# Patient Record
Sex: Female | Born: 1940 | Race: White | Hispanic: No | Marital: Single | Smoking: Never smoker
Health system: Southern US, Community
[De-identification: ages and names within clinical notes are randomized; demographics above are authoritative.]

## PROBLEM LIST (undated history)

## (undated) DIAGNOSIS — I10 Essential (primary) hypertension: Secondary | ICD-10-CM

## (undated) DIAGNOSIS — Z862 Personal history of diseases of the blood and blood-forming organs and certain disorders involving the immune mechanism: Secondary | ICD-10-CM

## (undated) DIAGNOSIS — F419 Anxiety disorder, unspecified: Secondary | ICD-10-CM

## (undated) DIAGNOSIS — M1990 Unspecified osteoarthritis, unspecified site: Secondary | ICD-10-CM

## (undated) DIAGNOSIS — N751 Abscess of Bartholin's gland: Secondary | ICD-10-CM

## (undated) DIAGNOSIS — E7800 Pure hypercholesterolemia, unspecified: Secondary | ICD-10-CM

## (undated) HISTORY — PX: BACK SURGERY: SHX140

## (undated) HISTORY — DX: Abscess of Bartholin's gland: N75.1

## (undated) HISTORY — DX: Unspecified osteoarthritis, unspecified site: M19.90

## (undated) HISTORY — DX: Anxiety disorder, unspecified: F41.9

## (undated) HISTORY — PX: BREAST EXCISIONAL BIOPSY: SUR124

## (undated) HISTORY — PX: BREAST SURGERY: SHX581

## (undated) HISTORY — PX: EYE SURGERY: SHX253

## (undated) HISTORY — PX: DIAGNOSTIC LAPAROSCOPY: SUR761

## (undated) HISTORY — DX: Pure hypercholesterolemia, unspecified: E78.00

## (undated) HISTORY — PX: COLONOSCOPY: SHX174

## (undated) HISTORY — PX: BUNIONECTOMY: SHX129

## (undated) HISTORY — PX: TONSILLECTOMY AND ADENOIDECTOMY: SUR1326

## (undated) HISTORY — DX: Essential (primary) hypertension: I10

## (undated) HISTORY — PX: CHOLECYSTECTOMY: SHX55

---

## 1969-06-06 HISTORY — PX: OTHER SURGICAL HISTORY: SHX169

## 1998-12-14 ENCOUNTER — Ambulatory Visit (HOSPITAL_COMMUNITY): Admission: RE | Admit: 1998-12-14 | Discharge: 1998-12-14 | Payer: Self-pay | Admitting: Internal Medicine

## 1999-01-27 ENCOUNTER — Ambulatory Visit (HOSPITAL_COMMUNITY): Admission: RE | Admit: 1999-01-27 | Discharge: 1999-01-27 | Payer: Self-pay | Admitting: Internal Medicine

## 1999-05-21 ENCOUNTER — Ambulatory Visit (HOSPITAL_COMMUNITY): Admission: RE | Admit: 1999-05-21 | Discharge: 1999-05-21 | Payer: Self-pay | Admitting: Gastroenterology

## 1999-11-30 ENCOUNTER — Encounter: Admission: RE | Admit: 1999-11-30 | Discharge: 1999-11-30 | Payer: Self-pay | Admitting: Obstetrics and Gynecology

## 1999-11-30 ENCOUNTER — Encounter: Payer: Self-pay | Admitting: Obstetrics and Gynecology

## 2000-12-01 ENCOUNTER — Encounter: Admission: RE | Admit: 2000-12-01 | Discharge: 2000-12-01 | Payer: Self-pay | Admitting: Obstetrics and Gynecology

## 2000-12-01 ENCOUNTER — Encounter: Payer: Self-pay | Admitting: Obstetrics and Gynecology

## 2000-12-08 ENCOUNTER — Encounter: Payer: Self-pay | Admitting: Obstetrics and Gynecology

## 2000-12-08 ENCOUNTER — Encounter: Admission: RE | Admit: 2000-12-08 | Discharge: 2000-12-08 | Payer: Self-pay | Admitting: Obstetrics and Gynecology

## 2000-12-11 ENCOUNTER — Encounter: Payer: Self-pay | Admitting: Obstetrics and Gynecology

## 2000-12-11 ENCOUNTER — Encounter: Admission: RE | Admit: 2000-12-11 | Discharge: 2000-12-11 | Payer: Self-pay | Admitting: Obstetrics and Gynecology

## 2001-11-19 ENCOUNTER — Encounter: Admission: RE | Admit: 2001-11-19 | Discharge: 2001-11-19 | Payer: Self-pay | Admitting: Family Medicine

## 2001-11-19 ENCOUNTER — Encounter: Payer: Self-pay | Admitting: Family Medicine

## 2002-02-25 ENCOUNTER — Encounter: Admission: RE | Admit: 2002-02-25 | Discharge: 2002-04-02 | Payer: Self-pay | Admitting: Orthopedic Surgery

## 2002-02-27 ENCOUNTER — Encounter: Admission: RE | Admit: 2002-02-27 | Discharge: 2002-02-27 | Payer: Self-pay | Admitting: Obstetrics and Gynecology

## 2002-02-27 ENCOUNTER — Encounter: Payer: Self-pay | Admitting: Obstetrics and Gynecology

## 2003-04-15 ENCOUNTER — Encounter: Admission: RE | Admit: 2003-04-15 | Discharge: 2003-04-15 | Payer: Self-pay | Admitting: Obstetrics and Gynecology

## 2004-09-30 ENCOUNTER — Encounter: Admission: RE | Admit: 2004-09-30 | Discharge: 2004-09-30 | Payer: Self-pay | Admitting: Obstetrics and Gynecology

## 2005-06-21 ENCOUNTER — Ambulatory Visit (HOSPITAL_COMMUNITY): Admission: RE | Admit: 2005-06-21 | Discharge: 2005-06-21 | Payer: Self-pay | Admitting: Neurological Surgery

## 2005-12-02 ENCOUNTER — Encounter: Admission: RE | Admit: 2005-12-02 | Discharge: 2005-12-02 | Payer: Self-pay | Admitting: Obstetrics and Gynecology

## 2006-09-19 ENCOUNTER — Ambulatory Visit (HOSPITAL_COMMUNITY): Admission: RE | Admit: 2006-09-19 | Discharge: 2006-09-19 | Payer: Self-pay | Admitting: Gastroenterology

## 2006-10-16 ENCOUNTER — Ambulatory Visit (HOSPITAL_COMMUNITY): Admission: RE | Admit: 2006-10-16 | Discharge: 2006-10-16 | Payer: Self-pay | Admitting: Internal Medicine

## 2006-11-17 ENCOUNTER — Observation Stay (HOSPITAL_COMMUNITY): Admission: EM | Admit: 2006-11-17 | Discharge: 2006-11-18 | Payer: Self-pay | Admitting: Emergency Medicine

## 2006-12-01 ENCOUNTER — Ambulatory Visit: Payer: Self-pay | Admitting: Cardiology

## 2006-12-01 ENCOUNTER — Inpatient Hospital Stay (HOSPITAL_COMMUNITY): Admission: EM | Admit: 2006-12-01 | Discharge: 2006-12-03 | Payer: Self-pay | Admitting: Emergency Medicine

## 2006-12-01 ENCOUNTER — Encounter (INDEPENDENT_AMBULATORY_CARE_PROVIDER_SITE_OTHER): Payer: Self-pay | Admitting: Surgery

## 2007-04-25 ENCOUNTER — Ambulatory Visit (HOSPITAL_COMMUNITY): Admission: RE | Admit: 2007-04-25 | Discharge: 2007-04-25 | Payer: Self-pay | Admitting: Neurological Surgery

## 2007-06-21 ENCOUNTER — Encounter: Admission: RE | Admit: 2007-06-21 | Discharge: 2007-06-21 | Payer: Self-pay | Admitting: Obstetrics and Gynecology

## 2007-08-28 ENCOUNTER — Ambulatory Visit (HOSPITAL_COMMUNITY): Admission: RE | Admit: 2007-08-28 | Discharge: 2007-08-28 | Payer: Self-pay | Admitting: Orthopedic Surgery

## 2008-02-21 ENCOUNTER — Encounter: Admission: RE | Admit: 2008-02-21 | Discharge: 2008-04-17 | Payer: Self-pay | Admitting: Orthopedic Surgery

## 2008-02-25 ENCOUNTER — Encounter: Admission: RE | Admit: 2008-02-25 | Discharge: 2008-02-25 | Payer: Self-pay | Admitting: Neurological Surgery

## 2008-04-07 ENCOUNTER — Encounter: Admission: RE | Admit: 2008-04-07 | Discharge: 2008-04-07 | Payer: Self-pay | Admitting: Neurological Surgery

## 2008-06-06 HISTORY — PX: OTHER SURGICAL HISTORY: SHX169

## 2008-06-23 ENCOUNTER — Encounter: Admission: RE | Admit: 2008-06-23 | Discharge: 2008-06-23 | Payer: Self-pay | Admitting: Neurological Surgery

## 2008-07-18 ENCOUNTER — Ambulatory Visit (HOSPITAL_COMMUNITY): Admission: RE | Admit: 2008-07-18 | Discharge: 2008-07-18 | Payer: Self-pay | Admitting: Orthopedic Surgery

## 2008-08-28 ENCOUNTER — Encounter: Admission: RE | Admit: 2008-08-28 | Discharge: 2008-08-28 | Payer: Self-pay | Admitting: Neurological Surgery

## 2008-09-24 ENCOUNTER — Ambulatory Visit (HOSPITAL_BASED_OUTPATIENT_CLINIC_OR_DEPARTMENT_OTHER): Admission: RE | Admit: 2008-09-24 | Discharge: 2008-09-24 | Payer: Self-pay | Admitting: Orthopedic Surgery

## 2008-10-10 ENCOUNTER — Ambulatory Visit (HOSPITAL_COMMUNITY): Admission: RE | Admit: 2008-10-10 | Discharge: 2008-10-10 | Payer: Self-pay | Admitting: Neurological Surgery

## 2008-11-17 ENCOUNTER — Encounter: Admission: RE | Admit: 2008-11-17 | Discharge: 2008-11-17 | Payer: Self-pay | Admitting: Neurological Surgery

## 2008-11-18 ENCOUNTER — Encounter: Admission: RE | Admit: 2008-11-18 | Discharge: 2008-11-18 | Payer: Self-pay | Admitting: Neurological Surgery

## 2009-02-05 ENCOUNTER — Encounter: Admission: RE | Admit: 2009-02-05 | Discharge: 2009-02-05 | Payer: Self-pay | Admitting: Neurological Surgery

## 2009-05-04 ENCOUNTER — Encounter: Admission: RE | Admit: 2009-05-04 | Discharge: 2009-05-04 | Payer: Self-pay | Admitting: Neurological Surgery

## 2009-06-06 HISTORY — PX: OTHER SURGICAL HISTORY: SHX169

## 2009-06-06 HISTORY — PX: LUMBAR FUSION: SHX111

## 2009-06-30 ENCOUNTER — Encounter: Admission: RE | Admit: 2009-06-30 | Discharge: 2009-06-30 | Payer: Self-pay | Admitting: Neurological Surgery

## 2009-08-02 ENCOUNTER — Ambulatory Visit (HOSPITAL_COMMUNITY): Admission: RE | Admit: 2009-08-02 | Discharge: 2009-08-02 | Payer: Self-pay | Admitting: Neurosurgery

## 2009-08-25 ENCOUNTER — Encounter: Admission: RE | Admit: 2009-08-25 | Discharge: 2009-08-25 | Payer: Self-pay | Admitting: Neurological Surgery

## 2009-09-28 ENCOUNTER — Inpatient Hospital Stay (HOSPITAL_COMMUNITY): Admission: RE | Admit: 2009-09-28 | Discharge: 2009-10-02 | Payer: Self-pay | Admitting: Orthopedic Surgery

## 2009-11-23 ENCOUNTER — Ambulatory Visit (HOSPITAL_COMMUNITY): Admission: RE | Admit: 2009-11-23 | Discharge: 2009-11-23 | Payer: Self-pay | Admitting: Neurological Surgery

## 2010-01-07 ENCOUNTER — Encounter: Admission: RE | Admit: 2010-01-07 | Discharge: 2010-01-07 | Payer: Self-pay | Admitting: Obstetrics and Gynecology

## 2010-02-15 ENCOUNTER — Inpatient Hospital Stay (HOSPITAL_COMMUNITY): Admission: RE | Admit: 2010-02-15 | Discharge: 2010-02-23 | Payer: Self-pay | Admitting: Neurological Surgery

## 2010-06-27 ENCOUNTER — Encounter: Payer: Self-pay | Admitting: Gastroenterology

## 2010-06-27 ENCOUNTER — Encounter: Payer: Self-pay | Admitting: Internal Medicine

## 2010-08-19 LAB — BASIC METABOLIC PANEL
BUN: 18 mg/dL (ref 6–23)
BUN: 8 mg/dL (ref 6–23)
CO2: 27 mEq/L (ref 19–32)
CO2: 28 mEq/L (ref 19–32)
Calcium: 8.1 mg/dL — ABNORMAL LOW (ref 8.4–10.5)
Calcium: 9.6 mg/dL (ref 8.4–10.5)
Chloride: 103 mEq/L (ref 96–112)
Chloride: 108 mEq/L (ref 96–112)
Creatinine, Ser: 0.61 mg/dL (ref 0.4–1.2)
Creatinine, Ser: 0.69 mg/dL (ref 0.4–1.2)
GFR calc Af Amer: >60 mL/min (ref >60)
GFR calc Af Amer: >60 mL/min (ref >60)
GFR calc non Af Amer: >60 mL/min (ref >60)
GFR calc non Af Amer: >60 mL/min (ref >60)
Glucose, Bld: 119 mg/dL — ABNORMAL HIGH (ref 70–99)
Glucose, Bld: 95 mg/dL (ref 70–99)
Potassium: 3.8 mEq/L (ref 3.5–5.1)
Potassium: 3.8 mEq/L (ref 3.5–5.1)
Sodium: 137 mEq/L (ref 135–145)
Sodium: 140 mEq/L (ref 135–145)

## 2010-08-19 LAB — CBC
HCT: 25.1 % — ABNORMAL LOW (ref 36.0–46.0)
HCT: 25.2 % — ABNORMAL LOW (ref 36.0–46.0)
HCT: 27.5 % — ABNORMAL LOW (ref 36.0–46.0)
HCT: 38.6 % (ref 36.0–46.0)
Hemoglobin: 8.4 g/dL — ABNORMAL LOW (ref 12.0–15.0)
Hemoglobin: 12.7 g/dL (ref 12.0–15.0)
Hemoglobin: 8.4 g/dL — ABNORMAL LOW (ref 12.0–15.0)
Hemoglobin: 9.3 g/dL — ABNORMAL LOW (ref 12.0–15.0)
MCH: 28.9 pg (ref 26.0–34.0)
MCH: 28.5 pg (ref 26.0–34.0)
MCH: 29 pg (ref 26.0–34.0)
MCH: 29.4 pg (ref 26.0–34.0)
MCHC: 33.5 g/dL (ref 30.0–36.0)
MCHC: 32.9 g/dL (ref 30.0–36.0)
MCHC: 33.3 g/dL (ref 30.0–36.0)
MCHC: 33.8 g/dL (ref 30.0–36.0)
MCV: 86.3 fL (ref 78.0–100.0)
MCV: 85.7 fL (ref 78.0–100.0)
MCV: 86.5 fL (ref 78.0–100.0)
MCV: 88.1 fL (ref 78.0–100.0)
Platelets: 197 10*3/uL (ref 150–400)
Platelets: 153 10*3/uL (ref 150–400)
Platelets: 187 10*3/uL (ref 150–400)
Platelets: 241 10*3/uL (ref 150–400)
RBC: 2.91 MIL/uL — ABNORMAL LOW (ref 3.87–5.11)
RBC: 2.86 MIL/uL — ABNORMAL LOW (ref 3.87–5.11)
RBC: 3.21 MIL/uL — ABNORMAL LOW (ref 3.87–5.11)
RBC: 4.46 MIL/uL (ref 3.87–5.11)
RDW: 14.5 % (ref 11.5–15.5)
RDW: 14.1 % (ref 11.5–15.5)
RDW: 14.4 % (ref 11.5–15.5)
RDW: 14.7 % (ref 11.5–15.5)
WBC: 11 10*3/uL — ABNORMAL HIGH (ref 4.0–10.5)
WBC: 10.8 10*3/uL — ABNORMAL HIGH (ref 4.0–10.5)
WBC: 11 10*3/uL — ABNORMAL HIGH (ref 4.0–10.5)
WBC: 5.6 10*3/uL (ref 4.0–10.5)

## 2010-08-19 LAB — TYPE AND SCREEN
ABO/RH(D): O NEG
Antibody Screen: NEGATIVE

## 2010-08-19 LAB — ABO/RH: ABO/RH(D): O NEG

## 2010-08-19 LAB — POCT I-STAT 4, (NA,K, GLUC, HGB,HCT)
Glucose, Bld: 164 mg/dL — ABNORMAL HIGH (ref 70–99)
HCT: 28 % — ABNORMAL LOW (ref 36.0–46.0)
Hemoglobin: 9.5 g/dL — ABNORMAL LOW (ref 12.0–15.0)
Potassium: 3.2 mEq/L — ABNORMAL LOW (ref 3.5–5.1)
Sodium: 143 mEq/L (ref 135–145)

## 2010-08-19 LAB — SURGICAL PCR SCREEN
MRSA, PCR: NEGATIVE
Staphylococcus aureus: NEGATIVE

## 2010-08-24 LAB — COMPREHENSIVE METABOLIC PANEL
ALT: 14 U/L (ref 0–35)
AST: 20 U/L (ref 0–37)
Albumin: 3.6 g/dL (ref 3.5–5.2)
Alkaline Phosphatase: 101 U/L (ref 39–117)
BUN: 19 mg/dL (ref 6–23)
CO2: 29 mEq/L (ref 19–32)
Calcium: 9.2 mg/dL (ref 8.4–10.5)
Chloride: 107 mEq/L (ref 96–112)
Creatinine, Ser: 0.62 mg/dL (ref 0.4–1.2)
GFR calc Af Amer: >60 mL/min (ref >60)
GFR calc non Af Amer: >60 mL/min (ref >60)
Glucose, Bld: 130 mg/dL — ABNORMAL HIGH (ref 70–99)
Potassium: 3.9 mEq/L (ref 3.5–5.1)
Sodium: 141 mEq/L (ref 135–145)
Total Bilirubin: 0.2 mg/dL — ABNORMAL LOW (ref 0.3–1.2)
Total Protein: 7.1 g/dL (ref 6.0–8.3)

## 2010-08-24 LAB — BASIC METABOLIC PANEL
BUN: 10 mg/dL (ref 6–23)
BUN: 6 mg/dL (ref 6–23)
CO2: 25 mEq/L (ref 19–32)
CO2: 27 mEq/L (ref 19–32)
Calcium: 8.1 mg/dL — ABNORMAL LOW (ref 8.4–10.5)
Calcium: 8.3 mg/dL — ABNORMAL LOW (ref 8.4–10.5)
Chloride: 100 mEq/L (ref 96–112)
Chloride: 104 mEq/L (ref 96–112)
Creatinine, Ser: 0.58 mg/dL (ref 0.4–1.2)
Creatinine, Ser: 0.68 mg/dL (ref 0.4–1.2)
GFR calc Af Amer: >60 mL/min (ref >60)
GFR calc Af Amer: >60 mL/min (ref >60)
GFR calc non Af Amer: >60 mL/min (ref >60)
GFR calc non Af Amer: >60 mL/min (ref >60)
Glucose, Bld: 124 mg/dL — ABNORMAL HIGH (ref 70–99)
Glucose, Bld: 145 mg/dL — ABNORMAL HIGH (ref 70–99)
Potassium: 3.3 mEq/L — ABNORMAL LOW (ref 3.5–5.1)
Potassium: 3.6 mEq/L (ref 3.5–5.1)
Sodium: 134 mEq/L — ABNORMAL LOW (ref 135–145)
Sodium: 134 mEq/L — ABNORMAL LOW (ref 135–145)

## 2010-08-24 LAB — CBC
HCT: 24.7 % — ABNORMAL LOW (ref 36.0–46.0)
HCT: 26 % — ABNORMAL LOW (ref 36.0–46.0)
HCT: 27.3 % — ABNORMAL LOW (ref 36.0–46.0)
HCT: 36.8 % (ref 36.0–46.0)
Hemoglobin: 12.3 g/dL (ref 12.0–15.0)
Hemoglobin: 8.3 g/dL — ABNORMAL LOW (ref 12.0–15.0)
Hemoglobin: 8.9 g/dL — ABNORMAL LOW (ref 12.0–15.0)
Hemoglobin: 9.2 g/dL — ABNORMAL LOW (ref 12.0–15.0)
MCHC: 33.5 g/dL (ref 30.0–36.0)
MCHC: 33.6 g/dL (ref 30.0–36.0)
MCHC: 33.8 g/dL (ref 30.0–36.0)
MCHC: 34.4 g/dL (ref 30.0–36.0)
MCV: 91.3 fL (ref 78.0–100.0)
MCV: 91.5 fL (ref 78.0–100.0)
MCV: 91.7 fL (ref 78.0–100.0)
MCV: 91.9 fL (ref 78.0–100.0)
Platelets: 176 10*3/uL (ref 150–400)
Platelets: 181 10*3/uL (ref 150–400)
Platelets: 183 10*3/uL (ref 150–400)
Platelets: 249 10*3/uL (ref 150–400)
RBC: 2.69 MIL/uL — ABNORMAL LOW (ref 3.87–5.11)
RBC: 2.85 MIL/uL — ABNORMAL LOW (ref 3.87–5.11)
RBC: 2.98 MIL/uL — ABNORMAL LOW (ref 3.87–5.11)
RBC: 4.03 MIL/uL (ref 3.87–5.11)
RDW: 14.2 % (ref 11.5–15.5)
RDW: 14.3 % (ref 11.5–15.5)
RDW: 14.5 % (ref 11.5–15.5)
RDW: 14.6 % (ref 11.5–15.5)
WBC: 10.3 10*3/uL (ref 4.0–10.5)
WBC: 10.7 10*3/uL — ABNORMAL HIGH (ref 4.0–10.5)
WBC: 6.2 10*3/uL (ref 4.0–10.5)
WBC: 8.6 10*3/uL (ref 4.0–10.5)

## 2010-08-24 LAB — APTT: aPTT: 25 seconds (ref 24–37)

## 2010-08-24 LAB — URINE MICROSCOPIC-ADD ON

## 2010-08-24 LAB — TYPE AND SCREEN
ABO/RH(D): O NEG
Antibody Screen: NEGATIVE

## 2010-08-24 LAB — PROTIME-INR
INR: 1.07 (ref 0.00–1.49)
INR: 1.17 (ref 0.00–1.49)
INR: 1.4 (ref 0.00–1.49)
INR: 1.42 (ref 0.00–1.49)
INR: 1.54 — ABNORMAL HIGH (ref 0.00–1.49)
Prothrombin Time: 13.8 seconds (ref 11.6–15.2)
Prothrombin Time: 14.8 seconds (ref 11.6–15.2)
Prothrombin Time: 17 seconds — ABNORMAL HIGH (ref 11.6–15.2)
Prothrombin Time: 17.2 seconds — ABNORMAL HIGH (ref 11.6–15.2)
Prothrombin Time: 18.4 seconds — ABNORMAL HIGH (ref 11.6–15.2)

## 2010-08-24 LAB — URINALYSIS, ROUTINE W REFLEX MICROSCOPIC
Bilirubin Urine: NEGATIVE
Glucose, UA: NEGATIVE mg/dL
Leukocytes, UA: NEGATIVE
Nitrite: NEGATIVE
Protein, ur: NEGATIVE mg/dL
Specific Gravity, Urine: 1.027 (ref 1.005–1.030)
Urobilinogen, UA: 0.2 mg/dL (ref 0.0–1.0)
pH: 5 (ref 5.0–8.0)

## 2010-08-24 LAB — ABO/RH: ABO/RH(D): O NEG

## 2010-09-15 LAB — POCT I-STAT 4, (NA,K, GLUC, HGB,HCT)
Glucose, Bld: 88 mg/dL (ref 70–99)
HCT: 38 % (ref 36.0–46.0)
Hemoglobin: 12.9 g/dL (ref 12.0–15.0)
Potassium: 3.7 mEq/L (ref 3.5–5.1)
Sodium: 141 mEq/L (ref 135–145)

## 2010-10-19 NOTE — H&P (Signed)
[name], [place] NO.:  [id number]   MEDICAL RECORD NO.:  [id number]          PATIENT TYPE:  EMS   LOCATION:  MAJO                         FACILITY:  [facility]   PHYSICIAN:  Lyn Records, M.D.   DATE OF BIRTH:  January 28, 1941   DATE OF ADMISSION:  11/30/2006  DATE OF DISCHARGE:                              HISTORY & PHYSICAL   ADMITTING CARDIOLOGIST:  Francisca December, M.D.   The patient is full code.  The patient total time approximately 48  minutes.   PRIMARY CARE PHYSICIAN:  Geoffry Paradise, M.D.   OB/GYN:  Rica Koyanagi, C.N.M.   The patient was a good historian.   CHIEF COMPLAINT:  Dizziness.   HISTORY OF PRESENT ILLNESS:  The patient is a 70 year old female with a  history of presyncopal events for many years and with recent symptoms  including approximately in April of 2008 apparent presyncopal syncopal  episode after a bowel movement.  She felt flushed and sweaty and her  blood pressure was 92/40 when she checked.  The patient had another  episode approximately around June 13 to November 18, 2006, which required  admission.  At that time she had a bowel movement and urination which  caused nausea, flushing, sweatiness, and presyncopal episode.  The  patient apparent fell and hit her left eye on a drawer at that time.  She then had another episode shortly thereafter after eating a banana.  During her admission, she had a CT of the head which was negative,  telemetry was negative, and EKG was unremarkable.  She had a two-  dimensional echocardiogram showing ejection fraction of 70% with normal  LV thickness.  She had no valvular abnormalities.  There was mild  thickening of the aortic valve.  The cardiac markers were unremarkable.  The plan was to discontinue the hydrochlorothiazide.  She was set up for  an ambulatory monitor which she still has.  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 feeling significant numbness in her right and left arm, the  left arm initially.  She apparently has a history of left arm numbness  with her episodes with the right arm numbness she notes was new which  concerned her significantly.  She also began feeling very dizzy and  lightheaded.  Her blood pressure she noted was 180/120.  Her symptoms  lasted up until the time of the interview here in the emergency room  which was approximately 11:30 p.m. on the night of admission.  The  patient also notes lower abdominal pain over the last several weeks  which has been worsening.  It is colicky bilateral lower quadrant,  intermittent, and worse with eating.  She has not been eating much at  all on detailed questioning, eating less than a good full meal a day,  and has been drinking about one 8-ounce glass total of liquid a day. The  patient notes that when she eats, she has significant pain which may be  the reason why  she is not eating.  She has had persistent waves of  severe nausea over the last week, severely so and she had emesis a week  ago.  No fevers, no dysuria, no cough.  She denies any chest pain.  She  denies any lower extremity edema, paroxysmal nocturnal dyspnea, or  orthopnea.  The patient in the emergency room was given IV bolus of  fluids.  She already had about 500 mL in.  She was also given Zofran,  potassium chloride at 10 mEq IV, and potassium chloride tablets 40.  She  was also given IV Dilaudid 2 mg IV.   PAST MEDICAL HISTORY:  History of hypertension, history of presyncopal  symptoms.  She was diagnosed with vasovagal syncope, presyncope on the  last hospitalization mentioned earlier part of June.  She has a history  of a breast biopsy that was negative.  She is status post tonsillectomy,  adenectomy, and the patient also has known gallstones.  She has had a  cystoplasty.   MEDICATIONS:  1. Enalapril 20 mg by mouth daily.  2. Hydrochlorothiazide 25 mg by mouth  daily as she did not discontinue      the drug.  3. Mobic 7.5 mg daily.  4. Protonix 40 mg p.o. b.i.d. over the last week.  She has just      recently started Protonix.   SOCIAL HISTORY:  The patient is not married and has no children.  She  does not smoke, drink, and she denies illicit drug use.  She is an LPN  here.   FAMILY HISTORY:  Positive for a mother with dementia, history of  strokes, rheumatoid arthritis, hypertension, and spinal stenosis.   REVIEW OF SYSTEMS:  10-point review of systems is negative unless stated  above.   PHYSICAL EXAMINATION:  VITAL SIGNS:  Temperature 97.6, pulse initially  105 and decreased to 76, respiratory rate 14-20, blood pressure 154/84,  saturations 99% on room air.  GENERAL:  She is a well-developed female lying in bed in no acute  distress.  HEENT:  Pupils equal, round, and reactive to light.  Extraocular  movements are intact.  Her oropharynx is significantly dry with no  posterior oropharyngeal lesions.  NECK:  Supple with no lymphadenopathy or thyromegaly.  No carotid  bruits.  No jugular venous distention.  She has a hematoma around her  eye on the left.  LUNGS:  Clear to auscultation bilaterally.  HEART:  Regular rate and rhythm with no murmurs, rubs, or gallops.  Normal S1 and S2.  No S3 or S4 is noted.  No heaves.  ABDOMEN:  She has no abdominal bruits.  The abdomen is soft, but there  is significant tenderness bilateral lower quadrants and also right upper  quadrant with a positive Murphy's sign.  EXTREMITIES:  She has no cyanosis, clubbing, or edema.  NEUROLOGY:  She is alert and oriented x4.  Cranial nerves II-XII grossly  intact.  Strength and sensation grossly intact.  SKIN:  No rashes or lesions.  MUSCULOSKELETAL:  No joint deformities or effusions.   Chest x-ray was negative.  There is no acute cardiopulmonary disease.  EKG shows normal sinus rhythm at 79 rate with normal axis, normal  intervals, with PR interval 142, QRS  92, and QT corrected at 401.  Nonspecific ST-T changes with no significant change from EKG November 16, 2006.   LABORATORY DATA:  White count 6.2, hemoglobin 11.1, hematocrit 32.8,  platelets 259, MCV 86.6.  Sodium 143, potassium  2.8, chloride 109,  bicarb 24, BUN 10, creatinine 0.69, glucose 94.  AST 27, ALT 23, lipase  18, total protein 6.7, albumin 3.6.  Total bilirubin 0.8, alkaline  phosphatase 72, calcium 9.4.  Urinalysis is unremarkable except for 15  ketones, leukocyte esterase small, hemoglobin trace, white blood cells 0-  2, RBC's 0-2, pH 7, bilirubin negative, urobilinogen negative.   ASSESSMENT:  This is a patient with a history of possible presyncope  syncopal vasovagal syndrome that is now apparently worsened by  hypovolemia.  The hypovolemia is probably from anorexia and anorexia is  probably from gallstones or possible cholelithiasis.  The patient has  had hypokalemia that is profound.  She also has ketones.  She  unfortunately is still on hydrochlorothiazide.  For hypovolemia we will  check orthostatics now, hold her hydrochlorothiazide, and will  aggressively hydrate her.  We will also address her possible gallbladder  disease.   For her gallbladder disease, we will get a surgical consult considering  whether or not she may need the surgery sooner rather than later along  with continued IV fluids and have her NPO for now.  We will give her  antiemetics and analgesics as needed.  For her abnormal urinalysis, we  will get a urine culture.  For her hypokalemia, we will replete.  We  will give her an additional 40 mEq tablets now.  For her hypertension,  we will continue Vasotec and control her pain to see if this improves  her blood pressure and hydrochlorothiazide will be held.  DVT  prophylaxis with enoxaparin,  GI prophylaxis with IV Protonix 40 mg IV  b.i.d.  Cardiovascular, we will place her on telemetry and check cardiac  markers x3.      Darryl D. Prime, MD    Electronically Signed     ______________________________  Lyn Records, M.D.    DDP/MEDQ  D:  12/01/2006  T:  12/01/2006  Job:  [id number]

## 2010-10-19 NOTE — Discharge Summary (Signed)
[name], [name]                ACCOUNT NO.:  [id number]   MEDICAL RECORD NO.:  [id number]          PATIENT TYPE:  OBV   LOCATION:  6526                         FACILITY:  [facility]   PHYSICIAN:  Gwen Pounds, MD       DATE OF BIRTH:  1940/10/31   DATE OF ADMISSION:  11/17/2006  DATE OF DISCHARGE:  11/18/2006                               DISCHARGE SUMMARY   PRIMARY CARE PHYSICIAN:  Geoffry Paradise, M.D.   DISCHARGE DIAGNOSES:  1. Syncope, presumed vasovagal.  2. Recurrent presyncopal events.  3. Known gallstones.  4. Hypertension.   DISCHARGE MEDICATIONS:  1. Prempro 0.45/1.5 daily.  2. Vasotek 20 mg daily.  3. Aspirin 81 q. A.m.  4. Tylenol as needed.  5. Mobic 15 mg as directed.  6. She is to HOLD her hydrochlorothiazide.   PROCEDURE:  1. Cranial CT, revealing no acute intracranial abnormality, left      periorbital swelling, and fluid in her right division of the      sphenoid sinus presumed to be blood and no fractures were noted.  2. Telemetry monitoring without any underlying arrhythmias.  3. EKG showed normal sinus rhythm.  4. Blood working ruling out myocardial infarction and normal blood      work.  5. A 2-D echocardiogram unremarkable.  6. Orthostatic blood pressures were negative.   HISTORY OF PRESENT ILLNESS:  This is a 70 year old female, otherwise  healthy, who is on Vasotec and hydrochlorothiazide for hypertension, who  has had some presyncopal events over the last couple of years. These  have been relatively unremarkable. About 3 months ago while at  [place], after using the facility, she got flushed, sweaty, and light  headed. She sat down and rested and was attended to by the nurses over  at [place] and her blood pressure at the time was 92/40. She drank  water, rested and got better. Everything was fine until this week on  Tuesday p.m., when she went to the bathroom and she got a sickish  feeling, pre-syncopal, flushed, sweaty, and felt like she  had to get up  and go lie down on her bed. On the way, she fell, hit her face and eye  on the floor and near the edge of a drawer and bruised up her knee as  well. She got a black eye. About 3 to 4 minutes later, she got better.  On November 17, 2006, while at work at [employer], she again got  similar feelings with a wave of nausea, light headedness, weakness. She  rushed down to the cafeteria and got a banana and felt that she needed  to lie down. She noticed that she had some increased jitteriness. 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 In the emergency department, IV fluids were give. Laboratory  data was checked. Cranial CT was checked. Everything was unremarkable.  They called me for inpatient admission. Her exam was unremarkable. Her  labs are unremarkable. Her  cranial CT is reported above.   HOSPITAL COURSE:  [name] was admitted on November 17, 2006 with pre-  syncope and syncope without obvious cause. There were no neurological  findings and no cardiac findings. It was felt that we should admit her  for 23 hour observation to telemetry to rule out arrhythmia, repeat a 2-  D echocardiogram, hold her hydrochlorothiazide, rule out myocardial  infarction, and have cardiology see her in the morning and decide on  what the next step would be. The following morning a 2-D echocardiogram  was done. Cardiology came by, Dr. Amil Amen and his impression was syncope  and pre-syncope with a prodrome and the associated symptoms suggest that  this is a vasovagal reaction. He is worried as I am that there might be  some hypotension, especially during these events. Therefore, his  hydrochlorothiazide would be discontinued. She does have known  cholelithiasis and this is currently being worked up and she is going to  be seeing Dr. Gerrit Friends as an outpatient in July. She is  encouraged to  continue to go to that appointment. Dr. Amil Amen recommendation was that  the 2-D echocardiogram preliminary report was normal. Myocardial  infarction was ruled out. He is going to officially view the  echocardiogram himself and has given the pateint instructions to go to  the office on November 20, 2006 for 24 hour ambulatory monitor. The patient  is to call to arrange that. He also made an addendum saying that since  she has had intense vagal prodrome x2 and has known gallstones, a  reassessment of her gallbladder may be appropriate to determine if there  is any silent spilling passage. This will be evaluated as an outpatient  and we will decide as to whether we should do this or not. On November 18, 2006, I saw the patient and again, she ruled out for myocardial  infarction. She is not orthostatic. She did not have any arrhythmias on  telemetry. Her labs are fine. Her 2-D echocardiogram was negative. This  is presumed vasovagal syncope. We are going to hold hydrochlorothiazide  and we will transition her to an outpatient. I had initially thought  about doing a carotid Doppler and ultrasound but at this point in time,  she dose not have any underlying bruits and no neurological complaints  and nothing on CT to suggest that this was a stroke. Therefore, I think  we can just follow this and maybe do this as an outpatient if felt  necessary.      Gwen Pounds, MD  Electronically Signed     JMR/MEDQ  D:  11/18/2006  T:  11/18/2006  Job:  [id number]   cc:   Geoffry Paradise, M.D.  Francisca December, M.D.  Lyn Records, M.D.  Velora Heckler, MD

## 2010-10-19 NOTE — H&P (Signed)
NAMEMARQUISA, [name] NO.:  [id number]   MEDICAL RECORD NO.:  [id number]          PATIENT TYPE:  OBV   LOCATION:  6526                         FACILITY:  [facility]   PHYSICIAN:  Gwen Pounds, MD       DATE OF BIRTH:  1940-11-13   DATE OF ADMISSION:  11/17/2006  DATE OF DISCHARGE:                              HISTORY & PHYSICAL   PRIMARY CARE PHYSICIAN:  Dr. Eber Hong   CARDIOLOGIST:  Lyn Records, M.D.   OB/GYN:  Rica Koyanagi, C.N.M.   CHIEF COMPLAINT:  Syncope, presyncope.   HISTORY OF PRESENT ILLNESS:  This 70 year old female who takes  hydrochlorothiazide for hypertension, she has had some presyncopal  events over the past several years.  About 3 months ago, while having a  bowel movements, she got flushed, sweaty, and lightheaded.  She lied  down, rested because this was happening in a nursing facility where she  was visiting her mom.  A nurse checked her blood pressure and it was  92/40.  She drank water, rested, and got better.  Everything has been  fine up until this week.  On Tuesday at 8 p.m. she went to the bathroom  and had a bowel movements and urinated and she got sickish,  presyncopal, flushed, sweaty.  She got up and on the way to lie down  fell and hit the floor and bumped her left eye and face on a drawer and  got a black eye.  Three to four minutes later she kind of got better,  came to, and rested until she felt she was able to get up and then used  ice.  She has been better until today around lunch time while at work,  she had a wave of nausea and felt she needed to go to [employer]  where she got a banana.  She got lightheaded, weak, nauseated, and had  to lie down.  She also got some jitteriness.  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  In the ED, IV fluids were given, all labs, EKG, and cranial  CTs were checked and were fine.  I was called for patient  admission.   PAST MEDICAL HISTORY:  1. Hypertension.  2. History of normal 2D echocardiogram 5 or 6 years ago.  3. Breast biopsy was negative.   PAST SURGICAL HISTORY:  1. Tonsil and adenoidectomy.  2. Cystoplasty.  3. Known gallstones.   MEDICATIONS:  1. Vasotec.  2. Hydrochlorothiazide.  3. Prempro.   ALLERGIES:  NO KNOWN DRUG ALLERGIES.   SOCIAL HISTORY:  She is a neuro LPN in the operating room.  She does not  smoke, she does not drink.  She is not married, no children.   FAMILY HISTORY:  Mother has dementia, CVA, rheumatoid arthritis,  hypertension, and spinal stenosis.   REVIEW OF SYSTEMS:  She denies any chest pain, no shortness of breath,  or any underlying events prior to any of these syncopal events.  She was  nauseated with  the last syncopal event, but without vomiting.  She has  been having presyncope and syncope as described in the HPI.  The only  pain that she has is pain above her left eye.  There is no melena, no  palpitations.  No other GI symptoms.  No other urinary symptoms.  She  did have a colonoscopy, which was fine.  No recent dehydration and no  known seizures.  No weakness and there is no neurologic complaints.   PHYSICAL EXAMINATION:  VITAL SIGNS:  Blood pressure 126/80 up to 156/90.  Heart rate 78 to 98.  Saturating 100% on room air, respiratory rate is  16, temperature 98.1, and blood sugar 95.  While lying her blood  pressure was 146/76 with a heart rate of 78.  While sitting 156/90 with  heart rate of 84.  While standing up 138/89 with a heart rate of 98.  GENERAL:  She is alert and oriented x3.  Her face has a left eye which  is black and blue, ecchymotic with swelling.  HEENT:  Extraocular movements are intact.  PERRL.  Sclerae not affected.  Oropharynx is moist.  NECK:  No JVD.  No bruits.  CARDIAC:  Regular without murmurs.  PULMONARY:  Clear to auscultation bilaterally.  ABDOMEN:  Soft.  EXTREMITIES:  Right knee is bruised.   NEUROLOGIC:  Intact.   LABORATORY DATA:  Sodium 137, potassium 3.5, chloride 104, bicarb 29,  BUN 11, creatinine 0.7, chloride 102.  White count 5.1, hemoglobin 12.5,  platelet count 298.   EKG shows normal sinus rhythm.  A cranial CT shows no acute intracranial  abnormality, left periorbital swelling, dependent fluid is the right  division of the sphenoid sinus.  No fractures were noted.   ASSESSMENT:  This is a healthy lady on 2 medications for hypertension  being admitted with presyncope today and syncope on Tuesday nights that  lead to a large left black eye.  There is no obvious cause at this  current time.  She is neurologically intact and without any cardiac  findings.   PLAN:  1. We will admit for 23 hour observation.  2. We will keep her on telemetry to rule out underlying arrhythmia.  3. More repeated CT echocardiograms as figured out to make sure      nothing significant going on heart wise or valvular issues.  4. Cardiology is aware of the patient and we will make sure an      echocardiogram gets done tomorrow and read.  5. We will discontinue hydrochlorothiazide at this current time.  She      does not have any electrolyte abnormalities, her potassium is 3.5,      but we will follow it carefully.  6. We will check CK and troponin I to rule out myocardial infarction.  7. We will followup with labs in the morning.  8. Hold immunologic  workup unless cardiology workup negative, this      will include an MRI, MRA.  At this time, it would probably be safe      and appropriate for just one carotid Doppler.  9. We will recheck orthostatic's in the morning and followup      accordingly.      Gwen Pounds, MD  Electronically Signed     JMR/MEDQ  D:  11/17/2006  T:  11/18/2006  Job:  [id number]   cc:   Sherrie Sport, M.D.  Rica Koyanagi, C.N.M.

## 2010-10-19 NOTE — Op Note (Signed)
[name], [name]                ACCOUNT NO.:  [id number]   MEDICAL RECORD NO.:  [id number]          PATIENT TYPE:  AMB   LOCATION:  NESC                         [facility]ACILITY[facility]   PHYSICIAN:  Ollen Gross, M.D.    DATE OF BIRTH:  August 11, 1940   DATE OF PROCEDURE:  09/24/2008  DATE OF DISCHARGE:                               OPERATIVE REPORT   PREOPERATIVE DIAGNOSES:  1. Left knee medial meniscal tear.  2. Right knee arthritis.   POSTOPERATIVE DIAGNOSES:  1. Left knee medial meniscal tear plus lateral meniscal tear plus      chondral defect.  2. Right knee osteoarthritis.   PROCEDURES:  1. Left knee arthroscopy with medial and lateral meniscal debridement      and chondroplasty.  2. Cortisone injection right knee joint.   SURGEON:  F. Aluisio, M.D.   ASSISTANT:  None.   ANESTHESIA:  General.   BLOOD LOSS:  Minimal.   DRAINS:  None.   COMPLICATIONS:  None.   CONDITION:  Stable to recovery.   BRIEF CLINICAL NOTE:  [name] is a 70 year old female who sustained an  injury to her left knee a couple months ago.  She had significant pain  and mechanical symptoms.  exam and history were suggestive of medial  meniscal tear confirmed by MRI.  She presents now for arthroscopy and  debridement.   PROCEDURE IN DETAIL:  After successful administration of general  anesthetic, a tourniquet is placed high on the left thigh and left lower  extremity prepped and draped in the usual sterile fashion.  Standard  superomedial and inferolateral incisions made, inflow cannula passed  superomedial and camera passed inferolateral.  Arthroscopic  visualization proceeds.  The undersurface of the patella and trochlea  show some mild grade II changes but no full-thickness cartilage defects  and no unstable cartilage.  Mediolateral gutters were visualized and  there are no loose bodies.  Flexion and valgus force is applied to the  knee and the medial compartment is entered.  There is some  high-grade  chondromalacia on the medial femoral condyle.  There is also evidence of  a tear in the posterior horn of the medial meniscus.  A spinal needle is  used to localize the inferomedial portal, small incision made, dilator  placed and then the meniscus debrided back to a stable base with baskets  and a 4.2 mm shaver and sealed off with the ArthroCare device.  The  shaver is utilized to debride the unstable cartilage undersurface of the  medial femoral condyle down to a stable cartilaginous base.  This  cartilage is very thin and has an area of about 1 x 2 cm where the bone  is barely covered by cartilage.  The rest of the medial femoral condyle  looks fine.  The medial tibia plateau looked fine.  The intercondylar  notch is visualized and ACL is a little attenuated, but otherwise  normal.  The lateral compartment is entered and there is a bad tear in  the body and posterior horn of the lateral meniscus.  This is debrided  back to  a stable base with baskets and a 4.2 mm shaver and sealed off  with the ArthroCare device.  The joint again is inspected.  There are no  other tears or loose bodies noted.  The arthroscopic equipment is then  removed from the inferior portals which are closed with interrupted 4-0  nylon; 20 mL of 4% Marcaine with epi is injected through the inflow  cannula and that is removed in that portal and closed with nylon.  A  bulky sterile dressing is applied and then I thoroughly prepped the  right knee and injected with lidocaine and Depo-Medrol with no problems.  I used 6 mL of 1% lidocaine without epi and then 1 mL of Depo-Medrol.  She tolerated the procedures well and then was awakened and transported  to recovery in stable condition.      Ollen Gross, M.D.  Electronically Signed     FA/MEDQ  D:  09/24/2008  T:  09/24/2008  Job:  [id number]

## 2010-10-19 NOTE — H&P (Signed)
[name], BERDAN                ACCOUNT NO.:  [id number]   MEDICAL RECORD NO.:  [id number]          PATIENT TYPE:  OBV   LOCATION:  6526                         FACILITY:  [facility]   PHYSICIAN:  Gwen Pounds, MD       DATE OF BIRTH:  Feb 13, 1941   DATE OF ADMISSION:  11/17/2006  DATE OF DISCHARGE:  11/18/2006                              HISTORY & PHYSICAL   No dictation for this job.      Gwen Pounds, MD     JMR/MEDQ  D:  11/18/2006  T:  11/18/2006  Job:  [id number]

## 2010-10-19 NOTE — Consult Note (Signed)
[name], [name]                ACCOUNT NO.:  [id number]   MEDICAL RECORD NO.:  [id number]          PATIENT TYPE:  OBV   LOCATION:  6526                         FACILITY:  [facility]   PHYSICIAN:  Francisca December, M.D.  DATE OF BIRTH:  1941/05/28   DATE OF CONSULTATION:  DATE OF DISCHARGE:  11/18/2006                                 CONSULTATION   REASON FOR CONSULTATION:  Syncope.   IMPRESSION:  1. Syncope and pre-syncope with prodrome.  2. Associated symptoms suggest vasovagal  reaction.  3. Lightheadedness yesterday with disequilibrium, not associated with      cardiovascular findings.  4. Cholelithiasis.   RECOMMENDATIONS:  1. A 2-D echocardiogram, preliminary report normal.  2. Myocardial infarction ruled out.  3. Recommend 24-hour ambulatory monitor.  The patient to call office      November 20, 2006 and arrange.  4. Will follow up in office after 2 to 3 weeks of monitoring.   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  She also felt unstable and had to reach  out and hold the wall, in order to ambulate.  Findings in emergency room  were unremarkable with EKG, showing sinus rhythm and non-specific T-wave  abnormality.  Blood pressures ranged from 156/90 to 126/80.  Heart rates  range from 78 to 98 beats per minute.  Point of care enzymes and  subsequent CK-MB, troponins have been negative.  The symptoms resolved,  by the time she left the emergency room for the ward.  This entire  episode was brief preceded by an episode of nausea.   She has a history of recent syncope with minor facial trauma.  This  occurred 4 days ago and is associated with a bowel movement.  She had  lightheadedness and nausea, some diaphoresis, then felt herself onto the  floor.  Never completely passed out.  Did occur the left orbital trauma.  She finally got up, put ice on  the bruising and laid down.  The symptoms  resolved within 5-10 minutes.  However, about 3 months ago, again  associated with a bowel movement, she had nausea, vomiting, flushing and  diaphoresis.  She was very lightheaded.  She did not pass out.  She laid  down on the couch and had someone take her blood pressure.  It was  92/40.  She does not recall what her pulse was.  She rested, drank water  and felt better after about 30 minutes.   PAST MEDICAL HISTORY:  As above.  She has also had breast biopsy, remote  T&A, septoplasty.   CURRENT MEDICATIONS:  Vasotec, HCTZ and Prempro.   DRUG ALLERGIES:  NONE KNOWN.   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  No alcohol or tobacco use.  Not married, has  no children.   FAMILY HISTORY:  Father with dementia, CVA, spinal stenosis, rheumatoid  arthritis, mother with dementia.   REVIEW OF  SYSTEMS:  Negative except as mentioned above.   PHYSICAL EXAMINATION:  VITAL SIGNS:  Blood pressure is 126/80.  Pulse is  78 and regular, respiratory rate 16, temperature 98.1, negative for  orthostasis in the emergency room.  GENERAL:  This is a well-appearing 70 year old woman, no distress.  HEENT:  Shows left orbital ecchymosis, minimal swelling.  Extraocular  movements are intact.  Oral mucosa is pink and moist.  Pupils equal,  round, reactive to light.  Sclerae are anicteric.  NECK:  Supple without thyromegaly or masses.  The carotid upstrokes are  normal.  There is no bruit.  There is no JVD.  CHEST:  Clear with good excursion.  Normal vesicular breath sounds are  heard throughout.  The precordium is quiet, normal S1 and S2 is heard.  No S3-S4, murmur, click or rub noted.  ABDOMEN:  Soft, nontender.  No midline pulsatile mass.  LOWER EXTREMITIES:  No edema, intact distal pulses, full range of  motion.  NEUROLOGICAL:  Cranial nerves II-XII are intact.  Motor and sensory  grossly intact.  Gait  not tested.  SKIN:  Warm, dry and clear.   Electrocardiogram shows a sinus rhythm and T-wave flattening in aVL.  CK-  MB and troponin negative x2, as mentioned.  CT of the head:  No acute  intracranial disorder.  Serum electrolytes, BUN, creatinine, glucose and  admission hemogram all within normal limits.   COMMENTS:  Yesterday's episode is hard to explain and sounds more  neurologic than cardiovascular, given her normal BP and heart rate in  the emergency room.  The episode of nausea may be related to her  gallstones.   In fact, the very vagal-sounding symptoms associated with these  episodes, especially the one on Tuesday, suggest that the cholelithiasis  may be becoming more active and is an etiology.  This is the only  explanation I can think of for this intense vagal prodrome.   However, a primary arrhythmia still needs to be ruled out, and I agree  with your plans for carotid Doppler.  Perhaps a reassessment of the  gallbladder would be appropriate, as well.   Thank you very much for allowing me to assist in the care of [name].  It has been a pleasure to do so.      Francisca December, M.D.  Electronically Signed     JHE/MEDQ  D:  11/18/2006  T:  11/18/2006  Job:  [id number]   cc:   Geoffry Paradise, M.D.  Gwen Pounds, MD

## 2010-10-19 NOTE — Consult Note (Signed)
[name], [name]                ACCOUNT NO.:  [id number]   MEDICAL RECORD NO.:  [id number]          PATIENT TYPE:  INP   LOCATION:  2014                         FACILITY:  [facility]   PHYSICIAN:  Kristen Park. Daphine Deutscher, MD  DATE OF BIRTH:  [birth year]   DATE OF CONSULTATION:  12/01/2006  DATE OF DISCHARGE:                                 CONSULTATION   CONSULTING SURGEON:  [name], M.D.   PRIMARY CARE PHYSICIAN:  [name], M.D.   CARDIOLOGIST:  [name], M.D.   GASTROENTEROLOGIST:  [name], M.D.   PRIMARY GASTROENTEROLOGIST OUTPATIENT:  [name], M.D.   REASON FOR CONSULTATION:  Cholelithiasis, cholecystitis.   HISTORY OF PRESENT ILLNESS:  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  She had an episode on November 17, 2006, fell and sustained a bruise to her  eye.  She presented to the ER and was admitted.  Later in the day after  eating she had similar symptoms of nausea and diaphoresis.  During that  admission in mid June, the patient had a complete cardiac evaluation  with no significant etiology for her syncope and was set up for an  outpatient Holter monitor.  The patient was readmitted on November 30, 2006,  with possible cardiac symptoms of ischemia and uncontrolled  hypertension.  In addition, in talking with the patient it was  discovered she had been having problems with intermittent nausea at  times extremely profound, usually postprandial etiology.  This has been  going on for but 1 month.  She has also had about 1 week's worth of  severe right upper quadrant abdominal pain that waxed and waned, but  never completely resolved.  Again, this was exacerbated by food.  Her  workup while hospitalized this time included an ultrasound of the  abdomen and pelvis that was difficult to complete due to the patient's  significant amount of right upper quadrant pain with exam.  She was  found to have cholelithiasis but the rest of exam was very limited.  Common bile duct was not well visualized and there was a possible  hepatic duct stone noted.  CT of the abdomen and pelvis has been ordered  by gastroenterology and has not yet been completed.  A surgical  evaluation has been requested.   REVIEW OF SYSTEMS:  As above, no fever or chills.  She states she did  feel hot.  She has had presyncope and syncope symptoms as above and  again has had intermittent nausea postprandial for about 1 month and 1  week's worth of abdominal pain.   PAST MEDICAL HISTORY:  1. Hypertension.  2. Vasovagal presyncope.  3. Known gallstones, discovered recently and the patient was actually      set up to Dr. Gerrit Blake in July for evaluation for cholecystectomy      electively.   PAST SURGICAL HISTORY:  1. Breast biopsy.  2. Tonsils and adenoids resected as  a child.  3. Cystoplasty.   SOCIAL HISTORY:  No alcohol.  No tobacco.   ALLERGIES:  NKDA.   CURRENT MEDICATIONS:  Mobic, Lovenox, Protonix, Vasotec, Zofran, and  Dilaudid for pain.   PHYSICAL EXAMINATION:  GENERAL:  Pleasant.  Patient complaining of  significant right upper quadrant pain, has asked that I not press on her  abdomen during the exam.  VITAL SIGNS:  Temperature 97.5, BP 125/74, pulse 78 and regular,  respirations 18.  NEURO:  The patient is alert and oriented x3.  Moving all extremities  x4.  No focal deficits.  HEENT:  Head normocephalic.  Sclerae not injected.  NECK:  Supple without appreciable adenopathy.  CHEST:  Bilateral lung sounds clear to auscultation on room air.  Respiratory effort is nonlabored.  CARDIAC:  S1-S2 without rubs, murmurs, thrills, or gallops.  Pulses  regular.  ABDOMEN:  Soft.  Bowel sounds present.  She is exquisitely tender in the  right upper quadrant.  This pain radiates through the right lateral  abdomen.  No obvious  hepatosplenomegaly, masses, bruits, or hernias.  EXTREMITIES:  Symmetrical in appearance without edema, cyanosis,  clubbing.   LABORATORY:  Sodium 142, potassium 4, CO2 26, BUN 6, creatinine 0.73,  glucose 93.  White count 5,000, hemoglobin 10.2, platelet count 228,000,  neutrophils 72%.  PTT 28, PT 14.3, INR 1.1.  Lipase 18.  LFTs are  normal, and have been normal since admission as well as white count has  been normal since admission.   DIAGNOSTIC STUDIES:  Ultrasound of the abdomen is noted.  CT is pending.  Chest x-ray shows no acute process.   IMPRESSION:  1. Cholelithiasis and cholecystitis.  2. Vasovagal syncope, probably exacerbated by biliary colic symptoms.   PLAN:  1. Will proceed with laparoscopic cholecystectomy with intraoperative      cholangiogram today.  No need to repeat CT at this point, so this      has been cancelled.  Gastroenterology is already involved with the      patient.  If      possibility of stone is noted on IOC, the patient may need ERCP      given the fact that she may have a hepatic duct stone.  This may be      managed in the intraoperative setting.  This remains to be seen at      this point.  2. We will give Ancef on call, start IV fluids, and continue n.p.o.      status.      Kristen L. Gwyneth Sprout Daphine Deutscher, MD  Electronically Signed    ALE/MEDQ  D:  12/01/2006  T:  12/01/2006  Job:  [id number]   cc:   [name], M.D.  [name], M.D.  [name], M.D.

## 2010-10-19 NOTE — Consult Note (Signed)
NAMEJADELIN, [name]                ACCOUNT NO.:  [id number]   MEDICAL RECORD NO.:  [id number]          PATIENT TYPE:  INP   LOCATION:  2014                         FACILITY:  [facility]   PHYSICIAN:  Shirley Friar, MDDATE OF BIRTH:  Jan 23, 1941   DATE OF CONSULTATION:  12/01/2006  DATE OF DISCHARGE:                                 CONSULTATION   We were asked to see [name] today in consult for right flank and  right upper quadrant pain.   HISTORY OF PRESENT ILLNESS:  This is a 70 year old female with a recent  history of syncope who was admitted and has been complaining of right  upper quadrant pain and right flank pain for several months.  The pain  usually happens after eating.  She notes increased abdominal distension,  constipation and straining.  She has syncopal episodes associated with  her bowel movements.  They appear to be vasovagal in nature.  She  reports decreased appetite in an effort to avoid pain.   PAST MEDICAL HISTORY:  Significant for:  1. Mild hypertension.  2. Breast biopsy x2.  3. Syncope.  4. Tonsillectomy.  5. Adenoidectomy.  6. Cystoplasty.  7. She has known gallstones and a planned appointment with Dr. Gerrit Friends      of [facility] for possible cholecystectomy.   PRIMARY CARE PHYSICIAN:  Dr. Geoffry Paradise.   GASTROENTEROLOGIST:  Dr. Sherin Quarry.  She had a colonoscopy in 2008 which  she reports was normal.   CURRENT MEDICATIONS IN THE HOSPITAL:  Include Mobic, Vasotec, Lovenox  and Protonix.   ALLERGIES:  She has no known drug allergies.   REVIEW OF SYSTEMS:  Significant for no recent illness, simply a  decreased appetite due to the right upper quadrant pain.   SOCIAL HISTORY:  She is not married.  She has never had any children.  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   FAMILY HISTORY:  Significant for her mother and father who both had  gallbladder disease and cholecystectomy.   PHYSICAL EXAMINATION:   GENERAL:  She is alert and oriented in no  apparent distress.  She is pleasant to speak with.  She shows no  jaundice or icterus.  HEART:  Has a regular rate and rhythm.  LUNGS:  Clear to auscultation with no wheezes or crackles appreciated.  ABDOMEN:  Soft, nontender on the left side, nondistended with good bowel  sounds.  The patient blocks me from palpating the right side of her  abdomen, but when I do so very lightly she does not seem to be in  tremendous pain.   CURRENT LABORATORIES:  Show a hemoglobin of 10.2, hematocrit 30.1, white  count 5.0, platelets are 288.  Chem-7 is within normal limits.  Her  coags are normal.  Lipase is normal.  LFTs are normal.  Serum albumin  3.6, calcium is 9.1.  Her temperature is 98.0, pulse 78, respirations  are 18, blood pressure is 125/74.  The patient had an abdominal  ultrasound done yesterday which she describes as a difficult exam.  She  felt  that she was continually using her hand to block the ultrasound  technician from putting pressure on her right.  The results of the  ultrasound showed positive gallstones; indeed, some were large in size.  They questioned a common bile duct stone as the common bile duct could  not be visualized and there was some acoustic shadowing.   Dr. Doy Mince has seen and examined the patient.  His assessment is  that this is cholelithiasis with symptomatic gallstones, doubt common  bile duct stone at this point (no fever, no white count, normal LFTs).  As well, the patient is anemic, having syncopal episodes, experiencing  constipation, may have mild hypercalcemia.   PLAN:  Will notify Dr. Sherin Quarry of the patient's hospitalization.  Probable cholecystectomy with intraoperative cholangiogram.  In  preparation for that will order a HIDA scan to check not just ejection  fraction but any obstruction in the cystic duct as well.  Will add  MiraLax to the patient's medication regimen to help with constipation.    Thanks very much for this consultation.      Stephani Police, [place]      Shirley Friar, MD  Electronically Signed    MLY/MEDQ  D:  12/01/2006  T:  12/01/2006  Job:  [id number]   cc:   Geoffry Paradise, M.D.

## 2010-10-19 NOTE — H&P (Signed)
NAMESHANELLE, CLONTZ NO.:  [id number]   MEDICAL RECORD NO.:  [id number]          PATIENT TYPE:  INP   LOCATION:  1824                         FACILITY:  [facility]   PHYSICIAN:  Lyn Records, M.D.   DATE OF BIRTH:  12-Jun-1940   DATE OF ADMISSION:  11/30/2006  DATE OF DISCHARGE:                              HISTORY & PHYSICAL   ADMITTING CARDIOLOGIST:  Francisca December, M.D., [facility].   Patient is a full code.   PRIMARY CARE PHYSICIAN:  Geoffry Paradise, M.D.   OB/GYN:  Rica Koyanagi, C.N.M.   CARDIOLOGIST:  Lyn Records, M.D.   Patient is the historian.  She is a good historian.   VISIT TIME:  Approximately 48 minutes.   CHIEF COMPLAINT:  Dizziness.   HISTORY OF PRESENT ILLNESS:  Ms. Judy is a 70 year old female with a  history of presyncopal events for many years.  Her most recent symptoms  included an episode in April where she, after having had a bowel  movement, then felt flush and sweaty.  Her blood pressure was apparently  92/40 when she checked.  She is an LPN here.  The patient apparently in  June 13th of this month, had an episode after having a bowel movement  and urination, of severe nausea, flushing, sweaty, and presyncopal  symptoms  and possible syncope, prompting her admission to the hospital  here.  She bumped her eye on the left and has had a black eye in that  area.   The patient, apparently after that episode, ate and had recurrent  symptoms that day.  The workup on admission included a CT of the head,  which was negative.  Telemetry, which was negative.  EKG which showed  normal sinus rhythm.  She had a 2D echocardiogram, which showed an  ejection fraction of 70% with normal LV wall thickness.  No valvular  abnormalities except for mild thickening of the aortic valve.  Her  cardiac markers were unremarkable, and the plan was to discontinue  hydrochlorothiazide.  The patient was set up for an ambulatory monitor  of  the heart, which she still has.   The patient worked on the day of admission, November 30, 2006, and notes  after work, lying down on the couch and having significant left heart  numbness, which she had in the past, and right arm numbness which was  new to her and associated dizziness.  She checked her blood pressure.  It was 180/96.  The patient apparently had these symptoms up until the  time of the interview in the emergency room, which total time being 6:30  p.m. to 11:30 p.m.  The patient notes also significant lower abdominal  pain which is colicky in nature and worse with eating, particularly  after eating fatty meals.  She has had significantly decreased p.o.  intake over the last week and a .  She is only drinking during this time  about  one 8 ounce glass or less a day.  The patient notes significant  nausea, profound waves of nausea over the last  week or two, associated  with eating, and also unassociated with eating.  She had an emesis  episode a week ago.  She denies any fevers, dysuria, cough, or chest  pain.  In the emergency room, she was given an IV bolus of fluids,  Zofran, potassium chloride IV 10 mEq and 40 mEq total by tablets.  She  was also given Dilaudid 2 mg IV.   PAST MEDICAL HISTORY:  1. Hypertension.  2. History of breast biopsy that was negative.  3. History of vasovagal presyncope, as this was felt to be the      diagnosis on November 18, 2006.  4. She is status post tonsillectomy and adenoidectomy.  5. She is status post cystoplasty.  6. She has known gallstones and has an appointment with a surgeon for      possible cholecystectomy soon.   MEDICATIONS:  1. Enalopril 20 mg p.o. daily.  2. Hydrochlorothiazide 25 mg by mouth daily.  3. She did not discontinue her hydrochlorothiazide, unfortunately.      She is on Mobic 7.5 mg daily and Protonix 40 mg p.o. b.i.d.   SOCIAL HISTORY:  She lives alone.  She has no children.  She is not  married.  She does not  smoke or drink alcohol.  No illicit drug use.  She is an LPN here.   FAMILY HISTORY:  Positive for dementia and stroke in the mother.  The  mother also had rheumatoid arthritis, hypertension, and spinal stenosis.   REVIEW OF SYSTEMS:  A 10-point review of systems is negative unless  stated above.  She has not had any diarrhea.   PHYSICAL EXAMINATION:  VITAL SIGNS:  Temperature 97.6 with a pulse  initially of 105, which decreased to 76.  Respiratory rate of 20.  Blood  pressure 154/84.  Her sats are 99% on room air.  GENERAL:  She is a female who looks her stated age in mild distress.  HEENT:  Normocephalic and atraumatic.  Pupils are equal, round and  reactive to light.  Extraocular movements are intact.  Sclerae are  clear.  Nares are without any discharge.  She has very dry mucous  membranes.  There is no posterior pharyngeal lesion.  NECK:  Supple with no lymphadenopathy or thyromegaly.  No carotid  bruits.  No jugular venous distention.  CARDIOVASCULAR:  Regular rate and rhythm with normal S1 and S2.  No S3  or S4.  No murmurs, rubs or gallops.  Pulses are 2+ bilaterally,  dorsalis pedis and radial.  LUNGS:  Clear to auscultation bilaterally.  SKIN:  No rashes, lesions, or ulcers.  ABDOMEN:  Soft with no rebound.  She has significant tenderness in the  bilateral lower quadrants and right upper quadrant.  She has positive  Murphy's sign.  EXTREMITIES:  No clubbing, cyanosis or edema.  There are no rashes or  petechiae.  MUSCULOSKELETAL:  No joint deformities or effusions.  NEUROLOGIC:  She is alert and oriented x4 with cranial nerves II-XII  grossly intact.  Strength and sensation grossly intact.   Chest x-ray showed no acute cardiopulmonary disease.   EKG showed normal sinus rhythm with an event rate of 79 with normal axis  and normal intervals with nonspecific ST-T wave changes.  Her P-R  interval was 142 with a QRS of 92.  Q-T corrected at 401.  No major  change from EKG  dated November 18, 2006.   Laboratory data shows a white count of 6.2 with a normal  differential.  Hemoglobin of 11.1, hematocrit 32.8.  Platelets 259.  Sodium 143,  potassium 2.8, chloride 109, bicarb 24, BUN 10, creatinine 0.69, glucose  94.  AST was 27, ALT 23, total bilirubin was normal.  Her lipase was 18.  Calcium 9.4.  Total protein 6.7.  Albumin 3.6.  Urinalysis showed a pH  of 7 and a specific gravity of 1.005.  Ketones were 15.  She has small  leukocyte esterase with negative nitrites.  White blood cells 0-2, RBCs  0-2, glucose negative.  Hemoglobin trace.   [place] prelim  of the abdomen ber er md shows gallstones and possible stone  in the common bile duct.   ASSESSMENT/PLAN:  This is a patient with possible presyncopal/syncopal  vasovagal syndrome, which has now been possibly exacerbated due to  hypovolemia.  The hypovolemia is likely from anorexia, and the anorexia  is likely from gallstones and possible cholelithiasis.  Unfortunately,  she is also on hydrochlorothiazide.  Her hypokalemia may be due to  persistent nausea and a history of vomiting and may be causing her  numbness and tingling.  1. For her hypovolemia, we will give her IV fluids and discontinue      hydrochlorothiazide.  Will check orthostatics.  2. For her gallbladder disease, we will get a surgical consult to      decide whether or not she needs surgery sooner or later. GI      medicine also consulted for possible endoscopic intervention. Will      continue IV fluids and hold her n.p.o. for now.  We will give her      antiemetics and analgesics.  If she has an abnormal urinalysis, we      will get a urine culture.  3. For her hypokalemia, will give her 40 mEq of potassium tablets now      again.  4. For her deep venous thrombosis prophylaxis, we will give her      enoxaparin, GI prophylaxis with Protonix 40 mg IV and b.i.d.  5. For her hypertension, we will continue on Vasotec for now and try      to relieve  her pain to see if this decreases her blood pressure.      Darryl D. Prime, MD   Electronically Signed     ______________________________  Lyn Records, M.D.    DDP/MEDQ  D:  12/01/2006  T:  12/01/2006  Job:  [id number]

## 2010-10-19 NOTE — Op Note (Signed)
NAMESHAYLEY, [name]                ACCOUNT NO.:  [id number]   MEDICAL RECORD NO.:  [id number]          PATIENT TYPE:  INP   LOCATION:  2014                         FACILITY:  [facility]   PHYSICIAN:  Thornton Park. Daphine Deutscher, MD  DATE OF BIRTH:  03-11-1941   DATE OF PROCEDURE:  12/01/2006  DATE OF DISCHARGE:                               OPERATIVE REPORT   PREOPERATIVE DIAGNOSIS:  Cholecystitis.   POSTOPERATIVE DIAGNOSIS:  Cholecystitis.   PROCEDURE:  Laparoscopic cholecystectomy with intraoperative  cholangiogram.   SURGEON:  Thornton Park. Daphine Deutscher, M.D.   ANESTHESIA:  General endotracheal.   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  The abdomen was prepped with TechniCare and  draped sterilely.  A longitudinal incision was made down to the  umbilicus through which we inserted the Hassan cannula and insufflated  the abdomen.  Initial survey of the abdomen failed to reveal any other  pathology, but the gallbladder was found to be very long and redundant,  flopping over the liver.  The gallbladder was grasped, elevated, and I  dissected free Calot's triangle.  I clipped the artery, clipped the duct  proximally and incised it and did a dynamic cholangiogram.  This showed  normal filling of the intrahepatic ducts and free flow in the duodenum.  The cystic duct was then triple clipped and divided.  The cystic artery  was clipped and divided.  The gallbladder was removed from the  gallbladder bed without entering it.  At the end, I noticed that there  was a very redundant along fundus which was flipping over the  gallbladder, possibly contributing to her pain.  It was removed through  the umbilicus without difficulty.  I went back with the [facility] and  inspected the gallbladder bed.  No bleeding or bile leaks were noted.  I  then repaired this with a 0 Vicryl and injected all  port sites with  Marcaine.  The wounds were closed with 4-0 Vicryl subcutaneously and  with Dermabond.  The patient tolerated the procedure well and was taken  to the recovery room in satisfactory addition.      Thornton Park Daphine Deutscher, MD  Electronically Signed     MBM/MEDQ  D:  12/01/2006  T:  12/02/2006  Job:  [id number]

## 2010-10-22 NOTE — Discharge Summary (Signed)
NAMESHARMANE, DAME                ACCOUNT NO.:  [id number]   MEDICAL RECORD NO.:  [id number]          PATIENT TYPE:  INP   LOCATION:  2014                         FACILITY:  [facility]   PHYSICIAN:  Thornton Park. 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 Amil Amen with dizziness and possible arrhythmia, but in fact was  having right flank and right upper quadrant abdominal pain for several  months.  She was found to have gallstones.  I was consulted and she was  seen by the [facility] service and underwent a laparoscopic cholecystectomy by  me on December 01, 2006. Laparoscopic cholecystectomy with intraoperative  cholangiogram which was normal.  She did well  and was discharged by my  partner Dr. Ezzard Standing on December 03, 2006 which was postop day #2.  The  pathology report showed focal adenomatous change, chronic cholecystitis  with cholelithiasis.  No high-grade dysplasia or evidence of malignancy  was seen.   FINAL DIAGNOSIS:  As above on pathology.   CONDITION:  Good.   Return follow-up in 2-3 weeks.      Thornton Park Daphine Deutscher, MD  Electronically Signed     MBM/MEDQ  D:  01/23/2007  T:  01/24/2007  Job:  [id number]   cc:   Francisca December, M.D.  Geoffry Paradise, M.D.

## 2010-12-14 ENCOUNTER — Other Ambulatory Visit: Payer: Self-pay | Admitting: Internal Medicine

## 2010-12-14 DIAGNOSIS — Z1231 Encounter for screening mammogram for malignant neoplasm of breast: Secondary | ICD-10-CM

## 2011-01-11 ENCOUNTER — Ambulatory Visit
Admission: RE | Admit: 2011-01-11 | Discharge: 2011-01-11 | Disposition: A | Discharge status: Home / Self Care | Payer: Medicare Other | Source: Ambulatory Visit | Attending: Internal Medicine | Admitting: Internal Medicine

## 2011-01-11 DIAGNOSIS — Z1231 Encounter for screening mammogram for malignant neoplasm of breast: Secondary | ICD-10-CM

## 2011-03-23 LAB — CBC
HCT: 29.8 — ABNORMAL LOW
HCT: 30.1 — ABNORMAL LOW
HCT: 32.8 — ABNORMAL LOW
Hemoglobin: 10.2 — ABNORMAL LOW
Hemoglobin: 10.2 — ABNORMAL LOW
Hemoglobin: 11.1 — ABNORMAL LOW
MCHC: 33.8
MCHC: 33.8
MCHC: 34.2
MCV: 86.6
MCV: 88.4
MCV: 88.7
Platelets: 228
Platelets: 237
Platelets: 259
RBC: 3.37 — ABNORMAL LOW
RBC: 3.4 — ABNORMAL LOW
RBC: 3.79 — ABNORMAL LOW
RDW: 14
RDW: 14.4 — ABNORMAL HIGH
RDW: 14.5 — ABNORMAL HIGH
WBC: 5
WBC: 6.2
WBC: 8.5

## 2011-03-23 LAB — DIFFERENTIAL
Basophils Absolute: 0
Basophils Absolute: 0
Basophils Relative: 0
Basophils Relative: 0
Eosinophils Absolute: 0
Eosinophils Absolute: 0
Eosinophils Relative: 0
Eosinophils Relative: 0
Lymphocytes Relative: 15
Lymphocytes Relative: 17
Lymphs Abs: 0.7
Lymphs Abs: 1
Monocytes Absolute: 0.6
Monocytes Absolute: 0.9 — ABNORMAL HIGH
Monocytes Relative: 12 — ABNORMAL HIGH
Monocytes Relative: 14 — ABNORMAL HIGH
Neutro Abs: 3.6
Neutro Abs: 4.2
Neutrophils Relative %: 69
Neutrophils Relative %: 72

## 2011-03-23 LAB — I-STAT 8, (EC8 V) (CONVERTED LAB)
Acid-Base Excess: 2
BUN: 9
Bicarbonate: 24.1 — ABNORMAL HIGH
Chloride: 109
Glucose, Bld: 90
HCT: 34 — ABNORMAL LOW
Hemoglobin: 11.6 — ABNORMAL LOW
Operator id: 272551
Potassium: 2.8 — ABNORMAL LOW
Sodium: 142
TCO2: 25
pCO2, Ven: 29 — ABNORMAL LOW
pH, Ven: 7.528 — ABNORMAL HIGH

## 2011-03-23 LAB — PROTIME-INR
INR: 1.1
INR: 1.1
Prothrombin Time: 14
Prothrombin Time: 14.3

## 2011-03-23 LAB — URINALYSIS, ROUTINE W REFLEX MICROSCOPIC
Bilirubin Urine: NEGATIVE
Glucose, UA: NEGATIVE
Ketones, ur: 15 — AB
Nitrite: NEGATIVE
Protein, ur: NEGATIVE
Specific Gravity, Urine: 1.005
Urobilinogen, UA: 0.2
pH: 7

## 2011-03-23 LAB — BASIC METABOLIC PANEL
BUN: 6
CO2: 26
Calcium: 8.2 — ABNORMAL LOW
Chloride: 111
Creatinine, Ser: 0.73
GFR calc Af Amer: >60
GFR calc non Af Amer: >60
Glucose, Bld: 93
Potassium: 4
Sodium: 142

## 2011-03-23 LAB — COMPREHENSIVE METABOLIC PANEL
ALT: 22
ALT: 23
AST: 27
AST: 28
Albumin: 2.9 — ABNORMAL LOW
Albumin: 3.6
Alkaline Phosphatase: 65
Alkaline Phosphatase: 72
BUN: 10
BUN: 4 — ABNORMAL LOW
CO2: 24
CO2: 24
Calcium: 8.5
Calcium: 9.4
Chloride: 103
Chloride: 109
Creatinine, Ser: 0.69
Creatinine, Ser: 0.73
GFR calc Af Amer: >60
GFR calc Af Amer: >60
GFR calc non Af Amer: >60
GFR calc non Af Amer: >60
Glucose, Bld: 126 — ABNORMAL HIGH
Glucose, Bld: 94
Potassium: 2.8 — ABNORMAL LOW
Potassium: 3.8
Sodium: 138
Sodium: 143
Total Bilirubin: 0.6
Total Bilirubin: 0.8
Total Protein: 5.6 — ABNORMAL LOW
Total Protein: 6.7

## 2011-03-23 LAB — CK TOTAL AND CKMB (NOT AT ARMC)
CK, MB: 1.9
CK, MB: 2.1
Relative Index: INVALID
Relative Index: INVALID
Total CK: 68
Total CK: 76

## 2011-03-23 LAB — TROPONIN I
Troponin I: 0.02
Troponin I: 0.03

## 2011-03-23 LAB — APTT
aPTT: 27
aPTT: 28

## 2011-03-23 LAB — URINE MICROSCOPIC-ADD ON

## 2011-03-23 LAB — URINE CULTURE: Colony Count: 75000

## 2011-03-23 LAB — LIPASE, BLOOD: Lipase: 18

## 2011-03-24 LAB — URINE MICROSCOPIC-ADD ON

## 2011-03-24 LAB — I-STAT 8, (EC8 V) (CONVERTED LAB)
Acid-Base Excess: 4 — ABNORMAL HIGH
BUN: 11
Bicarbonate: 29.7 — ABNORMAL HIGH
Chloride: 104
Glucose, Bld: 102 — ABNORMAL HIGH
HCT: 40
Hemoglobin: 13.6
Operator id: 161631
Potassium: 3.5
Sodium: 137
TCO2: 31
pCO2, Ven: 45.6
pH, Ven: 7.423 — ABNORMAL HIGH

## 2011-03-24 LAB — CK TOTAL AND CKMB (NOT AT ARMC)
CK, MB: 2.9
CK, MB: 3.3
CK, MB: 3.5
Relative Index: 2.9 — ABNORMAL HIGH
Relative Index: 3.1 — ABNORMAL HIGH
Relative Index: INVALID
Total CK: 101
Total CK: 107
Total CK: 99

## 2011-03-24 LAB — CORTISOL: Cortisol, Plasma: 22.4

## 2011-03-24 LAB — CBC
HCT: 33.5 — ABNORMAL LOW
HCT: 37.2
Hemoglobin: 11.4 — ABNORMAL LOW
Hemoglobin: 12.5
MCHC: 33.5
MCHC: 34.1
MCV: 87.3
MCV: 87.7
Platelets: 259
Platelets: 298
RBC: 3.84 — ABNORMAL LOW
RBC: 4.24
RDW: 14.7 — ABNORMAL HIGH
RDW: 14.8 — ABNORMAL HIGH
WBC: 4.6
WBC: 5.1

## 2011-03-24 LAB — URINALYSIS, ROUTINE W REFLEX MICROSCOPIC
Bilirubin Urine: NEGATIVE
Glucose, UA: NEGATIVE
Ketones, ur: NEGATIVE
Leukocytes, UA: NEGATIVE
Nitrite: NEGATIVE
Protein, ur: NEGATIVE
Specific Gravity, Urine: 1.014
Urobilinogen, UA: 0.2
pH: 6

## 2011-03-24 LAB — COMPREHENSIVE METABOLIC PANEL
ALT: 10
AST: 19
Albumin: 3.3 — ABNORMAL LOW
Alkaline Phosphatase: 63
BUN: 9
CO2: 29
Calcium: 9.2
Chloride: 104
Creatinine, Ser: 0.69
GFR calc Af Amer: >60
GFR calc non Af Amer: >60
Glucose, Bld: 94
Potassium: 3.8
Sodium: 139
Total Bilirubin: 0.7
Total Protein: 6.4

## 2011-03-24 LAB — DIFFERENTIAL
Basophils Absolute: 0
Basophils Relative: 0
Eosinophils Absolute: 0
Eosinophils Relative: 1
Lymphocytes Relative: 19
Lymphs Abs: 1
Monocytes Absolute: 0.6
Monocytes Relative: 12 — ABNORMAL HIGH
Neutro Abs: 3.4
Neutrophils Relative %: 68

## 2011-03-24 LAB — TSH: TSH: 1.901

## 2011-03-24 LAB — TROPONIN I
Troponin I: 0.01
Troponin I: 0.01
Troponin I: 0.02

## 2011-03-24 LAB — POCT I-STAT CREATININE
Creatinine, Ser: 0.7
Operator id: 161631

## 2011-05-11 ENCOUNTER — Encounter: Payer: Self-pay | Admitting: *Deleted

## 2011-05-11 ENCOUNTER — Ambulatory Visit (INDEPENDENT_AMBULATORY_CARE_PROVIDER_SITE_OTHER): Payer: Medicare Other | Admitting: Gynecology

## 2011-05-11 VITALS — BP 138/72 | Ht 66.25 in | Wt 179.0 lb

## 2011-05-11 DIAGNOSIS — L293 Anogenital pruritus, unspecified: Secondary | ICD-10-CM

## 2011-05-11 DIAGNOSIS — N951 Menopausal and female climacteric states: Secondary | ICD-10-CM

## 2011-05-11 DIAGNOSIS — R823 Hemoglobinuria: Secondary | ICD-10-CM

## 2011-05-11 DIAGNOSIS — L292 Pruritus vulvae: Secondary | ICD-10-CM

## 2011-05-11 DIAGNOSIS — E7800 Pure hypercholesterolemia, unspecified: Secondary | ICD-10-CM | POA: Insufficient documentation

## 2011-05-11 DIAGNOSIS — Z79890 Hormone replacement therapy (postmenopausal): Secondary | ICD-10-CM

## 2011-05-11 DIAGNOSIS — M1990 Unspecified osteoarthritis, unspecified site: Secondary | ICD-10-CM | POA: Insufficient documentation

## 2011-05-11 DIAGNOSIS — N952 Postmenopausal atrophic vaginitis: Secondary | ICD-10-CM

## 2011-05-11 MED ORDER — CLOBETASOL PROPIONATE 0.05 % EX OINT: TOPICAL_OINTMENT | Freq: Two times a day (BID) | CUTANEOUS | Status: DC

## 2011-05-11 MED ORDER — CONJ ESTROG-MEDROXYPROGEST ACE 0.3-1.5 MG PO TABS: 1.0000 | ORAL_TABLET | Freq: Every day | ORAL | Status: DC

## 2011-05-11 NOTE — Patient Instructions (Signed)
Follow up annually.  Report any vaginal bleeding.

## 2011-05-11 NOTE — Progress Notes (Signed)
[name] 29-Nov-1940 [id number]        70 y.o.  for follow up, former patient of Dr. August Saucer [facility]. She is on HRT of Prempro 0.3 mg. She tried weaning earlier this year for surgery and had significant hot flashes and started it again. She is also on Temovate cream for intermittent vulvar pruritus.  Past medical history,surgical history, medications, allergies, family history and social history were all reviewed and documented in the EPIC chart. ROS:  Was performed and pertinent positives and negatives are included in the history.  Exam: chaperone present Filed Vitals:   05/11/11 0956  BP: 138/72   General appearance  Normal Skin grossly normal Head/Neck normal with no cervical or supraclavicular adenopathy thyroid normal Lungs  clear Cardiac RR, without RMG Abdominal  soft, nontender, without masses, organomegaly or hernia Breasts  examined lying and sitting without masses, retractions, discharge or axillary adenopathy.  Right breast with a well-healed periareolar scar Pelvic  Ext/BUS/vagina  Atrophic changes with symmetrical clitoral hood and upper labial whiteish changes consistent with lichen sclerosus.   Vagina admits one finger for bimanual  Cervix  normal  atrophic  Uterus  axial, normal size, shape and contour, midline and mobile nontender   Adnexa  Without masses or tenderness    Anus and perineum  normal   Rectovaginal  normal sphincter tone without palpated masses or tenderness.    Assessment/Plan:  70 y.o. female for annual exam.    1. HRT. I discussed HRT with patient to include the WHI study increased risk of stroke, heart attack, DVT as well as increased risk of breast cancer and the endometrial cancer issue. I reviewed ACOG and NAMS statements for lowest dose, shortest period of time. She did try weaning earlier this year but had significant hot flashes and she restarted. Patient will continue HRT at her choice and she accepts the risks.   I refilled her times a year.   She knows to report any bleeding. 2. Lichen sclerosus. 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 She is using Temovate cream intermittently. I did discuss the risks of overuse and atrophy of the skin. I refilled her Temovate times a year to continue using as needed. 3. Pap smear. I did not do a Pap smear today. She has no history of abnormal Pap smears historically and had a Pap smear last year. I discussed current guidelines.  She is over 9 with a negative history and I suggest we stop doing them and she agrees with this. 4. Breast health. SBE monthly reviewed. She had her mammogram in 2012, will continue with annual mammography. 5. Bone health. She gets bone densities at Dr. Daine Gravel office and will follow up with him in reference to this. 6. Colonoscopy patient is due for colonoscopy next year she'll follow up for this. 7. Health maintenance no blood work was done today this is all done through Dr. Daine Gravel office. Patient will follow up with me in a year, sooner if she has any issues.    [name] MD, 11:07 AM 05/11/2011

## 2011-08-08 ENCOUNTER — Other Ambulatory Visit: Payer: Self-pay | Admitting: Gastroenterology

## 2012-04-02 ENCOUNTER — Other Ambulatory Visit: Payer: Self-pay | Admitting: Internal Medicine

## 2012-04-02 DIAGNOSIS — Z1231 Encounter for screening mammogram for malignant neoplasm of breast: Secondary | ICD-10-CM

## 2012-05-05 ENCOUNTER — Other Ambulatory Visit: Payer: Self-pay | Admitting: Gynecology

## 2012-05-07 NOTE — Telephone Encounter (Signed)
rx called to pharmacy 

## 2012-05-14 ENCOUNTER — Ambulatory Visit: Payer: Medicare Other

## 2012-05-15 ENCOUNTER — Encounter: Payer: Medicare Other | Admitting: Gynecology

## 2012-05-22 ENCOUNTER — Encounter: Payer: Medicare Other | Admitting: Gynecology

## 2012-06-13 ENCOUNTER — Other Ambulatory Visit: Payer: Self-pay | Admitting: Gynecology

## 2012-06-19 ENCOUNTER — Encounter: Payer: Self-pay | Admitting: Gynecology

## 2012-06-19 ENCOUNTER — Ambulatory Visit (INDEPENDENT_AMBULATORY_CARE_PROVIDER_SITE_OTHER): Payer: Medicare Other | Admitting: Gynecology

## 2012-06-19 VITALS — BP 124/78 | Ht 66.0 in | Wt 173.0 lb

## 2012-06-19 DIAGNOSIS — N393 Stress incontinence (female) (male): Secondary | ICD-10-CM

## 2012-06-19 DIAGNOSIS — Z79890 Hormone replacement therapy (postmenopausal): Secondary | ICD-10-CM

## 2012-06-19 DIAGNOSIS — N3941 Urge incontinence: Secondary | ICD-10-CM

## 2012-06-19 DIAGNOSIS — L900 Lichen sclerosus et atrophicus: Secondary | ICD-10-CM

## 2012-06-19 DIAGNOSIS — L940 Localized scleroderma [morphea]: Secondary | ICD-10-CM

## 2012-06-19 MED ORDER — CLOBETASOL PROPIONATE 0.05 % EX OINT: TOPICAL_OINTMENT | Freq: Two times a day (BID) | CUTANEOUS | Status: DC

## 2012-06-19 NOTE — Progress Notes (Signed)
[name] 14-Sep-1940 [id number]        72 y.o.  G0P0 for follow up exam.    Past medical history,surgical history, medications, allergies, family history and social history were all reviewed and documented in the EPIC chart. ROS:  Was performed and pertinent positives and negatives are included in the history.  Exam: Kim assistant Filed Vitals:   06/19/12 1404  BP: 124/78  Height: 5\' 6"  (1.676 m)  Weight: 173 lb (78.472 kg)   General appearance  Normal Skin grossly normal Head/Neck normal with no cervical or supraclavicular adenopathy thyroid normal Lungs  clear Cardiac RR, without RMG Abdominal  soft, nontender, without masses, organomegaly or hernia Breasts  examined lying and sitting without masses, retractions, discharge or axillary adenopathy. Pelvic  Ext/BUS/vagina  Atrophic changes with symmetrical white skin changes. Clitorally to upper labia bilaterally consistent with lichen sclerosus  Cervix  normal atrophic  Uterus  axial, normal size, shape and contour, midline and mobile nontender   Adnexa  Without masses or tenderness    Anus and perineum  normal   Rectovaginal  normal sphincter tone without palpated masses or tenderness.    Assessment/Plan:  72 y.o. G0P0 female for follow up exam.   1. HRT. Patient is on Prempro .3/1.5. She tried weaning and had significant hot flashes and reinitiated. She asked about something cheaper and was told about 3 alternatives for her insurance plan but did not bring the options with her. I again reviewed the risks of HRT to include stroke heart attack DVT and breast cancer. Transdermal versus oral issues also reviewed. Patient's going to get me the list of options to review and then suggest a choice.  She is not interested in stopping at this point from a quality of life standpoint and understands and accepts the risks. She knows to report any vaginal bleeding. 2. Lichen sclerosus. Patient uses Temovate cream intermittently and does well  with this and I refilled her x1 year. 3. Stress urinary incontinence symptoms. Patient has some loss of urine with coughing sneezing laughing. Exam is normal without evidence of cystocele or prolapse. Discussed Kegel exercises up to and including surgery such as swelling. Patient plans observation at this time. Check urinalysis. 4. Urgency type symptoms. Patient does have some urgency type symptoms along with her stress urinary incontinence. We'll check urinalysis. Does drink a lot coffee and decrease caffeine and carbonated beverage issues discussed.  OAB medication availability reviewed pros and cons/risks benefits discussed. Patient's uninterested at this point but will call if she wants to pursue a trial of OAB medication. OTC oxybutynin option also discussed. 5. Mammography overdo area patient knows to schedule. SBE monthly reviewed. 6. DEXA 2013. Results unavailable. She is to follow up with Dr. Jacky Kindle in reference to this who is following her. 7. Colonoscopy 2013. Repeated there recommended interval. 8. Pap smear 2012. No Pap smear done today.  No history of abnormal Pap smears.  Options to stop screening altogether she is over the age of 83 versus less frequent screening reviewed. We'll readdress on an annual basis. 9. Health maintenance. No blood work done today as it is all done through Dr. Lanell Matar office who she sees on a regular basis. Follow up one year.    [name] MD, 2:42 PM 06/19/2012

## 2012-06-19 NOTE — Patient Instructions (Signed)
Call me with hormone replacement options.  Call if you ever have any bleeding. Call if you want to try and overactive bladder medication. Follow up one year for annual exam.

## 2012-07-13 ENCOUNTER — Telehealth: Payer: Self-pay | Admitting: *Deleted

## 2012-07-13 NOTE — Telephone Encounter (Signed)
Pt is currently taking Prempro .3/1.5 pt said that her insurance is no longer paying for this, she would have to pay the full price at $ 90 per month. Pt asked if you know of any other medications that she can take? Please advise

## 2012-07-13 NOTE — Telephone Encounter (Signed)
We talked about this at her office visit. She was to get [place] the options recommended by her insurance company as alternatives. If she cannot get these I can suggest alternatives but they may be as expensive as the Prempro depending on what her insurance covers.

## 2012-07-16 NOTE — Telephone Encounter (Signed)
Pt said the letter said alternative was fosamax and Effexor neither medication is what patient will need to help with her issue. Pt will contact insurance company and let them know the two drugs the told her will not help with her problems. She will call back.

## 2012-07-17 MED ORDER — ESTRADIOL 0.5 MG PO TABS: 0.5000 mg | ORAL_TABLET | Freq: Every day | ORAL | Status: DC

## 2012-07-17 MED ORDER — PROGESTERONE MICRONIZED 100 MG PO CAPS: ORAL_CAPSULE | ORAL | Status: DC

## 2012-07-17 NOTE — Telephone Encounter (Signed)
Pt spoke with her insurance company and they gave 2 names of medication estradiol and progesterone that is on her insurance plan to be prescribed. Please advise

## 2012-07-17 NOTE — Telephone Encounter (Signed)
Pt informed with the below note, rx sent. 

## 2012-07-17 NOTE — Addendum Note (Signed)
Addended by: Aura Camps on: 07/17/2012 03:58 PM   Modules accepted: Orders

## 2012-07-17 NOTE — Telephone Encounter (Signed)
Recommend estradiol 0.5 mg #30 one by mouth daily refill x6 and Prometrium 100 mg #30 one by mouth each bedtime refill x6

## 2012-07-27 ENCOUNTER — Ambulatory Visit
Admission: RE | Admit: 2012-07-27 | Discharge: 2012-07-27 | Disposition: A | Discharge status: Home / Self Care | Payer: Medicare Other | Source: Ambulatory Visit | Attending: Internal Medicine | Admitting: Internal Medicine

## 2012-07-27 DIAGNOSIS — Z1231 Encounter for screening mammogram for malignant neoplasm of breast: Secondary | ICD-10-CM

## 2012-09-28 ENCOUNTER — Other Ambulatory Visit: Payer: Self-pay | Admitting: Neurological Surgery

## 2012-09-28 DIAGNOSIS — M47816 Spondylosis without myelopathy or radiculopathy, lumbar region: Secondary | ICD-10-CM

## 2012-10-02 ENCOUNTER — Ambulatory Visit
Admission: RE | Admit: 2012-10-02 | Discharge: 2012-10-02 | Disposition: A | Discharge status: Home / Self Care | Payer: Medicare Other | Source: Ambulatory Visit | Attending: Neurological Surgery | Admitting: Neurological Surgery

## 2012-10-02 DIAGNOSIS — M47816 Spondylosis without myelopathy or radiculopathy, lumbar region: Secondary | ICD-10-CM

## 2012-10-02 MED ORDER — GADOBENATE DIMEGLUMINE 529 MG/ML IV SOLN
15.0000 mL | Freq: Once | INTRAVENOUS | Status: AC | PRN
  Administered 2012-10-02: 15 mL via INTRAVENOUS

## 2012-10-04 ENCOUNTER — Other Ambulatory Visit: Payer: Medicare Other

## 2013-01-10 ENCOUNTER — Other Ambulatory Visit: Payer: Self-pay

## 2013-01-10 MED ORDER — PROGESTERONE MICRONIZED 100 MG PO CAPS: ORAL_CAPSULE | ORAL | Status: DC

## 2013-02-13 ENCOUNTER — Telehealth: Payer: Self-pay | Admitting: *Deleted

## 2013-02-13 ENCOUNTER — Other Ambulatory Visit: Payer: Self-pay | Admitting: Gynecology

## 2013-02-13 NOTE — Telephone Encounter (Signed)
Pharmacy faxed prior authorization request for estradiol 0.5 mg tablet 1 po daily. Sent to [facility] to be approved.

## 2013-02-14 NOTE — Telephone Encounter (Signed)
Estradiol 0.05 mg tab approved through 02/13/2014 per [facility] . Pharmacy informed as well.

## 2013-07-04 DIAGNOSIS — M545 Low back pain, unspecified: Secondary | ICD-10-CM | POA: Insufficient documentation

## 2013-07-16 ENCOUNTER — Other Ambulatory Visit: Payer: Self-pay

## 2013-07-16 DIAGNOSIS — Z1231 Encounter for screening mammogram for malignant neoplasm of breast: Secondary | ICD-10-CM

## 2013-07-27 ENCOUNTER — Other Ambulatory Visit: Payer: Self-pay | Admitting: Gynecology

## 2013-07-31 ENCOUNTER — Ambulatory Visit: Payer: Medicare Other

## 2013-08-12 ENCOUNTER — Ambulatory Visit
Admission: RE | Admit: 2013-08-12 | Discharge: 2013-08-12 | Disposition: A | Discharge status: Home / Self Care | Payer: Medicare Other | Source: Ambulatory Visit

## 2013-08-12 DIAGNOSIS — Z1231 Encounter for screening mammogram for malignant neoplasm of breast: Secondary | ICD-10-CM

## 2013-08-15 ENCOUNTER — Ambulatory Visit (INDEPENDENT_AMBULATORY_CARE_PROVIDER_SITE_OTHER): Payer: Medicare Other | Admitting: Gynecology

## 2013-08-15 ENCOUNTER — Encounter: Payer: Self-pay | Admitting: Gynecology

## 2013-08-15 VITALS — BP 124/76 | Ht 66.0 in | Wt 180.0 lb

## 2013-08-15 DIAGNOSIS — R32 Unspecified urinary incontinence: Secondary | ICD-10-CM

## 2013-08-15 DIAGNOSIS — Z79890 Hormone replacement therapy (postmenopausal): Secondary | ICD-10-CM

## 2013-08-15 DIAGNOSIS — L900 Lichen sclerosus et atrophicus: Secondary | ICD-10-CM

## 2013-08-15 DIAGNOSIS — N952 Postmenopausal atrophic vaginitis: Secondary | ICD-10-CM

## 2013-08-15 DIAGNOSIS — L940 Localized scleroderma [morphea]: Secondary | ICD-10-CM

## 2013-08-15 MED ORDER — ESTRADIOL 0.5 MG PO TABS: ORAL_TABLET | ORAL | Status: DC

## 2013-08-15 MED ORDER — PROGESTERONE MICRONIZED 100 MG PO CAPS: ORAL_CAPSULE | ORAL | Status: DC

## 2013-08-15 NOTE — Progress Notes (Signed)
Kristen MontgomeryMartha E Blake 08/16/1940 [id number]        73 y.o.  G0P0 for followup exam. Several issues noted below.    Past medical history,surgical history, problem list, medications, allergies, family history and social history were all reviewed and documented in the EPIC chart.  ROS:  Performed and pertinent positives and negatives are included in the history, assessment and plan .  Exam: Kim assistant Filed Vitals:   08/15/13 1033  BP: 124/76  Height: 5\' 6"  (1.676 m)  Weight: 180 lb (81.647 kg)   General appearance  Normal Skin grossly normal Head/Neck normal with no cervical or supraclavicular adenopathy thyroid normal Lungs  clear Cardiac RR, without RMG Abdominal  soft, nontender, without masses, organomegaly or hernia Breasts  examined lying and sitting without masses, retractions, discharge or axillary adenopathy. Pelvic  Ext/BUS/vagina generalized atrophic changes. Pearly white. The upper labia and perineal body changes consistent with lichen sclerosus. Mild skin cracking perineal body. Vaginal width compromised.  Cervix atrophic  Uterus anteverted, normal size, shape and contour, midline and mobile nontender   Adnexa  Without masses or tenderness    Anus and perineum  Normal   Rectovaginal  Normal sphincter tone without palpated masses or tenderness.    Assessment/Plan:  73 y.o. G0P0 female for followup exam.   1. Postmenopausal/atrophic genital changes/HRT. Patient continues on estradiol 0.5 mg and Prometrium 100 mg nightly. Has tried weaning with unacceptable symptoms of hot flushes sweats and not feeling well.  I again reviewed the whole issue of HRT with her to include the WHI study with increased risk of stroke, heart attack, DVT and breast cancer. The ACOG and NAMS statements for lowest dose for the shortest period of time reviewed. Transdermal versus oral first-pass effect benefit discussed. Use of HRT into the 70s also discussed. Ultimately the patient wants to continue  clearly understands the risks and I refilled her x1 year. Patient does note one episode of spotting 3-4 months ago that she felt was due to her lichen sclerosus cracking externally as she grabbed it with a tissue and that's where the spotting was. She does have some cracking along the perineal body. I reviewed the issue of endometrial stimulation with estrogen possible hyperplasia or early carcinoma. Options for evaluation to include ultrasound possible biopsy discussed.  Patient declines further evaluation at this point but will report if she has any recurrent spotting. 2. Lichen sclerosus. Using Temovate cream intermittently as needed for symptoms. Has supply at home but will call when she needs refill. 3. Urinary incontinence. Does continue to have some issues with urgency and stress symptoms. We have discussed this previously as far as options to include OAB medication and surgery. Patient is using behavior modification and does not want intervention at this point. Check urinalysis 4. Pap smear 2012. No Pap smear done today. No history of significant abnormal Pap smears. Reviewed current screening guidelines. If she is over the age of 72 we are both comfortable stop screening. 5. Mammography 08/2013. Continue with annual mammography. SBE monthly reviewed. 6. DEXA coming due this year. This is done through Dr. Lanell MatarAronson's office. I have no copies of these results and she'll continue to followup with him in reference to bone health. 7. Colonoscopy 2 years ago with recommended repeat interval 10 years. 8. Health maintenance. No routine lab work done as it is all done through her primary physician's office. Followup 1 year sooner if any bleeding.   Note: This document was prepared with digital dictation and possible smart  Lobbyist. Any transcriptional errors that result from this process are unintentional.   [name] MD, 10:59 AM 08/15/2013

## 2013-08-15 NOTE — Patient Instructions (Signed)
Report any further vaginal bleeding. Call if any issues. Followup in 1 year for reexamination.  Health Maintenance, Female A healthy lifestyle and preventative care can promote health and wellness.  Maintain regular health, dental, and eye exams.  Eat a healthy diet. Foods like vegetables, fruits, whole grains, low-fat dairy products, and lean protein foods contain the nutrients you need without too many calories. Decrease your intake of foods high in solid fats, added sugars, and salt. Get information about a proper diet from your caregiver, if necessary.  Regular physical exercise is one of the most important things you can do for your health. Most adults should get at least 150 minutes of moderate-intensity exercise (any activity that increases your heart rate and causes you to sweat) each week. In addition, most adults need muscle-strengthening exercises on 2 or more days a week.   Maintain a healthy weight. The body mass index (BMI) is a screening tool to identify possible weight problems. It provides an estimate of body fat based on height and weight. Your caregiver can help determine your BMI, and can help you achieve or maintain a healthy weight. For adults 20 years and older:  A BMI below 18.5 is considered underweight.  A BMI of 18.5 to 24.9 is normal.  A BMI of 25 to 29.9 is considered overweight.  A BMI of 30 and above is considered obese.  Maintain normal blood lipids and cholesterol by exercising and minimizing your intake of saturated fat. Eat a balanced diet with plenty of fruits and vegetables. Blood tests for lipids and cholesterol should begin at age 32 and be repeated every 5 years. If your lipid or cholesterol levels are high, you are over 50, or you are a high risk for heart disease, you may need your cholesterol levels checked more frequently.Ongoing high lipid and cholesterol levels should be treated with medicines if diet and exercise are not effective.  If you  smoke, find out from your caregiver how to quit. If you do not use tobacco, do not start.  Lung cancer screening is recommended for adults aged 67 80 years who are at high risk for developing lung cancer because of a history of smoking. Yearly low-dose computed tomography (CT) is recommended for people who have at least a 30-pack-year history of smoking and are a current smoker or have quit within the past 15 years. A pack year of smoking is smoking an average of 1 pack of cigarettes a day for 1 year (for example: 1 pack a day for 30 years or 2 packs a day for 15 years). Yearly screening should continue until the smoker has stopped smoking for at least 15 years. Yearly screening should also be stopped for people who develop a health problem that would prevent them from having lung cancer treatment.  If you are pregnant, do not drink alcohol. If you are breastfeeding, be very cautious about drinking alcohol. If you are not pregnant and choose to drink alcohol, do not exceed 1 drink per day. One drink is considered to be 12 ounces (355 mL) of beer, 5 ounces (148 mL) of wine, or 1.5 ounces (44 mL) of liquor.  Avoid use of street drugs. Do not share needles with anyone. Ask for help if you need support or instructions about stopping the use of drugs.  High blood pressure causes heart disease and increases the risk of stroke. Blood pressure should be checked at least every 1 to 2 years. Ongoing high blood pressure should be treated  with medicines, if weight loss and exercise are not effective.  If you are 59 to 72 years old, ask your caregiver if you should take aspirin to prevent strokes.  Diabetes screening involves taking a blood sample to check your fasting blood sugar level. This should be done once every 3 years, after age 15, if you are within normal weight and without risk factors for diabetes. Testing should be considered at a younger age or be carried out more frequently if you are overweight and  have at least 1 risk factor for diabetes.  Breast cancer screening is essential preventative care for women. You should practice "breast self-awareness." This means understanding the normal appearance and feel of your breasts and may include breast self-examination. Any changes detected, no matter how small, should be reported to a caregiver. Women in their 41s and 30s should have a clinical breast exam (CBE) by a caregiver as part of a regular health exam every 1 to 3 years. After age 44, women should have a CBE every year. Starting at age 59, women should consider having a mammogram (breast X-ray) every year. Women who have a family history of breast cancer should talk to their caregiver about genetic screening. Women at a high risk of breast cancer should talk to their caregiver about having an MRI and a mammogram every year.  Breast cancer gene (BRCA)-related cancer risk assessment is recommended for women who have family members with BRCA-related cancers. BRCA-related cancers include breast, ovarian, tubal, and peritoneal cancers. Having family members with these cancers may be associated with an increased risk for harmful changes (mutations) in the breast cancer genes BRCA1 and BRCA2. Results of the assessment will determine the need for genetic counseling and BRCA1 and BRCA2 testing.  The Pap test is a screening test for cervical cancer. Women should have a Pap test starting at age 30. Between ages 95 and 10, Pap tests should be repeated every 2 years. Beginning at age 50, you should have a Pap test every 3 years as long as the past 3 Pap tests have been normal. If you had a hysterectomy for a problem that was not cancer or a condition that could lead to cancer, then you no longer need Pap tests. If you are between ages 53 and 2, and you have had normal Pap tests going back 10 years, you no longer need Pap tests. If you have had past treatment for cervical cancer or a condition that could lead to  cancer, you need Pap tests and screening for cancer for at least 20 years after your treatment. If Pap tests have been discontinued, risk factors (such as a new sexual partner) need to be reassessed to determine if screening should be resumed. Some women have medical problems that increase the chance of getting cervical cancer. In these cases, your caregiver may recommend more frequent screening and Pap tests.  The human papillomavirus (HPV) test is an additional test that may be used for cervical cancer screening. The HPV test looks for the virus that can cause the cell changes on the cervix. The cells collected during the Pap test can be tested for HPV. The HPV test could be used to screen women aged 32 years and older, and should be used in women of any age who have unclear Pap test results. After the age of 52, women should have HPV testing at the same frequency as a Pap test.  Colorectal cancer can be detected and often prevented. Most routine colorectal  cancer screening begins at the age of 15 and continues through age 41. However, your caregiver may recommend screening at an earlier age if you have risk factors for colon cancer. On a yearly basis, your caregiver may provide home test kits to check for hidden blood in the stool. Use of a small camera at the end of a tube, to directly examine the colon (sigmoidoscopy or colonoscopy), can detect the earliest forms of colorectal cancer. Talk to your caregiver about this at age 59, when routine screening begins. Direct examination of the colon should be repeated every 5 to 10 years through age 74, unless early forms of pre-cancerous polyps or small growths are found.  Hepatitis C blood testing is recommended for all people born from [birth year] through 1964 and any individual with known risks for hepatitis C.  Practice safe sex. Use condoms and avoid high-risk sexual practices to reduce the spread of sexually transmitted infections (STIs). Sexually active women  aged 77 and younger should be checked for Chlamydia, which is a common sexually transmitted infection. Older women with new or multiple partners should also be tested for Chlamydia. Testing for other STIs is recommended if you are sexually active and at increased risk.  Osteoporosis is a disease in which the bones lose minerals and strength with aging. This can result in serious bone fractures. The risk of osteoporosis can be identified using a bone density scan. Women ages 30 and over and women at risk for fractures or osteoporosis should discuss screening with their caregivers. Ask your caregiver whether you should be taking a calcium supplement or vitamin D to reduce the rate of osteoporosis.  Menopause can be associated with physical symptoms and risks. Hormone replacement therapy is available to decrease symptoms and risks. You should talk to your caregiver about whether hormone replacement therapy is right for you.  Use sunscreen. Apply sunscreen liberally and repeatedly throughout the day. You should seek shade when your shadow is shorter than you. Protect yourself by wearing long sleeves, pants, a wide-brimmed hat, and sunglasses year round, whenever you are outdoors.  Notify your caregiver of new moles or changes in moles, especially if there is a change in shape or color. Also notify your caregiver if a mole is larger than the size of a pencil eraser.  Stay current with your immunizations. Document Released: 12/06/2010 Document Revised: 09/17/2012 Document Reviewed: 12/06/2010 [facility] Patient Information 2014 [place].

## 2013-08-16 LAB — URINALYSIS W MICROSCOPIC + REFLEX CULTURE
Casts: NONE SEEN
Crystals: NONE SEEN
Glucose, UA: NEGATIVE mg/dL
Hgb urine dipstick: NEGATIVE
Nitrite: NEGATIVE
Protein, ur: NEGATIVE mg/dL
Specific Gravity, Urine: 1.026 (ref 1.005–1.030)
Urobilinogen, UA: 0.2 mg/dL (ref 0.0–1.0)
pH: 5 (ref 5.0–8.0)

## 2013-08-17 LAB — URINE CULTURE: Colony Count: 15000

## 2013-08-29 ENCOUNTER — Other Ambulatory Visit: Payer: Self-pay | Admitting: Gynecology

## 2013-10-09 ENCOUNTER — Other Ambulatory Visit: Payer: Self-pay

## 2013-10-09 MED ORDER — PROGESTERONE MICRONIZED 100 MG PO CAPS: ORAL_CAPSULE | ORAL | Status: DC

## 2014-04-18 ENCOUNTER — Telehealth: Payer: Self-pay | Admitting: *Deleted

## 2014-04-18 NOTE — Telephone Encounter (Signed)
[facility] Will wait for response.

## 2014-04-21 NOTE — Telephone Encounter (Signed)
Rx for estradiol approved through 04/19/2015

## 2014-07-10 ENCOUNTER — Other Ambulatory Visit: Payer: Self-pay

## 2014-07-10 DIAGNOSIS — Z1231 Encounter for screening mammogram for malignant neoplasm of breast: Secondary | ICD-10-CM

## 2014-08-15 ENCOUNTER — Ambulatory Visit
Admission: RE | Admit: 2014-08-15 | Discharge: 2014-08-15 | Disposition: A | Discharge status: Home / Self Care | Payer: Medicare Other | Source: Ambulatory Visit

## 2014-08-15 ENCOUNTER — Encounter (INDEPENDENT_AMBULATORY_CARE_PROVIDER_SITE_OTHER): Payer: Self-pay

## 2014-08-15 DIAGNOSIS — Z1231 Encounter for screening mammogram for malignant neoplasm of breast: Secondary | ICD-10-CM

## 2014-08-22 ENCOUNTER — Encounter: Payer: Self-pay | Admitting: Gynecology

## 2014-08-22 ENCOUNTER — Ambulatory Visit (INDEPENDENT_AMBULATORY_CARE_PROVIDER_SITE_OTHER): Payer: Medicare Other | Admitting: Gynecology

## 2014-08-22 VITALS — BP 124/80 | Ht 66.0 in | Wt 177.0 lb

## 2014-08-22 DIAGNOSIS — L900 Lichen sclerosus et atrophicus: Secondary | ICD-10-CM | POA: Diagnosis not present

## 2014-08-22 DIAGNOSIS — Z79890 Hormone replacement therapy (postmenopausal): Secondary | ICD-10-CM

## 2014-08-22 DIAGNOSIS — Z01419 Encounter for gynecological examination (general) (routine) without abnormal findings: Secondary | ICD-10-CM

## 2014-08-22 DIAGNOSIS — N952 Postmenopausal atrophic vaginitis: Secondary | ICD-10-CM | POA: Diagnosis not present

## 2014-08-22 NOTE — Patient Instructions (Signed)
Continue to wean off of your hormone replacement. Call me if you have any issues with this. Call me if you have any bleeding.  You may obtain a copy of any labs that were done today by logging onto MyChart as outlined in the instructions provided with your AVS (after visit summary). The office will not call with normal lab results but certainly if there are any significant abnormalities then we will contact you.   Health Maintenance, Female A healthy lifestyle and preventative care can promote health and wellness.  Maintain regular health, dental, and eye exams.  Eat a healthy diet. Foods like vegetables, fruits, whole grains, low-fat dairy products, and lean protein foods contain the nutrients you need without too many calories. Decrease your intake of foods high in solid fats, added sugars, and salt. Get information about a proper diet from your caregiver, if necessary.  Regular physical exercise is one of the most important things you can do for your health. Most adults should get at least 150 minutes of moderate-intensity exercise (any activity that increases your heart rate and causes you to sweat) each week. In addition, most adults need muscle-strengthening exercises on 2 or more days a week.   Maintain a healthy weight. The body mass index (BMI) is a screening tool to identify possible weight problems. It provides an estimate of body fat based on height and weight. Your caregiver can help determine your BMI, and can help you achieve or maintain a healthy weight. For adults 20 years and older:  A BMI below 18.5 is considered underweight.  A BMI of 18.5 to 24.9 is normal.  A BMI of 25 to 29.9 is considered overweight.  A BMI of 30 and above is considered obese.  Maintain normal blood lipids and cholesterol by exercising and minimizing your intake of saturated fat. Eat a balanced diet with plenty of fruits and vegetables. Blood tests for lipids and cholesterol should begin at age 65 and  be repeated every 5 years. If your lipid or cholesterol levels are high, you are over 50, or you are a high risk for heart disease, you may need your cholesterol levels checked more frequently.Ongoing high lipid and cholesterol levels should be treated with medicines if diet and exercise are not effective.  If you smoke, find out from your caregiver how to quit. If you do not use tobacco, do not start.  Lung cancer screening is recommended for adults aged 19 80 years who are at high risk for developing lung cancer because of a history of smoking. Yearly low-dose computed tomography (CT) is recommended for people who have at least a 30-pack-year history of smoking and are a current smoker or have quit within the past 15 years. A pack year of smoking is smoking an average of 1 pack of cigarettes a day for 1 year (for example: 1 pack a day for 30 years or 2 packs a day for 15 years). Yearly screening should continue until the smoker has stopped smoking for at least 15 years. Yearly screening should also be stopped for people who develop a health problem that would prevent them from having lung cancer treatment.  If you are pregnant, do not drink alcohol. If you are breastfeeding, be very cautious about drinking alcohol. If you are not pregnant and choose to drink alcohol, do not exceed 1 drink per day. One drink is considered to be 12 ounces (355 mL) of beer, 5 ounces (148 mL) of wine, or 1.5 ounces (44 mL)  of liquor.  Avoid use of street drugs. Do not share needles with anyone. Ask for help if you need support or instructions about stopping the use of drugs.  High blood pressure causes heart disease and increases the risk of stroke. Blood pressure should be checked at least every 1 to 2 years. Ongoing high blood pressure should be treated with medicines, if weight loss and exercise are not effective.  If you are 69 to 73 years old, ask your caregiver if you should take aspirin to prevent  strokes.  Diabetes screening involves taking a blood sample to check your fasting blood sugar level. This should be done once every 3 years, after age 51, if you are within normal weight and without risk factors for diabetes. Testing should be considered at a younger age or be carried out more frequently if you are overweight and have at least 1 risk factor for diabetes.  Breast cancer screening is essential preventative care for women. You should practice "breast self-awareness." This means understanding the normal appearance and feel of your breasts and may include breast self-examination. Any changes detected, no matter how small, should be reported to a caregiver. Women in their 33s and 30s should have a clinical breast exam (CBE) by a caregiver as part of a regular health exam every 1 to 3 years. After age 48, women should have a CBE every year. Starting at age 77, women should consider having a mammogram (breast X-ray) every year. Women who have a family history of breast cancer should talk to their caregiver about genetic screening. Women at a high risk of breast cancer should talk to their caregiver about having an MRI and a mammogram every year.  Breast cancer gene (BRCA)-related cancer risk assessment is recommended for women who have family members with BRCA-related cancers. BRCA-related cancers include breast, ovarian, tubal, and peritoneal cancers. Having family members with these cancers may be associated with an increased risk for harmful changes (mutations) in the breast cancer genes BRCA1 and BRCA2. Results of the assessment will determine the need for genetic counseling and BRCA1 and BRCA2 testing.  The Pap test is a screening test for cervical cancer. Women should have a Pap test starting at age 96. Between ages 29 and 61, Pap tests should be repeated every 2 years. Beginning at age 28, you should have a Pap test every 3 years as long as the past 3 Pap tests have been normal. If you had a  hysterectomy for a problem that was not cancer or a condition that could lead to cancer, then you no longer need Pap tests. If you are between ages 35 and 54, and you have had normal Pap tests going back 10 years, you no longer need Pap tests. If you have had past treatment for cervical cancer or a condition that could lead to cancer, you need Pap tests and screening for cancer for at least 20 years after your treatment. If Pap tests have been discontinued, risk factors (such as a new sexual partner) need to be reassessed to determine if screening should be resumed. Some women have medical problems that increase the chance of getting cervical cancer. In these cases, your caregiver may recommend more frequent screening and Pap tests.  The human papillomavirus (HPV) test is an additional test that may be used for cervical cancer screening. The HPV test looks for the virus that can cause the cell changes on the cervix. The cells collected during the Pap test can be tested  for HPV. The HPV test could be used to screen women aged 32 years and older, and should be used in women of any age who have unclear Pap test results. After the age of 35, women should have HPV testing at the same frequency as a Pap test.  Colorectal cancer can be detected and often prevented. Most routine colorectal cancer screening begins at the age of 28 and continues through age 74. However, your caregiver may recommend screening at an earlier age if you have risk factors for colon cancer. On a yearly basis, your caregiver may provide home test kits to check for hidden blood in the stool. Use of a small camera at the end of a tube, to directly examine the colon (sigmoidoscopy or colonoscopy), can detect the earliest forms of colorectal cancer. Talk to your caregiver about this at age 62, when routine screening begins. Direct examination of the colon should be repeated every 5 to 10 years through age 51, unless early forms of pre-cancerous  polyps or small growths are found.  Hepatitis C blood testing is recommended for all people born from [birth year] through 1964 and any individual with known risks for hepatitis C.  Practice safe sex. Use condoms and avoid high-risk sexual practices to reduce the spread of sexually transmitted infections (STIs). Sexually active women aged 59 and younger should be checked for Chlamydia, which is a common sexually transmitted infection. Older women with new or multiple partners should also be tested for Chlamydia. Testing for other STIs is recommended if you are sexually active and at increased risk.  Osteoporosis is a disease in which the bones lose minerals and strength with aging. This can result in serious bone fractures. The risk of osteoporosis can be identified using a bone density scan. Women ages 43 and over and women at risk for fractures or osteoporosis should discuss screening with their caregivers. Ask your caregiver whether you should be taking a calcium supplement or vitamin D to reduce the rate of osteoporosis.  Menopause can be associated with physical symptoms and risks. Hormone replacement therapy is available to decrease symptoms and risks. You should talk to your caregiver about whether hormone replacement therapy is right for you.  Use sunscreen. Apply sunscreen liberally and repeatedly throughout the day. You should seek shade when your shadow is shorter than you. Protect yourself by wearing long sleeves, pants, a wide-brimmed hat, and sunglasses year round, whenever you are outdoors.  Notify your caregiver of new moles or changes in moles, especially if there is a change in shape or color. Also notify your caregiver if a mole is larger than the size of a pencil eraser.  Stay current with your immunizations. Document Released: 12/06/2010 Document Revised: 09/17/2012 Document Reviewed: 12/06/2010 [facility] Patient Information 2014 [place].

## 2014-08-22 NOTE — Progress Notes (Signed)
Kristen MontgomeryMartha E Blake 07/25/1940 [id number]        74 y.o.  G0P0 for breast and pelvic exam  Past medical history,surgical history, problem list, medications, allergies, family history and social history were all reviewed and documented as reviewed in the EPIC chart.  ROS:  Performed with pertinent positives and negatives included in the history, assessment and plan.   Additional significant findings :  none   Exam: [name] personassistant Filed Vitals:   08/22/14 0911  BP: 124/80  Height: 5\' 6"  (1.676 m)  Weight: 177 lb (80.287 kg)   General appearance:  Normal affect, orientation and appearance. Skin: Grossly normal HEENT: Without gross lesions.  No cervical or supraclavicular adenopathy. Thyroid normal.  Lungs:  Clear without wheezing, rales or rhonchi Cardiac: RR, without RMG Abdominal:  Soft, nontender, without masses, guarding, rebound, organomegaly or hernia Breasts:  Examined lying and sitting without masses, retractions, discharge or axillary adenopathy. Pelvic:  Ext/BUS/vagina with atrophic changes. Pearly white skin changes from upper labia to perineal body consistent with lichen sclerosus. Vaginal opening compromise consistent with her virginal/atrophic changes.  Cervix atrophic  Uterus small difficult to palpate. No gross masses or tenderness.  Adnexa  Without gross masses or tenderness    Anus and perineum  Normal   Rectovaginal  Normal sphincter tone without palpated masses or tenderness.    Assessment/Plan:  74 y.o. G0P0 female for breast and pelvic exam.   1. Postmenopausal/atrophic genital changes/HRT. Patient weaning from her HRT. Takes estradiol 0.5 mg and Prometrium 100 mg 3 times weekly. We'll continue to wean until she is off of this. I again reviewed the whole issue of HRT, advancing age, ACOG and NAMS statements for lowest dose for shortest period of time. Risks of stroke heart attack DVT and breast cancer reviewed. No vaginal bleeding. Patient knows to report any vaginal  bleeding or difficulty weaning. Has supply at home will call if she needs more. 2. Lichen sclerosus. Uses Temovate 0.05% cream intermittently. Does not use it very often. Has supply at home and will call when needs. 3. Mammography 08/2014. Continue with annual mammography. SBE monthly reviewed. 4. Pap smear 2012. No Pap smear done today. No history of abnormal Pap smears previously. We both agree per current screening guidelines to stop screening as she is over the age of 73. 5. Colonoscopy 2013. Repeat at their recommended interval. 6. DEXA reported 2013. Done through Dr. Lanell MatarAronson's office. I do not have copies of these results. She'll continue to follow up with him for bone health monitoring and treatment as needed.  Increased calcium vitamin D reviewed. 7. Health maintenance. No routine blood work done as she reports is done at her primary physician's office. Follow up in one year, sooner as needed.     Dara LordsFONTAINE,Ciera Beckum P MD, 9:53 AM 08/22/2014

## 2014-08-23 LAB — URINALYSIS W MICROSCOPIC + REFLEX CULTURE
Bilirubin Urine: NEGATIVE
Casts: NONE SEEN
Crystals: NONE SEEN
Glucose, UA: NEGATIVE mg/dL
Hgb urine dipstick: NEGATIVE
Ketones, ur: NEGATIVE mg/dL
Nitrite: NEGATIVE
Protein, ur: NEGATIVE mg/dL
Specific Gravity, Urine: 1.02 (ref 1.005–1.030)
Urobilinogen, UA: 0.2 mg/dL (ref 0.0–1.0)
pH: 5.5 (ref 5.0–8.0)

## 2014-08-24 ENCOUNTER — Other Ambulatory Visit: Payer: Self-pay | Admitting: Gynecology

## 2014-08-24 LAB — URINE CULTURE: Colony Count: 6000

## 2014-10-17 ENCOUNTER — Other Ambulatory Visit: Payer: Self-pay | Admitting: Gynecology

## 2014-10-17 NOTE — Telephone Encounter (Signed)
08-22-2014  Assessment/Plan: 74 y.o. G0P0 female for breast and pelvic exam.   1. Postmenopausal/atrophic genital changes/HRT. Patient weaning from her HRT. Takes estradiol 0.5 mg and Prometrium 100 mg 3 times weekly. We'll continue to wean until she is off of this. I again reviewed the whole issue of HRT, advancing age, ACOG and NAMS statements for lowest dose for shortest period of time. Risks of stroke heart attack DVT and breast cancer reviewed. No vaginal bleeding. Patient knows to report any vaginal bleeding or difficulty weaning. Has supply at home will call if she needs more.

## 2015-02-13 ENCOUNTER — Other Ambulatory Visit: Payer: Self-pay | Admitting: Gynecology

## 2015-07-20 ENCOUNTER — Other Ambulatory Visit: Payer: Self-pay | Admitting: Gynecology

## 2015-07-20 NOTE — Telephone Encounter (Signed)
Per note in march 2016   1. Postmenopausal/atrophic genital changes/HRT. Patient weaning from her HRT. Takes estradiol 0.5 mg and Prometrium 100 mg 3 times weekly. We'll continue to wean until she is off of this. I again reviewed the whole issue of HRT, advancing age, ACOG and NAMS statements for lowest dose for shortest period of time. Risks of stroke heart attack DVT and breast cancer reviewed. No vaginal bleeding. Patient knows to report any vaginal bleeding or difficulty weaning. Has supply at home will call if she needs more

## 2015-09-08 ENCOUNTER — Other Ambulatory Visit: Payer: Self-pay

## 2015-09-08 DIAGNOSIS — Z1231 Encounter for screening mammogram for malignant neoplasm of breast: Secondary | ICD-10-CM

## 2015-09-18 ENCOUNTER — Encounter: Payer: Medicare Other | Admitting: Cardiology

## 2015-09-29 ENCOUNTER — Ambulatory Visit: Payer: Medicare Other

## 2015-10-18 ENCOUNTER — Other Ambulatory Visit: Payer: Self-pay | Admitting: Gynecology

## 2015-11-01 ENCOUNTER — Other Ambulatory Visit: Payer: Self-pay | Admitting: Gynecology

## 2015-11-03 ENCOUNTER — Other Ambulatory Visit: Payer: Self-pay

## 2015-11-03 MED ORDER — ESTRADIOL 0.5 MG PO TABS: 0.5000 mg | ORAL_TABLET | Freq: Every day | ORAL | Status: DC

## 2015-11-12 ENCOUNTER — Ambulatory Visit
Admission: RE | Admit: 2015-11-12 | Discharge: 2015-11-12 | Disposition: A | Discharge status: Home / Self Care | Payer: Medicare Other | Source: Ambulatory Visit

## 2015-11-12 DIAGNOSIS — Z1231 Encounter for screening mammogram for malignant neoplasm of breast: Secondary | ICD-10-CM

## 2015-11-26 ENCOUNTER — Encounter: Payer: Self-pay | Admitting: Interventional Cardiology

## 2015-12-08 DIAGNOSIS — R55 Syncope and collapse: Secondary | ICD-10-CM | POA: Insufficient documentation

## 2015-12-08 DIAGNOSIS — I10 Essential (primary) hypertension: Secondary | ICD-10-CM | POA: Insufficient documentation

## 2015-12-08 DIAGNOSIS — I471 Supraventricular tachycardia: Secondary | ICD-10-CM | POA: Insufficient documentation

## 2015-12-08 DIAGNOSIS — M5136 Other intervertebral disc degeneration, lumbar region: Secondary | ICD-10-CM | POA: Insufficient documentation

## 2015-12-08 NOTE — Progress Notes (Signed)
Cardiology Office Note    Date:  12/09/2015   ID:  Kristen MontgomeryMartha E Blake, DOB 10/13/1940, MRN [id number]  PCP:  Minda MeoARONSON,RICHARD A, MD  Cardiologist: Lesleigh NoeHenry W Meegan Shanafelt III, MD   Chief Complaint  Patient presents with  . Palpitations  . Follow-up    carotid bulge    History of Present Illness:  [name] is a 75 y.o. female for evaluation of palpitations. She has a prior history of syncope. No previous documentation of cardiovascular disease.  The patient documents a history of awareness of irregular heartbeat/palpitations approximately one month ago. This occurred after some heavy physical activity. It is not recurred since that time. She denies episodes of syncope. There was no chest discomfort, dyspnea, orthopnea, or any other associated acute event. She has not had orthopnea. She has left lower extremity greater than right lower extremity edema from time to time. This is been chronic. She is not diabetic and has never smoked cigarettes.  There is a history of hypertension and hyperlipidemia both of which are being treated with medication by Dr. Jacky KindleAronson.    Past Medical History  Diagnosis Date  . High cholesterol   . Hypertension   . Arthritis   . Anxiety     Past Surgical History  Procedure Laterality Date  . Right breast biopsy  72    two biopsies several years apart  . Gallbladder removed  2010  . Right knee replacement   2011  . Lumbar fusion  09/2010  . Breast surgery      Biopsy  . Tonsillectomy and adenoidectomy    . Bunionectomy      Current Medications: Outpatient Prescriptions Prior to Visit  Medication Sig Dispense Refill  . clobetasol ointment (TEMOVATE) 0.05 % Apply topically 2 (two) times daily. As needed for itching 30 g 1  . enalapril (VASOTEC) 20 MG tablet Take 20 mg by mouth 2 (two) times daily.     Marland Kitchen. estradiol (ESTRACE) 0.5 MG tablet Take 1 tablet (0.5 mg total) by mouth daily. 30 tablet 1  . meloxicam (MOBIC) 15 MG tablet Take 15 mg by mouth daily.       . pravastatin (PRAVACHOL) 20 MG tablet Take 20 mg by mouth daily.      . progesterone (PROMETRIUM) 100 MG capsule TAKE ONE CAPSULE BY MOUTH EVERY DAY AT BEDTIME 90 capsule 0  . sertraline (ZOLOFT) 50 MG tablet Take 50 mg by mouth daily.       No facility-administered medications prior to visit.     Allergies:   Review of patient's allergies indicates no known allergies.   Social History   Social History  . Marital Status: Single    Spouse Name: N/A  . Number of Children: N/A  . Years of Education: N/A   Social History Main Topics  . Smoking status: Never Smoker   . Smokeless tobacco: Never Used  . Alcohol Use: No  . Drug Use: No  . Sexual Activity: No     Comment: Virgin   Other Topics Concern  . None   Social History Narrative     Family History:  The patient's Father had hypertension family history includes Cancer in her father; Diabetes in her father; Stroke in her mother.   ROS:   Please see the history of present illness.    She has chronic lumbar disc disease and back pain. Easy bruising and cough occurs. She occasionally awakens at night feeling somewhat short of breath. other systems reviewed and  are negative.   PHYSICAL EXAM:   VS:  BP 138/88 mmHg  Pulse 73  Ht  (1.676 m)  Wt 185 lb (83.915 kg)  BMI 29.87 kg/m2  LMP 06/06/1996   GEN: Well nourished, well developed, in no acute distress HEENT: normal Neck: no JVD or masses. There is pulsation in the base of the right neck. This is not arterial pulsation and overlying auscultation demonstrates a bruit. Cardiac: RRR; no murmurs, rubs, or gallops. There is trace right and slightly greater than left lower extremity edema. Respiratory:  clear to auscultation bilaterally, normal work of breathing GI: soft, nontender, nondistended, + BS MS: no deformity or atrophy Skin: warm and dry, no rash Neuro:  Alert and Oriented x 3, Strength and sensation are intact Psych: euthymic mood, full affect  Wt Readings  from Last 3 Encounters:  12/09/15 185 lb (83.915 kg)  08/22/14 177 lb (80.287 kg)  08/15/13 180 lb (81.647 kg)      Studies/Labs Reviewed:   EKG:  EKG  Normal sinus rhythm with left axis deviation and incomplete right bundle branch block. Generalized poor all wave progression is noted.  Recent Labs: No results found for requested labs within last 365 days.   Lipid Panel No results found for: CHOL, TRIG, HDL, CHOLHDL, VLDL, LDLCALC, LDLDIRECT  Additional studies/ records that were reviewed today include:  No prior cardiac data workup has been done. Lipids are suboptimally managed with the most recent LDL of 1:30 on low-dose pravastatin. Total cholesterol is 200. Kidney function is normal with a creatinine of 0.7. This data is based upon labs performed on 11/26/15.   ASSESSMENT:    1. Right carotid bruit   2. Palpitations   3. Essential hypertension   4. High cholesterol   5. Paroxysmal supraventricular tachycardia (HCC)   6. Bruit      PLAN:  In order of problems listed above:  1. Bilateral carotid Doppler with right carotid duplex to exclude atherosclerosis and right carotid aneurysm 2. 48-hour Holter monitor would be performed to rule out occult paroxysmal atrial fibrillation. 3. Low salt diet and weight control or recommended 4. If significant atherosclerosis is noted in the carotids, LDL control should be tightened to evaluate less than 100 rather than the current value of near 130.    Medication Adjustments/Labs and Tests Ordered: Current medicines are reviewed at length with the patient today.  Concerns regarding medicines are outlined above.  Medication changes, Labs and Tests ordered today are listed in the Patient Instructions below. Patient Instructions  Medication Instructions:  Your physician recommends that you continue on your current medications as directed. Please refer to the Current Medication list given to you today.   Labwork: None  ordered  Testing/Procedures: Your physician has requested that you have a carotid duplex. This test is an ultrasound of the carotid arteries in your neck. It looks at blood flow through these arteries that supply the brain with blood. Allow one hour for this exam. There are no restrictions or special instructions.  Your physician has recommended that you wear a holter monitor. Holter monitors are medical devices that record the heart's electrical activity. Doctors most often use these monitors to diagnose arrhythmias. Arrhythmias are problems with the speed or rhythm of the heartbeat. The monitor is a small, portable device. You can wear one while you do your normal daily activities. This is usually used to diagnose what is causing palpitations/syncope (passing out).   Follow-Up: Your physician recommends that you schedule a  follow-up appointment pending test results  Any Other Special Instructions Will Be Listed Below (If Applicable).     If you need a refill on your cardiac medications before your next appointment, please call your pharmacy.      Signed, Lesleigh NoeHenry W Avanelle Pixley III, MD  12/09/2015 8:36 AM    [facility] [address], [place], [place]  [address] Phone: [phone]; Fax: [phone]

## 2015-12-09 ENCOUNTER — Encounter: Payer: Self-pay | Admitting: Interventional Cardiology

## 2015-12-09 ENCOUNTER — Ambulatory Visit (INDEPENDENT_AMBULATORY_CARE_PROVIDER_SITE_OTHER): Payer: Medicare Other | Admitting: Interventional Cardiology

## 2015-12-09 ENCOUNTER — Ambulatory Visit (INDEPENDENT_AMBULATORY_CARE_PROVIDER_SITE_OTHER): Payer: Medicare Other

## 2015-12-09 VITALS — BP 138/88 | HR 73 | Ht 66.0 in | Wt 185.0 lb

## 2015-12-09 DIAGNOSIS — I10 Essential (primary) hypertension: Secondary | ICD-10-CM

## 2015-12-09 DIAGNOSIS — R0989 Other specified symptoms and signs involving the circulatory and respiratory systems: Secondary | ICD-10-CM

## 2015-12-09 DIAGNOSIS — I471 Supraventricular tachycardia: Secondary | ICD-10-CM | POA: Diagnosis not present

## 2015-12-09 DIAGNOSIS — E7800 Pure hypercholesterolemia, unspecified: Secondary | ICD-10-CM

## 2015-12-09 DIAGNOSIS — R002 Palpitations: Secondary | ICD-10-CM

## 2015-12-09 NOTE — Patient Instructions (Signed)
Medication Instructions:  Your physician recommends that you continue on your current medications as directed. Please refer to the Current Medication list given to you today.   Labwork: None ordered  Testing/Procedures: Your physician has requested that you have a carotid duplex. This test is an ultrasound of the carotid arteries in your neck. It looks at blood flow through these arteries that supply the brain with blood. Allow one hour for this exam. There are no restrictions or special instructions.  Your physician has recommended that you wear a holter monitor. Holter monitors are medical devices that record the heart's electrical activity. Doctors most often use these monitors to diagnose arrhythmias. Arrhythmias are problems with the speed or rhythm of the heartbeat. The monitor is a small, portable device. You can wear one while you do your normal daily activities. This is usually used to diagnose what is causing palpitations/syncope (passing out).   Follow-Up: Your physician recommends that you schedule a follow-up appointment pending test results  Any Other Special Instructions Will Be Listed Below (If Applicable).     If you need a refill on your cardiac medications before your next appointment, please call your pharmacy.

## 2015-12-11 ENCOUNTER — Ambulatory Visit (HOSPITAL_COMMUNITY)
Admission: RE | Admit: 2015-12-11 | Discharge: 2015-12-11 | Disposition: A | Discharge status: Home / Self Care | Payer: Medicare Other | Source: Ambulatory Visit | Attending: Cardiology | Admitting: Cardiology

## 2015-12-11 DIAGNOSIS — I6523 Occlusion and stenosis of bilateral carotid arteries: Secondary | ICD-10-CM | POA: Diagnosis not present

## 2015-12-11 DIAGNOSIS — E7800 Pure hypercholesterolemia, unspecified: Secondary | ICD-10-CM | POA: Diagnosis not present

## 2015-12-11 DIAGNOSIS — F419 Anxiety disorder, unspecified: Secondary | ICD-10-CM | POA: Insufficient documentation

## 2015-12-11 DIAGNOSIS — R0989 Other specified symptoms and signs involving the circulatory and respiratory systems: Secondary | ICD-10-CM | POA: Diagnosis not present

## 2015-12-11 DIAGNOSIS — I10 Essential (primary) hypertension: Secondary | ICD-10-CM | POA: Insufficient documentation

## 2015-12-17 ENCOUNTER — Other Ambulatory Visit: Payer: Self-pay | Admitting: Neurological Surgery

## 2015-12-17 DIAGNOSIS — M5417 Radiculopathy, lumbosacral region: Secondary | ICD-10-CM

## 2015-12-21 ENCOUNTER — Other Ambulatory Visit: Payer: Self-pay | Admitting: Neurological Surgery

## 2015-12-21 ENCOUNTER — Ambulatory Visit
Admission: RE | Admit: 2015-12-21 | Discharge: 2015-12-21 | Disposition: A | Discharge status: Home / Self Care | Payer: Medicare Other | Source: Ambulatory Visit | Attending: Neurological Surgery | Admitting: Neurological Surgery

## 2015-12-21 DIAGNOSIS — M5417 Radiculopathy, lumbosacral region: Secondary | ICD-10-CM

## 2015-12-21 MED ORDER — METHYLPREDNISOLONE ACETATE 40 MG/ML INJ SUSP (RADIOLOG
120.0000 mg | Freq: Once | INTRAMUSCULAR | Status: AC
  Administered 2015-12-21: 120 mg via EPIDURAL

## 2015-12-21 MED ORDER — IOPAMIDOL (ISOVUE-M 200) INJECTION 41%
1.0000 mL | Freq: Once | INTRAMUSCULAR | Status: AC
  Administered 2015-12-21: 1 mL via EPIDURAL

## 2015-12-21 NOTE — Discharge Instructions (Signed)

## 2015-12-28 ENCOUNTER — Encounter: Payer: Medicare Other | Admitting: Gynecology

## 2016-01-01 ENCOUNTER — Other Ambulatory Visit: Payer: Self-pay | Admitting: Gynecology

## 2016-01-14 ENCOUNTER — Ambulatory Visit (INDEPENDENT_AMBULATORY_CARE_PROVIDER_SITE_OTHER): Payer: Medicare Other | Admitting: Gynecology

## 2016-01-14 ENCOUNTER — Encounter: Payer: Self-pay | Admitting: Gynecology

## 2016-01-14 VITALS — BP 124/78 | Ht 66.0 in | Wt 180.0 lb

## 2016-01-14 DIAGNOSIS — N952 Postmenopausal atrophic vaginitis: Secondary | ICD-10-CM | POA: Diagnosis not present

## 2016-01-14 DIAGNOSIS — N750 Cyst of Bartholin's gland: Secondary | ICD-10-CM

## 2016-01-14 DIAGNOSIS — Z01419 Encounter for gynecological examination (general) (routine) without abnormal findings: Secondary | ICD-10-CM | POA: Diagnosis not present

## 2016-01-14 DIAGNOSIS — Z9229 Personal history of other drug therapy: Secondary | ICD-10-CM

## 2016-01-14 MED ORDER — ESTRADIOL 0.5 MG PO TABS: 0.5000 mg | ORAL_TABLET | Freq: Every day | ORAL | 12 refills | Status: DC

## 2016-01-14 MED ORDER — PROGESTERONE MICRONIZED 100 MG PO CAPS: 100.0000 mg | ORAL_CAPSULE | Freq: Every day | ORAL | 3 refills | Status: DC

## 2016-01-14 NOTE — Progress Notes (Signed)
Kristen MontgomeryMartha E Blake 01/20/1941 [id number]        75 y.o.  G0P0  for breast and pelvic exam.  Several issues noted below  Past medical history,surgical history, problem list, medications, allergies, family history and social history were all reviewed and documented as reviewed in the EPIC chart.  ROS:  Performed with pertinent positives and negatives included in the history, assessment and plan.   Additional significant findings :  Left labial area noted on orthopedic MRI   Exam: Kennon PortelaKim Gardner assistant Vitals:   01/14/16 0928  BP: 124/78  Weight: 180 lb (81.6 kg)  Height: 5\' 6"  (1.676 m)   Body mass index is 29.05 kg/m.  General appearance:  Normal affect, orientation and appearance. Skin: Grossly normal HEENT: Without gross lesions.  No cervical or supraclavicular adenopathy. Thyroid normal.  Lungs:  Clear without wheezing, rales or rhonchi Cardiac: RR, without RMG Abdominal:  Soft, nontender, without masses, guarding, rebound, organomegaly or hernia Breasts:  Examined lying and sitting without masses, retractions, discharge or axillary adenopathy. Pelvic:  Ext/BUS/Vagina With atrophic changes.  White skin changes due to lichen sclerosus previously noted less evident today.  2 cm left Bartholin's cyst noted, nontender  Cervix atrophic  Uterus difficult to palpate but no gross masses or tenderness  Adnexa without gross masses or tenderness    Anus and perineum normal   Rectovaginal normal sphincter tone without palpated masses or tenderness.    Assessment/Plan:  75 y.o. G0P0 female for annual exam.   1. Postmenopausal/HRT. Patient had tried weaning from her HRT but had unacceptable hot flushes and sweats. She now is taking her estradiol 0.5 mg 3 days weekly along with her Prometrium 100 mg nightly. I reviewed the most current 2017 [facility] HRT guidelines. Benefits of symptom relief, possible bone health and cardiovascular support when started early versus risks to include thrombosis  such as stroke heart attack DVT which may increase with age as well as breast cancer risks reviewed. At this point patient feels is a quality-of-life issue and prefers to continue. She clearly understands the issues and risks. Refill 1 year provided. Patient has done no bleeding and she knows to report any vaginal bleeding. 2. Lichen sclerosus. Less evident on exam today. Uses Temovate 0.05% cream intermittently as needed. Has supply at home but will call when needs refill. 3.  Left Bartholin's cyst noted on MRI. Patient does have a history of what sounds to be Bartholin's abscess that had to be lanced years ago. Cyst was not evident on prior exams. Classic in appearance. I reviewed the anatomy with her. I did recommend follow up exam in 2-3 months just to document stability. She'll follow up sooner if it enlarges at all. 4. Mammography 11/2015. Continue with annual mammography when due. SBE monthly reviewed. 5. Colonoscopy 2013. Repeat at their recommended interval. 6. DEXA 2013. Follow up with her primary physician who follows her for her bone health. 7. Pap smear 2012. No Pap smear done today. No history of abnormal Pap smears previously. Per current screening guidelines we both agree to stop screening based on age. 8. Health maintenance. No routine lab work done as patient has this done elsewhere. Follow up 2-3 months for reexamination of Bartholin's cyst. Otherwise annual exam in one year   15 minutes of my time in excess of her breast and pelvic exam was spent in direct face to face counseling and coordination of care in regards to her problems of new [facility] 2017 HRT guidelines and her  Bartholin's cyst.    [name] MD, 9:50 AM 01/14/2016

## 2016-01-14 NOTE — Patient Instructions (Signed)
Follow up in several months for reexamination of the Bartholin's cyst.

## 2016-03-14 ENCOUNTER — Other Ambulatory Visit: Payer: Self-pay | Admitting: Neurological Surgery

## 2016-03-14 DIAGNOSIS — M5417 Radiculopathy, lumbosacral region: Secondary | ICD-10-CM

## 2016-03-15 ENCOUNTER — Ambulatory Visit
Admission: RE | Admit: 2016-03-15 | Discharge: 2016-03-15 | Disposition: A | Discharge status: Home / Self Care | Payer: Medicare Other | Source: Ambulatory Visit | Attending: Neurological Surgery | Admitting: Neurological Surgery

## 2016-03-15 ENCOUNTER — Other Ambulatory Visit: Payer: Self-pay | Admitting: Neurological Surgery

## 2016-03-15 DIAGNOSIS — M5417 Radiculopathy, lumbosacral region: Secondary | ICD-10-CM

## 2016-03-15 MED ORDER — IOPAMIDOL (ISOVUE-M 200) INJECTION 41%
1.0000 mL | Freq: Once | INTRAMUSCULAR | Status: AC
  Administered 2016-03-15: 1 mL via EPIDURAL

## 2016-03-15 MED ORDER — METHYLPREDNISOLONE ACETATE 40 MG/ML INJ SUSP (RADIOLOG
120.0000 mg | Freq: Once | INTRAMUSCULAR | Status: AC
  Administered 2016-03-15: 120 mg via EPIDURAL

## 2016-03-15 NOTE — Discharge Instructions (Signed)

## 2016-04-15 ENCOUNTER — Other Ambulatory Visit: Payer: Self-pay

## 2016-04-15 ENCOUNTER — Ambulatory Visit (INDEPENDENT_AMBULATORY_CARE_PROVIDER_SITE_OTHER): Payer: Medicare Other | Admitting: Gynecology

## 2016-04-15 ENCOUNTER — Encounter: Payer: Self-pay | Admitting: Gynecology

## 2016-04-15 VITALS — BP 124/76

## 2016-04-15 DIAGNOSIS — N750 Cyst of Bartholin's gland: Secondary | ICD-10-CM | POA: Diagnosis not present

## 2016-04-15 MED ORDER — ESTRADIOL 0.5 MG PO TABS: 0.5000 mg | ORAL_TABLET | Freq: Every day | ORAL | 2 refills | Status: DC

## 2016-04-15 NOTE — Patient Instructions (Signed)
Follow up if the Bartholin's cyst causes you any discomfort or enlarges. Otherwise follow up August 2015 for annual exam.

## 2016-04-15 NOTE — Progress Notes (Signed)
    Kristen MontgomeryMartha E Blake 06/04/1941 [id number]        75 y.o.  G0P0 presents for follow up of her left Bartholin's cyst that was discovered at her annual exam in August. It causes her no discomfort and has not changed to her self-exam.  Past medical history,surgical history, problem list, medications, allergies, family history and social history were all reviewed and documented in the EPIC chart.  Directed ROS with pertinent positives and negatives documented in the history of present illness/assessment and plan.  Exam: Kennon PortelaKim Gardner assistant Vitals:   04/15/16 0923  BP: 124/76   General appearance:  Normal External BUS vagina with atrophic changes and blanching consistent with her history of lichen sclerosis. 2 cm soft nontender left classic Bartholin cyst noted  Assessment/Plan:  75 y.o. G0P0 stable left Bartholin's cyst not bothersome to the patient. Patient will follow expectantly and if it enlarges or causes discomfort she'll follow up our reevaluation. If it remains same our results and she'll follow up in August 2019 when due for annual exam.    Dara LordsFONTAINE,Brycen Bean P MD, 9:45 AM 04/15/2016

## 2016-06-06 DIAGNOSIS — Z8619 Personal history of other infectious and parasitic diseases: Secondary | ICD-10-CM

## 2016-06-06 HISTORY — DX: Personal history of other infectious and parasitic diseases: Z86.19

## 2016-06-10 ENCOUNTER — Other Ambulatory Visit: Payer: Self-pay | Admitting: Neurological Surgery

## 2016-06-10 DIAGNOSIS — M5417 Radiculopathy, lumbosacral region: Secondary | ICD-10-CM

## 2016-06-16 ENCOUNTER — Other Ambulatory Visit: Payer: Self-pay | Admitting: Neurological Surgery

## 2016-06-16 ENCOUNTER — Ambulatory Visit
Admission: RE | Admit: 2016-06-16 | Discharge: 2016-06-16 | Disposition: A | Discharge status: Home / Self Care | Payer: Medicare Other | Source: Ambulatory Visit | Attending: Neurological Surgery | Admitting: Neurological Surgery

## 2016-06-16 DIAGNOSIS — M5417 Radiculopathy, lumbosacral region: Secondary | ICD-10-CM

## 2016-06-16 MED ORDER — IOPAMIDOL (ISOVUE-M 200) INJECTION 41%
1.0000 mL | Freq: Once | INTRAMUSCULAR | Status: AC
  Administered 2016-06-16: 1 mL via EPIDURAL

## 2016-06-16 MED ORDER — METHYLPREDNISOLONE ACETATE 40 MG/ML INJ SUSP (RADIOLOG
120.0000 mg | Freq: Once | INTRAMUSCULAR | Status: AC
  Administered 2016-06-16: 120 mg via EPIDURAL

## 2016-06-16 NOTE — Discharge Instructions (Signed)

## 2016-08-31 ENCOUNTER — Other Ambulatory Visit: Payer: Self-pay | Admitting: Orthopedic Surgery

## 2016-09-01 ENCOUNTER — Ambulatory Visit: Payer: Self-pay | Admitting: Orthopedic Surgery

## 2016-09-08 ENCOUNTER — Other Ambulatory Visit (HOSPITAL_COMMUNITY): Payer: Self-pay | Admitting: Emergency Medicine

## 2016-09-08 NOTE — Patient Instructions (Addendum)
[name]  09/08/2016   Your procedure is scheduled on: 09-19-16  Report to [facility]  Entrance take [facility]  elevators to 3rd floor to  Short Stay Center at 830AM.  Call this number if you have problems the morning of surgery [phone]   Remember: ONLY 1 PERSON MAY GO WITH YOU TO SHORT STAY TO GET  READY MORNING OF YOUR SURGERY.  Do not eat food or drink liquids :After Midnight.     Take these medicines the morning of surgery with A SIP OF WATER: aricept,  sertraline(zoloft)               You may not have any metal on your body including hair pins and              piercings  Do not wear jewelry, make-up, lotions, powders or perfumes, deodorant             Do not wear nail polish.  Do not shave  48 hours prior to surgery.          Do not bring valuables to the hospital. [facility] IS NOT             RESPONSIBLE   FOR VALUABLES.  Contacts, dentures or bridgework may not be worn into surgery.  Leave suitcase in the car. After surgery it may be brought to your room.               Please read over the following fact sheets you were given: _____________________________________________________________________             [facility] - Preparing for Surgery Before surgery, you can play an important role.  Because skin is not sterile, your skin needs to be as free of germs as possible.  You can reduce the number of germs on your skin by washing with CHG (chlorahexidine gluconate) soap before surgery.  CHG is an antiseptic cleaner which kills germs and bonds with the skin to continue killing germs even after washing. Please DO NOT use if you have an allergy to CHG or antibacterial soaps.  If your skin becomes reddened/irritated stop using the CHG and inform your nurse when you arrive at Short Stay. Do not shave (including legs and underarms) for at least 48 hours prior to the first CHG shower.  You may shave your face/neck. Please follow these  instructions carefully:  1.  Shower with CHG Soap the night before surgery and the  morning of Surgery.  2.  If you choose to wash your hair, wash your hair first as usual with your  normal  shampoo.  3.  After you shampoo, rinse your hair and body thoroughly to remove the  shampoo.                           4.  Use CHG as you would any other liquid soap.  You can apply chg directly  to the skin and wash                       Gently with a scrungie or clean washcloth.  5.  Apply the CHG Soap to your body ONLY FROM THE NECK DOWN.   Do not use on face/ open  Wound or open sores. Avoid contact with eyes, ears mouth and genitals (private parts).                       Wash face,  Genitals (private parts) with your normal soap.             6.  Wash thoroughly, paying special attention to the area where your surgery  will be performed.  7.  Thoroughly rinse your body with warm water from the neck down.  8.  DO NOT shower/wash with your normal soap after using and rinsing off  the CHG Soap.                9.  Pat yourself dry with a clean towel.            10.  Wear clean pajamas.            11.  Place clean sheets on your bed the night of your first shower and do not  sleep with pets. Day of Surgery : Do not apply any lotions/deodorants the morning of surgery.  Please wear clean clothes to the hospital/surgery center.  FAILURE TO FOLLOW THESE INSTRUCTIONS MAY RESULT IN THE CANCELLATION OF YOUR SURGERY PATIENT SIGNATURE_________________________________  NURSE SIGNATURE__________________________________  ________________________________________________________________________   [name]  An incentive spirometer is a tool that can help keep your lungs clear and active. This tool measures how well you are filling your lungs with each breath. Taking long deep breaths may help reverse or decrease the chance of developing breathing (pulmonary) problems (especially  infection) following:  A long period of time when you are unable to move or be active. BEFORE THE PROCEDURE   If the spirometer includes an indicator to show your best effort, your nurse or respiratory therapist will set it to a desired goal.  If possible, sit up straight or lean slightly forward. Try not to slouch.  Hold the incentive spirometer in an upright position. INSTRUCTIONS FOR USE  1. Sit on the edge of your bed if possible, or sit up as far as you can in bed or on a chair. 2. Hold the incentive spirometer in an upright position. 3. Breathe out normally. 4. Place the mouthpiece in your mouth and seal your lips tightly around it. 5. Breathe in slowly and as deeply as possible, raising the piston or the ball toward the top of the column. 6. Hold your breath for 3-5 seconds or for as long as possible. Allow the piston or ball to fall to the bottom of the column. 7. Remove the mouthpiece from your mouth and breathe out normally. 8. Rest for a few seconds and repeat Steps 1 through 7 at least 10 times every 1-2 hours when you are awake. Take your time and take a few normal breaths between deep breaths. 9. The spirometer may include an indicator to show your best effort. Use the indicator as a goal to work toward during each repetition. 10. After each set of 10 deep breaths, practice coughing to be sure your lungs are clear. If you have an incision (the cut made at the time of surgery), support your incision when coughing by placing a pillow or rolled up towels firmly against it. Once you are able to get out of bed, walk around indoors and cough well. You may stop using the incentive spirometer when instructed by your caregiver.  RISKS AND COMPLICATIONS  Take your time so you do not get  dizzy or light-headed.  If you are in pain, you may need to take or ask for pain medication before doing incentive spirometry. It is harder to take a deep breath if you are having pain. AFTER  USE  Rest and breathe slowly and easily.  It can be helpful to keep track of a log of your progress. Your caregiver can provide you with a simple table to help with this. If you are using the spirometer at home, follow these instructions: [place] IF:   You are having difficultly using the spirometer.  You have trouble using the spirometer as often as instructed.  Your pain medication is not giving enough relief while using the spirometer.  You develop fever of 100.5 F (38.1 C) or higher. SEEK IMMEDIATE MEDICAL CARE IF:   You cough up bloody sputum that had not been present before.  You develop fever of 102 F (38.9 C) or greater.  You develop worsening pain at or near the incision site. MAKE SURE YOU:   Understand these instructions.  Will watch your condition.  Will get help right away if you are not doing well or get worse. Document Released: 10/03/2006 Document Revised: 08/15/2011 Document Reviewed: 12/04/2006 ExitCare Patient Information 2014 ExitCare, [place].   ________________________________________________________________________  WHAT IS A BLOOD TRANSFUSION? Blood Transfusion Information  A transfusion is the replacement of blood or some of its parts. Blood is made up of multiple cells which provide different functions.  Red blood cells carry oxygen and are used for blood loss replacement.  White blood cells fight against infection.  Platelets control bleeding.  Plasma helps clot blood.  Other blood products are available for specialized needs, such as hemophilia or other clotting disorders. BEFORE THE TRANSFUSION  Who gives blood for transfusions?   Healthy volunteers who are fully evaluated to make sure their blood is safe. This is blood bank blood. Transfusion therapy is the safest it has ever been in the practice of medicine. Before blood is taken from a donor, a complete history is taken to make sure that person has no history of diseases  nor engages in risky social behavior (examples are intravenous drug use or sexual activity with multiple partners). The donor's travel history is screened to minimize risk of transmitting infections, such as malaria. The donated blood is tested for signs of infectious diseases, such as HIV and hepatitis. The blood is then tested to be sure it is compatible with you in order to minimize the chance of a transfusion reaction. If you or a relative donates blood, this is often done in anticipation of surgery and is not appropriate for emergency situations. It takes many days to process the donated blood. RISKS AND COMPLICATIONS Although transfusion therapy is very safe and saves many lives, the main dangers of transfusion include:   Getting an infectious disease.  Developing a transfusion reaction. This is an allergic reaction to something in the blood you were given. Every precaution is taken to prevent this. The decision to have a blood transfusion has been considered carefully by your caregiver before blood is given. Blood is not given unless the benefits outweigh the risks. AFTER THE TRANSFUSION  Right after receiving a blood transfusion, you will usually feel much better and more energetic. This is especially true if your red blood cells have gotten low (anemic). The transfusion raises the level of the red blood cells which carry oxygen, and this usually causes an energy increase.  The nurse administering the transfusion will  monitor you carefully for complications. HOME CARE INSTRUCTIONS  No special instructions are needed after a transfusion. You may find your energy is better. Speak with your caregiver about any limitations on activity for underlying diseases you may have. SEEK MEDICAL CARE IF:   Your condition is not improving after your transfusion.  You develop redness or irritation at the intravenous (IV) site. SEEK IMMEDIATE MEDICAL CARE IF:  Any of the following symptoms occur over the  next 12 hours:  Shaking chills.  You have a temperature by mouth above 102 F (38.9 C), not controlled by medicine.  Chest, back, or muscle pain.  People around you feel you are not acting correctly or are confused.  Shortness of breath or difficulty breathing.  Dizziness and fainting.  You get a rash or develop hives.  You have a decrease in urine output.  Your urine turns a dark color or changes to pink, red, or brown. Any of the following symptoms occur over the next 10 days:  You have a temperature by mouth above 102 F (38.9 C), not controlled by medicine.  Shortness of breath.  Weakness after normal activity.  The white part of the eye turns yellow (jaundice).  You have a decrease in the amount of urine or are urinating less often.  Your urine turns a dark color or changes to pink, red, or brown. Document Released: 05/20/2000 Document Revised: 08/15/2011 Document Reviewed: 01/07/2008 [facility] Patient Information 2014 [place], [place].  _______________________________________________________________________

## 2016-09-08 NOTE — Progress Notes (Signed)
Surgical clearance Dr Jacky Kindle 07-12-16 on chart EKG 12-09-15 epic

## 2016-09-12 ENCOUNTER — Encounter (HOSPITAL_COMMUNITY)
Admission: RE | Admit: 2016-09-12 | Discharge: 2016-09-12 | Disposition: A | Discharge status: Home / Self Care | Payer: Medicare Other | Source: Ambulatory Visit | Attending: Orthopedic Surgery | Admitting: Orthopedic Surgery

## 2016-09-12 ENCOUNTER — Encounter (HOSPITAL_COMMUNITY): Payer: Self-pay

## 2016-09-12 DIAGNOSIS — Z01818 Encounter for other preprocedural examination: Secondary | ICD-10-CM | POA: Insufficient documentation

## 2016-09-12 DIAGNOSIS — M1712 Unilateral primary osteoarthritis, left knee: Secondary | ICD-10-CM | POA: Insufficient documentation

## 2016-09-12 LAB — COMPREHENSIVE METABOLIC PANEL
ALT: 12 U/L — ABNORMAL LOW (ref 14–54)
AST: 19 U/L (ref 15–41)
Albumin: 3.8 g/dL (ref 3.5–5.0)
Alkaline Phosphatase: 91 U/L (ref 38–126)
Anion gap: 7 (ref 5–15)
BUN: 20 mg/dL (ref 6–20)
CO2: 25 mmol/L (ref 22–32)
Calcium: 9 mg/dL (ref 8.9–10.3)
Chloride: 109 mmol/L (ref 101–111)
Creatinine, Ser: 0.65 mg/dL (ref 0.44–1.00)
GFR calc Af Amer: >60 mL/min (ref >60)
GFR calc non Af Amer: >60 mL/min (ref >60)
Glucose, Bld: 96 mg/dL (ref 65–99)
Potassium: 3.9 mmol/L (ref 3.5–5.1)
Sodium: 141 mmol/L (ref 135–145)
Total Bilirubin: 0.5 mg/dL (ref 0.3–1.2)
Total Protein: 6.9 g/dL (ref 6.5–8.1)

## 2016-09-12 LAB — CBC
HCT: 37.9 % (ref 36.0–46.0)
Hemoglobin: 12.3 g/dL (ref 12.0–15.0)
MCH: 29.6 pg (ref 26.0–34.0)
MCHC: 32.5 g/dL (ref 30.0–36.0)
MCV: 91.3 fL (ref 78.0–100.0)
Platelets: 247 10*3/uL (ref 150–400)
RBC: 4.15 MIL/uL (ref 3.87–5.11)
RDW: 13.9 % (ref 11.5–15.5)
WBC: 6 10*3/uL (ref 4.0–10.5)

## 2016-09-12 LAB — APTT: aPTT: 28 seconds (ref 24–36)

## 2016-09-12 LAB — PROTIME-INR
INR: 1
Prothrombin Time: 13.2 seconds (ref 11.4–15.2)

## 2016-09-12 LAB — SURGICAL PCR SCREEN
MRSA, PCR: NEGATIVE
Staphylococcus aureus: NEGATIVE

## 2016-09-15 ENCOUNTER — Ambulatory Visit: Payer: Self-pay | Admitting: Orthopedic Surgery

## 2016-09-15 NOTE — H&P (Signed)
[name] DOB: 02-16-1941 Single / Language: [place] / Race: White Female Date of Admission:  09/19/2016 CC: Left knee pain History of Present Illness The patient is a 76 year old female who comes in  for a preoperative History and Physical. The patient is scheduled for a left total knee arthroplasty to be performed by Dr. Gus Rankin. Aluisio, MD at [facility] on 09-19-2016. The patient is being followed for their left osteoarthritis. They are now month(s) out from [place] injection . Symptoms reported include: pain, swelling and giving way. The patient feels that they are doing poorly and report their pain level to be mild to moderate. Current treatment includes: NSAIDs. The following medication has been used for pain control: antiinflammatory medication (Mobic). The patient has reported improvement of their symptoms with: viscosupplementation. A big problem she is having now is that left knee. Unfortunately Gel-Syn injections did not provide any long-term benefit. She is getting progressively worsening problems with the knee. It is limiting what she can and cannot do. Occasionally it wants to give out on her. She starting to progress into more valgus also. She is ready to get the knee fixed at this time. They have been treated conservatively in the past for the above stated problem and despite conservative measures, they continue to have progressive pain and severe functional limitations and dysfunction. They have failed non-operative management including home exercise, medications, and injections. It is felt that they would benefit from undergoing total joint replacement. Risks and benefits of the procedure have been discussed with the patient and they elect to proceed with surgery. There are no active contraindications to surgery such as ongoing infection or rapidly progressive neurological disease.  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)  Trigger middle finger of right hand (M65.331)  Pain of left hip joint (M25.552)  Primary osteoarthritis of left knee (M17.12)  Primary osteoarthritis of left hip (M16.12)  Hand pain, right (M79.641)  High blood pressure  Irritable bowel syndrome  Osteoarthritis   Allergies  No Known Drug Allergies  Family History  Diabetes Mellitus  father Cancer  father Cerebrovascular Accident  mother and grandmother mothers side Hypertension  mother, father, brother, grandmother mothers side and grandfather mothers side Rheumatoid Arthritis  mother, grandmother mothers side and grandfather mothers side  Social History  Tobacco use  Never smoker. never smoker Alcohol use  never consumed alcohol Pain Contract  no Children  0 Current work status  retired Public house manager, [employer] Drug/Alcohol Rehab (Currently)  no Drug/Alcohol Rehab (Previously)  no Exercise  Exercises weekly; does other Illicit drug use  no Living situation  live alone Marital status  single Tobacco / smoke exposure  no Advance Directives  Living Will, Healthcare POA  Medication History Pravastatin Sodium (Oral) Specific strength unknown - Active. Donepezil HCl (  Tablet, Oral) Active. Progesterone (  Capsule, Oral) Active. Estradiol (0.5MG  Tablet, Oral) Active. Enalapril Maleate (Oral) Specific strength unknown - Active. Zoloft (Oral) Specific strength unknown - Active. Meloxicam (Oral) Specific strength unknown - Active. Vitamin D3 (2000UNIT Tablet, Oral) Active. Hydrocodone-Acetaminophen (Oral as needed) Specific strength unknown - Active. Aspirin (  Tablet Chewable, Oral) Active.  Past Surgical History Arthroscopy of Knee  left Breast Biopsy  right, multiple times Spinal Fusion  lower back Spinal Decompression  lower back Total Knee Replacement  right Spinal Surgery  Tonsillectomy   Review  of Systems General Not Present- Chills, Fatigue, Fever, Memory  Loss, Night Sweats, Weight Gain and Weight Loss. Skin Not Present- Eczema, Hives, Itching, Lesions and Rash. HEENT Not Present- Dentures, Double Vision, Headache, Hearing Loss, Tinnitus and Visual Loss. Respiratory Not Present- Allergies, Chronic Cough, Coughing up blood, Shortness of breath at rest and Shortness of breath with exertion. Cardiovascular Not Present- Chest Pain, Difficulty Breathing Lying Down, Murmur, Palpitations, Racing/skipping heartbeats and Swelling. Gastrointestinal Not Present- Abdominal Pain, Bloody Stool, Constipation, Diarrhea, Difficulty Swallowing, Heartburn, Jaundice, Loss of appetitie, Nausea and Vomiting. Female Genitourinary Not Present- Blood in Urine, Discharge, Flank Pain, Incontinence, Painful Urination, Urgency, Urinary frequency, Urinary Retention, Urinating at Night and Weak urinary stream. Musculoskeletal Present- Back Pain and Joint Pain. Not Present- Joint Swelling, Morning Stiffness, Muscle Pain, Muscle Weakness and Spasms. Neurological Not Present- Blackout spells, Difficulty with balance, Dizziness, Paralysis, Tremor and Weakness. Psychiatric Not Present- Insomnia.  Vitals  Weight: 175 lb Height: 67in Weight was reported by patient. Height was reported by patient. Body Surface Area: 1.91 m Body Mass Index: 27.41 kg/m  Pulse: 68 (Regular)  BP: 152/86 (Sitting, Right Arm, Standard)  Physical Exam  General Mental Status -Alert, cooperative and good historian. General Appearance-pleasant, Not in acute distress. Orientation-Oriented X3. Build & Nutrition-Well nourished and Well developed.  Head and Neck Head-normocephalic, atraumatic . Neck Global Assessment - supple, no bruit auscultated on the right, no bruit auscultated on the left.  Eye Pupil - Bilateral-Regular and Round. Motion - Bilateral-EOMI.  Chest and Lung Exam Auscultation Breath sounds -  clear at anterior chest wall and clear at posterior chest wall. Adventitious sounds - No Adventitious sounds.  Cardiovascular Auscultation Rhythm - Regular rate and rhythm. Heart Sounds - S1 WNL and S2 WNL. Murmurs & Other Heart Sounds - Auscultation of the heart reveals - No Murmurs.  Abdomen Palpation/Percussion Tenderness - Abdomen is non-tender to palpation. Rigidity (guarding) - Abdomen is soft. Auscultation Auscultation of the abdomen reveals - Bowel sounds normal.  Female Genitourinary Note: Not done, not pertinent to present illness   Musculoskeletal Note: On exam, she is in no distress. Her left knee shows no effusion. There was valgus deformity present. Her range of motion is about 0 to 125. She is tender lateral greater than medial. There is no instability noted. Pulse, sensation and motor intact. Left hip flexion about 100, to about 20 internal rotation, 30 external rotation, 30 abduction.  She had x-rays today. AP pelvis, lateral of the left hip showing that there is no progression in the degenerative changes of the hip. She still has some joint space left. AP both knees and lateral of the left show that she has bone-on-bone arthritis in the lateral and patellofemoral compartments of the left knee with valgus deformity.   Assessment & Plan  Primary osteoarthritis of left knee (M17.12)  Note:Surgical Plans: Left Total Knee Replacement  Disposition: Home  PCP: Dr. Aronson - Patient has been seen preoperatively and felt to be stable for surgery.  IV TXA  Anesthesia Issues: None  Patient was instructed on what medications to stop prior to surgery.  Signed electronically by Quinta Eimer L Ceana Fiala, III PA-C   

## 2016-09-19 ENCOUNTER — Encounter (HOSPITAL_COMMUNITY): Payer: Self-pay | Admitting: *Deleted

## 2016-09-19 ENCOUNTER — Inpatient Hospital Stay (HOSPITAL_COMMUNITY): Payer: Medicare Other | Admitting: Certified Registered"

## 2016-09-19 ENCOUNTER — Encounter (HOSPITAL_COMMUNITY)
Admission: RE | Disposition: A | Discharge status: Home Health | Payer: Self-pay | Source: Ambulatory Visit | Attending: Orthopedic Surgery

## 2016-09-19 ENCOUNTER — Inpatient Hospital Stay (HOSPITAL_COMMUNITY)
Admission: RE | Admit: 2016-09-19 | Discharge: 2016-09-22 | DRG: 470 | Disposition: A | Discharge status: Home Health | Payer: Medicare Other | Source: Ambulatory Visit | Attending: Orthopedic Surgery | Admitting: Orthopedic Surgery

## 2016-09-19 DIAGNOSIS — I10 Essential (primary) hypertension: Secondary | ICD-10-CM | POA: Diagnosis present

## 2016-09-19 DIAGNOSIS — M1712 Unilateral primary osteoarthritis, left knee: Secondary | ICD-10-CM | POA: Diagnosis present

## 2016-09-19 DIAGNOSIS — K589 Irritable bowel syndrome without diarrhea: Secondary | ICD-10-CM | POA: Diagnosis present

## 2016-09-19 DIAGNOSIS — Z7982 Long term (current) use of aspirin: Secondary | ICD-10-CM

## 2016-09-19 DIAGNOSIS — Z8249 Family history of ischemic heart disease and other diseases of the circulatory system: Secondary | ICD-10-CM | POA: Diagnosis not present

## 2016-09-19 DIAGNOSIS — Z79899 Other long term (current) drug therapy: Secondary | ICD-10-CM

## 2016-09-19 DIAGNOSIS — Z96651 Presence of right artificial knee joint: Secondary | ICD-10-CM | POA: Diagnosis present

## 2016-09-19 DIAGNOSIS — M1612 Unilateral primary osteoarthritis, left hip: Secondary | ICD-10-CM | POA: Diagnosis present

## 2016-09-19 DIAGNOSIS — E7800 Pure hypercholesterolemia, unspecified: Secondary | ICD-10-CM | POA: Diagnosis present

## 2016-09-19 DIAGNOSIS — Z981 Arthrodesis status: Secondary | ICD-10-CM | POA: Diagnosis not present

## 2016-09-19 DIAGNOSIS — M1710 Unilateral primary osteoarthritis, unspecified knee: Secondary | ICD-10-CM | POA: Diagnosis present

## 2016-09-19 DIAGNOSIS — M21062 Valgus deformity, not elsewhere classified, left knee: Secondary | ICD-10-CM | POA: Diagnosis present

## 2016-09-19 DIAGNOSIS — M25762 Osteophyte, left knee: Secondary | ICD-10-CM | POA: Diagnosis present

## 2016-09-19 DIAGNOSIS — M25562 Pain in left knee: Secondary | ICD-10-CM | POA: Diagnosis present

## 2016-09-19 DIAGNOSIS — F419 Anxiety disorder, unspecified: Secondary | ICD-10-CM | POA: Diagnosis present

## 2016-09-19 DIAGNOSIS — M179 Osteoarthritis of knee, unspecified: Secondary | ICD-10-CM

## 2016-09-19 HISTORY — PX: TOTAL KNEE ARTHROPLASTY: SHX125

## 2016-09-19 LAB — TYPE AND SCREEN
ABO/RH(D): O NEG
Antibody Screen: NEGATIVE

## 2016-09-19 SURGERY — ARTHROPLASTY, KNEE, TOTAL
Anesthesia: General | Site: Knee | Laterality: Left

## 2016-09-19 MED ORDER — GABAPENTIN 300 MG PO CAPS
300.0000 mg | ORAL_CAPSULE | Freq: Once | ORAL | Status: AC
  Administered 2016-09-19: 300 mg via ORAL

## 2016-09-19 MED ORDER — GABAPENTIN 300 MG PO CAPS
ORAL_CAPSULE | ORAL | Status: AC
  Administered 2016-09-19: 300 mg via ORAL
  Filled 2016-09-19: qty 1

## 2016-09-19 MED ORDER — FENTANYL CITRATE (PF) 100 MCG/2ML IJ SOLN
INTRAMUSCULAR | Status: AC
  Administered 2016-09-19: 50 ug via INTRAVENOUS
  Filled 2016-09-19: qty 2

## 2016-09-19 MED ORDER — STERILE WATER FOR IRRIGATION IR SOLN
Status: DC | PRN
  Administered 2016-09-19: 2000 mL

## 2016-09-19 MED ORDER — ACETAMINOPHEN 500 MG PO TABS
1000.0000 mg | ORAL_TABLET | Freq: Four times a day (QID) | ORAL | Status: AC
  Administered 2016-09-19 – 2016-09-20 (×4): 1000 mg via ORAL
  Filled 2016-09-19 (×4): qty 2

## 2016-09-19 MED ORDER — SODIUM CHLORIDE 0.9 % IJ SOLN
INTRAMUSCULAR | Status: AC
  Filled 2016-09-19: qty 50

## 2016-09-19 MED ORDER — LACTATED RINGERS IV SOLN
INTRAVENOUS | Status: DC
  Administered 2016-09-19: 13:00:00 via INTRAVENOUS

## 2016-09-19 MED ORDER — HYDROMORPHONE HCL 2 MG PO TABS
2.0000 mg | ORAL_TABLET | ORAL | Status: DC | PRN
  Administered 2016-09-19 – 2016-09-20 (×4): 2 mg via ORAL
  Administered 2016-09-20 – 2016-09-22 (×6): 4 mg via ORAL
  Administered 2016-09-22: 2 mg via ORAL
  Filled 2016-09-19 (×2): qty 2
  Filled 2016-09-19 (×3): qty 1
  Filled 2016-09-19 (×4): qty 2
  Filled 2016-09-19 (×2): qty 1

## 2016-09-19 MED ORDER — ONDANSETRON HCL 4 MG/2ML IJ SOLN
4.0000 mg | Freq: Four times a day (QID) | INTRAMUSCULAR | Status: DC | PRN
  Administered 2016-09-19: 4 mg via INTRAVENOUS
  Filled 2016-09-19: qty 2

## 2016-09-19 MED ORDER — BUPIVACAINE LIPOSOME 1.3 % IJ SUSP
INTRAMUSCULAR | Status: DC | PRN
  Administered 2016-09-19: 20 mL

## 2016-09-19 MED ORDER — ONDANSETRON HCL 4 MG/2ML IJ SOLN
INTRAMUSCULAR | Status: AC
  Filled 2016-09-19: qty 2

## 2016-09-19 MED ORDER — MENTHOL 3 MG MT LOZG: 1.0000 | LOZENGE | OROMUCOSAL | Status: DC | PRN

## 2016-09-19 MED ORDER — TRANEXAMIC ACID 1000 MG/10ML IV SOLN
1000.0000 mg | INTRAVENOUS | Status: AC
  Administered 2016-09-19: 1000 mg via INTRAVENOUS
  Filled 2016-09-19: qty 1100

## 2016-09-19 MED ORDER — LIP MEDEX EX OINT
TOPICAL_OINTMENT | CUTANEOUS | Status: AC
  Filled 2016-09-19: qty 7

## 2016-09-19 MED ORDER — CEFAZOLIN SODIUM-DEXTROSE 2-4 GM/100ML-% IV SOLN
2.0000 g | INTRAVENOUS | Status: AC
  Administered 2016-09-19: 2 g via INTRAVENOUS

## 2016-09-19 MED ORDER — HYDROMORPHONE HCL 2 MG/ML IJ SOLN
INTRAMUSCULAR | Status: AC
  Filled 2016-09-19: qty 1

## 2016-09-19 MED ORDER — POLYETHYLENE GLYCOL 3350 17 G PO PACK: 17.0000 g | PACK | Freq: Every day | ORAL | Status: DC | PRN

## 2016-09-19 MED ORDER — PROMETHAZINE HCL 25 MG/ML IJ SOLN: 6.2500 mg | INTRAMUSCULAR | Status: DC | PRN

## 2016-09-19 MED ORDER — SODIUM CHLORIDE 0.9 % IJ SOLN
INTRAMUSCULAR | Status: DC | PRN
  Administered 2016-09-19: 30 mL

## 2016-09-19 MED ORDER — METHOCARBAMOL 500 MG PO TABS
500.0000 mg | ORAL_TABLET | Freq: Four times a day (QID) | ORAL | Status: DC | PRN
  Administered 2016-09-19 – 2016-09-22 (×5): 500 mg via ORAL
  Filled 2016-09-19 (×5): qty 1

## 2016-09-19 MED ORDER — FLEET ENEMA 7-19 GM/118ML RE ENEM: 1.0000 | ENEMA | Freq: Once | RECTAL | Status: DC | PRN

## 2016-09-19 MED ORDER — FENTANYL CITRATE (PF) 100 MCG/2ML IJ SOLN
INTRAMUSCULAR | Status: AC
  Filled 2016-09-19: qty 2

## 2016-09-19 MED ORDER — DEXAMETHASONE SODIUM PHOSPHATE 10 MG/ML IJ SOLN
10.0000 mg | Freq: Once | INTRAMUSCULAR | Status: AC
  Administered 2016-09-20: 10 mg via INTRAVENOUS
  Filled 2016-09-19: qty 1

## 2016-09-19 MED ORDER — ONDANSETRON HCL 4 MG/2ML IJ SOLN
INTRAMUSCULAR | Status: DC | PRN
  Administered 2016-09-19: 4 mg via INTRAVENOUS

## 2016-09-19 MED ORDER — METOCLOPRAMIDE HCL 5 MG PO TABS: 5.0000 mg | ORAL_TABLET | Freq: Three times a day (TID) | ORAL | Status: DC | PRN

## 2016-09-19 MED ORDER — ACETAMINOPHEN 650 MG RE SUPP: 650.0000 mg | Freq: Four times a day (QID) | RECTAL | Status: DC | PRN

## 2016-09-19 MED ORDER — FENTANYL CITRATE (PF) 100 MCG/2ML IJ SOLN
INTRAMUSCULAR | Status: DC | PRN
  Administered 2016-09-19 (×4): 50 ug via INTRAVENOUS

## 2016-09-19 MED ORDER — MIDAZOLAM HCL 2 MG/2ML IJ SOLN
INTRAMUSCULAR | Status: DC | PRN
  Administered 2016-09-19 (×2): 1 mg via INTRAVENOUS

## 2016-09-19 MED ORDER — SUCCINYLCHOLINE CHLORIDE 20 MG/ML IJ SOLN
INTRAMUSCULAR | Status: DC | PRN
  Administered 2016-09-19: 60 mg via INTRAVENOUS

## 2016-09-19 MED ORDER — CHLORHEXIDINE GLUCONATE 4 % EX LIQD: 60.0000 mL | Freq: Once | CUTANEOUS | Status: DC

## 2016-09-19 MED ORDER — ACETAMINOPHEN 10 MG/ML IV SOLN
INTRAVENOUS | Status: AC
  Filled 2016-09-19: qty 100

## 2016-09-19 MED ORDER — MIDAZOLAM HCL 2 MG/2ML IJ SOLN
INTRAMUSCULAR | Status: AC
  Filled 2016-09-19: qty 2

## 2016-09-19 MED ORDER — 0.9 % SODIUM CHLORIDE (POUR BTL) OPTIME
TOPICAL | Status: DC | PRN
  Administered 2016-09-19: 1000 mL

## 2016-09-19 MED ORDER — LABETALOL HCL 5 MG/ML IV SOLN
INTRAVENOUS | Status: DC | PRN
  Administered 2016-09-19 (×4): 5 mg via INTRAVENOUS

## 2016-09-19 MED ORDER — ACETAMINOPHEN 10 MG/ML IV SOLN
1000.0000 mg | Freq: Once | INTRAVENOUS | Status: AC
  Administered 2016-09-19: 1000 mg via INTRAVENOUS

## 2016-09-19 MED ORDER — DIPHENHYDRAMINE HCL 12.5 MG/5ML PO ELIX: 12.5000 mg | ORAL_SOLUTION | ORAL | Status: DC | PRN

## 2016-09-19 MED ORDER — DEXAMETHASONE SODIUM PHOSPHATE 10 MG/ML IJ SOLN
10.0000 mg | Freq: Once | INTRAMUSCULAR | Status: AC
  Administered 2016-09-19: 10 mg via INTRAVENOUS

## 2016-09-19 MED ORDER — LACTATED RINGERS IV SOLN
INTRAVENOUS | Status: DC
  Administered 2016-09-19 (×2): via INTRAVENOUS

## 2016-09-19 MED ORDER — MEPERIDINE HCL 50 MG/ML IJ SOLN: 6.2500 mg | INTRAMUSCULAR | Status: DC | PRN

## 2016-09-19 MED ORDER — PRAVASTATIN SODIUM 20 MG PO TABS
20.0000 mg | ORAL_TABLET | Freq: Every day | ORAL | Status: DC
  Administered 2016-09-20 – 2016-09-22 (×3): 20 mg via ORAL
  Filled 2016-09-19 (×3): qty 1

## 2016-09-19 MED ORDER — DEXTROSE 5 % IV SOLN
500.0000 mg | Freq: Four times a day (QID) | INTRAVENOUS | Status: DC | PRN
  Administered 2016-09-19: 500 mg via INTRAVENOUS
  Filled 2016-09-19: qty 5
  Filled 2016-09-19: qty 550

## 2016-09-19 MED ORDER — SODIUM CHLORIDE 0.9 % IV SOLN
INTRAVENOUS | Status: DC
  Administered 2016-09-20: 03:00:00 via INTRAVENOUS

## 2016-09-19 MED ORDER — MORPHINE SULFATE (PF) 2 MG/ML IV SOLN
1.0000 mg | INTRAVENOUS | Status: DC | PRN
  Administered 2016-09-19 (×3): 1 mg via INTRAVENOUS
  Filled 2016-09-19 (×3): qty 1

## 2016-09-19 MED ORDER — BUPIVACAINE LIPOSOME 1.3 % IJ SUSP
20.0000 mL | Freq: Once | INTRAMUSCULAR | Status: DC
  Filled 2016-09-19: qty 20

## 2016-09-19 MED ORDER — METOCLOPRAMIDE HCL 5 MG/ML IJ SOLN: 5.0000 mg | Freq: Three times a day (TID) | INTRAMUSCULAR | Status: DC | PRN

## 2016-09-19 MED ORDER — MIDAZOLAM HCL 2 MG/2ML IJ SOLN
1.0000 mg | INTRAMUSCULAR | Status: DC | PRN
  Administered 2016-09-19: 1 mg via INTRAVENOUS

## 2016-09-19 MED ORDER — PHENYLEPHRINE 40 MCG/ML (10ML) SYRINGE FOR IV PUSH (FOR BLOOD PRESSURE SUPPORT)
PREFILLED_SYRINGE | INTRAVENOUS | Status: AC
  Filled 2016-09-19: qty 10

## 2016-09-19 MED ORDER — FENTANYL CITRATE (PF) 100 MCG/2ML IJ SOLN
50.0000 ug | INTRAMUSCULAR | Status: DC | PRN
  Administered 2016-09-19: 50 ug via INTRAVENOUS

## 2016-09-19 MED ORDER — PROPOFOL 10 MG/ML IV BOLUS
INTRAVENOUS | Status: DC | PRN
  Administered 2016-09-19: 150 mg via INTRAVENOUS
  Administered 2016-09-19: 50 mg via INTRAVENOUS

## 2016-09-19 MED ORDER — HYDROMORPHONE HCL 2 MG/ML IJ SOLN
0.3000 mg | INTRAMUSCULAR | Status: DC | PRN
  Administered 2016-09-19 (×4): 0.5 mg via INTRAVENOUS

## 2016-09-19 MED ORDER — DOCUSATE SODIUM 100 MG PO CAPS
100.0000 mg | ORAL_CAPSULE | Freq: Two times a day (BID) | ORAL | Status: DC
  Administered 2016-09-19 – 2016-09-22 (×5): 100 mg via ORAL
  Filled 2016-09-19 (×6): qty 1

## 2016-09-19 MED ORDER — CEFAZOLIN SODIUM-DEXTROSE 2-4 GM/100ML-% IV SOLN
2.0000 g | Freq: Four times a day (QID) | INTRAVENOUS | Status: AC
  Administered 2016-09-19 (×2): 2 g via INTRAVENOUS
  Filled 2016-09-19 (×2): qty 100

## 2016-09-19 MED ORDER — SERTRALINE HCL 50 MG PO TABS
50.0000 mg | ORAL_TABLET | Freq: Every day | ORAL | Status: DC
  Administered 2016-09-20 – 2016-09-22 (×3): 50 mg via ORAL
  Filled 2016-09-19 (×3): qty 1

## 2016-09-19 MED ORDER — MIDAZOLAM HCL 2 MG/2ML IJ SOLN
INTRAMUSCULAR | Status: AC
  Administered 2016-09-19: 1 mg via INTRAVENOUS
  Filled 2016-09-19: qty 2

## 2016-09-19 MED ORDER — PROPOFOL 10 MG/ML IV BOLUS
INTRAVENOUS | Status: AC
  Filled 2016-09-19: qty 80

## 2016-09-19 MED ORDER — RIVAROXABAN 10 MG PO TABS
10.0000 mg | ORAL_TABLET | Freq: Every day | ORAL | Status: DC
  Administered 2016-09-20 – 2016-09-22 (×3): 10 mg via ORAL
  Filled 2016-09-19 (×3): qty 1

## 2016-09-19 MED ORDER — PHENOL 1.4 % MT LIQD
1.0000 | OROMUCOSAL | Status: DC | PRN
  Filled 2016-09-19: qty 177

## 2016-09-19 MED ORDER — ACETAMINOPHEN 325 MG PO TABS
650.0000 mg | ORAL_TABLET | Freq: Four times a day (QID) | ORAL | Status: DC | PRN
  Filled 2016-09-19: qty 2

## 2016-09-19 MED ORDER — DEXAMETHASONE SODIUM PHOSPHATE 10 MG/ML IJ SOLN
INTRAMUSCULAR | Status: AC
  Filled 2016-09-19: qty 1

## 2016-09-19 MED ORDER — BISACODYL 10 MG RE SUPP: 10.0000 mg | Freq: Every day | RECTAL | Status: DC | PRN

## 2016-09-19 MED ORDER — ROPIVACAINE HCL 5 MG/ML IJ SOLN
INTRAMUSCULAR | Status: DC | PRN
  Administered 2016-09-19: 25 mL via PERINEURAL

## 2016-09-19 MED ORDER — CEFAZOLIN SODIUM-DEXTROSE 2-4 GM/100ML-% IV SOLN
INTRAVENOUS | Status: AC
  Filled 2016-09-19: qty 100

## 2016-09-19 MED ORDER — DONEPEZIL HCL 10 MG PO TABS
5.0000 mg | ORAL_TABLET | Freq: Every day | ORAL | Status: DC
  Administered 2016-09-20 – 2016-09-22 (×3): 5 mg via ORAL
  Filled 2016-09-19 (×3): qty 1

## 2016-09-19 MED ORDER — ONDANSETRON HCL 4 MG PO TABS
4.0000 mg | ORAL_TABLET | Freq: Four times a day (QID) | ORAL | Status: DC | PRN
  Administered 2016-09-20: 4 mg via ORAL
  Filled 2016-09-19: qty 1

## 2016-09-19 SURGICAL SUPPLY — 50 items
BAG DECANTER FOR FLEXI CONT (MISCELLANEOUS) IMPLANT
BAG ZIPLOCK 12X15 (MISCELLANEOUS) ×2 IMPLANT
BANDAGE ACE 6X5 VEL STRL LF (GAUZE/BANDAGES/DRESSINGS) ×2 IMPLANT
BLADE SAG 18X100X1.27 (BLADE) ×2 IMPLANT
BLADE SAW SGTL 11.0X1.19X90.0M (BLADE) ×2 IMPLANT
BOWL SMART MIX CTS (DISPOSABLE) ×2 IMPLANT
CAP KNEE TOTAL 3 SIGMA ×2 IMPLANT
CEMENT HV SMART SET (Cement) ×4 IMPLANT
COVER SURGICAL LIGHT HANDLE (MISCELLANEOUS) ×2 IMPLANT
CUFF TOURN SGL QUICK 34 (TOURNIQUET CUFF) ×1
CUFF TRNQT CYL 34X4X40X1 (TOURNIQUET CUFF) ×1 IMPLANT
DECANTER SPIKE VIAL GLASS SM (MISCELLANEOUS) ×2 IMPLANT
DRAPE U-SHAPE 47X51 STRL (DRAPES) ×2 IMPLANT
DRSG ADAPTIC 3X8 NADH LF (GAUZE/BANDAGES/DRESSINGS) ×2 IMPLANT
DRSG PAD ABDOMINAL 8X10 ST (GAUZE/BANDAGES/DRESSINGS) ×2 IMPLANT
DURAPREP 26ML APPLICATOR (WOUND CARE) ×2 IMPLANT
ELECT REM PT RETURN 15FT ADLT (MISCELLANEOUS) ×2 IMPLANT
EVACUATOR 1/8 PVC DRAIN (DRAIN) ×2 IMPLANT
GAUZE SPONGE 4X4 12PLY STRL (GAUZE/BANDAGES/DRESSINGS) ×2 IMPLANT
GLOVE BIO SURGEON STRL SZ7.5 (GLOVE) ×2 IMPLANT
GLOVE BIO SURGEON STRL SZ8 (GLOVE) ×2 IMPLANT
GLOVE BIOGEL PI IND STRL 6.5 (GLOVE) IMPLANT
GLOVE BIOGEL PI IND STRL 8 (GLOVE) ×2 IMPLANT
GLOVE BIOGEL PI INDICATOR 6.5 (GLOVE)
GLOVE BIOGEL PI INDICATOR 8 (GLOVE) ×2
GLOVE SURG SS PI 6.5 STRL IVOR (GLOVE) IMPLANT
GOWN STRL REUS W/TWL LRG LVL3 (GOWN DISPOSABLE) ×2 IMPLANT
GOWN STRL REUS W/TWL XL LVL3 (GOWN DISPOSABLE) ×2 IMPLANT
HANDPIECE INTERPULSE COAX TIP (DISPOSABLE) ×1
IMMOBILIZER KNEE 20 (SOFTGOODS) ×2
IMMOBILIZER KNEE 20 THIGH 36 (SOFTGOODS) ×1 IMPLANT
MANIFOLD NEPTUNE II (INSTRUMENTS) ×2 IMPLANT
NS IRRIG 1000ML POUR BTL (IV SOLUTION) ×2 IMPLANT
PACK TOTAL KNEE CUSTOM (KITS) ×2 IMPLANT
PADDING CAST COTTON 6X4 STRL (CAST SUPPLIES) ×4 IMPLANT
POSITIONER SURGICAL ARM (MISCELLANEOUS) ×2 IMPLANT
SET HNDPC FAN SPRY TIP SCT (DISPOSABLE) ×1 IMPLANT
STRIP CLOSURE SKIN 1/2X4 (GAUZE/BANDAGES/DRESSINGS) ×4 IMPLANT
SUT MNCRL AB 4-0 PS2 18 (SUTURE) ×2 IMPLANT
SUT STRATAFIX 0 PDS 27 VIOLET (SUTURE) ×2
SUT VIC AB 2-0 CT1 27 (SUTURE) ×3
SUT VIC AB 2-0 CT1 TAPERPNT 27 (SUTURE) ×3 IMPLANT
SUTURE STRATFX 0 PDS 27 VIOLET (SUTURE) ×1 IMPLANT
SYR 50ML LL SCALE MARK (SYRINGE) IMPLANT
TRAY FOLEY BAG SILVER LF 16FR (CATHETERS) IMPLANT
TRAY FOLEY CATH 14FRSI W/METER (CATHETERS) ×2 IMPLANT
TRAY FOLEY W/METER SILVER 16FR (SET/KITS/TRAYS/PACK) IMPLANT
WATER STERILE IRR 1000ML POUR (IV SOLUTION) ×4 IMPLANT
WRAP KNEE MAXI GEL POST OP (GAUZE/BANDAGES/DRESSINGS) ×2 IMPLANT
YANKAUER SUCT BULB TIP 10FT TU (MISCELLANEOUS) ×2 IMPLANT

## 2016-09-19 NOTE — Anesthesia Procedure Notes (Signed)
Procedure Name: Intubation Date/Time: 09/19/2016 10:50 AM Performed by: Pilar Grammes Pre-anesthesia Checklist: Patient identified, Emergency Drugs available, Suction available, Patient being monitored and Timeout performed Patient Re-evaluated:Patient Re-evaluated prior to inductionOxygen Delivery Method: Circle system utilized Preoxygenation: Pre-oxygenation with 100% oxygen Intubation Type: IV induction Ventilation: Mask ventilation without difficulty Laryngoscope Size: 3 and Mac Grade View: Grade I Tube type: Oral Tube size: 7.0 mm Number of attempts: 1 Airway Equipment and Method: Stylet Placement Confirmation: positive ETCO2,  ETT inserted through vocal cords under direct vision,  CO2 detector and breath sounds checked- equal and bilateral Secured at: 21 cm Tube secured with: Tape Dental Injury: Teeth and Oropharynx as per pre-operative assessment

## 2016-09-19 NOTE — Addendum Note (Signed)
Addendum  created 09/19/16 1404 by Donna Bernard, CRNA   Anesthesia Event edited, Anesthesia Intra Flowsheets edited

## 2016-09-19 NOTE — H&P (View-Only) (Signed)
[name] DOB: 02-16-1941 Single / Language: [place] / Race: White Female Date of Admission:  09/19/2016 CC: Left knee pain History of Present Illness The patient is a 76 year old female who comes in  for a preoperative History and Physical. The patient is scheduled for a left total knee arthroplasty to be performed by Dr. Gus Rankin. Aluisio, MD at [facility] on 09-19-2016. The patient is being followed for their left osteoarthritis. They are now month(s) out from [place] injection . Symptoms reported include: pain, swelling and giving way. The patient feels that they are doing poorly and report their pain level to be mild to moderate. Current treatment includes: NSAIDs. The following medication has been used for pain control: antiinflammatory medication (Mobic). The patient has reported improvement of their symptoms with: viscosupplementation. A big problem she is having now is that left knee. Unfortunately Gel-Syn injections did not provide any long-term benefit. She is getting progressively worsening problems with the knee. It is limiting what she can and cannot do. Occasionally it wants to give out on her. She starting to progress into more valgus also. She is ready to get the knee fixed at this time. They have been treated conservatively in the past for the above stated problem and despite conservative measures, they continue to have progressive pain and severe functional limitations and dysfunction. They have failed non-operative management including home exercise, medications, and injections. It is felt that they would benefit from undergoing total joint replacement. Risks and benefits of the procedure have been discussed with the patient and they elect to proceed with surgery. There are no active contraindications to surgery such as ongoing infection or rapidly progressive neurological disease.  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)  Trigger middle finger of right hand (M65.331)  Pain of left hip joint (M25.552)  Primary osteoarthritis of left knee (M17.12)  Primary osteoarthritis of left hip (M16.12)  Hand pain, right (M79.641)  High blood pressure  Irritable bowel syndrome  Osteoarthritis   Allergies  No Known Drug Allergies  Family History  Diabetes Mellitus  father Cancer  father Cerebrovascular Accident  mother and grandmother mothers side Hypertension  mother, father, brother, grandmother mothers side and grandfather mothers side Rheumatoid Arthritis  mother, grandmother mothers side and grandfather mothers side  Social History  Tobacco use  Never smoker. never smoker Alcohol use  never consumed alcohol Pain Contract  no Children  0 Current work status  retired Public house manager, [employer] Drug/Alcohol Rehab (Currently)  no Drug/Alcohol Rehab (Previously)  no Exercise  Exercises weekly; does other Illicit drug use  no Living situation  live alone Marital status  single Tobacco / smoke exposure  no Advance Directives  Living Will, Healthcare POA  Medication History Pravastatin Sodium (Oral) Specific strength unknown - Active. Donepezil HCl (  Tablet, Oral) Active. Progesterone (  Capsule, Oral) Active. Estradiol (0.5MG  Tablet, Oral) Active. Enalapril Maleate (Oral) Specific strength unknown - Active. Zoloft (Oral) Specific strength unknown - Active. Meloxicam (Oral) Specific strength unknown - Active. Vitamin D3 (2000UNIT Tablet, Oral) Active. Hydrocodone-Acetaminophen (Oral as needed) Specific strength unknown - Active. Aspirin (  Tablet Chewable, Oral) Active.  Past Surgical History Arthroscopy of Knee  left Breast Biopsy  right, multiple times Spinal Fusion  lower back Spinal Decompression  lower back Total Knee Replacement  right Spinal Surgery  Tonsillectomy   Review  of Systems General Not Present- Chills, Fatigue, Fever, Memory  Loss, Night Sweats, Weight Gain and Weight Loss. Skin Not Present- Eczema, Hives, Itching, Lesions and Rash. HEENT Not Present- Dentures, Double Vision, Headache, Hearing Loss, Tinnitus and Visual Loss. Respiratory Not Present- Allergies, Chronic Cough, Coughing up blood, Shortness of breath at rest and Shortness of breath with exertion. Cardiovascular Not Present- Chest Pain, Difficulty Breathing Lying Down, Murmur, Palpitations, Racing/skipping heartbeats and Swelling. Gastrointestinal Not Present- Abdominal Pain, Bloody Stool, Constipation, Diarrhea, Difficulty Swallowing, Heartburn, Jaundice, Loss of appetitie, Nausea and Vomiting. Female Genitourinary Not Present- Blood in Urine, Discharge, Flank Pain, Incontinence, Painful Urination, Urgency, Urinary frequency, Urinary Retention, Urinating at Night and Weak urinary stream. Musculoskeletal Present- Back Pain and Joint Pain. Not Present- Joint Swelling, Morning Stiffness, Muscle Pain, Muscle Weakness and Spasms. Neurological Not Present- Blackout spells, Difficulty with balance, Dizziness, Paralysis, Tremor and Weakness. Psychiatric Not Present- Insomnia.  Vitals  Weight: 175 lb Height: 67in Weight was reported by patient. Height was reported by patient. Body Surface Area: 1.91 m Body Mass Index: 27.41 kg/m  Pulse: 68 (Regular)  BP: 152/86 (Sitting, Right Arm, Standard)  Physical Exam  General Mental Status -Alert, cooperative and good historian. General Appearance-pleasant, Not in acute distress. Orientation-Oriented X3. Build & Nutrition-Well nourished and Well developed.  Head and Neck Head-normocephalic, atraumatic . Neck Global Assessment - supple, no bruit auscultated on the right, no bruit auscultated on the left.  Eye Pupil - Bilateral-Regular and Round. Motion - Bilateral-EOMI.  Chest and Lung Exam Auscultation Breath sounds -  clear at anterior chest wall and clear at posterior chest wall. Adventitious sounds - No Adventitious sounds.  Cardiovascular Auscultation Rhythm - Regular rate and rhythm. Heart Sounds - S1 WNL and S2 WNL. Murmurs & Other Heart Sounds - Auscultation of the heart reveals - No Murmurs.  Abdomen Palpation/Percussion Tenderness - Abdomen is non-tender to palpation. Rigidity (guarding) - Abdomen is soft. Auscultation Auscultation of the abdomen reveals - Bowel sounds normal.  Female Genitourinary Note: Not done, not pertinent to present illness   Musculoskeletal Note: On exam, she is in no distress. Her left knee shows no effusion. There was valgus deformity present. Her range of motion is about 0 to 125. She is tender lateral greater than medial. There is no instability noted. Pulse, sensation and motor intact. Left hip flexion about 100, to about 20 internal rotation, 30 external rotation, 30 abduction.  She had x-rays today. AP pelvis, lateral of the left hip showing that there is no progression in the degenerative changes of the hip. She still has some joint space left. AP both knees and lateral of the left show that she has bone-on-bone arthritis in the lateral and patellofemoral compartments of the left knee with valgus deformity.   Assessment & Plan  Primary osteoarthritis of left knee (M17.12)  Note:Surgical Plans: Left Total Knee Replacement  Disposition: Home  PCP: Dr. Jacky Kindle - Patient has been seen preoperatively and felt to be stable for surgery.  IV TXA  Anesthesia Issues: None  Patient was instructed on what medications to stop prior to surgery.  Signed electronically by Lauraine Rinne, III PA-C

## 2016-09-19 NOTE — Anesthesia Postprocedure Evaluation (Addendum)
Anesthesia Post Note  Patient: [name]  Procedure(s) Performed: Procedure(s) (LRB): LEFT TOTAL KNEE ARTHROPLASTY (Left)  Patient location during evaluation: PACU Anesthesia Type: General Level of consciousness: awake and alert Pain management: pain level controlled Vital Signs Assessment: post-procedure vital signs reviewed and stable Respiratory status: spontaneous breathing, nonlabored ventilation, respiratory function stable and patient connected to nasal cannula oxygen Cardiovascular status: blood pressure returned to baseline and stable Postop Assessment: no signs of nausea or vomiting Anesthetic complications: no       Last Vitals:  Vitals:   09/19/16 1300 09/19/16 1330  BP: (!) 161/75 (!) 170/72  Pulse: 68 68  Resp: (!) 21 20  Temp: 36.7 C 36.4 C    Last Pain:  Vitals:   09/19/16 1300  TempSrc:   PainSc: 8                  Shelton Silvas

## 2016-09-19 NOTE — Transfer of Care (Signed)
Immediate Anesthesia Transfer of Care Note  Patient: [name]  Procedure(s) Performed: Procedure(s) with comments: LEFT TOTAL KNEE ARTHROPLASTY (Left) - requests with abductor block  Patient Location: PACU  Anesthesia Type:General  Level of Consciousness: awake, alert , oriented and patient cooperative  Airway & Oxygen Therapy: Patient Spontanous Breathing and Patient connected to face mask oxygen  Post-op Assessment: Report given to RN and Post -op Vital signs reviewed and stable  Post vital signs: Reviewed and stable  Last Vitals:  Vitals:   09/19/16 1012 09/19/16 1014  BP: (!) 161/71   Pulse: 69 69  Resp: 11 10  Temp:      Last Pain:  Vitals:   09/19/16 0829  TempSrc: Oral      Patients Stated Pain Goal: 4 (09/19/16 0901)  Complications: No apparent anesthesia complications

## 2016-09-19 NOTE — Progress Notes (Signed)
PT Cancellation Note  Patient Details Name: [name] MRN: [id number] DOB: December 04, 1940   Cancelled Treatment:    Reason Eval/Treat Not Completed: Other (comment) (declines starting PT today)   Armanii Urbanik,KATHrine E 09/19/2016, 2:42 PM Zenovia Jarred, PT, DPT 09/19/2016 Pager: [phone]

## 2016-09-19 NOTE — Anesthesia Procedure Notes (Signed)
Anesthesia Regional Block: Adductor canal block   Pre-Anesthetic Checklist: ,, timeout performed, Correct Patient, Correct Site, Correct Laterality, Correct Procedure, Correct Position, site marked, Risks and benefits discussed,  Surgical consent,  Pre-op evaluation,  At surgeon's request and post-op pain management  Laterality: Left  Prep: chloraprep       Needles:  Injection technique: Single-shot  Needle Type: Echogenic Needle     Needle Length: 9cm  Needle Gauge: 21     Additional Needles:   Procedures: ultrasound guided,,,,,,,,  Narrative:  Start time: 09/19/2016 9:10 AM End time: 09/19/2016 9:15 AM Injection made incrementally with aspirations every 5 mL.  Performed by: Personally  Anesthesiologist: Estalee Mccandlish D  Additional Notes: Tolerated well.         

## 2016-09-19 NOTE — Progress Notes (Signed)
Assisted Dr. Hollis with left, ultrasound guided, adductor canal block. Side rails up, monitors on throughout procedure. See vital signs in flow sheet. Tolerated Procedure well.  

## 2016-09-19 NOTE — Anesthesia Preprocedure Evaluation (Addendum)
Anesthesia Evaluation  Patient identified by MRN, date of birth, ID band Patient awake    Reviewed: Allergy & Precautions, NPO status , Patient's Chart, lab work & pertinent test results  Airway Mallampati: II       Dental  (+) Teeth Intact, Dental Advisory Given   Pulmonary neg pulmonary ROS,    breath sounds clear to auscultation       Cardiovascular hypertension, Pt. on medications  Rhythm:Regular Rate:Normal     Neuro/Psych PSYCHIATRIC DISORDERS Anxiety negative neurological ROS     GI/Hepatic negative GI ROS, Neg liver ROS,   Endo/Other  negative endocrine ROS  Renal/GU negative Renal ROS  negative genitourinary   Musculoskeletal  (+) Arthritis , Osteoarthritis,    Abdominal   Peds negative pediatric ROS (+)  Hematology negative hematology ROS (+)   Anesthesia Other Findings   Reproductive/Obstetrics negative OB ROS                            Lab Results  Component Value Date   WBC 6.0 09/12/2016   HGB 12.3 09/12/2016   HCT 37.9 09/12/2016   MCV 91.3 09/12/2016   PLT 247 09/12/2016   Lab Results  Component Value Date   CREATININE 0.65 09/12/2016   BUN 20 09/12/2016   NA 141 09/12/2016   K 3.9 09/12/2016   CL 109 09/12/2016   CO2 25 09/12/2016   Lab Results  Component Value Date   INR 1.00 09/12/2016   INR 1.54 (H) 10/02/2009   INR 1.42 10/01/2009   EKG: normal sinus rhythm.   Anesthesia Physical Anesthesia Plan  ASA: II  Anesthesia Plan: General   Post-op Pain Management: GA combined w/ Regional for post-op pain   Induction: Intravenous  Airway Management Planned: LMA  Additional Equipment:   Intra-op Plan:   Post-operative Plan: Extubation in OR  Informed Consent: I have reviewed the patients History and Physical, chart, labs and discussed the procedure including the risks, benefits and alternatives for the proposed anesthesia with the patient or  authorized representative who has indicated his/her understanding and acceptance.   Dental advisory given  Plan Discussed with: CRNA  Anesthesia Plan Comments: (Pt declined spinal due to spine surgery in the past. )      Anesthesia Quick Evaluation

## 2016-09-19 NOTE — Interval H&P Note (Signed)
History and Physical Interval Note:  09/19/2016 9:19 AM  [name]  has presented today for surgery, with the diagnosis of Left knee osteoarthritis   The various methods of treatment have been discussed with the patient and family. After consideration of risks, benefits and other options for treatment, the patient has consented to  Procedure(s) with comments: LEFT TOTAL KNEE ARTHROPLASTY (Left) - requests as a surgical intervention .  The patient's history has been reviewed, patient examined, no change in status, stable for surgery.  I have reviewed the patient's chart and labs.  Questions were answered to the patient's satisfaction.     Loanne Drilling

## 2016-09-19 NOTE — Op Note (Signed)
OPERATIVE REPORT-TOTAL KNEE ARTHROPLASTY   Pre-operative diagnosis- Osteoarthritis  Left knee(s)  Post-operative diagnosis- Osteoarthritis Left knee(s)  Procedure-  Left  Total Knee Arthroplasty  Surgeon- Gus Rankin. Bastian Andreoli, MD  Assistant- Avel Peace, PA-C   Anesthesia-  GA combined with regional for post-op pain  EBL-* No blood loss amount entered *   Drains Hemovac  Tourniquet time-  Total Tourniquet Time Documented: Thigh (Left) - 36 minutes Total: Thigh (Left) - 36 minutes     Complications- None  Condition-PACU - hemodynamically stable.   Brief Clinical Note  [name] is a 76 y.o. year old female with end stage OA of her left knee with progressively worsening pain and dysfunction. She has constant pain, with activity and at rest and significant functional deficits with difficulties even with ADLs. She has had extensive non-op management including analgesics, injections of cortisone and viscosupplements, and home exercise program, but remains in significant pain with significant dysfunction. Radiographs show bone on bone arthritis lateral and patellofemoral with valgus deformity. She presents now for left Total Knee Arthroplasty.    Procedure in detail---   The patient is brought into the operating room and positioned supine on the operating table. After successful administration of  GA combined with regional for post-op pain,   a tourniquet is placed high on the  Left thigh(s) and the lower extremity is prepped and draped in the usual sterile fashion. Time out is performed by the operating team and then the  Left lower extremity is wrapped in Esmarch, knee flexed and the tourniquet inflated to 300 mmHg.       A midline incision is made with a ten blade through the subcutaneous tissue to the level of the extensor mechanism. A fresh blade is used to make a medial parapatellar arthrotomy. Soft tissue over the proximal medial tibia is subperiosteally elevated to the joint  line with a knife and into the semimembranosus bursa with a Cobb elevator. Soft tissue over the proximal lateral tibia is elevated with attention being paid to avoiding the patellar tendon on the tibial tubercle. The patella is everted, knee flexed 90 degrees and the ACL and PCL are removed. Findings are bone on bone lateral and patellofemoral with large global osteophytes.        The drill is used to create a starting hole in the distal femur and the canal is thoroughly irrigated with sterile saline to remove the fatty contents. The 5 degree Left  valgus alignment guide is placed into the femoral canal and the distal femoral cutting block is pinned to remove 10 mm off the distal femur. Resection is made with an oscillating saw.      The tibia is subluxed forward and the menisci are removed. The extramedullary alignment guide is placed referencing proximally at the medial aspect of the tibial tubercle and distally along the second metatarsal axis and tibial crest. The block is pinned to remove 2mm off the more deficient lateral  side. Resection is made with an oscillating saw. Size 2.5is the most appropriate size for the tibia and the proximal tibia is prepared with the modular drill and keel punch for that size.      The femoral sizing guide is placed and size 2.5 is most appropriate. Rotation is marked off the epicondylar axis and confirmed by creating a rectangular flexion gap at 90 degrees. The size 2.5 cutting block is pinned in this rotation and the anterior, posterior and chamfer cuts are made with the oscillating saw.  The intercondylar block is then placed and that cut is made.      Trial size 2.5 tibial component, trial size 2.5 posterior stabilized femur and a 12.5  mm posterior stabilized rotating platform insert trial is placed. Full extension is achieved with excellent varus/valgus and anterior/posterior balance throughout full range of motion. The patella is everted and thickness measured to be 22   mm. Free hand resection is taken to 12 mm, a 35 template is placed, lug holes are drilled, trial patella is placed, and it tracks normally. Osteophytes are removed off the posterior femur with the trial in place. All trials are removed and the cut bone surfaces prepared with pulsatile lavage. Cement is mixed and once ready for implantation, the size 2.5 tibial implant, size  2.5 posterior stabilized femoral component, and the size 35 patella are cemented in place and the patella is held with the clamp. The trial insert is placed and the knee held in full extension. The Exparel (20 ml mixed with 30 ml saline) is injected into the extensor mechanism, posterior capsule, medial and lateral gutters and subcutaneous tissues.  All extruded cement is removed and once the cement is hard the permanent 12.5 mm posterior stabilized rotating platform insert is placed into the tibial tray.      The wound is copiously irrigated with saline solution and the extensor mechanism closed over a hemovac drain with #1 V-loc suture. The tourniquet is released for a total tourniquet time of 36  minutes. Flexion against gravity is 140 degrees and the patella tracks normally. Subcutaneous tissue is closed with 2.0 vicryl and subcuticular with running 4.0 Monocryl. The incision is cleaned and dried and steri-strips and a bulky sterile dressing are applied. The limb is placed into a knee immobilizer and the patient is awakened and transported to recovery in stable condition.      Please note that a surgical assistant was a medical necessity for this procedure in order to perform it in a safe and expeditious manner. Surgical assistant was necessary to retract the ligaments and vital neurovascular structures to prevent injury to them and also necessary for proper positioning of the limb to allow for anatomic placement of the prosthesis.   Gus Rankin Shiraz Bastyr, MD    09/19/2016, 11:24 AM

## 2016-09-20 ENCOUNTER — Encounter (HOSPITAL_COMMUNITY): Payer: Self-pay | Admitting: Orthopedic Surgery

## 2016-09-20 LAB — BASIC METABOLIC PANEL
Anion gap: 7 (ref 5–15)
BUN: 11 mg/dL (ref 6–20)
CO2: 26 mmol/L (ref 22–32)
Calcium: 8.2 mg/dL — ABNORMAL LOW (ref 8.9–10.3)
Chloride: 102 mmol/L (ref 101–111)
Creatinine, Ser: 0.68 mg/dL (ref 0.44–1.00)
GFR calc Af Amer: >60 mL/min (ref >60)
GFR calc non Af Amer: >60 mL/min (ref >60)
Glucose, Bld: 126 mg/dL — ABNORMAL HIGH (ref 65–99)
Potassium: 3.5 mmol/L (ref 3.5–5.1)
Sodium: 135 mmol/L (ref 135–145)

## 2016-09-20 LAB — CBC
HCT: 31.8 % — ABNORMAL LOW (ref 36.0–46.0)
Hemoglobin: 10.6 g/dL — ABNORMAL LOW (ref 12.0–15.0)
MCH: 30 pg (ref 26.0–34.0)
MCHC: 33.3 g/dL (ref 30.0–36.0)
MCV: 90.1 fL (ref 78.0–100.0)
Platelets: 220 10*3/uL (ref 150–400)
RBC: 3.53 MIL/uL — ABNORMAL LOW (ref 3.87–5.11)
RDW: 13.8 % (ref 11.5–15.5)
WBC: 13 10*3/uL — ABNORMAL HIGH (ref 4.0–10.5)

## 2016-09-20 MED ORDER — HYDROMORPHONE HCL 2 MG/ML IJ SOLN: 0.5000 mg | INTRAMUSCULAR | Status: DC | PRN

## 2016-09-20 MED ORDER — RIVAROXABAN 10 MG PO TABS: 10.0000 mg | ORAL_TABLET | Freq: Every day | ORAL | 0 refills | Status: DC

## 2016-09-20 MED ORDER — GABAPENTIN 300 MG PO CAPS
300.0000 mg | ORAL_CAPSULE | Freq: Three times a day (TID) | ORAL | Status: DC | PRN
  Administered 2016-09-20 – 2016-09-21 (×2): 600 mg via ORAL
  Filled 2016-09-20 (×2): qty 2
  Filled 2016-09-20: qty 1

## 2016-09-20 MED ORDER — HYDROMORPHONE HCL 2 MG PO TABS: 2.0000 mg | ORAL_TABLET | ORAL | 0 refills | Status: DC | PRN

## 2016-09-20 MED ORDER — GABAPENTIN 300 MG PO CAPS: 300.0000 mg | ORAL_CAPSULE | Freq: Three times a day (TID) | ORAL | 0 refills | Status: DC

## 2016-09-20 MED ORDER — METHOCARBAMOL 500 MG PO TABS: 500.0000 mg | ORAL_TABLET | Freq: Four times a day (QID) | ORAL | 0 refills | Status: DC | PRN

## 2016-09-20 NOTE — Discharge Summary (Signed)
Physician Discharge Summary   Patient ID: [name] MRN: [id number] DOB/AGE: 1940-11-12 76 y.o.  Admit date: 09/19/2016 Discharge date: 09-22-2016  Primary Diagnosis:  Osteoarthritis  Left knee(s)  Admission Diagnoses:  Past Medical History:  Diagnosis Date  . Anxiety   . Arthritis   . High cholesterol   . Hypertension    Discharge Diagnoses:   Principal Problem:   OA (osteoarthritis) of knee  Estimated body mass index is 28.82 kg/m as calculated from the following:   Height as of this encounter: '5\' 7"'$  (1.702 m).   Weight as of this encounter: 83.5 kg (184 lb).  Procedure:  Procedure(s) (LRB): LEFT TOTAL KNEE ARTHROPLASTY (Left)   Consults: None  HPI: [name] is a 76 y.o. year old female with end stage OA of her left knee with progressively worsening pain and dysfunction. She has constant pain, with activity and at rest and significant functional deficits with difficulties even with ADLs. She has had extensive non-op management including analgesics, injections of cortisone and viscosupplements, and home exercise program, but remains in significant pain with significant dysfunction. Radiographs show bone on bone arthritis lateral and patellofemoral with valgus deformity. She presents now for left Total Knee Arthroplasty.    Laboratory Data: Admission on 09/19/2016  Component Date Value Ref Range Status  . WBC 09/20/2016 13.0* 4.0 - 10.5 K/uL Final  . RBC 09/20/2016 3.53* 3.87 - 5.11 MIL/uL Final  . Hemoglobin 09/20/2016 10.6* 12.0 - 15.0 g/dL Final  . HCT 09/20/2016 31.8* 36.0 - 46.0 % Final  . MCV 09/20/2016 90.1  78.0 - 100.0 fL Final  . MCH 09/20/2016 30.0  26.0 - 34.0 pg Final  . MCHC 09/20/2016 33.3  30.0 - 36.0 g/dL Final  . RDW 09/20/2016 13.8  11.5 - 15.5 % Final  . Platelets 09/20/2016 220  150 - 400 K/uL Final  . Sodium 09/20/2016 135  135 - 145 mmol/L Final  . Potassium 09/20/2016 3.5  3.5 - 5.1 mmol/L Final  . Chloride 09/20/2016 102  101 - 111  mmol/L Final  . CO2 09/20/2016 26  22 - 32 mmol/L Final  . Glucose, Bld 09/20/2016 126* 65 - 99 mg/dL Final  . BUN 09/20/2016 11  6 - 20 mg/dL Final  . Creatinine, Ser 09/20/2016 0.68  0.44 - 1.00 mg/dL Final  . Calcium 09/20/2016 8.2* 8.9 - 10.3 mg/dL Final  . GFR calc non Af Amer 09/20/2016 >60  >60 mL/min Final  . GFR calc Af Amer 09/20/2016 >60  >60 mL/min Final   Comment: (NOTE) The eGFR has been calculated using the CKD EPI equation. This calculation has not been validated in all clinical situations. eGFR's persistently <60 mL/min signify possible Chronic Kidney Disease.   [name] gap 09/20/2016 7  5 - 15 Final  Hospital Outpatient Visit on 09/12/2016  Component Date Value Ref Range Status  . aPTT 09/12/2016 28  24 - 36 seconds Final  . WBC 09/12/2016 6.0  4.0 - 10.5 K/uL Final  . RBC 09/12/2016 4.15  3.87 - 5.11 MIL/uL Final  . Hemoglobin 09/12/2016 12.3  12.0 - 15.0 g/dL Final  . HCT 09/12/2016 37.9  36.0 - 46.0 % Final  . MCV 09/12/2016 91.3  78.0 - 100.0 fL Final  . MCH 09/12/2016 29.6  26.0 - 34.0 pg Final  . MCHC 09/12/2016 32.5  30.0 - 36.0 g/dL Final  . RDW 09/12/2016 13.9  11.5 - 15.5 % Final  . Platelets 09/12/2016 247  150 - 400  K/uL Final  . Sodium 09/12/2016 141  135 - 145 mmol/L Final  . Potassium 09/12/2016 3.9  3.5 - 5.1 mmol/L Final  . Chloride 09/12/2016 109  101 - 111 mmol/L Final  . CO2 09/12/2016 25  22 - 32 mmol/L Final  . Glucose, Bld 09/12/2016 96  65 - 99 mg/dL Final  . BUN 09/12/2016 20  6 - 20 mg/dL Final  . Creatinine, Ser 09/12/2016 0.65  0.44 - 1.00 mg/dL Final  . Calcium 09/12/2016 9.0  8.9 - 10.3 mg/dL Final  . Total Protein 09/12/2016 6.9  6.5 - 8.1 g/dL Final  . Albumin 09/12/2016 3.8  3.5 - 5.0 g/dL Final  . AST 09/12/2016 19  15 - 41 U/L Final  . ALT 09/12/2016 12* 14 - 54 U/L Final  . Alkaline Phosphatase 09/12/2016 91  38 - 126 U/L Final  . Total Bilirubin 09/12/2016 0.5  0.3 - 1.2 mg/dL Final  . GFR calc non Af Amer 09/12/2016 >60   >60 mL/min Final  . GFR calc Af Amer 09/12/2016 >60  >60 mL/min Final   Comment: (NOTE) The eGFR has been calculated using the CKD EPI equation. This calculation has not been validated in all clinical situations. eGFR's persistently <60 mL/min signify possible Chronic Kidney Disease.   . Anion gap 09/12/2016 7  5 - 15 Final  . Prothrombin Time 09/12/2016 13.2  11.4 - 15.2 seconds Final  . INR 09/12/2016 1.00   Final  . ABO/RH(D) 09/12/2016 O NEG   Final  . Antibody Screen 09/12/2016 NEG   Final  . Sample Expiration 09/12/2016 09/22/2016   Final  . Extend sample reason 09/12/2016 NO TRANSFUSIONS OR PREGNANCY IN THE PAST 3 MONTHS   Final  . MRSA, PCR 09/12/2016 NEGATIVE  NEGATIVE Final  . Staphylococcus aureus 09/12/2016 NEGATIVE  NEGATIVE Final   Comment:        The Xpert SA Assay (FDA approved for NASAL specimens in patients over 19 years of age), is one component of a comprehensive surveillance program.  Test performance has been validated by [facility] for patients greater than or equal to 44 year old. It is not intended to diagnose infection nor to guide or monitor treatment.      X-Rays:No results found.  EKG: Orders placed or performed in visit on 12/09/15  . EKG 12-Lead     Hospital Course: ERIAN LARIVIERE is a 76 y.o. who was admitted to [facility]. They were brought to the operating room on 09/19/2016 and underwent Procedure(s): LEFT TOTAL KNEE ARTHROPLASTY.  Patient tolerated the procedure well and was later transferred to the recovery room and then to the orthopaedic floor for postoperative care.  They were given PO and IV analgesics for pain control following their surgery.  They were given 24 hours of postoperative antibiotics of  Anti-infectives    Start     Dose/Rate Route Frequency Ordered Stop   09/19/16 1700  ceFAZolin (ANCEF) IVPB 2g/100 mL premix     2 g 200 mL/hr over 30 Minutes Intravenous Every 6 hours 09/19/16 1339 09/19/16 2331   09/19/16  0835  ceFAZolin (ANCEF) 2-4 GM/100ML-% IVPB    Comments:  Harvell, Gwendolyn  : cabinet override      09/19/16 0835 09/19/16 1039   09/19/16 0827  ceFAZolin (ANCEF) IVPB 2g/100 mL premix     2 g 200 mL/hr over 30 Minutes Intravenous On call to O.R. 09/19/16 0827 09/19/16 1039     and started on DVT  prophylaxis in the form of Xarelto.   PT and OT were ordered for total joint protocol.  Discharge planning consulted to help with postop disposition and equipment needs.  Patient had a tough night on the evening of surgery.  They started to get up OOB with therapy on day one. Hemovac drain was pulled without difficulty.  Continued to work with therapy into day two.  Dressing was changed on day two and the incision was healing well.  By day three, the patient had progressed with therapy and meeting their goals.  Incision was healing well.  Patient was seen in rounds and was ready to go home.  Discharge home with home health Diet - Cardiac diet Follow up - in 2 weeks Activity - WBAT Disposition - Home Condition Upon Discharge - Good D/C Meds - See DC Summary DVT Prophylaxis - Xarelto  Discharge Instructions    Call MD / Call 911    Complete by:  As directed    If you experience chest pain or shortness of breath, CALL 911 and be transported to the hospital emergency room.  If you develope a fever above 101 F, pus (white drainage) or increased drainage or redness at the wound, or calf pain, call your surgeon's office.   Change dressing    Complete by:  As directed    Change dressing daily with sterile 4 x 4 inch gauze dressing and apply TED hose. Do not submerge the incision under water.   Constipation Prevention    Complete by:  As directed    Drink plenty of fluids.  Prune juice may be helpful.  You may use a stool softener, such as Colace (over the counter) 100 mg twice a day.  Use MiraLax (over the counter) for constipation as needed.   Diet - low sodium heart healthy    Complete by:  As  directed    Discharge instructions    Complete by:  As directed    Pick up stool softner and laxative for home use following surgery while on pain medications. Do not submerge incision under water. Please use good hand washing techniques while changing dressing each day. May shower starting three days after surgery. Please use a clean towel to pat the incision dry following showers. Continue to use ice for pain and swelling after surgery. Do not use any lotions or creams on the incision until instructed by your surgeon.  Wear both TED hose on both legs during the day every day for three weeks, but may have off at night at home.  Postoperative Constipation Protocol  Constipation - defined medically as fewer than three stools per week and severe constipation as less than one stool per week.  One of the most common issues patients have following surgery is constipation.  Even if you have a regular bowel pattern at home, your normal regimen is likely to be disrupted due to multiple reasons following surgery.  Combination of anesthesia, postoperative narcotics, change in appetite and fluid intake all can affect your bowels.  In order to avoid complications following surgery, here are some recommendations in order to help you during your recovery period.  Colace (docusate) - Pick up an over-the-counter form of Colace or another stool softener and take twice a day as long as you are requiring postoperative pain medications.  Take with a full glass of water daily.  If you experience loose stools or diarrhea, hold the colace until you stool forms back up.  If your symptoms  do not get better within 1 week or if they get worse, check with your doctor.  Dulcolax (bisacodyl) - Pick up over-the-counter and take as directed by the product packaging as needed to assist with the movement of your bowels.  Take with a full glass of water.  Use this product as needed if not relieved by Colace only.   MiraLax  (polyethylene glycol) - Pick up over-the-counter to have on hand.  MiraLax is a solution that will increase the amount of water in your bowels to assist with bowel movements.  Take as directed and can mix with a glass of water, juice, soda, coffee, or tea.  Take if you go more than two days without a movement. Do not use MiraLax more than once per day. Call your doctor if you are still constipated or irregular after using this medication for 7 days in a row.  If you continue to have problems with postoperative constipation, please contact the office for further assistance and recommendations.  If you experience "the worst abdominal pain ever" or develop nausea or vomiting, please contact the office immediatly for further recommendations for treatment.   Take Xarelto for two and a half more weeks, then discontinue Xarelto. Once the patient has completed the blood thinner regimen, then take a Baby 81 mg Aspirin daily for three more weeks.   Do not put a pillow under the knee. Place it under the heel.    Complete by:  As directed    Do not sit on low chairs, stoools or toilet seats, as it may be difficult to get up from low surfaces    Complete by:  As directed    Driving restrictions    Complete by:  As directed    No driving until released by the physician.   Increase activity slowly as tolerated    Complete by:  As directed    Lifting restrictions    Complete by:  As directed    No lifting until released by the physician.   Patient may shower    Complete by:  As directed    You may shower without a dressing once there is no drainage.  Do not wash over the wound.  If drainage remains, do not shower until drainage stops.   TED hose    Complete by:  As directed    Use stockings (TED hose) for 3 weeks on both leg(s).  You may remove them at night for sleeping.   Weight bearing as tolerated    Complete by:  As directed    Laterality:  left   Extremity:  Lower     Allergies as of 09/20/2016    No Known Allergies     Medication List    STOP taking these medications   cholecalciferol 1000 units tablet Commonly known as:  VITAMIN D   estradiol 0.5 MG tablet Commonly known as:  ESTRACE   meloxicam 15 MG tablet Commonly known as:  MOBIC   progesterone 100 MG capsule Commonly known as:  PROMETRIUM     TAKE these medications   clobetasol ointment 0.05 % Commonly known as:  TEMOVATE Apply topically 2 (two) times daily. As needed for itching   donepezil 5 MG tablet Commonly known as:  ARICEPT Take 5 mg by mouth daily.   enalapril 20 MG tablet Commonly known as:  VASOTEC Take 20 mg by mouth 2 (two) times daily.   gabapentin 300 MG capsule Commonly known as:  NEURONTIN Take 1  capsule (300 mg total) by mouth 3 (three) times daily. Gabapentin 300 mg Protocol Take a 300 mg capsule three times a day for one week, Then a 300 mg capsule twice a day for one week, Then a 300 mg capsule once a day for one week, then discontinue the Gabapentin.   HYDROmorphone 2 MG tablet Commonly known as:  DILAUDID Take 1-2 tablets (2-4 mg total) by mouth every 4 (four) hours as needed for moderate pain or severe pain.   methocarbamol 500 MG tablet Commonly known as:  ROBAXIN Take 1 tablet (500 mg total) by mouth every 6 (six) hours as needed for muscle spasms.   pravastatin 20 MG tablet Commonly known as:  PRAVACHOL Take 20 mg by mouth daily.   rivaroxaban 10 MG Tabs tablet Commonly known as:  XARELTO Take 1 tablet (10 mg total) by mouth daily with breakfast. Take Xarelto for two and a half more weeks following discharge from the hospital, then discontinue Xarelto. Once the patient has completed the blood thinner regimen, then take a Baby 81 mg Aspirin daily for three more weeks. Start taking on:  09/21/2016   sertraline 50 MG tablet Commonly known as:  ZOLOFT Take 50 mg by mouth daily.      Follow-up Information    [facility] Follow up.   Specialty:  [facility] Contact information: [address] [place] [address] [phone]        Gearlean Alf, MD. Schedule an appointment as soon as possible for a visit on 10/04/2016.   Specialty:  Orthopedic Surgery Contact information: [address] [place] [address] [phone]           Signed: Arlee Muslim, PA-C Orthopaedic Surgery 09/20/2016, 9:09 PM

## 2016-09-20 NOTE — Addendum Note (Signed)
Addendum  created 09/20/16 1007 by Elyn Peers, CRNA   Charge Capture section accepted

## 2016-09-20 NOTE — Evaluation (Signed)
Physical Therapy Evaluation Patient Details Name: [name] MRN: [id number] DOB: 10-Apr-1941 Today's Date: 09/20/2016   History of Present Illness  LTKA  Clinical Impression  The patient reuires assist for  Mobility.  Plans DC home with HHPT. Pt admitted with above diagnosis. Pt currently with functional limitations due to the deficits listed below (see PT Problem List). Pt will benefit from skilled PT to increase their independence and safety with mobility to allow discharge to the venue listed below.       Follow Up Recommendations Home health PT;Supervision/Assistance - 24 hour    Equipment Recommendations  None recommended by PT    Recommendations for Other Services       Precautions / Restrictions Precautions Precautions: Fall;Knee Required Braces or Orthoses: Knee Immobilizer - Left Knee Immobilizer - Left: Discontinue once straight leg raise with < 10 degree lag Restrictions Weight Bearing Restrictions: No      Mobility  Bed Mobility Overal bed mobility: Needs Assistance Bed Mobility: Supine to Sit     Supine to sit: Mod assist     General bed mobility comments: assist with trunk and left leg  Transfers Overall transfer level: Needs assistance Equipment used: Rolling walker (2 wheeled) Transfers: Sit to/from [employer] Sit to Stand: Mod assist;+2 physical assistance Stand pivot transfers: Mod assist;+2 physical assistance       General transfer comment: assist to stand at [facility].  became incontinent, BSC brought up and pivoted. The  assisted to stand and ambualtetd x 6 ' to recliner, cues for paoture  and sequence  Ambulation/Gait Ambulation/Gait assistance: Mod assist;+2 safety/equipment Ambulation Distance (Feet): 6 Feet Assistive device: Rolling walker (2 wheeled) Gait Pattern/deviations: Step-to pattern;Antalgic     General Gait Details: cues for sequence and posture  Stairs            Wheelchair Mobility    Modified  Rankin (Stroke Patients Only)       Balance                                             Pertinent Vitals/Pain Pain Assessment: 0-10 Pain Score: 9  Pain Location: Left knee  Pain Descriptors / Indicators: Aching;Sore Pain Intervention(s): Limited activity within patient's tolerance;Premedicated before session;Patient requesting pain meds-RN notified    Home Living Family/patient expects to be discharged to:: Private residence Living Arrangements: Alone Available Help at Discharge: Friend(s);Available PRN/intermittently Type of Home: House Home Access: Stairs to enter   Entrance Stairs-Number of Steps: 3 Home Layout: One level Home Equipment: Grab bars - toilet;Shower seat;Walker - 2 wheels;Walker - 4 wheels Additional Comments: multiple family to assist    Prior Function Level of Independence: Independent               Hand Dominance        Extremity/Trunk Assessment   Upper Extremity Assessment Upper Extremity Assessment: Defer to OT evaluation    Lower Extremity Assessment Lower Extremity Assessment: LLE deficits/detail LLE Deficits / Details: unable to perform SLR       Communication   Communication: No difficulties  Cognition Arousal/Alertness: Awake/alert Behavior During Therapy: WFL for tasks assessed/performed Overall Cognitive Status: Within Functional Limits for tasks assessed  General Comments      Exercises     Assessment/Plan    PT Assessment Patient needs continued PT services  PT Problem List Decreased strength;Decreased range of motion;Decreased activity tolerance;Decreased mobility;Decreased knowledge of precautions;Decreased safety awareness;Decreased knowledge of use of DME;Pain       PT Treatment Interventions DME instruction;Gait training;Functional mobility training;Stair training;Therapeutic activities;Patient/family education    PT Goals (Current  goals can be found in the Care Plan section)  Acute Rehab PT Goals Patient Stated Goal: to go home PT Goal Formulation: With patient Time For Goal Achievement: 09/24/16 Potential to Achieve Goals: Good    Frequency 7X/week   Barriers to discharge        Co-evaluation               End of Session Equipment Utilized During Treatment: Left knee immobilizer Activity Tolerance: Patient tolerated treatment well Patient left: in chair;with call bell/phone within reach;with chair alarm set Nurse Communication: Mobility status PT Visit Diagnosis: Difficulty in walking, not elsewhere classified (R26.2);Pain Pain - Right/Left: Left Pain - part of body: Knee    Time: 7829-5621 PT Time Calculation (min) (ACUTE ONLY): 24 min   Charges:   PT Evaluation $PT Eval Low Complexity: 1 Procedure PT Treatments $Therapeutic Activity: 8-22 mins   PT G CodesBlanchard Kelch PT [phone]   Rada Hay 09/20/2016, 1:28 PM

## 2016-09-20 NOTE — Care Management Note (Signed)
Case Management Note  Patient Details  Name: [name] MRN: [id number] Date of Birth: 11-18-1940  Subjective/Objective:     76 yo admitted for Left  Total Knee Arthroplasty               Action/Plan: Pt from home alone. HHPT ordered and when offered choice pt chooses [facility]. [facility] aware of referral. Pt states she has a RW,3in1,wheelchair and shower chair at home already. No other CM needs communicated.  Expected Discharge Date:  09/21/16               Expected Discharge Plan:  Home w Home Health Services  In-House Referral:     Discharge planning Services  CM Consult  Post Acute Care Choice:    Choice offered to:  Patient  DME Arranged:    DME Agency:     HH Arranged:  PT HH Agency:  [facility] (now [facility])  Status of Service:  In process, will continue to follow  If discussed at Long Length of Stay Meetings, dates discussed:    Additional CommentsBartholome Bill, RN 09/20/2016, 10:45 AM  [phone]

## 2016-09-20 NOTE — Progress Notes (Signed)
   Subjective: 1 Day Post-Op Procedure(s) (LRB): LEFT TOTAL KNEE ARTHROPLASTY (Left) Patient reports pain as mild and moderate.   Patient seen in rounds for Dr. Lequita Halt.  Tough night with pain and not much sleep. Patient is well, but has had some minor complaints of pain in the knee, requiring pain medications Plan is to go Home after hospital stay.  Objective: Vital signs in last 24 hours: Temp:  [98 F (36.7 C)-99.1 F (37.3 C)] 98.6 F (37 C) (04/17 1410) Pulse Rate:  [67-74] 73 (04/17 1410) Resp:  [16-18] 16 (04/17 1410) BP: (122-161)/(63-69) 161/63 (04/17 1410) SpO2:  [96 %-99 %] 96 % (04/17 1410)  Intake/Output from previous day:  Intake/Output Summary (Last 24 hours) at 09/20/16 2103 Last data filed at 09/20/16 1640  Gross per 24 hour  Intake          1666.16 ml  Output             2110 ml  Net          -443.84 ml    Intake/Output this shift: No intake/output data recorded.  Labs:  Recent Labs  09/20/16 0511  HGB 10.6*    Recent Labs  09/20/16 0511  WBC 13.0*  RBC 3.53*  HCT 31.8*  PLT 220    Recent Labs  09/20/16 0511  NA 135  K 3.5  CL 102  CO2 26  BUN 11  CREATININE 0.68  GLUCOSE 126*  CALCIUM 8.2*   No results for input(s): LABPT, INR in the last 72 hours.  EXAM General - Patient is Alert, Appropriate and Oriented Extremity - Neurovascular intact Sensation intact distally Intact pulses distally Dorsiflexion/Plantar flexion intact Dressing - dressing C/D/I Motor Function - intact, moving foot and toes well on exam.  Hemovac pulled without difficulty.  Past Medical History:  Diagnosis Date  . Anxiety   . Arthritis   . High cholesterol   . Hypertension     Assessment/Plan: 1 Day Post-Op Procedure(s) (LRB): LEFT TOTAL KNEE ARTHROPLASTY (Left) Principal Problem:   OA (osteoarthritis) of knee  Estimated body mass index is 28.82 kg/m as calculated from the following:   Height as of this encounter:  (1.702 m).   Weight  as of this encounter: 83.5 kg (184 lb). Advance diet Up with therapy Discharge home with home health when ready  DVT Prophylaxis - Xarelto Weight-Bearing as tolerated to left leg D/C O2 and Pulse OX and try on Room Air Changed the IV pain medication HGB 10.6  Avel Peace, PA-C Orthopaedic Surgery 09/20/2016, 9:03 PM

## 2016-09-20 NOTE — Evaluation (Signed)
Occupational Therapy Evaluation Patient Details Name: [name] MRN: [id number] DOB: February 26, 1941 Today's Date: 09/20/2016    History of Present Illness s/p L TKA   Clinical Impression   Pt is a 76 y/o F who presents with the above. Pt lives alone and reports she will have intermittent assistance upon return home (reports she could initially have assistance 24 hr assistance if needed). Pt will benefit from continued acute OT services to increase safety and independence with ADLs and functional mobility. Goals have been set for [facility] - [facility].     Follow Up Recommendations  Supervision/Assistance - 24 hour (initially)    Equipment Recommendations  None recommended by OT           Precautions / Restrictions Precautions Precautions: Fall;Knee Required Braces or Orthoses: Knee Immobilizer - Left Knee Immobilizer - Left: Discontinue once straight leg raise with < 10 degree lag Restrictions Weight Bearing Restrictions: No Other Position/Activity Restrictions: WBAT LLE       Mobility Bed Mobility Overal bed mobility: Needs Assistance Bed Mobility: Supine to Sit     Supine to sit: Min assist     General bed mobility comments: for LLE; HOB elevated   Transfers Overall transfer level: Needs assistance Equipment used: Rolling walker (2 wheeled) Transfers: Sit to/from Stand Sit to Stand: Min assist        General transfer comment: Pt completed side steps along bedside, verbal cues for hand/LE placement    Balance Overall balance assessment: Needs assistance Sitting-balance support: Bilateral upper extremity supported Sitting balance-Leahy Scale: Good                                     ADL either performed or assessed with clinical judgement   ADL Overall ADL's : Needs assistance/impaired Eating/Feeding: Sitting;Independent   Grooming: Wash/dry hands;Sitting;Min guard   Upper Body Bathing: Min guard;Sitting   Lower Body Bathing: Minimal  assistance;Sit to/from stand   Upper Body Dressing : Min guard;Sitting   Lower Body Dressing: Moderate assistance;Sit to/from stand   Toilet Transfer: Stand-pivot;BSC;RW;Minimal assistance   Toileting- Architect and Hygiene: Minimal assistance;Sit to/from stand       Functional mobility during ADLs: Min guard;Rolling walker (side steps along bedside) General ADL Comments: Pt just returned to bed from using [facility] and working with PT; completed EOB ADLs and functional mobility side stepping along bedside to reposition for bed mobility; Pt education provided on knee precautions                          Pertinent Vitals/Pain Pain Assessment: Faces  Faces Pain Scale: Hurts a little bit Pain Location: Left knee  Pain Descriptors / Indicators: Sore Pain Intervention(s): Limited activity within patient's tolerance;Monitored during session;Ice applied     Hand Dominance     Extremity/Trunk Assessment Upper Extremity Assessment Upper Extremity Assessment: Overall WFL for tasks assessed          Communication Communication Communication: No difficulties   Cognition Arousal/Alertness: Awake/alert Behavior During Therapy: WFL for tasks assessed/performed Overall Cognitive Status: Within Functional Limits for tasks assessed  Home Living Family/patient expects to be discharged to:: Private residence Living Arrangements: Alone Available Help at Discharge: Friend(s);Available PRN/intermittently Type of Home: House Home Access: Stairs to enter [employer] of Steps: 3   Home Layout: One level     Bathroom Shower/Tub: Producer, television/film/video: Handicapped height     Home Equipment: Grab bars - toilet;Shower seat;Walker - 2 wheels;Walker - 4 wheels   Additional Comments: multiple family to assist      Prior Functioning/Environment Level of Independence:  Independent                 OT Problem List: Decreased strength;Decreased activity tolerance      OT Treatment/Interventions: Self-care/ADL training;Therapeutic exercise;Therapeutic activities;DME and/or AE instruction;Patient/family education    OT Goals(Current goals can be found in the care plan section) Acute Rehab OT Goals Patient Stated Goal: to be independent with ADLs OT Goal Formulation: With patient Time For Goal Achievement: 09/27/16 Potential to Achieve Goals: Good ADL Goals Pt Will Perform Grooming: with min guard assist;standing Pt Will Perform Lower Body Dressing: with min assist;with adaptive equipment;sit to/from stand ((with AE PRN)) Pt Will Transfer to Toilet: with min guard assist;ambulating;regular height toilet;grab bars Pt Will Perform Tub/Shower Transfer: with min assist;ambulating;shower seat;rolling walker  OT Frequency: Min 2X/week                             End of Session Equipment Utilized During Treatment: Gait belt;Rolling walker;Left knee immobilizer  Activity Tolerance: Patient tolerated treatment well Patient left: in bed;with call bell/phone within reach  OT Visit Diagnosis: Muscle weakness (generalized) (M62.81);Unsteadiness on feet (R26.81)                Time: 1610-9604 OT Time Calculation (min): 19 min Charges:  OT General Charges $OT Visit: 1 Procedure OT Evaluation $OT Eval Low Complexity: 1 Procedure G-Codes:     Marcy Siren, OT Pager [phone] 09/20/2016   [name] 09/20/2016, 3:41 PM

## 2016-09-20 NOTE — Progress Notes (Signed)
Physical Therapy Treatment Patient Details Name: [name] MRN: [id number] DOB: April 03, 1941 Today's Date: 09/20/2016    History of Present Illness s/p L TKA    PT Comments    POD # 1 pm session Pt just got back to bed from bathroom so performed all supine TKR TE's followed by ICE.   Follow Up Recommendations  Home health PT;Supervision/Assistance - 24 hour     Equipment Recommendations  None recommended by PT    Recommendations for Other Services       Precautions / Restrictions Precautions Precautions: Fall;Knee Required Braces or Orthoses: Knee Immobilizer - Left Knee Immobilizer - Left: Discontinue once straight leg raise with < 10 degree lag Restrictions Weight Bearing Restrictions: No Other Position/Activity Restrictions: WBAT LLE              Exercises Total Knee Replacement TE's 10 reps B LE ankle pumps 10 reps towel squeezes 10 reps knee presses 10 reps heel slides  10 reps SAQ's 10 reps SLR's 10 reps ABD Followed by ICE   General Comments        Pertinent Vitals/Pain Pain Assessment: 0-10 Pain Score: 4  Pain Location: Left knee  Pain Descriptors / Indicators: Aching;Grimacing;Operative site guarding Pain Intervention(s): Monitored during session;Ice applied    Home Living Family/patient expects to be discharged to:: Private residence Living Arrangements: Alone Available Help at Discharge: Friend(s);Available PRN/intermittently Type of Home: House Home Access: Stairs to enter   Home Layout: One level Home Equipment: Grab bars - toilet;Shower seat;Walker - 2 wheels;Walker - 4 wheels Additional Comments: multiple family to assist    Prior Function Level of Independence: Independent          PT Goals (current goals can now be found in the care plan section) Acute Rehab PT Goals Patient Stated Goal: to go home PT Goal Formulation: With patient Time For Goal Achievement: 09/24/16 Potential to Achieve Goals: Good Progress towards PT  goals: Progressing toward goals    Frequency    7X/week      PT Plan Current plan remains appropriate    Co-evaluation             End of Session Equipment Utilized During Treatment: Left knee immobilizer Activity Tolerance: Patient tolerated treatment well Patient left: in bed;with call bell/phone within reach Nurse Communication: Mobility status PT Visit Diagnosis: Difficulty in walking, not elsewhere classified (R26.2);Pain Pain - Right/Left: Left Pain - part of body: Knee     Time: 1400-1416 PT Time Calculation (min) (ACUTE ONLY): 16 min  Charges:  $Therapeutic Exercise: 8-22 mins                    G Codes:       {Tiffanie Blassingame  PTA [facility]  Acute  Rehab Pager      [phone]

## 2016-09-20 NOTE — Discharge Instructions (Addendum)
° °Dr. Frank Aluisio °Total Joint Specialist °[facility] °[address] °[place], [place] [address] °[phone] ° °TOTAL KNEE REPLACEMENT POSTOPERATIVE DIRECTIONS ° °Knee Rehabilitation, Guidelines Following Surgery  °Results after knee surgery are often greatly improved when you follow the exercise, range of motion and muscle strengthening exercises prescribed by your doctor. Safety measures are also important to protect the knee from further injury. Any time any of these exercises cause you to have increased pain or swelling in your knee joint, decrease the amount until you are comfortable again and slowly increase them. If you have problems or questions, call your caregiver or physical therapist for advice.  ° °HOME CARE INSTRUCTIONS  °Remove items at home which could result in a fall. This includes throw rugs or furniture in walking pathways.  °· ICE to the affected knee every three hours for 30 minutes at a time and then as needed for pain and swelling.  Continue to use ice on the knee for pain and swelling from surgery. You may notice swelling that will progress down to the foot and ankle.  This is normal after surgery.  Elevate the leg when you are not up walking on it.   °· Continue to use the breathing machine which will help keep your temperature down.  It is common for your temperature to cycle up and down following surgery, especially at night when you are not up moving around and exerting yourself.  The breathing machine keeps your lungs expanded and your temperature down. °· Do not place pillow under knee, focus on keeping the knee straight while resting ° °DIET °You may resume your previous home diet once your are discharged from the hospital. ° °DRESSING / WOUND CARE / SHOWERING °You may shower 3 days after surgery, but keep the wounds dry during showering.  You may use an occlusive plastic wrap (Press'n Seal for example), NO SOAKING/SUBMERGING IN THE BATHTUB.  If the  bandage gets wet, change with a clean dry gauze.  If the incision gets wet, pat the wound dry with a clean towel. °You may start showering once you are discharged home but do not submerge the incision under water. Just pat the incision dry and apply a dry gauze dressing on daily. °Change the surgical dressing daily and reapply a dry dressing each time. ° °ACTIVITY °Walk with your walker as instructed. °Use walker as long as suggested by your caregivers. °Avoid periods of inactivity such as sitting longer than an hour when not asleep. This helps prevent blood clots.  °You may resume a sexual relationship in one month or when given the OK by your doctor.  °You may return to work once you are cleared by your doctor.  °Do not drive a car for 6 weeks or until released by you surgeon.  °Do not drive while taking narcotics. ° °WEIGHT BEARING °Weight bearing as tolerated with assist device (walker, cane, etc) as directed, use it as long as suggested by your surgeon or therapist, typically at least 4-6 weeks. ° °POSTOPERATIVE CONSTIPATION PROTOCOL °Constipation - defined medically as fewer than three stools per week and severe constipation as less than one stool per week. ° °One of the most common issues patients have following surgery is constipation.  Even if you have a regular bowel pattern at home, your normal regimen is likely to be disrupted due to multiple reasons following surgery.  Combination of anesthesia, postoperative narcotics, change in appetite and fluid intake all can affect your bowels.    In order to avoid complications following surgery, here are some recommendations in order to help you during your recovery period. ° °Colace (docusate) - Pick up an over-the-counter form of Colace or another stool softener and take twice a day as long as you are requiring postoperative pain medications.  Take with a full glass of water daily.  If you experience loose stools or diarrhea, hold the colace until you stool forms  back up.  If your symptoms do not get better within 1 week or if they get worse, check with your doctor. ° °Dulcolax (bisacodyl) - Pick up over-the-counter and take as directed by the product packaging as needed to assist with the movement of your bowels.  Take with a full glass of water.  Use this product as needed if not relieved by Colace only.  ° °MiraLax (polyethylene glycol) - Pick up over-the-counter to have on hand.  MiraLax is a solution that will increase the amount of water in your bowels to assist with bowel movements.  Take as directed and can mix with a glass of water, juice, soda, coffee, or tea.  Take if you go more than two days without a movement. °Do not use MiraLax more than once per day. Call your doctor if you are still constipated or irregular after using this medication for 7 days in a row. ° °If you continue to have problems with postoperative constipation, please contact the office for further assistance and recommendations.  If you experience "the worst abdominal pain ever" or develop nausea or vomiting, please contact the office immediatly for further recommendations for treatment. ° °ITCHING ° If you experience itching with your medications, try taking only a single pain pill, or even half a pain pill at a time.  You can also use Benadryl over the counter for itching or also to help with sleep.  ° °TED HOSE STOCKINGS °Wear the elastic stockings on both legs for three weeks following surgery during the day but you may remove then at night for sleeping. ° °MEDICATIONS °See your medication summary on the “After Visit Summary” that the nursing staff will review with you prior to discharge.  You may have some home medications which will be placed on hold until you complete the course of blood thinner medication.  It is important for you to complete the blood thinner medication as prescribed by your surgeon.  Continue your approved medications as instructed at time of  discharge. ° °PRECAUTIONS °If you experience chest pain or shortness of breath - call 911 immediately for transfer to the hospital emergency department.  °If you develop a fever greater that 101 F, purulent drainage from wound, increased redness or drainage from wound, foul odor from the wound/dressing, or calf pain - CONTACT YOUR SURGEON.   °                                                °FOLLOW-UP APPOINTMENTS °Make sure you keep all of your appointments after your operation with your surgeon and caregivers. You should call the office at the above phone number and make an appointment for approximately two weeks after the date of your surgery or on the date instructed by your surgeon outlined in the "After Visit Summary". ° ° °RANGE OF MOTION AND STRENGTHENING EXERCISES  °Rehabilitation of the knee is important following a knee injury or   an operation. After just a few days of immobilization, the muscles of the thigh which control the knee become weakened and shrink (atrophy). Knee exercises are designed to build up the tone and strength of the thigh muscles and to improve knee motion. Often times heat used for twenty to thirty minutes before working out will loosen up your tissues and help with improving the range of motion but do not use heat for the first two weeks following surgery. These exercises can be done on a training (exercise) mat, on the floor, on a table or on a bed. Use what ever works the best and is most comfortable for you Knee exercises include:  °Leg Lifts - While your knee is still immobilized in a splint or cast, you can do straight leg raises. Lift the leg to 60 degrees, hold for 3 sec, and slowly lower the leg. Repeat 10-20 times 2-3 times daily. Perform this exercise against resistance later as your knee gets better.  °Quad and Hamstring Sets - Tighten up the muscle on the front of the thigh (Quad) and hold for 5-10 sec. Repeat this 10-20 times hourly. Hamstring sets are done by pushing the  foot backward against an object and holding for 5-10 sec. Repeat as with quad sets.  °· Leg Slides: Lying on your back, slowly slide your foot toward your buttocks, bending your knee up off the floor (only go as far as is comfortable). Then slowly slide your foot back down until your leg is flat on the floor again. °· Angel Wings: Lying on your back spread your legs to the side as far apart as you can without causing discomfort.  °A rehabilitation program following serious knee injuries can speed recovery and prevent re-injury in the future due to weakened muscles. Contact your doctor or a physical therapist for more information on knee rehabilitation.  ° °IF YOU ARE TRANSFERRED TO A SKILLED REHAB FACILITY °If the patient is transferred to a skilled rehab facility following release from the hospital, a list of the current medications will be sent to the facility for the patient to continue.  When discharged from the skilled rehab facility, please have the facility set up the patient's Home Health Physical Therapy prior to being released. Also, the skilled facility will be responsible for providing the patient with their medications at time of release from the facility to include their pain medication, the muscle relaxants, and their blood thinner medication. If the patient is still at the rehab facility at time of the two week follow up appointment, the skilled rehab facility will also need to assist the patient in arranging follow up appointment in our office and any transportation needs. ° °MAKE SURE YOU:  °Understand these instructions.  °Get help right away if you are not doing well or get worse.  ° ° °Pick up stool softner and laxative for home use following surgery while on pain medications. °Do not submerge incision under water. °Please use good hand washing techniques while changing dressing each day. °May shower starting three days after surgery. °Please use a clean towel to pat the incision dry following  showers. °Continue to use ice for pain and swelling after surgery. °Do not use any lotions or creams on the incision until instructed by your surgeon. ° °Take Xarelto for two and a half more weeks following discharge from the hospital, then discontinue Xarelto. °Once the patient has completed the blood thinner regimen, then take a Baby 81 mg Aspirin daily for three   more weeks.    Information on my medicine - XARELTO (Rivaroxaban)  This medication education was reviewed with me or my healthcare representative as part of my discharge preparation.  The pharmacist that spoke with me during my hospital stay was:  Otho Bellows, [facility]  Why was Xarelto prescribed for you? Xarelto was prescribed for you to reduce the risk of blood clots forming after orthopedic surgery. The medical term for these abnormal blood clots is venous thromboembolism (VTE).  What do you need to know about xarelto ? Take your Xarelto ONCE DAILY at the same time every day. You may take it either with or without food.  If you have difficulty swallowing the tablet whole, you may crush it and mix in applesauce just prior to taking your dose.  Take Xarelto exactly as prescribed by your doctor and DO NOT stop taking Xarelto without talking to the doctor who prescribed the medication.  Stopping without other VTE prevention medication to take the place of Xarelto may increase your risk of developing a clot.  After discharge, you should have regular check-up appointments with your healthcare provider that is prescribing your Xarelto.    What do you do if you miss a dose? If you miss a dose, take it as soon as you remember on the same day then continue your regularly scheduled once daily regimen the next day. Do not take two doses of Xarelto on the same day.   Important Safety Information A possible side effect of Xarelto is bleeding. You should call your healthcare provider right away if you experience any of the  following: ? Bleeding from an injury or your nose that does not stop. ? Unusual colored urine (red or dark brown) or unusual colored stools (red or black). ? Unusual bruising for unknown reasons. ? A serious fall or if you hit your head (even if there is no bleeding).  Some medicines may interact with Xarelto and might increase your risk of bleeding while on Xarelto. To help avoid this, consult your healthcare provider or pharmacist prior to using any new prescription or non-prescription medications, including herbals, vitamins, non-steroidal anti-inflammatory drugs (NSAIDs) and supplements.  Discussed holding Mobic & Aspirin while on Xarelto  This website has more information on Xarelto: [email or web address].com.br.

## 2016-09-21 LAB — BASIC METABOLIC PANEL
Anion gap: 9 (ref 5–15)
BUN: 12 mg/dL (ref 6–20)
CO2: 26 mmol/L (ref 22–32)
Calcium: 8.6 mg/dL — ABNORMAL LOW (ref 8.9–10.3)
Chloride: 102 mmol/L (ref 101–111)
Creatinine, Ser: 0.67 mg/dL (ref 0.44–1.00)
GFR calc Af Amer: >60 mL/min (ref >60)
GFR calc non Af Amer: >60 mL/min (ref >60)
Glucose, Bld: 119 mg/dL — ABNORMAL HIGH (ref 65–99)
Potassium: 3.1 mmol/L — ABNORMAL LOW (ref 3.5–5.1)
Sodium: 137 mmol/L (ref 135–145)

## 2016-09-21 LAB — CBC
HCT: 32.9 % — ABNORMAL LOW (ref 36.0–46.0)
Hemoglobin: 10.8 g/dL — ABNORMAL LOW (ref 12.0–15.0)
MCH: 29.9 pg (ref 26.0–34.0)
MCHC: 32.8 g/dL (ref 30.0–36.0)
MCV: 91.1 fL (ref 78.0–100.0)
Platelets: 252 10*3/uL (ref 150–400)
RBC: 3.61 MIL/uL — ABNORMAL LOW (ref 3.87–5.11)
RDW: 14 % (ref 11.5–15.5)
WBC: 15.9 10*3/uL — ABNORMAL HIGH (ref 4.0–10.5)

## 2016-09-21 MED ORDER — ENALAPRIL MALEATE 20 MG PO TABS
20.0000 mg | ORAL_TABLET | Freq: Two times a day (BID) | ORAL | Status: DC
  Administered 2016-09-21 – 2016-09-22 (×2): 20 mg via ORAL
  Filled 2016-09-21 (×2): qty 1

## 2016-09-21 NOTE — Progress Notes (Addendum)
Occupational Therapy Treatment Patient Details Name: [name] MRN: [id number] DOB: December 22, 1940 Today's Date: 09/21/2016    History of present illness s/p L TKA   OT comments  Pt progressing towards goals with increased functional mobility this session (requires increased time to complete). Education provided on AE and safety during ADL task completion; discussed with Pt having 24 assistance initially upon return home to increase safety during ADLs and functional mobility with Pt agreeable. Questions answered and education provided. Continue per POC.    Follow Up Recommendations  Supervision/Assistance - 24 hour    Equipment Recommendations  None recommended by OT          Precautions / Restrictions Precautions Precautions: Fall;Knee Required Braces or Orthoses: Knee Immobilizer - Left Restrictions Other Position/Activity Restrictions: WBAT LLE        Mobility Bed Mobility Overal bed mobility: Needs Assistance Bed Mobility: Supine to Sit     Supine to sit: Min assist     General bed mobility comments: for LLE; HOB elevated   Transfers Overall transfer level: Needs assistance Equipment used: Rolling walker (2 wheeled) Transfers: Sit to/from Stand Sit to Stand: Min assist         General transfer comment: verbal cues for hand/LE placement    Balance Overall balance assessment: Needs assistance Sitting-balance support: Bilateral upper extremity supported Sitting balance-Leahy Scale: Good     Standing balance support: Bilateral upper extremity supported;During functional activity   Standing balance comment: requires support to maintain balance                           ADL either performed or assessed with clinical judgement   ADL Overall ADL's : Needs assistance/impaired Eating/Feeding: Sitting;Independent   Grooming: Oral care;Wash/dry face;Min guard;Standing               Lower Body Dressing: Moderate assistance;Sit to/from  stand Lower Body Dressing Details (indicate cue type and reason): Reviewed use of AE and techniques for completing LB dressing.  Toilet Transfer: Minimal assistance;Ambulation;Comfort height toilet;Grab bars;RW           Functional mobility during ADLs: Min guard;Rolling walker General ADL Comments: Pt completed bed mobility, room level functional mobility with increased time, standing grooming ADLs. Educated Pt on techniques and AE for LB dressing and bathing, educated on safety during bathing (Pt plans to wash up at sink initially)                       Cognition Arousal/Alertness: Awake/alert Behavior During Therapy: WFL for tasks assessed/performed Overall Cognitive Status: Within Functional Limits for tasks assessed                                                      General Comments      Pertinent Vitals/ Pain       Pain Assessment: Faces Faces Pain Scale: Hurts little more Pain Location: Left knee  Pain Descriptors / Indicators: Sore Pain Intervention(s): Limited activity within patient's tolerance;Monitored during session;Ice applied  Home Living  Frequency  Min 2X/week        Progress Toward Goals  OT Goals(current goals can now be found in the care plan section)  Progress towards OT goals: Progressing toward goals  Acute Rehab OT Goals Patient Stated Goal: to be independent with ADLs OT Goal Formulation: With patient Time For Goal Achievement: 09/27/16 Potential to Achieve Goals: Good  Plan Discharge plan remains appropriate    End of Session Equipment Utilized During Treatment: Gait belt;Rolling walker;Left knee immobilizer  OT Visit Diagnosis: Muscle weakness (generalized) (M62.81);Unsteadiness on feet (R26.81)   Activity Tolerance Patient tolerated treatment well   Patient Left in chair;with call bell/phone within reach;with chair alarm  set             Time: [phone] OT Time Calculation (min): 33 min  Charges: OT General Charges $OT Visit: 1 Procedure OT Treatments $Self Care/Home Management : 23-37 mins  Marcy Siren, OT Pager [phone] 09/21/2016    [name] 09/21/2016, 9:51 AM

## 2016-09-21 NOTE — Progress Notes (Deleted)
09/21/16 2240 Nursing   Aundria Rud PA called regarding patients high blood pressure tonight. Order to renew vasotec medicine tonight

## 2016-09-21 NOTE — Progress Notes (Signed)
Physical Therapy Treatment Patient Details Name: [name] MRN: [id number] DOB: 1941/01/18 Today's Date: 09/21/2016    History of Present Illness s/p L TKA    PT Comments    POD # 2 Pt progressing slowly and still demonstrates an unsteady/shaky gait.  Assisted with amb in hallway then back to bed.  Performed some TKR TE's but could not tolerate all due to pain.  Pt nervous/anxious about how she is going to function.  Pt lives home alone and plans to have a friend help but that friend works and won't be there all the time.  Pt would benefit from an extra day.   Follow Up Recommendations  Home health PT;Supervision/Assistance - 24 hour     Equipment Recommendations  None recommended by PT    Recommendations for Other Services       Precautions / Restrictions Precautions Precautions: Fall;Knee Precaution Comments: instructed on KI use Required Braces or Orthoses: Knee Immobilizer - Left Knee Immobilizer - Left: Discontinue once straight leg raise with < 10 degree lag Restrictions Weight Bearing Restrictions: No Other Position/Activity Restrictions: WBAT LLE     Mobility  Bed Mobility Overal bed mobility: Needs Assistance Bed Mobility: Sit to Supine       Sit to supine: Min assist   General bed mobility comments: assist L LE up onto bed  Transfers Overall transfer level: Needs assistance Equipment used: Rolling walker (2 wheeled) Transfers: Sit to/from Stand Sit to Stand: Min assist         General transfer comment: 50% verbal cues for hand/LE placement and safety with turns  Ambulation/Gait Ambulation/Gait assistance: Mod assist;Min assist Ambulation Distance (Feet): 18 Feet Assistive device: Rolling walker (2 wheeled) Gait Pattern/deviations: Step-to pattern;Antalgic Gait velocity: decreased   General Gait Details: tolerated an increased distance but still very unsteady requiring Min Assist esp with turns.  Pt lives home alone and plans to have "a  friend" help her but this friend works and won't be there all the time.  Pt is still a HIGH FALL RISK.     Stairs            Wheelchair Mobility    Modified Rankin (Stroke Patients Only)       Balance                                            Cognition Arousal/Alertness: Awake/alert Behavior During Therapy: WFL for tasks assessed/performed Overall Cognitive Status: Within Functional Limits for tasks assessed                                 General Comments: mild anxiety/worry      Exercises      General Comments        Pertinent Vitals/Pain Pain Assessment: 0-10 Pain Score: 8 Pain Descriptors / Indicators: Operative site guarding;Sore Pain Intervention(s): Monitored during session;Repositioned;Ice applied    Home Living                      Prior Function            PT Goals (current goals can now be found in the care plan section) Progress towards PT goals: Progressing toward goals    Frequency    7X/week      PT Plan Current plan remains  appropriate    Co-evaluation             End of Session Equipment Utilized During Treatment: Left knee immobilizer Activity Tolerance: Patient limited by fatigue Patient left: in bed;with bed alarm set;with call bell/phone within reach Nurse Communication: Mobility status (pt not moving as well as expected) PT Visit Diagnosis: Difficulty in walking, not elsewhere classified (R26.2);Pain     Time: 6568-1275 PT Time Calculation (min) (ACUTE ONLY): 23 min  Charges:  $Gait Training: 8-22 mins $Therapeutic Activity: 8-22 mins                    G Codes:       {Ronak Duquette  PTA [facility]  Acute  Rehab Pager      [phone]

## 2016-09-21 NOTE — Progress Notes (Signed)
Physical Therapy Treatment Patient Details Name: [name] MRN: [id number] DOB: 05/16/1941 Today's Date: 09/21/2016    History of Present Illness s/p L TKA    PT Comments    POD # 2 am session Applied KI and instructed on use.  Assisted with amb to bathroom.  Assisted in bathroom as pt had X 1 LOB with turns.  Assisted with amb in hallway a limited distance due to fatigue and unsteady gait.  Assisted back to bed per pt request to rest.  Applied CPM 0 - 45 degrees and applied ICE to knee. Will return this afternoon for a second session.  Pt not meeting goals to D/C to home today.  Follow Up Recommendations  Home health PT;Supervision/Assistance - 24 hour     Equipment Recommendations  None recommended by PT    Recommendations for Other Services       Precautions / Restrictions Precautions Precautions: Fall;Knee Precaution Comments: instructed on KI use Required Braces or Orthoses: Knee Immobilizer - Left Knee Immobilizer - Left: Discontinue once straight leg raise with < 10 degree lag Restrictions Weight Bearing Restrictions: No Other Position/Activity Restrictions: WBAT LLE     Mobility  Bed Mobility Overal bed mobility: Needs Assistance Bed Mobility: Sit to Supine       Sit to supine: Min assist   General bed mobility comments: assist L LE up onto bed  Transfers Overall transfer level: Needs assistance Equipment used: Rolling walker (2 wheeled) Transfers: Sit to/from Stand Sit to Stand: Min assist         General transfer comment: 50% verbal cues for hand/LE placement and safety with turns  Ambulation/Gait Ambulation/Gait assistance: Mod assist;Min assist Ambulation Distance (Feet): 8 Feet Assistive device: Rolling walker (2 wheeled) Gait Pattern/deviations: Step-to pattern;Antalgic Gait velocity: decreased   General Gait Details: very unsteady shaky gait.  When O asked why(pain/fatigue) pt stated she was nervous.  Amb a limited distance after amb  to bathroom.    Stairs            Wheelchair Mobility    Modified Rankin (Stroke Patients Only)       Balance                                            Cognition Arousal/Alertness: Awake/alert Behavior During Therapy: WFL for tasks assessed/performed Overall Cognitive Status: Within Functional Limits for tasks assessed                                 General Comments: mild anxiety/worry      Exercises      General Comments        Pertinent Vitals/Pain Pain Assessment: 0-10 Pain Score: 4  Pain Descriptors / Indicators: Operative site guarding;Sore Pain Intervention(s): Monitored during session;Repositioned;Ice applied    Home Living                      Prior Function            PT Goals (current goals can now be found in the care plan section) Progress towards PT goals: Progressing toward goals    Frequency    7X/week      PT Plan Current plan remains appropriate    Co-evaluation  End of Session Equipment Utilized During Treatment: Left knee immobilizer Activity Tolerance: Patient limited by fatigue Patient left: in bed;with bed alarm set;with call bell/phone within reach Nurse Communication: Mobility status (pt not moving as well as expected) PT Visit Diagnosis: Difficulty in walking, not elsewhere classified (R26.2);Pain     Time: 1610-9604 PT Time Calculation (min) (ACUTE ONLY): 25 min  Charges:  $Gait Training: 8-22 mins $Therapeutic Activity: 8-22 mins                    G Codes:       Felecia Shelling  PTA [facility]  Acute  Rehab Pager      [phone]

## 2016-09-22 LAB — CBC
HCT: 29.8 % — ABNORMAL LOW (ref 36.0–46.0)
Hemoglobin: 9.8 g/dL — ABNORMAL LOW (ref 12.0–15.0)
MCH: 30.2 pg (ref 26.0–34.0)
MCHC: 32.9 g/dL (ref 30.0–36.0)
MCV: 92 fL (ref 78.0–100.0)
Platelets: 220 10*3/uL (ref 150–400)
RBC: 3.24 MIL/uL — ABNORMAL LOW (ref 3.87–5.11)
RDW: 14.1 % (ref 11.5–15.5)
WBC: 10.5 10*3/uL (ref 4.0–10.5)

## 2016-09-22 NOTE — Progress Notes (Signed)
   Subjective: 3 Days Post-Op Procedure(s) (LRB): LEFT TOTAL KNEE ARTHROPLASTY (Left) Patient reports pain as mild.   Patient seen in rounds by Dr. Lequita Halt. Patient is well, but has had some minor complaints of pain in the knee, requiring pain medications Patient is ready to go home today following therapy  Objective: Vital signs in last 24 hours: Temp:  [97.7 F (36.5 C)-98.9 F (37.2 C)] 98.5 F (36.9 C) (04/19 0508) Pulse Rate:  [79-84] 83 (04/19 0508) Resp:  [15-16] 16 (04/19 0508) BP: (133-169)/(59-65) 133/59 (04/19 0508) SpO2:  [95 %-96 %] 96 % (04/19 0508)  Intake/Output from previous day:  Intake/Output Summary (Last 24 hours) at 09/22/16 0735 Last data filed at 09/22/16 0508  Gross per 24 hour  Intake              780 ml  Output              250 ml  Net              530 ml    Intake/Output this shift: No intake/output data recorded.  Labs:  Recent Labs  09/20/16 0511 09/21/16 0439 09/22/16 0445  HGB 10.6* 10.8* 9.8*    Recent Labs  09/21/16 0439 09/22/16 0445  WBC 15.9* 10.5  RBC 3.61* 3.24*  HCT 32.9* 29.8*  PLT 252 220    Recent Labs  09/20/16 0511 09/21/16 0439  NA 135 137  K 3.5 3.1*  CL 102 102  CO2 26 26  BUN 11 12  CREATININE 0.68 0.67  GLUCOSE 126* 119*  CALCIUM 8.2* 8.6*   No results for input(s): LABPT, INR in the last 72 hours.  EXAM: General - Patient is Alert, Appropriate and Oriented Extremity - Neurovascular intact Sensation intact distally Intact pulses distally Dorsiflexion/Plantar flexion intact Incision - clean, dry, no drainage Motor Function - intact, moving foot and toes well on exam.   Assessment/Plan: 3 Days Post-Op Procedure(s) (LRB): LEFT TOTAL KNEE ARTHROPLASTY (Left) Procedure(s) (LRB): LEFT TOTAL KNEE ARTHROPLASTY (Left) Past Medical History:  Diagnosis Date  . Anxiety   . Arthritis   . High cholesterol   . Hypertension    Principal Problem:   OA (osteoarthritis) of knee  Estimated body  mass index is 28.82 kg/m as calculated from the following:   Height as of this encounter:  (1.702 m).   Weight as of this encounter: 83.5 kg (184 lb). Up with therapy Discharge home with home health Diet - Cardiac diet Follow up - in 2 weeks Activity - WBAT Disposition - Home Condition Upon Discharge - Good D/C Meds - See DC Summary DVT Prophylaxis - Xarelto  Avel Peace, PA-C Orthopaedic Surgery 09/22/2016, 7:35 AM

## 2016-09-22 NOTE — Progress Notes (Signed)
Physical Therapy Treatment Patient Details Name: [name] MRN: [id number] DOB: 1940/06/20 Today's Date: 09/22/2016    History of Present Illness s/p L TKA    PT Comments    POD # 3 am session Assisted OOB to amb in hallway.  C/O 8/10 knee pain.  Will need to allow pt to rest and ICE knee then return to perform/educate on TKR TE's.   Follow Up Recommendations  Home health PT;Supervision/Assistance - 24 hour     Equipment Recommendations  None recommended by PT    Recommendations for Other Services       Precautions / Restrictions Precautions Precautions: Fall;Knee Precaution Comments: D/C KI Restrictions Weight Bearing Restrictions: No Other Position/Activity Restrictions: WBAT LLE     Mobility  Bed Mobility Overal bed mobility: Needs Assistance Bed Mobility: Supine to Sit     Supine to sit: Supervision;Min guard     General bed mobility comments: increased time and assist with L LE  Transfers Overall transfer level: Needs assistance Equipment used: Rolling walker (2 wheeled) Transfers: Sit to/from Stand Sit to Stand: Min guard;Supervision         General transfer comment: 25% VC's on proper hand placement and to extend L LE prior to sit/stand  Ambulation/Gait Ambulation/Gait assistance: Min guard;Min assist Ambulation Distance (Feet): 22 Feet Assistive device: Rolling walker (2 wheeled) Gait Pattern/deviations: Step-to pattern;Antalgic Gait velocity: decreased   General Gait Details: assisted with amb an increased distance.  Slow but steady gait.     Stairs Stairs:  (no stairs to enter)          Engineer, drilling Rankin (Stroke Patients Only)       Balance                                            Cognition Arousal/Alertness: Awake/alert Behavior During Therapy: WFL for tasks assessed/performed Overall Cognitive Status: Within Functional Limits for tasks assessed                                         Exercises      General Comments        Pertinent Vitals/Pain Pain Assessment: 0-10 Pain Score: 4  Pain Location: Left knee  Pain Descriptors / Indicators: Sore;Operative site guarding;Grimacing Pain Intervention(s): Repositioned;Monitored during session;Ice applied    Home Living                      Prior Function            PT Goals (current goals can now be found in the care plan section) Progress towards PT goals: Progressing toward goals    Frequency    7X/week      PT Plan Current plan remains appropriate    Co-evaluation             End of Session Equipment Utilized During Treatment: Gait belt Activity Tolerance: Patient tolerated treatment well Patient left: in chair;with call bell/phone within reach Nurse Communication: Mobility status PT Visit Diagnosis: Difficulty in walking, not elsewhere classified (R26.2);Pain     Time: 0920-0940 PT Time Calculation (min) (ACUTE ONLY): 20 min  Charges:  $Gait Training: 8-22 mins  G Codes:       Rica Koyanagi  PTA [facility]  Acute  Rehab Pager      [phone]

## 2016-09-22 NOTE — Progress Notes (Signed)
LATE ENTRY NOTE Date of Service of Visit - 09/21/2016    Subjective: 2 Days Post-Op Procedure(s) (LRB): LEFT TOTAL KNEE ARTHROPLASTY (Left) Patient reports pain as moderate.   Patient seen in rounds for Dr. Lequita Halt. Patient is well, but has had some minor complaints of pain in the knee, requiring pain medications Plan is to go Home after hospital stay.  Objective: Vital signs in last 24 hours: Temp:  [97.7 F (36.5 C)-98.9 F (37.2 C)] 98.5 F (36.9 C) (04/19 0508) Pulse Rate:  [79-84] 83 (04/19 0508) Resp:  [15-16] 16 (04/19 0508) BP: (133-169)/(59-65) 133/59 (04/19 0508) SpO2:  [95 %-96 %] 96 % (04/19 0508)  Intake/Output from previous day:  Intake/Output Summary (Last 24 hours) at 09/22/16 0734 Last data filed at 09/22/16 0508  Gross per 24 hour  Intake              780 ml  Output              250 ml  Net              530 ml    Intake/Output this shift: No intake/output data recorded.  Labs:  Recent Labs  09/20/16 0511 09/21/16 0439   HGB 10.6* 10.8*     Recent Labs  09/21/16 0439   WBC 15.9*   RBC 3.61*   HCT 32.9*   PLT 252     Recent Labs  09/20/16 0511   NA 135   K 3.5   CL 102   CO2 26   BUN 11   CREATININE 0.68   GLUCOSE 126*   CALCIUM 8.2*    No results for input(s): LABPT, INR in the last 72 hours.  EXAM General - Patient is Alert and Appropriate Extremity - Neurovascular intact Sensation intact distally Intact pulses distally Dressing/Incision - clean, dry, no drainage Motor Function - intact, moving foot and toes well on exam.   Past Medical History:  Diagnosis Date  . Anxiety   . Arthritis   . High cholesterol   . Hypertension     Assessment/Plan: 2 Days Post-Op Procedure(s) (LRB): LEFT TOTAL KNEE ARTHROPLASTY (Left) Principal Problem:   OA (osteoarthritis) of knee  Estimated body mass index is 28.82 kg/m as calculated from the following:   Height as of this encounter:  (1.702 m).   Weight as of this  encounter: 83.5 kg (184 lb). Up with therapy Plan for discharge tomorrow  DVT Prophylaxis - Xarelto Weight-Bearing as tolerated to left leg  Avel Peace, PA-C Orthopaedic Surgery 09/22/2016, 7:34 AM

## 2016-09-22 NOTE — Progress Notes (Signed)
Patient was given paper work and explained discharge, acknowledge learning  From read back. Patient was taken down in wheelchair as family was waiting for her.

## 2016-09-22 NOTE — Progress Notes (Addendum)
Physical Therapy Treatment Patient Details Name: [name] MRN: [id number] DOB: November 11, 1940 Today's Date: 09/22/2016    History of Present Illness s/p L TKA    PT Comments    POD # 3 Performed and educated on all TKR TE's following HEP handout.  Instructed on proper tech, freq as well as use of ICE.  L Knee extension lacking approx 15 degrees.  Instructed on importance of knee presses.  Knee flex is approz 55 degrees.  Instructed on how to use a long bed sheet to assist with heel slides(knee flex)  All questions addressed. Pt plans to D/C to her home with a "friend" to help.    Follow Up Recommendations  Home health PT;Supervision/Assistance - 24 hour     Equipment Recommendations  None recommended by PT    Recommendations for Other Services       Precautions / Restrictions Precautions Precautions: Fall;Knee Precaution Comments: D/C KI Restrictions Weight Bearing Restrictions: No Other Position/Activity Restrictions: WBAT LLE        Balance                                            Cognition Arousal/Alertness: Awake/alert Behavior During Therapy: WFL for tasks assessed/performed Overall Cognitive Status: Within Functional Limits for tasks assessed                                        Exercises   Total Knee Replacement TE's 10 reps B LE ankle pumps 10 reps towel squeezes 10 reps knee presses 10 reps heel slides  10 reps SAQ's 10 reps SLR's 10 reps ABD Followed by ICE    General Comments        Pertinent Vitals/Pain Pain Assessment: 0-10 Pain Score: 4  Pain Location: Left knee  Pain Descriptors / Indicators: Sore;Operative site guarding;Grimacing Pain Intervention(s): Repositioned;Monitored during session;Ice applied    Home Living                      Prior Function            PT Goals (current goals can now be found in the care plan section) Progress towards PT goals: Progressing toward goals     Frequency    7X/week      PT Plan Current plan remains appropriate    Co-evaluation             End of Session Equipment Utilized During Treatment: Gait belt Activity Tolerance: Patient tolerated treatment well Patient left: in chair;with call bell/phone within reach Nurse Communication: Mobility status PT Visit Diagnosis: Difficulty in walking, not elsewhere classified (R26.2);Pain     Time: 4098-1191 PT Time Calculation (min) (ACUTE ONLY): 24 min  Charges:   $Therapeutic Exercise: 23-37 mins                    G Codes:       Felecia Shelling  PTA [facility]  Acute  Rehab Pager      [phone]

## 2016-09-22 NOTE — Progress Notes (Signed)
Occupational Therapy Treatment Patient Details Name: [name] MRN: [id number] DOB: 1940/08/30 Today's Date: 09/22/2016    History of present illness s/p L TKA   OT comments  Pt is a 76 y/o F who presents with the above. Pt is progressing towards goals, able to tolerate treatment session with frequent rest breaks. Reviewed toilet and shower transfers this session in addition to completing ADLs. Pt has high toilets with grab bars at home but also reports she has 3:1 in storage. Encouraged Pt to have friend put 3:1 over her toilet to increase safety/stability during toilet transfers. Educated Pt on safety during ADL completion and during functional mobility transfers. Continue per POC.    Follow Up Recommendations  Supervision/Assistance - 24 hour    Equipment Recommendations  None recommended by OT          Precautions / Restrictions Precautions Precautions: Fall;Knee Required Braces or Orthoses: Knee Immobilizer - Left Restrictions Other Position/Activity Restrictions: WBAT LLE        Mobility Bed Mobility Overal bed mobility: Needs Assistance Bed Mobility: Sit to Supine     Supine to sit: Min guard     General bed mobility comments: increased time to complete; requires seated rest break after bed mobility prior to completing functional mobility  Transfers Overall transfer level: Needs assistance Equipment used: Rolling walker (2 wheeled) Transfers: Sit to/from Stand Sit to Stand: Min assist         General transfer comment: assist to steady    Balance Overall balance assessment: Needs assistance Sitting-balance support: Single extremity supported Sitting balance-Leahy Scale: Fair     Standing balance support: Bilateral upper extremity supported;During functional activity Standing balance-Leahy Scale: Fair                             ADL either performed or assessed with clinical judgement   ADL Overall ADL's : Needs  assistance/impaired Eating/Feeding: Sitting;Independent               Upper Body Dressing : Min guard;Sitting   Lower Body Dressing: Moderate assistance;Sit to/from stand Lower Body Dressing Details (indicate cue type and reason): Educated Pt on using reacher to assist with LB dressing Toilet Transfer: Minimal assistance;Ambulation;Comfort height toilet;Grab bars;RW       Tub/ Shower Transfer: Walk-in shower;Minimal assistance;Ambulation;Rolling walker Tub/Shower Transfer Details (indicate cue type and reason): Educated Pt on proper transfer technique to walk in shower with Pt demonstrating; handout issued reviewing sequencing of shower transfer Functional mobility during ADLs: Min guard;Rolling walker General ADL Comments: Pt completed room level functional mobility to complete toilet transfer, shower transfer, and ADL tasks. Pt completed shower transfer with MinA and verbal cues, educated Pt on having someone with her initially when completing this transfer at home, with Pt in agreement, reports she will initially wash up while sitting at sink. Pt requires frequent rest breaks in between ADL tasks due to increased fatigue/weakness; reviewed knee precautions                       Cognition Arousal/Alertness: Awake/alert Behavior During Therapy: WFL for tasks assessed/performed Overall Cognitive Status: Within Functional Limits for tasks assessed  General Comments      Pertinent Vitals/ Pain       Pain Assessment: Faces Faces Pain Scale: Hurts a little bit Pain Location: Left knee  Pain Descriptors / Indicators: Sore Pain Intervention(s): Limited activity within patient's tolerance;Monitored during session;Premedicated before session;Ice applied  Home Living                                                        Frequency  Min 2X/week        Progress Toward  Goals  OT Goals(current goals can now be found in the care plan section)  Progress towards OT goals: Progressing toward goals  Acute Rehab OT Goals Patient Stated Goal: to be independent with ADLs OT Goal Formulation: With patient Time For Goal Achievement: 09/27/16 Potential to Achieve Goals: Good  Plan Discharge plan remains appropriate                     End of Session Equipment Utilized During Treatment: Gait belt;Rolling walker;Left knee immobilizer  OT Visit Diagnosis: Muscle weakness (generalized) (M62.81);Unsteadiness on feet (R26.81)   Activity Tolerance Patient tolerated treatment well   Patient Left in chair;with call bell/phone within reach;with chair alarm set   Nurse Communication Other (comment) (education completed regarding safety at home after discharge)        Time: 0820-0855 OT Time Calculation (min): 35 min  Charges: OT General Charges $OT Visit: 1 Procedure OT Treatments $Self Care/Home Management : 23-37 mins  Marcy Siren, OT Pager [phone] 09/22/2016    [name] 09/22/2016, 10:06 AM

## 2016-11-04 NOTE — Addendum Note (Signed)
Addendum  created 11/04/16 1253 by Graceson Nichelson D, MD   Sign clinical note    

## 2016-11-14 ENCOUNTER — Other Ambulatory Visit: Payer: Self-pay | Admitting: Neurological Surgery

## 2016-11-14 DIAGNOSIS — M5417 Radiculopathy, lumbosacral region: Secondary | ICD-10-CM

## 2016-11-17 ENCOUNTER — Other Ambulatory Visit: Payer: Self-pay | Admitting: Neurological Surgery

## 2016-11-17 ENCOUNTER — Ambulatory Visit
Admission: RE | Admit: 2016-11-17 | Discharge: 2016-11-17 | Disposition: A | Discharge status: Home / Self Care | Payer: Medicare Other | Source: Ambulatory Visit | Attending: Neurological Surgery | Admitting: Neurological Surgery

## 2016-11-17 DIAGNOSIS — M5417 Radiculopathy, lumbosacral region: Secondary | ICD-10-CM

## 2016-11-17 MED ORDER — IOPAMIDOL (ISOVUE-M 200) INJECTION 41%
1.0000 mL | Freq: Once | INTRAMUSCULAR | Status: AC
  Administered 2016-11-17: 1 mL via EPIDURAL

## 2016-11-17 MED ORDER — METHYLPREDNISOLONE ACETATE 40 MG/ML INJ SUSP (RADIOLOG
120.0000 mg | Freq: Once | INTRAMUSCULAR | Status: AC
  Administered 2016-11-17: 120 mg via EPIDURAL

## 2016-11-17 NOTE — Discharge Instructions (Signed)

## 2016-11-23 ENCOUNTER — Other Ambulatory Visit: Payer: Self-pay | Admitting: Internal Medicine

## 2016-11-23 DIAGNOSIS — Z1231 Encounter for screening mammogram for malignant neoplasm of breast: Secondary | ICD-10-CM

## 2016-11-25 ENCOUNTER — Ambulatory Visit
Admission: RE | Admit: 2016-11-25 | Discharge: 2016-11-25 | Disposition: A | Discharge status: Home / Self Care | Payer: Medicare Other | Source: Ambulatory Visit | Attending: Internal Medicine | Admitting: Internal Medicine

## 2016-11-25 DIAGNOSIS — Z1231 Encounter for screening mammogram for malignant neoplasm of breast: Secondary | ICD-10-CM

## 2017-02-11 ENCOUNTER — Other Ambulatory Visit: Payer: Self-pay | Admitting: Gynecology

## 2017-02-12 ENCOUNTER — Other Ambulatory Visit: Payer: Self-pay | Admitting: Gynecology

## 2017-03-16 ENCOUNTER — Encounter: Payer: Self-pay | Admitting: Gynecology

## 2017-03-16 ENCOUNTER — Ambulatory Visit (INDEPENDENT_AMBULATORY_CARE_PROVIDER_SITE_OTHER): Payer: Medicare Other | Admitting: Gynecology

## 2017-03-16 VITALS — BP 118/76 | Ht 67.0 in | Wt 174.0 lb

## 2017-03-16 DIAGNOSIS — Z01411 Encounter for gynecological examination (general) (routine) with abnormal findings: Secondary | ICD-10-CM | POA: Diagnosis not present

## 2017-03-16 DIAGNOSIS — N952 Postmenopausal atrophic vaginitis: Secondary | ICD-10-CM | POA: Diagnosis not present

## 2017-03-16 DIAGNOSIS — L900 Lichen sclerosus et atrophicus: Secondary | ICD-10-CM

## 2017-03-16 DIAGNOSIS — N750 Cyst of Bartholin's gland: Secondary | ICD-10-CM | POA: Diagnosis not present

## 2017-03-16 DIAGNOSIS — Z79890 Hormone replacement therapy (postmenopausal): Secondary | ICD-10-CM

## 2017-03-16 MED ORDER — PROGESTERONE MICRONIZED 100 MG PO CAPS: 100.0000 mg | ORAL_CAPSULE | Freq: Every day | ORAL | 12 refills | Status: DC

## 2017-03-16 MED ORDER — ESTRADIOL 0.5 MG PO TABS: 0.5000 mg | ORAL_TABLET | Freq: Every day | ORAL | 12 refills | Status: DC

## 2017-03-16 NOTE — Patient Instructions (Signed)
Follow up in one year, sooner as needed. 

## 2017-03-16 NOTE — Progress Notes (Signed)
    [name] 1940-06-21 [id number]        76 y.o.  G0P0 for annual gynecologic exam.    Past medical history,surgical history, problem list, medications, allergies, family history and social history were all reviewed and documented as reviewed in the EPIC chart.  ROS:  Performed with pertinent positives and negatives included in the history, assessment and plan.   Additional significant findings :  None   Exam: [name] assistant Vitals:   03/16/17 0955  BP: 118/76  Weight: 174 lb (78.9 kg)  Height:  (1.702 m)   Body mass index is 27.25 kg/m.  General appearance:  Normal affect, orientation and appearance. Skin: Grossly normal HEENT: Without gross lesions.  No cervical or supraclavicular adenopathy. Thyroid normal.  Lungs:  Clear without wheezing, rales or rhonchi Cardiac: RR, without RMG Abdominal:  Soft, nontender, without masses, guarding, rebound, organomegaly or hernia Breasts:  Examined lying and sitting without masses, retractions, discharge or axillary adenopathy. Pelvic:  Ext, BUS, Vagina: With atrophic changes. No significant changes attributable to lichen sclerosis. Small left Bartholin's cyst.  Cervix: With atrophic changes  Uterus: Difficult to palpate but no gross masses or tenderness  Adnexa: Without masses or tenderness    Anus and perineum: Normal   Rectovaginal: Normal sphincter tone without palpated masses or tenderness.    Assessment/Plan:  76 y.o. G0P0 female for annual gynecologic exam.   1. Postmenopausal/atrophic genital changes/HRT. Continues on estradiol 0.5 mg 3 days weekly and Prometrium 100 mg nightly. Feels better when she takes this. I again reviewed the issues and risks to include thrombosis such as stroke heart attack DVT and possible increased risk with age as well as breast cancer issue. Benefits as far as symptom relief and cardiovascular/bone health also reviewed. At this point the patient feels it is a all of life issue and  she understands and accepts the risks and I refilled 1 year. 2. Lichen sclerosis. Without significant changes on exam. She does use Temovate 0.05% cream intermittently as needed. She has a supply at home but will call if she needs more. 3. Left Bartholin's cyst. Noted on MRI last year. Remains stable on exam. Does have a history of Bartholin abscess that was lanced years ago. Options for excision versus observation reviewed and she is comfortable to continue to follow. If it changes as far as size or tenderness. Follow up with me. 4. Mammography 11/2016. Continue with annual mammography when due. Rest exam normal today. 5. DEXA 2013. Patient plans to have repeated this year when she sees her primary physician who follows her for her bone health. 6. Colonoscopy 2013. Repeat at their recommended interval. 7. Pap smear 2012. No Pap smear done today. No history of abnormal Pap smears. Per current screening guidelines we both agree to stop screening based on age. 8. Health maintenance. No routine lab work done as patient does this elsewhere. Follow up 1 year, sooner as needed.   [name] MD, 10:44 AM 03/16/2017

## 2017-03-21 ENCOUNTER — Other Ambulatory Visit: Payer: Self-pay | Admitting: Neurological Surgery

## 2017-03-21 DIAGNOSIS — M5417 Radiculopathy, lumbosacral region: Secondary | ICD-10-CM

## 2017-04-06 DIAGNOSIS — N751 Abscess of Bartholin's gland: Secondary | ICD-10-CM

## 2017-04-06 HISTORY — DX: Abscess of Bartholin's gland: N75.1

## 2017-04-17 ENCOUNTER — Ambulatory Visit: Payer: Medicare Other | Admitting: Gynecology

## 2017-04-17 ENCOUNTER — Encounter: Payer: Self-pay | Admitting: Gynecology

## 2017-04-17 VITALS — BP 130/70

## 2017-04-17 DIAGNOSIS — N751 Abscess of Bartholin's gland: Secondary | ICD-10-CM

## 2017-04-17 DIAGNOSIS — N3000 Acute cystitis without hematuria: Secondary | ICD-10-CM | POA: Diagnosis not present

## 2017-04-17 LAB — TIQ-NTM

## 2017-04-17 MED ORDER — DOXYCYCLINE HYCLATE 100 MG PO CAPS: 100.0000 mg | ORAL_CAPSULE | Freq: Two times a day (BID) | ORAL | 0 refills | Status: DC

## 2017-04-17 NOTE — Progress Notes (Signed)
    Kristen MontgomeryMartha E Blake 10/28/1940 [id number]        76 y.o.  G0P0 presents with several days of worsening left labial pain now with difficulty sitting.  Also with frequency and dysuria.  No urgency, low back pain, fever or chills.  No vaginal drainage.  History of left Bartholin cyst.  No nausea vomiting diarrhea constipation.  Past medical history,surgical history, problem list, medications, allergies, family history and social history were all reviewed and documented in the EPIC chart.  Directed ROS with pertinent positives and negatives documented in the history of present illness/assessment and plan.  Exam: Kennon PortelaKim Gardner assistant Vitals:   04/17/17 1020  BP: 130/70   General appearance:  Normal Abdomen soft nontender without masses guarding rebound Pelvic external BUS vagina with atrophic changes.  4 cm tense cystic mass left Bartholin region, tender to palpation consistent with Bartholin gland abscess.  No evidence of cellulitis or inguinal adenopathy/tenderness.  Cervix flush with upper vagina.  Uterus difficult to palpate but no gross masses or tenderness. Physical Exam  Genitourinary:        Procedure: The skin overlying the thinnest most pointing area, mid cyst at the hymenal ring region, was cleansed with Betadine and infiltrated with 1% lidocaine.  A stab incision was made with a scalpel and frank purulent material extruded.  A culture was taken.  The cyst was completely emptied and the small opening probed.  Silver nitrate was applied to a small area of the skin opening that was bleeding to achieve hemostasis.  Assessment/Plan:  76 y.o. G0P0 with:  1. Left Bartholin gland abscess.  Opened and drained.  Did not pack it at this time.  Discussed postoperative instructions to include sitz baths with warm compresses.  Will initiate antibiotics to cover with doxycycline 100 mg twice daily times 10 days.  Assuming all of her symptoms resolve of asked her to return in 2 months to  reinspect this area to see if there is any residual Bartholin gland cyst or nodularity.  She does give a history of recurrent issues with Bartholin gland on this side to include drainage of abscesses in the past by Dr. Elana AlmMcPhail.  Possibilities for marsupialization discussed if persistent abnormality.  ASAP call precautions to include increasing pain, redevelopment of mass, fever or chills discussed. 2. UTI.  Urine analysis consistent with UTI.  Does show 10-20 squamous cells and questionable contaminant but given her symptoms and difficulty for her to get a good clean-catch will go ahead and cover her with the Vibramycin as above.  We will culture for completeness and antibiotic susceptibility.    Dara LordsFONTAINE,Tehya Leath P MD, 10:57 AM 04/17/2017

## 2017-04-17 NOTE — Addendum Note (Signed)
Addended by: Dayna BarkerGARDNER, Skie Vitrano K on: 04/17/2017 12:47 PM   Modules accepted: Orders

## 2017-04-17 NOTE — Patient Instructions (Signed)
Take the antibiotic twice daily for 10 days  Follow-up if the pain persists, worsens or you develop other symptoms such as fever or chills.  Follow-up in 2 months for reexamination

## 2017-04-17 NOTE — Addendum Note (Signed)
Addended by: Dayna BarkerGARDNER, Larraine Argo K on: 04/17/2017 11:16 AM   Modules accepted: Orders

## 2017-04-18 LAB — WOUND CULTURE

## 2017-04-19 ENCOUNTER — Ambulatory Visit
Admission: RE | Admit: 2017-04-19 | Discharge: 2017-04-19 | Disposition: A | Discharge status: Home / Self Care | Payer: Medicare Other | Source: Ambulatory Visit | Attending: Neurological Surgery | Admitting: Neurological Surgery

## 2017-04-19 DIAGNOSIS — M5417 Radiculopathy, lumbosacral region: Secondary | ICD-10-CM

## 2017-04-19 MED ORDER — METHYLPREDNISOLONE ACETATE 40 MG/ML INJ SUSP (RADIOLOG
120.0000 mg | Freq: Once | INTRAMUSCULAR | Status: AC
  Administered 2017-04-19: 120 mg via EPIDURAL

## 2017-04-19 MED ORDER — IOPAMIDOL (ISOVUE-M 200) INJECTION 41%
1.0000 mL | Freq: Once | INTRAMUSCULAR | Status: AC
  Administered 2017-04-19: 1 mL via EPIDURAL

## 2017-04-20 LAB — URINALYSIS W MICROSCOPIC + REFLEX CULTURE
Bilirubin Urine: NEGATIVE
Glucose, UA: NEGATIVE
Hyaline Cast: NONE SEEN /LPF
Ketones, ur: NEGATIVE
Nitrites, Initial: NEGATIVE
Specific Gravity, Urine: 1.015 (ref 1.001–1.03)
WBC, UA: >=60 /HPF — AB (ref 0–5)
pH: 6 (ref 5.0–8.0)

## 2017-04-20 LAB — URINE CULTURE
MICRO NUMBER:: 81277261
SPECIMEN QUALITY:: ADEQUATE

## 2017-04-20 LAB — CULTURE INDICATED

## 2017-04-24 ENCOUNTER — Telehealth: Payer: Self-pay | Admitting: *Deleted

## 2017-04-24 NOTE — Telephone Encounter (Signed)
Office visit this week for quick exam just to make sure nothing needs to be done now before long weekend.  Otherwise we can schedule her for a marsupialization over the next couple weeks.

## 2017-04-24 NOTE — Telephone Encounter (Signed)
Pt informed

## 2017-04-24 NOTE — Telephone Encounter (Signed)
Pt called to follow up from OV on 04/17/17 states bartholin abscess has returned, not as painful. Pt said you discussed outpatient surgery if continues, pt said she would like to proceed with this as this would be the 3rd abscess. Please advise

## 2017-04-26 ENCOUNTER — Ambulatory Visit: Payer: Medicare Other | Admitting: Gynecology

## 2017-04-26 ENCOUNTER — Encounter: Payer: Self-pay | Admitting: Gynecology

## 2017-04-26 VITALS — BP 120/76

## 2017-04-26 DIAGNOSIS — N751 Abscess of Bartholin's gland: Secondary | ICD-10-CM

## 2017-04-26 MED ORDER — DOXYCYCLINE HYCLATE 100 MG PO CAPS: 100.0000 mg | ORAL_CAPSULE | Freq: Two times a day (BID) | ORAL | 0 refills | Status: DC

## 2017-04-26 NOTE — Patient Instructions (Signed)
Take the oral antibiotics twice daily for the next 10 days.  Warm sits baths to address the Bartholin abscess.  Call if this continues to be an issue.

## 2017-04-26 NOTE — Progress Notes (Signed)
    Kristen MontgomeryMartha E Blake 01/02/1941 [id number]        76 y.o.  G0P0 presents having recently had a left Bartholin cyst abscess drained.  She had called noting some reaccumulation and wondered about proceeding with surgery I asked her to come in to reexamine her.  On history she notes that she is much improved from the initial abscess.  She notes some coming and going of drainage but not having any significant discomfort or other symptoms.  No fever or chills.  Is taking her Vibramycin but admits to not taking it consistently.  Past medical history,surgical history, problem list, medications, allergies, family history and social history were all reviewed and documented in the EPIC chart.  Directed ROS with pertinent positives and negatives documented in the history of present illness/assessment and plan.  Exam: Kennon PortelaKim Gardner assistant Vitals:   04/26/17 1146  BP: 120/76   General appearance:  Normal External BUS vagina with small left Bartholin cystic area with open I&D prior incision site.  No frank drainage.  No significant tenderness to palpation.  No surrounding cellulitis or inguinal adenopathy.  Assessment/Plan:  76 y.o. G0P0 with improved left Bartholin abscess drainage area.  Small cystic area on palpation but much better than previously.  Not tender or actively draining at this time.  I recommended that she continue to monitor, sits baths and to continue her Vibramycin for now.  I re-ordered a full 10-day course and stressed the need to take this twice daily on a consistent basis.  Ultimate options for surgery to include marsupialization discussed.  She is not interested in proceeding with anything at this time but prefers to wait until January.  She will reassess her situation then and follow-up if it continues to be an issue with me.  She will follow-up sooner if significant reaccumulation, tenderness or any signs of more involved infection.  ASAP call precautions were reviewed with  her.  Greater than 50% of my time was spent in direct face to face counseling and coordination of care with the patient.     Dara Lordsimothy P Hilmer Aliberti MD, 12:07 PM 04/26/2017

## 2017-06-22 ENCOUNTER — Ambulatory Visit: Payer: Medicare Other | Admitting: Gynecology

## 2017-06-28 ENCOUNTER — Encounter: Payer: Self-pay | Admitting: Gynecology

## 2017-06-28 ENCOUNTER — Ambulatory Visit (INDEPENDENT_AMBULATORY_CARE_PROVIDER_SITE_OTHER): Payer: Medicare Other | Admitting: Gynecology

## 2017-06-28 VITALS — BP 124/80

## 2017-06-28 DIAGNOSIS — N750 Cyst of Bartholin's gland: Secondary | ICD-10-CM

## 2017-06-28 NOTE — Patient Instructions (Addendum)
Follow-up if any gynecologic issues. 

## 2017-06-28 NOTE — Progress Notes (Signed)
    Enzo MontgomeryMartha E Sattler 03/09/1941 [id number]        77 y.o.  G0P0 presents in follow-up of left Bartholin cyst abscess that drained previously.  Had been treated with antibiotics and her symptoms have resolved.  I asked her to return in a nonacute situation for a baseline exam.  Past medical history,surgical history, problem list, medications, allergies, family history and social history were all reviewed and documented in the EPIC chart.  Directed ROS with pertinent positives and negatives documented in the history of present illness/assessment and plan.  Exam: Kennon PortelaKim Gardner assistant Vitals:   06/28/17 1154  BP: 124/80   General appearance:  Normal External BUS vagina with atrophic changes.  Left Bartholin gland area is normal to exam.  There is no residual cyst/mass or any abnormality.    Assessment/Plan:  77 y.o. G0P0 with resolved Bartholin gland abscess.  This is happened several times over the years with last episode several years ago.  No residual abnormalities on exam.  Will monitor and follow.  Represent if any recurrence.    Dara Lordsimothy P Sebastian Lurz MD, 12:11 PM 06/28/2017

## 2017-07-04 ENCOUNTER — Other Ambulatory Visit: Payer: Self-pay | Admitting: *Deleted

## 2017-07-04 MED ORDER — ESTRADIOL 0.5 MG PO TABS: 0.5000 mg | ORAL_TABLET | Freq: Every day | ORAL | 2 refills | Status: DC

## 2017-08-30 ENCOUNTER — Other Ambulatory Visit: Payer: Self-pay | Admitting: Neurological Surgery

## 2017-08-30 DIAGNOSIS — M5417 Radiculopathy, lumbosacral region: Secondary | ICD-10-CM

## 2017-09-07 ENCOUNTER — Ambulatory Visit
Admission: RE | Admit: 2017-09-07 | Discharge: 2017-09-07 | Disposition: A | Discharge status: Home / Self Care | Payer: Medicare Other | Source: Ambulatory Visit | Attending: Neurological Surgery | Admitting: Neurological Surgery

## 2017-09-07 DIAGNOSIS — M5417 Radiculopathy, lumbosacral region: Secondary | ICD-10-CM

## 2017-09-07 MED ORDER — METHYLPREDNISOLONE ACETATE 40 MG/ML INJ SUSP (RADIOLOG
120.0000 mg | Freq: Once | INTRAMUSCULAR | Status: AC
  Administered 2017-09-07: 120 mg via EPIDURAL

## 2017-09-07 MED ORDER — IOPAMIDOL (ISOVUE-M 200) INJECTION 41%
1.0000 mL | Freq: Once | INTRAMUSCULAR | Status: AC
  Administered 2017-09-07: 1 mL via EPIDURAL

## 2017-09-08 DIAGNOSIS — Z96652 Presence of left artificial knee joint: Secondary | ICD-10-CM | POA: Insufficient documentation

## 2017-11-30 ENCOUNTER — Other Ambulatory Visit: Payer: Self-pay | Admitting: Neurological Surgery

## 2017-11-30 DIAGNOSIS — M5417 Radiculopathy, lumbosacral region: Secondary | ICD-10-CM

## 2017-12-14 ENCOUNTER — Ambulatory Visit
Admission: RE | Admit: 2017-12-14 | Discharge: 2017-12-14 | Disposition: A | Discharge status: Home / Self Care | Payer: Medicare Other | Source: Ambulatory Visit | Attending: Neurological Surgery | Admitting: Neurological Surgery

## 2017-12-14 DIAGNOSIS — M5417 Radiculopathy, lumbosacral region: Secondary | ICD-10-CM

## 2017-12-14 MED ORDER — METHYLPREDNISOLONE ACETATE 40 MG/ML INJ SUSP (RADIOLOG
120.0000 mg | Freq: Once | INTRAMUSCULAR | Status: AC
  Administered 2017-12-14: 120 mg via EPIDURAL

## 2017-12-14 MED ORDER — IOPAMIDOL (ISOVUE-M 200) INJECTION 41%
1.0000 mL | Freq: Once | INTRAMUSCULAR | Status: AC
  Administered 2017-12-14: 1 mL via EPIDURAL

## 2017-12-14 NOTE — Discharge Instructions (Signed)

## 2018-01-16 ENCOUNTER — Other Ambulatory Visit: Payer: Self-pay | Admitting: Internal Medicine

## 2018-01-16 DIAGNOSIS — Z1231 Encounter for screening mammogram for malignant neoplasm of breast: Secondary | ICD-10-CM

## 2018-01-19 ENCOUNTER — Ambulatory Visit: Payer: Medicare Other

## 2018-02-14 ENCOUNTER — Ambulatory Visit
Admission: RE | Admit: 2018-02-14 | Discharge: 2018-02-14 | Disposition: A | Discharge status: Home / Self Care | Payer: Medicare Other | Source: Ambulatory Visit | Attending: Internal Medicine | Admitting: Internal Medicine

## 2018-02-14 DIAGNOSIS — Z1231 Encounter for screening mammogram for malignant neoplasm of breast: Secondary | ICD-10-CM

## 2018-04-07 ENCOUNTER — Other Ambulatory Visit: Payer: Self-pay | Admitting: Gynecology

## 2018-04-09 NOTE — Telephone Encounter (Signed)
Annual exam on 04/30/18

## 2018-04-11 ENCOUNTER — Other Ambulatory Visit: Payer: Self-pay | Admitting: Neurological Surgery

## 2018-04-11 DIAGNOSIS — M5417 Radiculopathy, lumbosacral region: Secondary | ICD-10-CM

## 2018-04-23 ENCOUNTER — Ambulatory Visit
Admission: RE | Admit: 2018-04-23 | Discharge: 2018-04-23 | Disposition: A | Discharge status: Home / Self Care | Payer: Medicare Other | Source: Ambulatory Visit | Attending: Neurological Surgery | Admitting: Neurological Surgery

## 2018-04-23 DIAGNOSIS — M5417 Radiculopathy, lumbosacral region: Secondary | ICD-10-CM

## 2018-04-23 MED ORDER — GADOBENATE DIMEGLUMINE 529 MG/ML IV SOLN
16.0000 mL | Freq: Once | INTRAVENOUS | Status: AC | PRN
  Administered 2018-04-23: 16 mL via INTRAVENOUS

## 2018-04-30 ENCOUNTER — Encounter: Payer: Self-pay | Admitting: Gynecology

## 2018-04-30 ENCOUNTER — Ambulatory Visit: Payer: Medicare Other | Admitting: Gynecology

## 2018-04-30 VITALS — BP 124/80 | Ht 66.0 in | Wt 188.0 lb

## 2018-04-30 DIAGNOSIS — Z79890 Hormone replacement therapy (postmenopausal): Secondary | ICD-10-CM

## 2018-04-30 DIAGNOSIS — N952 Postmenopausal atrophic vaginitis: Secondary | ICD-10-CM | POA: Diagnosis not present

## 2018-04-30 DIAGNOSIS — Z01419 Encounter for gynecological examination (general) (routine) without abnormal findings: Secondary | ICD-10-CM

## 2018-04-30 MED ORDER — PROGESTERONE MICRONIZED 100 MG PO CAPS: 100.0000 mg | ORAL_CAPSULE | Freq: Every day | ORAL | 4 refills | Status: DC

## 2018-04-30 MED ORDER — ESTRADIOL 0.5 MG PO TABS: 0.5000 mg | ORAL_TABLET | Freq: Every day | ORAL | 4 refills | Status: DC

## 2018-04-30 MED ORDER — CLOBETASOL PROPIONATE 0.05 % EX OINT: TOPICAL_OINTMENT | Freq: Two times a day (BID) | CUTANEOUS | 1 refills | Status: DC

## 2018-04-30 NOTE — Progress Notes (Signed)
    Enzo MontgomeryMartha E Sherry 12/02/1940 [id number]        77 y.o.  G0P0 for annual gynecologic exam.  Without gynecologic complaints.  Past medical history,surgical history, problem list, medications, allergies, family history and social history were all reviewed and documented as reviewed in the EPIC chart.  ROS:  Performed with pertinent positives and negatives included in the history, assessment and plan.   Additional significant findings : None   Exam: Kennon PortelaKim Gardner assistant Vitals:   04/30/18 1137  BP: 124/80  Weight: 188 lb (85.3 kg)  Height: 5\' 6"  (1.676 m)   Body mass index is 30.34 kg/m.  General appearance:  Normal affect, orientation and appearance. Skin: Grossly normal HEENT: Without gross lesions.  No cervical or supraclavicular adenopathy. Thyroid normal.  Lungs:  Clear without wheezing, rales or rhonchi Cardiac: RR, without RMG Abdominal:  Soft, nontender, without masses, guarding, rebound, organomegaly or hernia Breasts:  Examined lying and sitting without masses, retractions, discharge or axillary adenopathy. Pelvic:  Ext, BUS, Vagina: With perivaginal symmetrical blanching of the skin consistent with diagnosis of lichen sclerosis  Cervix: With atrophic changes  Uterus: Difficult to palpate but grossly normal size, nontender  Adnexa: Without masses or tenderness    Anus and perineum: Normal   Rectovaginal: Normal sphincter tone without palpated masses or tenderness.    Assessment/Plan:  77 y.o. G0P0 female for annual gynecologic exam.   1. Postmenopausal/HRT.  Continues on estradiol 0.5 mg and Prometrium 100 mg nightly.  We again reviewed the risks versus benefits particularly in a patient in her 5570s to include stroke heart attack DVT and breast cancer.  Recommendations to wean slowly discussed.  At this point the patient wants to continue.  She continues to have some hot flushes and is concerned that she will have a significant increase in her symptoms.  She understands  the issues and risks.  Refill x1 year provided. 2. History of Bartholin cyst/abscess previously.  Exam today is normal without evidence of any cysts or masses. 3. History of lichen sclerosis.  Uses clobetasol intermittently with good results.  Refill for clobetasol 0.05% cream 30 g provided. 4. DEXA reported 2019 followed through her primary physician's office.  She will continue to follow-up with him in reference to her bone health. 5. Mammography 02/2018.  Continue with annual mammography when due.  Breast exam normal today. 6. Pap smear 2012.  No Pap smear done today.  No history of significant abnormal Pap smears.  Per current screening guidelines we both agree to stop screening based on age. 7. Colonoscopy 2017.  Repeat at their recommended interval. 8. Health maintenance.  No routine lab work done as patient does this elsewhere.  Follow-up 1 year, sooner as needed.   Dara Lordsimothy P Eiliyah Reh MD, 12:07 PM 04/30/2018

## 2018-04-30 NOTE — Patient Instructions (Signed)
Follow-up in 1 year for annual exam, sooner if any issues. 

## 2018-05-22 ENCOUNTER — Other Ambulatory Visit: Payer: Self-pay | Admitting: Neurological Surgery

## 2018-06-06 HISTORY — PX: BREAST BIOPSY: SHX20

## 2018-06-21 NOTE — Pre-Procedure Instructions (Signed)
[name]  06/21/2018      [id number] - [place], [place] - [address] AT [address] OF [facility] [address] [place] [address] Phone: [phone] Fax: [phone]    Your procedure is scheduled on June 29, 2018.  Report to [facility] Admitting at 530 AM.  Call this number if you have problems the morning of surgery:  [phone]   Remember:  Do not eat or drink after midnight.    Take these medicines the morning of surgery with A SIP OF WATER  Donepezil (aricept) Estradiol (Estrace) Sertraline (zoloft)  Follow your surgeon's instructions on when to hold/resume aspirin.  If no instructions were given call the office to determine how they would like to you take aspirin   7 days prior to surgery STOP taking any Meloxicam (mobic), Aleve, Naproxen, Ibuprofen, Motrin, Advil, Goody's, BC's, all herbal medications, fish oil, and all vitamins   Do not wear jewelry, make-up or nail polish.  Do not wear lotions, powders, or perfumes, or deodorant.  Do not shave 48 hours prior to surgery.    Do not bring valuables to the hospital.  [facility] is not responsible for any belongings or valuables.  Contacts, dentures or bridgework may not be worn into surgery.  Leave your suitcase in the car.  After surgery it may be brought to your room.  For patients admitted to the hospital, discharge time will be determined by your treatment team.  Patients discharged the day of surgery will not be allowed to drive home.    [facility]- Preparing For Surgery  Before surgery, you can play an important role. Because skin is not sterile, your skin needs to be as free of germs as possible. You can reduce the number of germs on your skin by washing with CHG (chlorahexidine gluconate) Soap before surgery.  CHG is an antiseptic cleaner which kills germs and bonds with the skin to continue killing germs even after washing.    Oral Hygiene is also  important to reduce your risk of infection.  Remember - BRUSH YOUR TEETH THE MORNING OF SURGERY WITH YOUR REGULAR TOOTHPASTE  Please do not use if you have an allergy to CHG or antibacterial soaps. If your skin becomes reddened/irritated stop using the CHG.  Do not shave (including legs and underarms) for at least 48 hours prior to first CHG shower. It is OK to shave your face.  Please follow these instructions carefully.   1. Shower the NIGHT BEFORE SURGERY and the MORNING OF SURGERY with CHG.   2. If you chose to wash your hair, wash your hair first as usual with your normal shampoo.  3. After you shampoo, rinse your hair and body thoroughly to remove the shampoo.  4. Use CHG as you would any other liquid soap. You can apply CHG directly to the skin and wash gently with a scrungie or a clean washcloth.   5. Apply the CHG Soap to your body ONLY FROM THE NECK DOWN.  Do not use on open wounds or open sores. Avoid contact with your eyes, ears, mouth and genitals (private parts). Wash Face and genitals (private parts)  with your normal soap.  6. Wash thoroughly, paying special attention to the area where your surgery will be performed.  7. Thoroughly rinse your body with warm water from the neck down.  8. DO NOT shower/wash with your normal soap after using and rinsing off the CHG Soap.  9. Pat yourself dry with a CLEAN TOWEL.  10. Wear CLEAN PAJAMAS to bed the night before surgery, wear comfortable clothes the morning of surgery  11. Place CLEAN SHEETS on your bed the night of your first shower and DO NOT SLEEP WITH PETS.  Day of Surgery:  Do not apply any deodorants/lotions.  Please wear clean clothes to the hospital/surgery center.   Remember to brush your teeth WITH YOUR REGULAR TOOTHPASTE.   Please read over the following fact sheets that you were given.

## 2018-06-22 ENCOUNTER — Encounter (HOSPITAL_COMMUNITY): Payer: Self-pay

## 2018-06-22 ENCOUNTER — Encounter (HOSPITAL_COMMUNITY)
Admission: RE | Admit: 2018-06-22 | Discharge: 2018-06-22 | Disposition: A | Discharge status: Home / Self Care | Payer: Medicare Other | Source: Ambulatory Visit | Attending: Neurological Surgery | Admitting: Neurological Surgery

## 2018-06-22 ENCOUNTER — Other Ambulatory Visit: Payer: Self-pay

## 2018-06-22 DIAGNOSIS — R9431 Abnormal electrocardiogram [ECG] [EKG]: Secondary | ICD-10-CM | POA: Diagnosis not present

## 2018-06-22 DIAGNOSIS — Z01818 Encounter for other preprocedural examination: Secondary | ICD-10-CM | POA: Insufficient documentation

## 2018-06-22 LAB — CBC
HCT: 41.1 % (ref 36.0–46.0)
Hemoglobin: 13 g/dL (ref 12.0–15.0)
MCH: 29.8 pg (ref 26.0–34.0)
MCHC: 31.6 g/dL (ref 30.0–36.0)
MCV: 94.3 fL (ref 80.0–100.0)
Platelets: 261 10*3/uL (ref 150–400)
RBC: 4.36 MIL/uL (ref 3.87–5.11)
RDW: 13.2 % (ref 11.5–15.5)
WBC: 6.1 10*3/uL (ref 4.0–10.5)
nRBC: 0 % (ref 0.0–0.2)

## 2018-06-22 LAB — BASIC METABOLIC PANEL
Anion gap: 9 (ref 5–15)
BUN: 18 mg/dL (ref 8–23)
CO2: 23 mmol/L (ref 22–32)
Calcium: 9.1 mg/dL (ref 8.9–10.3)
Chloride: 107 mmol/L (ref 98–111)
Creatinine, Ser: 0.77 mg/dL (ref 0.44–1.00)
GFR calc Af Amer: >60 mL/min (ref >60)
GFR calc non Af Amer: >60 mL/min (ref >60)
Glucose, Bld: 98 mg/dL (ref 70–99)
Potassium: 4 mmol/L (ref 3.5–5.1)
Sodium: 139 mmol/L (ref 135–145)

## 2018-06-22 LAB — TYPE AND SCREEN
ABO/RH(D): O NEG
Antibody Screen: NEGATIVE

## 2018-06-22 LAB — SURGICAL PCR SCREEN
MRSA, PCR: NEGATIVE
Staphylococcus aureus: NEGATIVE

## 2018-06-22 NOTE — Progress Notes (Addendum)
PCP - Geoffry Paradiseichard Aronson MD Cardiologist -none   Chest x-ray -none  EKG --1=17=2020  Stress Test n/a-  ECHO - n/a Cardiac Cath -n/a  Sleep Study -n/a  CPAP - n/a  Fasting Blood Sugar - n/a Checks Blood Sugar n/a_____ times a day  Blood Thinner Instructions:n/a Aspirin Instructions:hold 7 days prior to surgery  Anesthesia review: yes  Patient denies shortness of breath, fever, cough and chest pain at PAT appointment   Patient verbalized understanding of instructions that were given to them at the PAT appointment. Patient was also instructed that they will need to review over the PAT instructions again at home before surgery.

## 2018-06-28 ENCOUNTER — Encounter (HOSPITAL_COMMUNITY): Payer: Self-pay | Admitting: Anesthesiology

## 2018-06-28 NOTE — Anesthesia Preprocedure Evaluation (Addendum)
Anesthesia Evaluation  Patient identified by MRN, date of birth, ID band Patient awake    Reviewed: Allergy & Precautions, NPO status , Patient's Chart, lab work & pertinent test results  Airway Mallampati: II  TM Distance: >3 FB Neck ROM: Full    Dental no notable dental hx. (+) Teeth Intact   Pulmonary neg pulmonary ROS,    Pulmonary exam normal breath sounds clear to auscultation       Cardiovascular hypertension, Pt. on medications Normal cardiovascular exam Rhythm:Regular Rate:Normal     Neuro/Psych Anxiety negative neurological ROS     GI/Hepatic negative GI ROS, Neg liver ROS,   Endo/Other  Hyperlipidemia   Renal/GU negative Renal ROS  negative genitourinary   Musculoskeletal  (+) Arthritis , Osteoarthritis,  Lumbar stenosis   Abdominal   Peds  Hematology   Anesthesia Other Findings   Reproductive/Obstetrics                            Anesthesia Physical Anesthesia Plan  ASA: II  Anesthesia Plan: General   Post-op Pain Management:    Induction: Intravenous  PONV Risk Score and Plan: 4 or greater and Ondansetron, Dexamethasone and Treatment may vary due to age or medical condition  Airway Management Planned: Oral ETT  Additional Equipment:   Intra-op Plan:   Post-operative Plan: Extubation in OR  Informed Consent: I have reviewed the patients History and Physical, chart, labs and discussed the procedure including the risks, benefits and alternatives for the proposed anesthesia with the patient or authorized representative who has indicated his/her understanding and acceptance.     Dental advisory given  Plan Discussed with: CRNA and Surgeon  Anesthesia Plan Comments:        Anesthesia Quick Evaluation

## 2018-06-29 ENCOUNTER — Inpatient Hospital Stay (HOSPITAL_COMMUNITY)
Admission: RE | Admit: 2018-06-29 | Discharge: 2018-07-05 | DRG: 455 | Disposition: A | Discharge status: Home Health | Payer: Medicare Other | Attending: Neurological Surgery | Admitting: Neurological Surgery

## 2018-06-29 ENCOUNTER — Encounter (HOSPITAL_COMMUNITY): Payer: Self-pay | Admitting: *Deleted

## 2018-06-29 ENCOUNTER — Other Ambulatory Visit: Payer: Self-pay

## 2018-06-29 ENCOUNTER — Encounter (HOSPITAL_COMMUNITY)
Admission: RE | Disposition: A | Discharge status: Home Health | Payer: Self-pay | Source: Home / Self Care | Attending: Neurological Surgery

## 2018-06-29 ENCOUNTER — Inpatient Hospital Stay (HOSPITAL_COMMUNITY): Payer: Medicare Other | Admitting: Physician Assistant

## 2018-06-29 ENCOUNTER — Inpatient Hospital Stay (HOSPITAL_COMMUNITY): Payer: Medicare Other | Admitting: Certified Registered Nurse Anesthetist

## 2018-06-29 ENCOUNTER — Inpatient Hospital Stay (HOSPITAL_COMMUNITY): Payer: Medicare Other

## 2018-06-29 DIAGNOSIS — E7800 Pure hypercholesterolemia, unspecified: Secondary | ICD-10-CM | POA: Diagnosis present

## 2018-06-29 DIAGNOSIS — Z981 Arthrodesis status: Secondary | ICD-10-CM

## 2018-06-29 DIAGNOSIS — M4727 Other spondylosis with radiculopathy, lumbosacral region: Secondary | ICD-10-CM | POA: Diagnosis present

## 2018-06-29 DIAGNOSIS — M48061 Spinal stenosis, lumbar region without neurogenic claudication: Secondary | ICD-10-CM

## 2018-06-29 DIAGNOSIS — I10 Essential (primary) hypertension: Secondary | ICD-10-CM | POA: Diagnosis present

## 2018-06-29 DIAGNOSIS — Z96653 Presence of artificial knee joint, bilateral: Secondary | ICD-10-CM | POA: Diagnosis present

## 2018-06-29 DIAGNOSIS — Z7982 Long term (current) use of aspirin: Secondary | ICD-10-CM | POA: Diagnosis not present

## 2018-06-29 DIAGNOSIS — Z79818 Long term (current) use of other agents affecting estrogen receptors and estrogen levels: Secondary | ICD-10-CM | POA: Diagnosis not present

## 2018-06-29 DIAGNOSIS — Z79899 Other long term (current) drug therapy: Secondary | ICD-10-CM

## 2018-06-29 DIAGNOSIS — M5416 Radiculopathy, lumbar region: Secondary | ICD-10-CM | POA: Diagnosis present

## 2018-06-29 DIAGNOSIS — Z791 Long term (current) use of non-steroidal anti-inflammatories (NSAID): Secondary | ICD-10-CM | POA: Diagnosis not present

## 2018-06-29 DIAGNOSIS — Z09 Encounter for follow-up examination after completed treatment for conditions other than malignant neoplasm: Secondary | ICD-10-CM

## 2018-06-29 SURGERY — POSTERIOR LUMBAR FUSION 1 LEVEL
Anesthesia: General | Site: Back

## 2018-06-29 MED ORDER — METOCLOPRAMIDE HCL 5 MG/ML IJ SOLN
INTRAMUSCULAR | Status: AC
  Administered 2018-06-29: 10 mg via INTRAVENOUS
  Filled 2018-06-29: qty 2

## 2018-06-29 MED ORDER — METOCLOPRAMIDE HCL 5 MG/ML IJ SOLN
10.0000 mg | Freq: Once | INTRAMUSCULAR | Status: AC | PRN
  Administered 2018-06-29: 10 mg via INTRAVENOUS

## 2018-06-29 MED ORDER — FENTANYL CITRATE (PF) 250 MCG/5ML IJ SOLN
INTRAMUSCULAR | Status: AC
  Filled 2018-06-29: qty 5

## 2018-06-29 MED ORDER — METHOCARBAMOL 1000 MG/10ML IJ SOLN
500.0000 mg | Freq: Four times a day (QID) | INTRAVENOUS | Status: DC | PRN
  Filled 2018-06-29: qty 5

## 2018-06-29 MED ORDER — THROMBIN 5000 UNITS EX SOLR
CUTANEOUS | Status: AC
  Filled 2018-06-29: qty 5000

## 2018-06-29 MED ORDER — PROPOFOL 10 MG/ML IV BOLUS
INTRAVENOUS | Status: DC | PRN
  Administered 2018-06-29: 140 mg via INTRAVENOUS

## 2018-06-29 MED ORDER — ROCURONIUM BROMIDE 50 MG/5ML IV SOSY
PREFILLED_SYRINGE | INTRAVENOUS | Status: AC
  Filled 2018-06-29: qty 5

## 2018-06-29 MED ORDER — ONDANSETRON HCL 4 MG/2ML IJ SOLN
4.0000 mg | Freq: Four times a day (QID) | INTRAMUSCULAR | Status: DC | PRN
  Administered 2018-06-29: 4 mg via INTRAVENOUS
  Filled 2018-06-29: qty 2

## 2018-06-29 MED ORDER — FLEET ENEMA 7-19 GM/118ML RE ENEM: 1.0000 | ENEMA | Freq: Once | RECTAL | Status: DC | PRN

## 2018-06-29 MED ORDER — LIDOCAINE 2% (20 MG/ML) 5 ML SYRINGE
INTRAMUSCULAR | Status: DC | PRN
  Administered 2018-06-29: 80 mg via INTRAVENOUS

## 2018-06-29 MED ORDER — SODIUM CHLORIDE 0.9% FLUSH: 3.0000 mL | INTRAVENOUS | Status: DC | PRN

## 2018-06-29 MED ORDER — SERTRALINE HCL 50 MG PO TABS
50.0000 mg | ORAL_TABLET | Freq: Every day | ORAL | Status: DC
  Administered 2018-06-30 – 2018-07-05 (×6): 50 mg via ORAL
  Filled 2018-06-29 (×6): qty 1

## 2018-06-29 MED ORDER — FENTANYL CITRATE (PF) 100 MCG/2ML IJ SOLN
INTRAMUSCULAR | Status: AC
  Filled 2018-06-29: qty 2

## 2018-06-29 MED ORDER — FENTANYL CITRATE (PF) 250 MCG/5ML IJ SOLN
INTRAMUSCULAR | Status: DC | PRN
  Administered 2018-06-29 (×5): 50 ug via INTRAVENOUS
  Administered 2018-06-29: 100 ug via INTRAVENOUS
  Administered 2018-06-29 (×3): 50 ug via INTRAVENOUS

## 2018-06-29 MED ORDER — DONEPEZIL HCL 5 MG PO TABS
5.0000 mg | ORAL_TABLET | Freq: Every day | ORAL | Status: DC
  Administered 2018-06-30 – 2018-07-05 (×6): 5 mg via ORAL
  Filled 2018-06-29 (×6): qty 1

## 2018-06-29 MED ORDER — SENNA 8.6 MG PO TABS
1.0000 | ORAL_TABLET | Freq: Two times a day (BID) | ORAL | Status: DC
  Administered 2018-07-01 – 2018-07-05 (×8): 8.6 mg via ORAL
  Filled 2018-06-29 (×12): qty 1

## 2018-06-29 MED ORDER — DOCUSATE SODIUM 100 MG PO CAPS
100.0000 mg | ORAL_CAPSULE | Freq: Two times a day (BID) | ORAL | Status: DC
  Administered 2018-07-01 – 2018-07-05 (×8): 100 mg via ORAL
  Filled 2018-06-29 (×12): qty 1

## 2018-06-29 MED ORDER — CHLORHEXIDINE GLUCONATE CLOTH 2 % EX PADS: 6.0000 | MEDICATED_PAD | Freq: Once | CUTANEOUS | Status: DC

## 2018-06-29 MED ORDER — 0.9 % SODIUM CHLORIDE (POUR BTL) OPTIME
TOPICAL | Status: DC | PRN
  Administered 2018-06-29: 1000 mL

## 2018-06-29 MED ORDER — MEPERIDINE HCL 50 MG/ML IJ SOLN: 6.2500 mg | INTRAMUSCULAR | Status: DC | PRN

## 2018-06-29 MED ORDER — ENALAPRIL MALEATE 5 MG PO TABS
20.0000 mg | ORAL_TABLET | Freq: Two times a day (BID) | ORAL | Status: DC
  Administered 2018-06-29 – 2018-07-05 (×10): 20 mg via ORAL
  Filled 2018-06-29 (×13): qty 4

## 2018-06-29 MED ORDER — THROMBIN 20000 UNITS EX SOLR
CUTANEOUS | Status: DC | PRN
  Administered 2018-06-29: 08:00:00 via TOPICAL

## 2018-06-29 MED ORDER — SUGAMMADEX SODIUM 200 MG/2ML IV SOLN
INTRAVENOUS | Status: DC | PRN
  Administered 2018-06-29: 200 mg via INTRAVENOUS

## 2018-06-29 MED ORDER — ONDANSETRON HCL 4 MG PO TABS: 4.0000 mg | ORAL_TABLET | Freq: Four times a day (QID) | ORAL | Status: DC | PRN

## 2018-06-29 MED ORDER — BUPIVACAINE HCL (PF) 0.5 % IJ SOLN
INTRAMUSCULAR | Status: DC | PRN
  Administered 2018-06-29: 20 mL
  Administered 2018-06-29: 10 mL

## 2018-06-29 MED ORDER — MORPHINE SULFATE (PF) 4 MG/ML IV SOLN
4.0000 mg | INTRAVENOUS | Status: DC | PRN
  Filled 2018-06-29: qty 1

## 2018-06-29 MED ORDER — SODIUM CHLORIDE 0.9 % IV SOLN
INTRAVENOUS | Status: DC | PRN
  Administered 2018-06-29: 08:00:00

## 2018-06-29 MED ORDER — METHOCARBAMOL 500 MG PO TABS
500.0000 mg | ORAL_TABLET | Freq: Four times a day (QID) | ORAL | Status: DC | PRN
  Administered 2018-07-01 (×3): 500 mg via ORAL
  Filled 2018-06-29 (×4): qty 1

## 2018-06-29 MED ORDER — ACETAMINOPHEN 650 MG RE SUPP: 650.0000 mg | RECTAL | Status: DC | PRN

## 2018-06-29 MED ORDER — LIDOCAINE-EPINEPHRINE 1 %-1:100000 IJ SOLN
INTRAMUSCULAR | Status: DC | PRN
  Administered 2018-06-29: 10 mL

## 2018-06-29 MED ORDER — BISACODYL 10 MG RE SUPP
10.0000 mg | Freq: Every day | RECTAL | Status: DC | PRN
  Filled 2018-06-29: qty 1

## 2018-06-29 MED ORDER — PHENOL 1.4 % MT LIQD: 1.0000 | OROMUCOSAL | Status: DC | PRN

## 2018-06-29 MED ORDER — CEFAZOLIN SODIUM-DEXTROSE 2-4 GM/100ML-% IV SOLN
2.0000 g | INTRAVENOUS | Status: AC
  Administered 2018-06-29 (×2): 2 g via INTRAVENOUS

## 2018-06-29 MED ORDER — POLYETHYLENE GLYCOL 3350 17 G PO PACK
17.0000 g | PACK | Freq: Every day | ORAL | Status: DC | PRN
  Administered 2018-07-03: 17 g via ORAL
  Filled 2018-06-29: qty 1

## 2018-06-29 MED ORDER — THROMBIN 20000 UNITS EX SOLR
CUTANEOUS | Status: AC
  Filled 2018-06-29: qty 20000

## 2018-06-29 MED ORDER — ROCURONIUM BROMIDE 10 MG/ML (PF) SYRINGE
PREFILLED_SYRINGE | INTRAVENOUS | Status: DC | PRN
  Administered 2018-06-29: 20 mg via INTRAVENOUS
  Administered 2018-06-29: 15 mg via INTRAVENOUS
  Administered 2018-06-29: 25 mg via INTRAVENOUS
  Administered 2018-06-29: 10 mg via INTRAVENOUS
  Administered 2018-06-29: 50 mg via INTRAVENOUS

## 2018-06-29 MED ORDER — LACTATED RINGERS IV SOLN
INTRAVENOUS | Status: DC | PRN
  Administered 2018-06-29 (×2): via INTRAVENOUS

## 2018-06-29 MED ORDER — CEFAZOLIN SODIUM-DEXTROSE 2-4 GM/100ML-% IV SOLN
2.0000 g | Freq: Three times a day (TID) | INTRAVENOUS | Status: AC
  Administered 2018-06-29 – 2018-06-30 (×2): 2 g via INTRAVENOUS
  Filled 2018-06-29 (×3): qty 100

## 2018-06-29 MED ORDER — SODIUM CHLORIDE 0.9% FLUSH
3.0000 mL | Freq: Two times a day (BID) | INTRAVENOUS | Status: DC
  Administered 2018-06-30 – 2018-07-04 (×8): 3 mL via INTRAVENOUS

## 2018-06-29 MED ORDER — ONDANSETRON HCL 4 MG/2ML IJ SOLN
INTRAMUSCULAR | Status: AC
  Administered 2018-06-29: 4 mg
  Filled 2018-06-29: qty 2

## 2018-06-29 MED ORDER — DEXAMETHASONE SODIUM PHOSPHATE 10 MG/ML IJ SOLN
INTRAMUSCULAR | Status: DC | PRN
  Administered 2018-06-29: 10 mg via INTRAVENOUS

## 2018-06-29 MED ORDER — HYDROMORPHONE HCL 1 MG/ML IJ SOLN
0.2500 mg | INTRAMUSCULAR | Status: DC | PRN
  Administered 2018-06-29 (×2): 0.5 mg via INTRAVENOUS

## 2018-06-29 MED ORDER — HYDROMORPHONE HCL 1 MG/ML IJ SOLN
INTRAMUSCULAR | Status: AC
  Administered 2018-06-29: 0.5 mg via INTRAVENOUS
  Filled 2018-06-29: qty 1

## 2018-06-29 MED ORDER — LACTATED RINGERS IV SOLN
INTRAVENOUS | Status: DC
  Administered 2018-06-29: 14:00:00 via INTRAVENOUS

## 2018-06-29 MED ORDER — BUPIVACAINE HCL (PF) 0.5 % IJ SOLN
INTRAMUSCULAR | Status: AC
  Filled 2018-06-29: qty 30

## 2018-06-29 MED ORDER — THROMBIN 5000 UNITS EX SOLR
OROMUCOSAL | Status: DC | PRN
  Administered 2018-06-29: 08:00:00 via TOPICAL

## 2018-06-29 MED ORDER — PRAVASTATIN SODIUM 10 MG PO TABS
20.0000 mg | ORAL_TABLET | Freq: Every day | ORAL | Status: DC
  Administered 2018-06-29: 20 mg via ORAL
  Filled 2018-06-29: qty 2

## 2018-06-29 MED ORDER — LIDOCAINE-EPINEPHRINE 1 %-1:100000 IJ SOLN
INTRAMUSCULAR | Status: AC
  Filled 2018-06-29: qty 1

## 2018-06-29 MED ORDER — ONDANSETRON HCL 4 MG/2ML IJ SOLN
INTRAMUSCULAR | Status: DC | PRN
  Administered 2018-06-29: 4 mg via INTRAVENOUS

## 2018-06-29 MED ORDER — ALUM & MAG HYDROXIDE-SIMETH 200-200-20 MG/5ML PO SUSP: 30.0000 mL | Freq: Four times a day (QID) | ORAL | Status: DC | PRN

## 2018-06-29 MED ORDER — SODIUM CHLORIDE 0.9 % IV SOLN
INTRAVENOUS | Status: DC | PRN
  Administered 2018-06-29: 25 ug/min via INTRAVENOUS

## 2018-06-29 MED ORDER — ESTRADIOL 1 MG PO TABS
0.5000 mg | ORAL_TABLET | Freq: Every day | ORAL | Status: DC
  Administered 2018-06-29 – 2018-07-05 (×7): 0.5 mg via ORAL
  Filled 2018-06-29 (×7): qty 0.5

## 2018-06-29 MED ORDER — SODIUM CHLORIDE 0.9 % IV SOLN: 250.0000 mL | INTRAVENOUS | Status: DC

## 2018-06-29 MED ORDER — PROPOFOL 10 MG/ML IV BOLUS
INTRAVENOUS | Status: AC
  Filled 2018-06-29: qty 40

## 2018-06-29 MED ORDER — OXYCODONE-ACETAMINOPHEN 5-325 MG PO TABS
1.0000 | ORAL_TABLET | ORAL | Status: DC | PRN
  Administered 2018-06-29 – 2018-07-05 (×14): 2 via ORAL
  Filled 2018-06-29 (×15): qty 2

## 2018-06-29 MED ORDER — MENTHOL 3 MG MT LOZG: 1.0000 | LOZENGE | OROMUCOSAL | Status: DC | PRN

## 2018-06-29 MED ORDER — ACETAMINOPHEN 325 MG PO TABS
650.0000 mg | ORAL_TABLET | ORAL | Status: DC | PRN
  Administered 2018-07-01: 650 mg via ORAL
  Filled 2018-06-29: qty 2

## 2018-06-29 MED ORDER — PROGESTERONE MICRONIZED 100 MG PO CAPS
100.0000 mg | ORAL_CAPSULE | Freq: Every day | ORAL | Status: DC
  Administered 2018-06-29 – 2018-07-04 (×6): 100 mg via ORAL
  Filled 2018-06-29 (×6): qty 1

## 2018-06-29 SURGICAL SUPPLY — 67 items
BAG DECANTER FOR FLEXI CONT (MISCELLANEOUS) ×2 IMPLANT
BASKET BONE COLLECTION (BASKET) IMPLANT
BLADE CLIPPER SURG (BLADE) IMPLANT
BONE CANC CHIPS 20CC PCAN1/4 (Bone Implant) ×2 IMPLANT
BUR MATCHSTICK NEURO 3.0 LAGG (BURR) ×2 IMPLANT
CAGE POROUS ATEC 7X9X25 5D (Cage) ×4 IMPLANT
CANISTER SUCT 3000ML PPV (MISCELLANEOUS) IMPLANT
CHIPS CANC BONE 20CC PCAN1/4 (Bone Implant) ×1 IMPLANT
CONN OPEN OPEN REV 11 5.0/6.35 (Connector) ×4 IMPLANT
CONNECTOR OPEN ILIAC 20 (Connector) ×4 IMPLANT
CONNECTOR OPEN ILIAC 30 (Connector) ×4 IMPLANT
CONNECTOR OPN REV11 5.0/6.35 (Connector) ×2 IMPLANT
CONT SPEC 4OZ CLIKSEAL STRL BL (MISCELLANEOUS) ×4 IMPLANT
COVER BACK TABLE 60X90IN (DRAPES) ×2 IMPLANT
COVER WAND RF STERILE (DRAPES) ×2 IMPLANT
DECANTER SPIKE VIAL GLASS SM (MISCELLANEOUS) ×4 IMPLANT
DERMABOND ADVANCED (GAUZE/BANDAGES/DRESSINGS) ×1
DERMABOND ADVANCED .7 DNX12 (GAUZE/BANDAGES/DRESSINGS) ×1 IMPLANT
DEVICE DISSECT PLASMABLAD 3.0S (MISCELLANEOUS) ×1 IMPLANT
DRAPE C-ARM 42X72 X-RAY (DRAPES) ×4 IMPLANT
DRAPE C-ARMOR (DRAPES) ×2 IMPLANT
DRAPE HALF SHEET 40X57 (DRAPES) ×2 IMPLANT
DRAPE LAPAROTOMY 100X72X124 (DRAPES) ×2 IMPLANT
DURAPREP 26ML APPLICATOR (WOUND CARE) ×2 IMPLANT
DURASEAL APPLICATOR TIP (TIP) IMPLANT
DURASEAL SPINE SEALANT 3ML (MISCELLANEOUS) IMPLANT
ELECT REM PT RETURN 9FT ADLT (ELECTROSURGICAL) ×2
ELECTRODE REM PT RTRN 9FT ADLT (ELECTROSURGICAL) ×1 IMPLANT
GAUZE 4X4 16PLY RFD (DISPOSABLE) IMPLANT
GAUZE SPONGE 4X4 12PLY STRL (GAUZE/BANDAGES/DRESSINGS) ×2 IMPLANT
GLOVE BIOGEL PI IND STRL 8.5 (GLOVE) ×2 IMPLANT
GLOVE BIOGEL PI INDICATOR 8.5 (GLOVE) ×2
GLOVE ECLIPSE 8.5 STRL (GLOVE) ×6 IMPLANT
GOWN STRL REUS W/ TWL LRG LVL3 (GOWN DISPOSABLE) IMPLANT
GOWN STRL REUS W/ TWL XL LVL3 (GOWN DISPOSABLE) IMPLANT
GOWN STRL REUS W/TWL 2XL LVL3 (GOWN DISPOSABLE) ×4 IMPLANT
GOWN STRL REUS W/TWL LRG LVL3 (GOWN DISPOSABLE)
GOWN STRL REUS W/TWL XL LVL3 (GOWN DISPOSABLE)
HEMOSTAT POWDER KIT SURGIFOAM (HEMOSTASIS) IMPLANT
INTERLOCK LORDOTIC ROD 5.5 X70 (Rod) ×2 IMPLANT
KIT BASIN OR (CUSTOM PROCEDURE TRAY) ×2 IMPLANT
KIT INFUSE SMALL (Orthopedic Implant) ×2 IMPLANT
KIT TURNOVER KIT B (KITS) ×2 IMPLANT
LORDOTIC ROD 5.5 X 70 (Rod) ×4 IMPLANT
MILL MEDIUM DISP (BLADE) ×2 IMPLANT
NEEDLE HYPO 22GX1.5 SAFETY (NEEDLE) ×2 IMPLANT
NS IRRIG 1000ML POUR BTL (IV SOLUTION) ×2 IMPLANT
PACK LAMINECTOMY NEURO (CUSTOM PROCEDURE TRAY) ×2 IMPLANT
PAD ARMBOARD 7.5X6 YLW CONV (MISCELLANEOUS) ×6 IMPLANT
PATTIES SURGICAL .5 X1 (DISPOSABLE) ×2 IMPLANT
PLASMABLADE 3.0S (MISCELLANEOUS) ×2
SCREW CLOSE ILIAC POLY 8.5X80 (Screw) ×4 IMPLANT
SCREW KODIAK 6.5X40 (Screw) ×4 IMPLANT
SET SCREW (Screw) ×12 IMPLANT
SET SCREW SPNE (Screw) ×12 IMPLANT
SPONGE LAP 4X18 RFD (DISPOSABLE) IMPLANT
SPONGE SURGIFOAM ABS GEL 100 (HEMOSTASIS) ×2 IMPLANT
SUT PROLENE 6 0 BV (SUTURE) IMPLANT
SUT VIC AB 1 CT1 18XBRD ANBCTR (SUTURE) ×2 IMPLANT
SUT VIC AB 1 CT1 8-18 (SUTURE) ×2
SUT VIC AB 2-0 CP2 18 (SUTURE) ×4 IMPLANT
SUT VIC AB 3-0 SH 8-18 (SUTURE) ×2 IMPLANT
SYR 3ML LL SCALE MARK (SYRINGE) ×8 IMPLANT
TOWEL GREEN STERILE (TOWEL DISPOSABLE) ×2 IMPLANT
TOWEL GREEN STERILE FF (TOWEL DISPOSABLE) ×2 IMPLANT
TRAY FOLEY MTR SLVR 16FR STAT (SET/KITS/TRAYS/PACK) ×2 IMPLANT
WATER STERILE IRR 1000ML POUR (IV SOLUTION) ×2 IMPLANT

## 2018-06-29 NOTE — Op Note (Signed)
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 Surgeon: Barnett Abu First Assistant: Lisbeth Renshaw MD Anesthesia: General endotracheal Indications: [name] is a 78 year old individual whose had significant lumbar radicular pain in the left lower extremity.  She has had a previous decompression and fusion from L2-L5 and now has recurrent pain and L5 is most likely involved secondary to significant spondylitic stenosis at L5-S1.  She is been advised regarding surgical intervention to decompress and stabilize this region and fixation will need to be completed to the ileum as she has a long segment fusion from L2-L5 above the current problem.  Procedure: Patient was brought to the operating room supine on the stretcher.  After the smooth induction of general tracheal anesthesia she was turned prone and back was prepped with alcohol DuraPrep and draped in a sterile fashion.  The previously made inferior portion of her lumbar incision was opened and carried down to the lumbodorsal fascia.  By dissecting out laterally we identified the hardware at L5.  The interval between the L4 and L5 screws was then cleared and noting that there was approximately 12 mm of space this area was cleared to allow placement of a side connector.  To side connectors were then placed on the rods at the space between L4 and L5.  Then the inferior portion incision was exposed and the laminar arch of L5 out to and including the facets was exposed.  Laminectomy was then performed removing the inferior margin lamina of L5 out to and including the entirety of  the facet at the L5-S1 space.  The thickened redundant ligament in this area was taken up and the lateral recesses were then cleared to expose the path of the common dural tube the L5 nerve root superiorly and the S1 nerve root inferiorly care was taken to protect the nerves and gradually decompressed them using a high-speed drill and a 2 and a 3 mm Kerrison punch.  Once the decompression was completed the disc space was isolated discectomy was undertaken.  There is noted to be significant calcification in the ligament posteriorly gradually the ligament was opened that the space could be entered but distraction of the interspace gradually took time working slowly from one side to the other as there was significant ligamental thickening causing the lateral recess stenosis that was so impinging the L5 nerve roots with the release of the disc space using a gradual ligamentotaxis the interspace was decorticated and an appropriate size spacer was placed into the interval is felt that a 7 mm tall spacer would fit best with 5 degrees of lordosis.  Once the endplates were prepared autograft allograft and infuse was packed into the interspace for a total volume of 6 cc and then 2 spacers were placed.  Pedicle entry sites were then chosen at S1 and 6.5 x 40 mm pedicle screws were placed in S1 then by going to the posterior iliac prominence the iliac crest was opened and using a blunt probe and iliac screws measuring 8.5 x 80 mm was placed in either iliac crest.  Radiographic confirmation of the positioning of the screws was obtained.  When this was verified side connectors were placed on the iliac screws measuring  30 mm in length short 15 mm side connectors were used on the L5 screws to allow medial placement of a rod between the L2-L5 construct down to the ileum.  A 70 mm precontoured rod was used for this purpose.  The system was provisionally tightened and final radiographs were obtained and the system was torqued into  final position.  Prior to doing this the lateral gutters between the transverse process of L5 and the sacral ala had been decorticated and packed and a total of 60 cc of autograft allograft and infuse was packed into each of these lateral gutters.  Once this was completed and the system was torqued final radiographs were obtained in AP and lateral projection.  The retractors were removed care was taken to make sure that the L5 and S1 nerve roots were well decompressed and then the lumbodorsal fascia was closed with #1 Vicryl 2-0 Vicryl was used in subcutaneous tissues and 3-0 Vicryl subcuticularly.  Dermabond was placed on the skin.  Blood loss for the procedure was estimated at 150 cc.

## 2018-06-29 NOTE — Anesthesia Postprocedure Evaluation (Signed)
Anesthesia Post Note  Patient: WINDI MCFAUL  Procedure(s) Performed: Lumbar 5 Sacral 1 Posterior lumbar interbody fusion with fixation to ilium (N/A Back)     Patient location during evaluation: PACU Anesthesia Type: General Level of consciousness: awake and alert Pain management: pain level controlled Vital Signs Assessment: post-procedure vital signs reviewed and stable Respiratory status: spontaneous breathing, nonlabored ventilation, respiratory function stable and patient connected to nasal cannula oxygen Cardiovascular status: blood pressure returned to baseline and stable Postop Assessment: no apparent nausea or vomiting Anesthetic complications: no    Last Vitals:  Vitals:   06/29/18 1524 06/29/18 1948  BP: 139/71 129/62  Pulse: 88 86  Resp:  17  Temp: 36.6 C 37.1 C  SpO2: 92% 93%    Last Pain:  Vitals:   06/29/18 1948  TempSrc: Oral  PainSc:                  Rokhaya Quinn COKER

## 2018-06-29 NOTE — Progress Notes (Signed)
Orthopedic Tech Progress Note Patient Details:  [name] 01-Nov-1940 [id number]  Patient ID: [name], female   DOB: [birth year], 78 y.o.   MRN: [id number] Called brace order to bio tech.  Trinna Post 06/29/2018, 2:56 PM

## 2018-06-29 NOTE — Anesthesia Procedure Notes (Signed)
Procedure Name: Intubation Date/Time: 06/29/2018 7:50 AM Performed by: Julieta Bellini, CRNA Pre-anesthesia Checklist: Emergency Drugs available, Suction available, Patient being monitored and Patient identified Patient Re-evaluated:Patient Re-evaluated prior to induction Oxygen Delivery Method: Circle system utilized Preoxygenation: Pre-oxygenation with 100% oxygen Induction Type: IV induction Ventilation: Mask ventilation without difficulty Laryngoscope Size: Mac and 3 Grade View: Grade I Tube type: Oral Tube size: 7.0 mm Number of attempts: 1 Airway Equipment and Method: Stylet Placement Confirmation: ETT inserted through vocal cords under direct vision,  positive ETCO2 and breath sounds checked- equal and bilateral Secured at: 21 cm Tube secured with: Tape Dental Injury: Teeth and Oropharynx as per pre-operative assessment

## 2018-06-29 NOTE — Transfer of Care (Signed)
Immediate Anesthesia Transfer of Care Note  Patient: [name]  Procedure(s) Performed: Lumbar 5 Sacral 1 Posterior lumbar interbody fusion with fixation to ilium (N/A Back)  Patient Location: PACU  Anesthesia Type:General  Level of Consciousness: drowsy and patient cooperative  Airway & Oxygen Therapy: Patient Spontanous Breathing and Patient connected to nasal cannula oxygen  Post-op Assessment: Report given to RN, Post -op Vital signs reviewed and stable and Patient moving all extremities X 4  Post vital signs: Reviewed and stable  Last Vitals:  Vitals Value Taken Time  BP 176/87 06/29/2018 12:52 PM  Temp    Pulse 90 06/29/2018 12:54 PM  Resp 28 06/29/2018 12:54 PM  SpO2 78 % 06/29/2018 12:54 PM  Vitals shown include unvalidated device data.  Last Pain:  Vitals:   06/29/18 0623  TempSrc:   PainSc: 4          Complications: No apparent anesthesia complications

## 2018-06-29 NOTE — H&P (Signed)
[name] is an 78 y.o. female.   Chief Complaint: Low back left buttock and leg pain HPI: [name] is a 78 year old individual who has had previous lumbar spondylosis and stenosis with radicular pain and neurogenic claudication.  She has undergone a decompression and fusion from L2-L5.  This was several years ago she has recurrent left radicular pain and has advanced degenerative changes at L5-S1.  She has had a number of injections which yielded temporary relief but despite this and physical therapy she has had continued problems with pain.  She has been advised regarding surgical decompression and stabilization of L5-S1.  This will require fixation to the ileum as she has a long segment fusion above L5.  Past Medical History:  Diagnosis Date  . Anxiety   . Arthritis   . Bartholin's gland abscess 04/2017   Left, incised and drained  . High cholesterol   . Hypertension     Past Surgical History:  Procedure Laterality Date  . BREAST EXCISIONAL BIOPSY Right    x 2  . BREAST SURGERY     Biopsy  . BUNIONECTOMY    . CHOLECYSTECTOMY    . gallbladder removed  2010  . LUMBAR FUSION  2011  . right breast biopsy  72   two biopsies several years apart  . right knee replacement   2011  . TONSILLECTOMY AND ADENOIDECTOMY    . 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 Stroke Mother   . Cancer Father        colon  . Diabetes Father   . Breast cancer Maternal Aunt   . Breast cancer Maternal Grandmother    Social History:  reports that she has never smoked. She has never used smokeless tobacco. She reports that she does not drink alcohol or use drugs.  Allergies: No Known Allergies  Medications Prior to Admission  Medication Sig Dispense Refill  . aspirin EC 81  MG tablet Take 81 mg by mouth at bedtime.    . Cholecalciferol (VITAMIN D3) 125 MCG (5000 UT) CAPS Take 5,000 Units by mouth daily.    Marland Kitchen. desonide (DESOWEN) 0.05 % ointment Apply 1 application topically 2 (two) times daily as needed (for rash).    . donepezil (ARICEPT) 5 MG tablet Take 5 mg by mouth daily.    . enalapril (VASOTEC) 20 MG tablet Take 20 mg by mouth 2 (two) times daily.     Marland Kitchen. estradiol (ESTRACE) 0.5 MG tablet Take 1 tablet (0.5 mg total) by mouth daily. 90 tablet 4  . meloxicam (MOBIC) 15 MG tablet Take 15 mg by mouth daily.    . pravastatin (PRAVACHOL) 20 MG tablet Take 20 mg by mouth daily.      . progesterone (PROMETRIUM) 100 MG capsule Take 1 capsule (100 mg total) by mouth daily. (Patient taking differently: Take 100 mg by mouth at bedtime. ) 90 capsule 4  . sertraline (ZOLOFT) 50 MG tablet Take 50 mg by mouth daily.      . clobetasol ointment (TEMOVATE) 0.05 % Apply topically 2 (two) times daily. As needed for itching (Patient not taking: Reported on 06/12/2018) 30 g 1    No results found for this or any previous visit (from the past 48 hour(s)). No results found.  Review of Systems  Constitutional: Negative.   HENT: Negative.   Eyes: Negative.   Respiratory: Negative.   Cardiovascular: Negative.   Gastrointestinal: Negative.   Genitourinary: Negative.   Musculoskeletal: Positive for back pain.  Skin: Negative.   Neurological: Positive for tingling and focal weakness.  Endo/Heme/Allergies: Negative.   Psychiatric/Behavioral: Negative.     Blood pressure (!) 179/75, pulse 80, temperature (!) 97.5 F (36.4 C), temperature source Oral, resp. rate 20, height 5\' 7"  (1.702 m), weight 81.6 kg, last menstrual period 06/06/1996, SpO2 98 %. Physical Exam  Constitutional: She is oriented to person, place, and time. She appears well-developed and well-nourished.  HENT:  Head: Normocephalic and atraumatic.  Eyes: Pupils are equal, round, and reactive to light. Conjunctivae  and EOM are normal.  Neck: Normal range of motion. Neck supple.  Cardiovascular: Normal rate and regular rhythm.  Respiratory: Effort normal and breath sounds normal.  GI: Soft. Bowel sounds are normal.  Musculoskeletal:     Comments: Well-healed midline scar posteriorly positive straight leg raising at 30 degrees in either lower extremity Patrick's maneuver is negative bilaterally.  Neurological: She is alert and oriented to person, place, and time.  Absent deep tendon reflexes in the patellae and the Achilles.  Mild weakness in the tibialis anterior on the left.  Graded 4 out of 5.  Upper extremity strength and cranial nerve examination is within limits of normal.  Skin: Skin is warm and dry.  Psychiatric: She has a normal mood and affect. Her behavior is normal. Judgment and thought content normal.     Assessment/Plan Spondylosis and stenosis L5-S1 history of fusion L2-L5.  Plan: Decompression and fusion L5-S1 with fixation to the ileum.  Kristen Dama, MD 06/29/2018, 7:40 AM

## 2018-06-30 MED ORDER — GABAPENTIN 100 MG PO CAPS
200.0000 mg | ORAL_CAPSULE | Freq: Three times a day (TID) | ORAL | Status: DC
  Administered 2018-06-30 – 2018-07-05 (×16): 200 mg via ORAL
  Filled 2018-06-30 (×16): qty 2

## 2018-06-30 MED ORDER — PRAVASTATIN SODIUM 10 MG PO TABS
20.0000 mg | ORAL_TABLET | Freq: Every day | ORAL | Status: DC
  Administered 2018-06-30 – 2018-07-04 (×5): 20 mg via ORAL
  Filled 2018-06-30 (×5): qty 2

## 2018-06-30 NOTE — Evaluation (Addendum)
Occupational Therapy Evaluation Patient Details Name: [name] MRN: [id number] DOB: 1940/07/29 Today's Date: 06/30/2018    History of Present Illness Patient is a 78 yo female s/p Decompression and fusion L5-S1 with fixation to the ileum   Clinical Impression   Patient is s/p Decompression and fusion L5-S1 with fixation to the ile surgery resulting in the deficits listed below (see OT Problem List). PLOF was independent.  Patient will benefit from skilled acute OT to increase their safety with transfers and independence with ADL and functional mobility for ADL (while adhering to their precautions) to facilitate discharge to home alone. The patient has 3 in 1, elevated toilete. em, shower chair, grab bars, walker, canes, reacher to complete ADLS when returning home. The patient was able to report 2/3 precautions and was able to don and doff brace.        Follow Up Recommendations  No OT follow up with supervision     Equipment Recommendations       Recommendations for Other Services       Precautions / Restrictions Precautions Precautions: Back Precaution Booklet Issued: Yes (comment) Precaution Comments: verbally reviewed with patient Required Braces or Orthoses: Spinal Brace Spinal Brace: Lumbar corset Restrictions Weight Bearing Restrictions: No      Mobility Bed Mobility Overal bed mobility: (presented in chair) Bed Mobility: Supine to Sit     Supine to sit: Supervision     General bed mobility comments: supervision for safety cues for technique  Transfers Overall transfer level: Needs assistance Equipment used: Rolling walker (2 wheeled) Transfers: Sit to/from Stand Sit to Stand: Min guard         General transfer comment: min guard for safety and stability    Balance Overall balance assessment: Mild deficits observed, not formally tested                                         ADL either performed or assessed with clinical  judgement   ADL Overall ADL's : Needs assistance/impaired Eating/Feeding: Independent;Sitting   Grooming: Wash/dry hands;Modified independent;Standing   Upper Body Bathing: Modified independent;Sitting;Cueing for sequencing   Lower Body Bathing: Cueing for sequencing;Cueing for compensatory techniques;Sit to/from stand;Min guard   Upper Body Dressing : Independent;Sitting   Lower Body Dressing: Cueing for sequencing;Modified independent   Toilet Transfer: Supervision/safety;Comfort height toilet   Toileting- Clothing Manipulation and Hygiene: Modified independent;Cueing for back precautions   Tub/ Shower Transfer: Walk-in shower;Cueing for sequencing;Min guard   Functional mobility during ADLs: Min guard;Rolling walker       Vision Baseline Vision/History: No visual deficits Vision Assessment?: No apparent visual deficits     Development worker, international aid Tested?: No   Praxis Praxis Praxis tested?: Within functional limits    Pertinent Vitals/Pain Pain Assessment: 0-10 Pain Score: 6  Faces Pain Scale: Hurts even more Pain Location: low back and hips Pain Descriptors / Indicators: Sore;Aching Pain Intervention(s): Limited activity within patient's tolerance;Monitored during session     Hand Dominance Right   Extremity/Trunk Assessment Upper Extremity Assessment Upper Extremity Assessment: Overall WFL for tasks assessed   Lower Extremity Assessment Lower Extremity Assessment: Defer to PT evaluation   Cervical / Trunk Assessment Cervical / Trunk Assessment: Other exceptions Cervical / Trunk Exceptions: s/p sx   Communication Communication Communication: No difficulties   Cognition Arousal/Alertness: Awake/alert Behavior During Therapy: WFL for tasks assessed/performed Overall Cognitive Status: Within Functional Limits  for tasks assessed                                     General Comments       Exercises     Shoulder Instructions       Home Living Family/patient expects to be discharged to:: Private residence Living Arrangements: Alone Available Help at Discharge: Friend(s);Available 24 hours/day Type of Home: House Home Access: Level entry     Home Layout: One level     Bathroom Shower/Tub: Producer, television/film/video: Handicapped height Bathroom Accessibility: Yes How Accessible: Accessible via walker Home Equipment: Shower seat - built in;Bedside commode;Walker - 2 wheels;Walker - 4 wheels   Additional Comments: reported also collection of canes      Prior Functioning/Environment Level of Independence: Independent                 OT Problem List: Decreased strength;Decreased knowledge of use of DME or AE;Decreased knowledge of precautions;Pain      06/30/18 0933  OT Plan  OT Frequency (ACUTE ONLY) Min 2X/week  OT Treatment/Interventions (ACUTE ONLY) Self-care/ADL training;DME and/or AE instruction;Therapeutic activities;Patient/family education    OT Treatment/Interventions:      OT Goals(Current goals can be found in the care plan section) Acute Rehab OT Goals Patient Stated Goal: to decrease pain OT Goal Formulation: With patient Time For Goal Achievement: 07/07/18 Potential to Achieve Goals: Good  OT Frequency:     Barriers to D/C:            Co-evaluation              AM-PAC OT "6 Clicks" Daily Activity     Outcome Measure Help from another person eating meals?: None Help from another person taking care of personal grooming?: None Help from another person toileting, which includes using toliet, bedpan, or urinal?: None Help from another person bathing (including washing, rinsing, drying)?: A Little Help from another person to put on and taking off regular upper body clothing?: A Little Help from another person to put on and taking off regular lower body clothing?: None 6 Click Score: 22   End of Session Equipment Utilized During Treatment: Rolling walker;Back  brace  Activity Tolerance: Patient tolerated treatment well;No increased pain Patient left: in chair;with call bell/phone within reach  OT Visit Diagnosis: Unsteadiness on feet (R26.81);Pain;Muscle weakness (generalized) (M62.81) Pain - part of body: (back)                Time: 0973-5329 OT Time Calculation (min): 18 min Charges:  OT General Charges $OT Visit: 1 Visit OT Evaluation $OT Eval Low Complexity: 1 Low  Alphia Moh OTR/L  Acute Rehab Services  [phone] office number [phone] pager number   Alphia Moh 06/30/2018, 9:47 AM

## 2018-06-30 NOTE — Plan of Care (Signed)
  Problem: Education: Goal: Knowledge of General Education information will improve Description Including pain rating scale, medication(s)/side effects and non-pharmacologic comfort measures Outcome: Progressing   Problem: Health Behavior/Discharge Planning: Goal: Ability to manage health-related needs will improve Outcome: Progressing   Problem: Clinical Measurements: Goal: Ability to maintain clinical measurements within normal limits will improve Outcome: Progressing   Problem: Clinical Measurements: Goal: Will remain free from infection Outcome: Progressing   Problem: Clinical Measurements: Goal: Diagnostic test results will improve Outcome: Progressing   Problem: Clinical Measurements: Goal: Respiratory complications will improve Outcome: Progressing   Problem: Clinical Measurements: Goal: Cardiovascular complication will be avoided Outcome: Progressing   Problem: Activity: Goal: Risk for activity intolerance will decrease Outcome: Progressing   Problem: Nutrition: Goal: Adequate nutrition will be maintained Outcome: Progressing   Problem: Pain Managment: Goal: General experience of comfort will improve Outcome: Progressing

## 2018-06-30 NOTE — Progress Notes (Addendum)
Patient ID: [name], female   DOB: 06/23/1940, 78 y.o.   MRN: [id number] Vital signs are stable She is alert oriented moving about slowly Mobilizing today without catheter We will see how she does over the next day or 2 and hopeful for discharge to home after that We will add Neurontin for residual pain that she feels that this works quicker than muscle relaxers

## 2018-06-30 NOTE — Evaluation (Signed)
Physical Therapy Evaluation Patient Details Name: [name] MRN: [id number] DOB: 1940-10-05 Today's Date: 06/30/2018   History of Present Illness  Patient is a 78 yo female s/p Decompression and fusion L5-S1 with fixation to the ileum  Clinical Impression  Patient seen for mobility assessment s/p spinal surgery. Mobilizing fairly well. Educated patient on precautions, mobility expectations,and safety. Anticipate patient will continue to progress well, will recommend supervision and HHPT but may not need follow up depending on amount of recovery and functional improvements before discharge. Will follow acutely, see as indicated and progress as tolerated.   Follow Up Recommendations Home health PT;Supervision for mobility/OOB    Equipment Recommendations  None recommended by PT    Recommendations for Other Services       Precautions / Restrictions Precautions Precautions: Back Precaution Comments: verbally reviewed with patient Required Braces or Orthoses: Spinal Brace Spinal Brace: Lumbar corset      Mobility  Bed Mobility Overal bed mobility: Needs Assistance Bed Mobility: Supine to Sit     Supine to sit: Supervision     General bed mobility comments: supervision for safety cues for technique  Transfers Overall transfer level: Needs assistance Equipment used: Rolling walker (2 wheeled) Transfers: Sit to/from Stand Sit to Stand: Min guard         General transfer comment: min guard for safety and stability  Ambulation/Gait Ambulation/Gait assistance: Min guard Gait Distance (Feet): 40 Feet Assistive device: Rolling walker (2 wheeled) Gait Pattern/deviations: Step-through pattern;Decreased stride length Gait velocity: decreased   General Gait Details: reliance on RW for mobility   Stairs            Wheelchair Mobility    Modified Rankin (Stroke Patients Only)       Balance Overall balance assessment: Mild deficits observed, not formally  tested                                           Pertinent Vitals/Pain Pain Assessment: Faces Faces Pain Scale: Hurts even more Pain Location: low back and hips Pain Descriptors / Indicators: Sore Pain Intervention(s): Monitored during session    Home Living Family/patient expects to be discharged to:: Private residence Living Arrangements: Alone Available Help at Discharge: Friend(s);Available 24 hours/day Type of Home: House Home Access: Level entry     Home Layout: One level Home Equipment: Shower seat - built in;Bedside commode;Walker - 2 wheels;Walker - 4 wheels      Prior Function Level of Independence: Independent               Hand Dominance   Dominant Hand: Right    Extremity/Trunk Assessment   Upper Extremity Assessment Upper Extremity Assessment: Overall WFL for tasks assessed    Lower Extremity Assessment Lower Extremity Assessment: Generalized weakness    Cervical / Trunk Assessment Cervical / Trunk Assessment: (s/p spinal surgery)  Communication   Communication: No difficulties  Cognition Arousal/Alertness: Awake/alert Behavior During Therapy: WFL for tasks assessed/performed Overall Cognitive Status: Within Functional Limits for tasks assessed                                        General Comments      Exercises     Assessment/Plan    PT Assessment Patient needs continued PT services  PT Problem  List Decreased strength;Decreased balance;Decreased activity tolerance;Decreased mobility;Decreased knowledge of precautions;Decreased safety awareness;Pain       PT Treatment Interventions DME instruction;Gait training;Functional mobility training;Therapeutic activities;Therapeutic exercise;Balance training;Patient/family education;Neuromuscular re-education    PT Goals (Current goals can be found in the Care Plan section)  Acute Rehab PT Goals Patient Stated Goal: to not hurt PT Goal Formulation: With  patient Time For Goal Achievement: 07/14/18 Potential to Achieve Goals: Good    Frequency Min 5X/week   Barriers to discharge Decreased caregiver support      Co-evaluation               AM-PAC PT "6 Clicks" Mobility  Outcome Measure Help needed turning from your back to your side while in a flat bed without using bedrails?: A Little Help needed moving from lying on your back to sitting on the side of a flat bed without using bedrails?: A Little Help needed moving to and from a bed to a chair (including a wheelchair)?: A Little Help needed standing up from a chair using your arms (e.g., wheelchair or bedside chair)?: A Little Help needed to walk in hospital room?: A Little Help needed climbing 3-5 steps with a railing? : A Little 6 Click Score: 18    End of Session Equipment Utilized During Treatment: Back brace Activity Tolerance: Patient tolerated treatment well Patient left: in chair;with call bell/phone within reach Nurse Communication: Mobility status PT Visit Diagnosis: Unsteadiness on feet (R26.81);Difficulty in walking, not elsewhere classified (R26.2)    Time: [id number] PT Time Calculation (min) (ACUTE ONLY): 21 min   Charges:   PT Evaluation $PT Eval Moderate Complexity: 1 Mod          Charlotte Crumbevon Nariya Neumeyer, PT DPT  Board Certified Neurologic Specialist Acute Rehabilitation Services Pager [phone] Office [phone]   Fabio AsaDevon J Rodarius Kichline 06/30/2018, 9:21 AM

## 2018-06-30 NOTE — Progress Notes (Addendum)
9:27: PT recommended home health which falls to [facility]. Please re-consult CSW for future social work needs. CSW signing off.   CSW acknowledges consult for possible SNF placement. CSW awaiting for PT recommendations for follow up.   Tenna Delaine, LCSW, LCAS-A Clinical Social Worker II [phone]

## 2018-07-01 NOTE — Progress Notes (Signed)
  NEUROSURGERY PROGRESS NOTE   No issues overnight.  Pain manageable No concerns this am  EXAM:  BP (!) 110/43   Pulse 79   Temp 98.3 F (36.8 C)   Resp 20   Ht 5\' 7"  (1.702 m)   Wt 81.6 kg   LMP 06/06/1996   SpO2 100%   BMI 28.19 kg/m   Awake, alert, oriented  Speech fluent, appropriate  CN grossly intact  5/5 strength BLE Incision c/d/i  PLAN Doing well this am Continue to mobilize with PT/OT

## 2018-07-01 NOTE — Progress Notes (Signed)
Physical Therapy Treatment Patient Details Name: Kristen MontgomeryMartha E Hilley MRN: [id number] DOB: 11/19/1940 Today's Date: 07/01/2018    History of Present Illness Patient is a 78 yo female s/p Decompression and fusion L5-S1 with fixation to the ileum    PT Comments    Patient seen for activity progression, tolerated OOB and ambulation with RW. Max cues for compliance with precautions throughout session. Patient able to perform functional tasks/ toileting prior to ambulation. Current POC remains appropriate.   Follow Up Recommendations  Home health PT;Supervision for mobility/OOB     Equipment Recommendations  None recommended by PT    Recommendations for Other Services       Precautions / Restrictions Precautions Precautions: Back Precaution Booklet Issued: Yes (comment) Precaution Comments: verbally reviewed with patient Required Braces or Orthoses: Spinal Brace Spinal Brace: Lumbar corset    Mobility  Bed Mobility Overal bed mobility: Needs Assistance Bed Mobility: Rolling;Sidelying to Sit Rolling: Supervision Sidelying to sit: Supervision       General bed mobility comments: Supervision for safety with max cues for compliance with precautions  Transfers Overall transfer level: Needs assistance Equipment used: Rolling walker (2 wheeled) Transfers: Sit to/from Stand Sit to Stand: Supervision         General transfer comment: supervision for safety, performed from bed and from BCSx2  Ambulation/Gait Ambulation/Gait assistance: Min guard Gait Distance (Feet): 120 Feet Assistive device: Rolling walker (2 wheeled) Gait Pattern/deviations: Step-through pattern;Decreased stride length Gait velocity: decreased Gait velocity interpretation: <1.8 ft/sec, indicate of risk for recurrent falls General Gait Details: Heavy reliance on RW for support, cues for posture and positioning   Stairs             Wheelchair Mobility    Modified Rankin (Stroke Patients Only)        Balance Overall balance assessment: Mild deficits observed, not formally tested                                          Cognition Arousal/Alertness: Awake/alert Behavior During Therapy: WFL for tasks assessed/performed Overall Cognitive Status: Within Functional Limits for tasks assessed                                        Exercises      General Comments        Pertinent Vitals/Pain Pain Assessment: 0-10 Faces Pain Scale: Hurts even more Pain Location: low back  Pain Descriptors / Indicators: Sore Pain Intervention(s): Monitored during session    Home Living                      Prior Function            PT Goals (current goals can now be found in the care plan section) Acute Rehab PT Goals Patient Stated Goal: to not hurt PT Goal Formulation: With patient Time For Goal Achievement: 07/14/18 Potential to Achieve Goals: Good    Frequency    Min 5X/week      PT Plan Current plan remains appropriate    Co-evaluation              AM-PAC PT "6 Clicks" Mobility   Outcome Measure  Help needed turning from your back to your side while in a flat bed without using bedrails?:  A Little Help needed moving from lying on your back to sitting on the side of a flat bed without using bedrails?: A Little Help needed moving to and from a bed to a chair (including a wheelchair)?: A Little Help needed standing up from a chair using your arms (e.g., wheelchair or bedside chair)?: A Little Help needed to walk in hospital room?: A Little Help needed climbing 3-5 steps with a railing? : A Little 6 Click Score: 18    End of Session Equipment Utilized During Treatment: Back brace Activity Tolerance: Patient tolerated treatment well Patient left: in chair;with call bell/phone within reach;with family/visitor present Nurse Communication: Mobility status PT Visit Diagnosis: Unsteadiness on feet (R26.81);Difficulty in  walking, not elsewhere classified (R26.2)     Time: 5277-8242 PT Time Calculation (min) (ACUTE ONLY): 19 min  Charges:  $Gait Training: 8-22 mins                     Charlotte Crumb, PT DPT  Board Certified Neurologic Specialist Acute Rehabilitation Services Pager [phone] Office [phone]    Fabio Asa 07/01/2018, 3:28 PM

## 2018-07-01 NOTE — Progress Notes (Signed)
OT Cancellation Note  Patient Details Name: AKEIRA SWENDSEN MRN: [id number] DOB: 04/19/1941   Cancelled Treatment:    Reason Eval/Treat Not Completed: Fatigue/lethargy limiting ability to participate.  Pt sleeping soundly   Jeani Hawking, OTR/L Acute Rehabilitation Services Pager [phone] Office [phone]   Jeani Hawking M 07/01/2018, 5:42 PM

## 2018-07-02 MED ORDER — MAGNESIUM HYDROXIDE 400 MG/5ML PO SUSP
30.0000 mL | Freq: Every day | ORAL | Status: DC | PRN
  Administered 2018-07-03: 30 mL via ORAL
  Filled 2018-07-02: qty 30

## 2018-07-02 NOTE — Care Management Important Message (Signed)
Important Message  Patient Details  Name: [name] MRN: [id number] Date of Birth: 19-Dec-1940   Medicare Important Message Given:  Yes    Corey Harold 07/02/2018, 4:26 PM

## 2018-07-02 NOTE — Progress Notes (Signed)
Occupational Therapy Treatment Patient Details Name: Kristen MontgomeryMartha E Cicio MRN: [id number] DOB: 08/29/1940 Today's Date: 07/02/2018    History of present illness Patient is a 78 yo female s/p Decompression and fusion L5-S1 with fixation to the ileum   OT comments  Patient seated in recliner, awake and agreeable to OT.  Patient continues to require cueing for back precaution adherence with limited carryover, requires assist to recall precautions (able to recall 2/3 without assist).  Improved alertness compared to PT session this am, but continues to require increased time for processing, initiation and problem solving.  Reviewed use of LB AE body mechanics and safety, during ADLs.  Based on performance today, and needing increased assist for ADLs compared to evaluation, recommend HHOT services and 24/7 support initially; pt agreeable to recommendations.    Follow Up Recommendations  Home health OT;Supervision/Assistance - 24 hour(24/7 inital)    Equipment Recommendations  None recommended by OT    Recommendations for Other Services      Precautions / Restrictions Precautions Precautions: Back Precaution Booklet Issued: Yes (comment) Precaution Comments: patient able to recall 2/3 precuations without cueing, requires max verbal cueing to adhere to during functional tasks  Required Braces or Orthoses: Spinal Brace Spinal Brace: Lumbar corset(on upon entry) Restrictions Weight Bearing Restrictions: No       Mobility Bed Mobility Overal bed mobility: Needs Assistance Bed Mobility: Rolling;Sidelying to Sit Rolling: Mod assist Sidelying to sit: Min assist       General bed mobility comments: OOB in recliner   Transfers Overall transfer level: Needs assistance Equipment used: Rolling walker (2 wheeled) Transfers: Sit to/from Stand Sit to Stand: Min guard         General transfer comment: min guard for safeyt and balance, cueing for hand placement and sequencing requiring increased  time     Balance Overall balance assessment: Needs assistance Sitting-balance support: Feet supported;No upper extremity supported Sitting balance-Leahy Scale: Fair     Standing balance support: No upper extremity supported Standing balance-Leahy Scale: Fair Standing balance comment: standing at sink with min guard for safety                            ADL either performed or assessed with clinical judgement   ADL Overall ADL's : Needs assistance/impaired     Grooming: Oral care;Min guard;Standing Grooming Details (indicate cue type and reason): increased time, min guard for safety, requires max cueing for precautions with poor adherance             Lower Body Dressing: Minimal assistance;Cueing for compensatory techniques;Cueing for back precautions;Cueing for safety;With adaptive equipment;Sit to/from stand Lower Body Dressing Details (indicate cue type and reason): min guard in standing, unable to complete figure 4 techinque for socks without min assist; reviewed use of reacher and sock aide  Toilet Transfer: Min guard;Ambulation;RW Toilet Transfer Details (indicate cue type and reason): simulated to/from recliner, cueing for hand placement          Functional mobility during ADLs: Set up;Rolling walker General ADL Comments: increased time for processing and problem solving, will conitnue to require education of AE for LB self care     Vision       Perception     Praxis      Cognition Arousal/Alertness: Awake/alert Behavior During Therapy: Flat affect Overall Cognitive Status: Impaired/Different from baseline Area of Impairment: Following commands;Safety/judgement;Problem solving  Current Attention Level: Selective   Following Commands: Follows one step commands with increased time Safety/Judgement: Decreased awareness of deficits;Decreased awareness of safety   Problem Solving: Slow processing;Decreased  initiation;Difficulty sequencing;Requires verbal cues General Comments: noted improved alertness and processing compared to PT session, continues to require increased time, cueing for problem sovling, and poor carryover of techniques due to precautions         Exercises     Shoulder Instructions       General Comments SPo2 on RA maintained <95%, RN cleared to maintain off supplemental oxgyen at completion of session      Pertinent Vitals/ Pain       Pain Assessment: 0-10 Pain Score: 4  Pain Location: bilat hips/butt Pain Descriptors / Indicators: Sore Pain Intervention(s): Monitored during session;Repositioned  Home Living                                          Prior Functioning/Environment              Frequency  Min 2X/week        Progress Toward Goals  OT Goals(current goals can now be found in the care plan section)  Progress towards OT goals: Not progressing toward goals - comment(increased assist compared to evaulation)  Acute Rehab OT Goals Patient Stated Goal: to not hurt OT Goal Formulation: With patient Time For Goal Achievement: 07/07/18 Potential to Achieve Goals: Good  Plan Discharge plan needs to be updated;Frequency remains appropriate    Co-evaluation                 AM-PAC OT "6 Clicks" Daily Activity     Outcome Measure   Help from another person eating meals?: None Help from another person taking care of personal grooming?: A Little Help from another person toileting, which includes using toliet, bedpan, or urinal?: A Little Help from another person bathing (including washing, rinsing, drying)?: A Little Help from another person to put on and taking off regular upper body clothing?: A Little Help from another person to put on and taking off regular lower body clothing?: A Little 6 Click Score: 19    End of Session Equipment Utilized During Treatment: Rolling walker;Back brace  OT Visit Diagnosis:  Unsteadiness on feet (R26.81);Pain;Muscle weakness (generalized) (M62.81) Pain - part of body: (back)   Activity Tolerance Patient tolerated treatment well   Patient Left in chair;with call bell/phone within reach   Nurse Communication Mobility status;Other (comment)(oxgyen saturations)        Time: [id number] OT Time Calculation (min): 31 min  Charges: OT General Charges $OT Visit: 1 Visit OT Treatments $Self Care/Home Management : 23-37 mins  Chancy Milroyhristie S Brunella Wileman, OT Acute Rehabilitation Services Pager [phone] Office [phone]    Chancy MilroyChristie S Andrya Roppolo 07/02/2018, 10:11 AM

## 2018-07-02 NOTE — Progress Notes (Signed)
Patient ID: [name], female   DOB: [birth year], 78 y.o.   MRN: [id number] Recovery is coming but slowly She would like to stay until Thursday.  She lives independently.  She does not have any interest in going to a skilled nursing facility. She has not had a bowel movement.  Will add milk of magnesia.

## 2018-07-02 NOTE — NC FL2 (Signed)
[place] MEDICAID FL2 LEVEL OF CARE SCREENING TOOL     IDENTIFICATION  Patient Name: [name] Birthdate: 02-Apr-1941 Sex: female Admission Date (Current Location): 06/29/2018  [facility] and [place] Number:  Producer, television/film/video and Address:  [employer], [address] [place], [place] [address]      Provider Number: [id number]  Attending Physician Name and Address:  Barnett Abu, MD  Relative Name and Phone Number:  Alfredia Ferguson; friend; [phone]    Current Level of Care: Hospital Recommended Level of Care: Skilled Nursing Facility Prior Approval Number:    Date Approved/Denied:   PASRR Number: pending  Discharge Plan: SNF    Current Diagnoses: Patient Active Problem List   Diagnosis Date Noted  . Lumbar radiculopathy, chronic 06/29/2018  . OA (osteoarthritis) of knee 09/19/2016  . Palpitations 12/09/2015  . Right carotid bruit 12/09/2015  . Syncope 12/08/2015  . Degenerative disc disease, lumbar 12/08/2015  . Essential hypertension 12/08/2015  . High cholesterol   . Arthritis     Orientation RESPIRATION BLADDER Height & Weight     Self, Time, Situation, Place  Normal, O2(nasal canula 1L intermittant) Continent Weight: 180 lb (81.6 kg) Height:  5\' 7"  (170.2 cm)  BEHAVIORAL SYMPTOMS/MOOD NEUROLOGICAL BOWEL NUTRITION STATUS      Continent Diet(see discharge summary)  AMBULATORY STATUS COMMUNICATION OF NEEDS Skin   Limited Assist Verbally (incision on back with liquid skin adhesive)                       Personal Care Assistance Level of Assistance  Bathing, Feeding, Dressing Bathing Assistance: Limited assistance Feeding assistance: Independent Dressing Assistance: Limited assistance     Functional Limitations Info  Sight, Hearing, Speech Sight Info: Adequate Hearing Info: Adequate Speech Info: Adequate    SPECIAL CARE FACTORS FREQUENCY  PT (By licensed PT), OT (By licensed OT)     PT Frequency: 5x week OT  Frequency: 5x week            Contractures Contractures Info: Not present    Additional Factors Info  Code Status, Psychotropic, Allergies Code Status Info: Full Code Allergies Info: No Known Allergies  Psychotropic Info: donepezil (ARICEPT) tablet 5 mg daily PO; sertraline (ZOLOFT) tablet 50 mg daily PO         Current Medications (07/02/2018):  This is the current hospital active medication list Current Facility-Administered Medications  Medication Dose Route Frequency Provider Last Rate Last Dose  . 0.9 %  sodium chloride infusion  250 mL Intravenous Continuous Barnett Abu, MD      . acetaminophen (TYLENOL) tablet 650 mg  650 mg Oral Q4H PRN Barnett Abu, MD   650 mg at 07/01/18 1513   Or  . acetaminophen (TYLENOL) suppository 650 mg  650 mg Rectal Q4H PRN Barnett Abu, MD      . alum & mag hydroxide-simeth (MAALOX/MYLANTA) 200-200-20 MG/5ML suspension 30 mL  30 mL Oral Q6H PRN Barnett Abu, MD      . bisacodyl (DULCOLAX) suppository 10 mg  10 mg Rectal Daily PRN Barnett Abu, MD      . docusate sodium (COLACE) capsule 100 mg  100 mg Oral BID Barnett Abu, MD   100 mg at 07/02/18 0949  . donepezil (ARICEPT) tablet 5 mg  5 mg Oral Daily Barnett Abu, MD   5 mg at 07/02/18 0949  . enalapril (VASOTEC) tablet 20 mg  20 mg Oral BID Barnett Abu, MD  20 mg at 07/01/18 2247  . estradiol (ESTRACE) tablet 0.5 mg  0.5 mg Oral Daily Barnett AbuElsner, Henry, MD   0.5 mg at 07/02/18 0949  . gabapentin (NEURONTIN) capsule 200 mg  200 mg Oral TID Barnett AbuElsner, Henry, MD   200 mg at 07/02/18 0949  . lactated ringers infusion   Intravenous Continuous Barnett AbuElsner, Henry, MD 125 mL/hr at 06/29/18 1427    . menthol-cetylpyridinium (CEPACOL) lozenge 3 mg  1 lozenge Oral PRN Barnett AbuElsner, Henry, MD       Or  . phenol (CHLORASEPTIC) mouth spray 1 spray  1 spray Mouth/Throat PRN Barnett AbuElsner, Henry, MD      . methocarbamol (ROBAXIN) tablet 500 mg  500 mg Oral Q6H PRN Barnett AbuElsner, Henry, MD   500 mg at 07/01/18 2247   Or  .  methocarbamol (ROBAXIN) 500 mg in dextrose 5 % 50 mL IVPB  500 mg Intravenous Q6H PRN Barnett AbuElsner, Henry, MD      . morphine 4 MG/ML injection 4 mg  4 mg Intravenous Q2H PRN Barnett AbuElsner, Henry, MD      . ondansetron (ZOFRAN) tablet 4 mg  4 mg Oral Q6H PRN Barnett AbuElsner, Henry, MD       Or  . ondansetron (ZOFRAN) injection 4 mg  4 mg Intravenous Q6H PRN Barnett AbuElsner, Henry, MD   4 mg at 06/29/18 2202  . oxyCODONE-acetaminophen (PERCOCET/ROXICET) 5-325 MG per tablet 1-2 tablet  1-2 tablet Oral Q3H PRN Barnett AbuElsner, Henry, MD   2 tablet at 07/01/18 2253  . polyethylene glycol (MIRALAX / GLYCOLAX) packet 17 g  17 g Oral Daily PRN Barnett AbuElsner, Henry, MD      . pravastatin (PRAVACHOL) tablet 20 mg  20 mg Oral Daily Barnett AbuElsner, Henry, MD   20 mg at 07/01/18 1513  . progesterone (PROMETRIUM) capsule 100 mg  100 mg Oral Claris GladdenQHS Elsner, Henry, MD   100 mg at 07/01/18 2247  . senna (SENOKOT) tablet 8.6 mg  1 tablet Oral BID Barnett AbuElsner, Henry, MD   8.6 mg at 07/02/18 0949  . sertraline (ZOLOFT) tablet 50 mg  50 mg Oral Daily Barnett AbuElsner, Henry, MD   50 mg at 07/02/18 0949  . sodium chloride flush (NS) 0.9 % injection 3 mL  3 mL Intravenous Lance MussQ12H Elsner, Henry, MD   3 mL at 07/02/18 0951  . sodium chloride flush (NS) 0.9 % injection 3 mL  3 mL Intravenous PRN Barnett AbuElsner, Henry, MD      . sodium phosphate (FLEET) 7-19 GM/118ML enema 1 enema  1 enema Rectal Once PRN Barnett AbuElsner, Henry, MD         Discharge Medications: Please see discharge summary for a list of discharge medications.  Relevant Imaging Results:  Relevant Lab Results:   Additional Information [id number]  Alexei Ey H [place], [place]

## 2018-07-02 NOTE — Progress Notes (Signed)
Physical Therapy Treatment Patient Details Name: [name] MRN: [id number] DOB: 04-01-1941 Today's Date: 07/02/2018    History of Present Illness Patient is a 78 yo female s/p Decompression and fusion L5-S1 with fixation to the ileum    PT Comments    Pt lethargic today compared to previous session. Pt with delayed processing, impaired initiation and sequencing, poor carry over of precautions, and quickly fatigues. Pt was supervision last treatment for transfers and ambulation pt currently at min/modA due to lethargy, weakness, and onset of fatigue. Pt unable to process tolieting, donning of brace, and washing of hands without max directional verbal cues and physical assist. Pt unsafe to return home alone and would need 24/7 assist, if this is not available pt will need SNF to achieve safe mod I level of function.   Follow Up Recommendations  Home health PT;Supervision/Assistance - 24 hour(if 24/7 assist no available, pt will need SNF)     Equipment Recommendations  None recommended by PT    Recommendations for Other Services       Precautions / Restrictions Precautions Precautions: Back Precaution Booklet Issued: Yes (comment) Precaution Comments: pt only able to recall 2/3 precautions, pt re-educated, pt requires max verbal cues to adhere to them functionally Required Braces or Orthoses: Spinal Brace Spinal Brace: Lumbar corset Restrictions Weight Bearing Restrictions: No    Mobility  Bed Mobility Overal bed mobility: Needs Assistance Bed Mobility: Rolling;Sidelying to Sit Rolling: Mod assist Sidelying to sit: Min assist       General bed mobility comments: max directional verbal cues to roll to the L and not twist, mina for trunk elevation, significant increase in time, difficulty sequecing and processing  Transfers Overall transfer level: Needs assistance Equipment used: Rolling walker (2 wheeled) Transfers: Sit to/from Stand Sit to Stand: Min assist          General transfer comment: minA to power up, verbal cues to push up from bed, increased time, minA to steady during transition of hands  Ambulation/Gait Ambulation/Gait assistance: Min guard Gait Distance (Feet): 120 Feet Assistive device: Rolling walker (2 wheeled) Gait Pattern/deviations: Step-through pattern;Decreased stride length;Narrow base of support;Trunk flexed Gait velocity: decreased Gait velocity interpretation: <1.8 ft/sec, indicate of risk for recurrent falls General Gait Details: pt extremely slow, max verbal cues to walker mangement/sequencing, minA to maintain forward propulsion, pt c/o "Im so tired"   Stairs             Wheelchair Mobility    Modified Rankin (Stroke Patients Only)       Balance Overall balance assessment: Needs assistance Sitting-balance support: Feet supported;No upper extremity supported Sitting balance-Leahy Scale: Fair     Standing balance support: No upper extremity supported Standing balance-Leahy Scale: Fair Standing balance comment: pt able to stand at sink wiht min guard to wash hands without leaning on sin k                            Cognition Arousal/Alertness: Lethargic Behavior During Therapy: Flat affect Overall Cognitive Status: Impaired/Different from baseline Area of Impairment: Following commands;Safety/judgement;Problem solving;Attention                   Current Attention Level: Sustained   Following Commands: Follows one step commands with increased time Safety/Judgement: Decreased awareness of deficits;Decreased awareness of safety(quick onset of fatigue)   Problem Solving: Slow processing;Decreased initiation;Difficulty sequencing;Requires verbal cues;Requires tactile cues General Comments: pt very sleepy with significant delayed  response time, pt with poor carry over of precautions and requiring significant increased time to complete transfers, tolieting, and mobility.      Exercises       General Comments General comments (skin integrity, edema, etc.): SpO2 88-90% on RA      Pertinent Vitals/Pain Pain Assessment: 0-10 Pain Score: 4  Pain Location: bilat hips/butt Pain Descriptors / Indicators: Sore Pain Intervention(s): Monitored during session    Home Living                      Prior Function            PT Goals (current goals can now be found in the care plan section) Progress towards PT goals: Progressing toward goals    Frequency    Min 5X/week      PT Plan Discharge plan needs to be updated    Co-evaluation              AM-PAC PT "6 Clicks" Mobility   Outcome Measure  Help needed turning from your back to your side while in a flat bed without using bedrails?: A Lot Help needed moving from lying on your back to sitting on the side of a flat bed without using bedrails?: A Lot Help needed moving to and from a bed to a chair (including a wheelchair)?: A Little Help needed standing up from a chair using your arms (e.g., wheelchair or bedside chair)?: A Little Help needed to walk in hospital room?: A Little Help needed climbing 3-5 steps with a railing? : A Lot 6 Click Score: 15    End of Session Equipment Utilized During Treatment: Back brace Activity Tolerance: Patient limited by fatigue Patient left: in chair;with call bell/phone within reach Nurse Communication: Mobility status PT Visit Diagnosis: Unsteadiness on feet (R26.81);Difficulty in walking, not elsewhere classified (R26.2)     Time: 8828-0034 PT Time Calculation (min) (ACUTE ONLY): 32 min  Charges:  $Gait Training: 8-22 mins $Therapeutic Activity: 8-22 mins                     Lewis Shock, PT, DPT Acute Rehabilitation Services Pager #: [phone] Office #: [phone]    Iona Hansen 07/02/2018, 8:22 AM

## 2018-07-02 NOTE — Progress Notes (Signed)
Patient ID: [name], female   DOB: 01-Aug-1940, 78 y.o.   MRN: [id number] Vital signs are stable this morning still complains of pain in her hips bilaterally Incision is clean and dry Mobilizing slowly Continue supportive care No plan for discharge today

## 2018-07-02 NOTE — Clinical Social Work Note (Signed)
Clinical Social Work Assessment  Patient Details  Name: [name] MRN: [id number] Date of Birth: [birth year]  Date of referral:  07/02/18               Reason for consult:  Discharge Planning, Facility Placement                Permission sought to share information with:  Facility Medical sales representative, Other(Friend) Permission granted to share information::  No  Name::        Agency::     Relationship::     Contact Information:     Housing/Transportation Living arrangements for the past 2 months:  Single Family Home Source of Information:  Patient Patient Interpreter Needed:  None Criminal Activity/Legal Involvement Pertinent to Current Situation/Hospitalization:  No - Comment as needed Significant Relationships:  Friend, Neighbor Lives with:  Self Do you feel safe going back to the place where you live?  Yes Need for family participation in patient care:  Yes (Comment)  Care giving concerns:  Pt from home alone, she is recommended for Home Health with 24 hour assistance at discharge. Pt refusing SNF- requests to go home.   Social Worker assessment / plan:  CSW spoke with pt at bedside. Introduced self, role, and reason for visit. Pt from home alone, no pets, has neighbors and friends that come by and visit/support pt.  CSW explained recommendations-specifically 24 hour support. Pt states "I don't know why that would be necessary, when I'm asleep I sleep hard." We discussed that the recommendations were simply evaluations of what we feel would be safest for pt at discharge. Pt states understanding, she states she prefers home health services. Discussed that CSW remains available should disposition change.   Employment status:  Retired Database administrator PT Recommendations:  Home with Home Health, 24 Hour Supervision Information / Referral to community resources:  Skilled Nursing Facility  Patient/Family's Response to care:  Pt amenable to speaking with  CSW, prefers home health services.   Patient/Family's Understanding of and Emotional Response to Diagnosis, Current Treatment, and Prognosis:  Pt states understanding of diagnosis, current treatment and prognosis. Pt states she doesn't feel ready to leave hospital- we talked about that pt doctor would determine whether or not pt is medically stable for discharge and CSW/RNCM would support pt at that time with disposition. Pt was flat but emotionally appropriate throughout assessment.   Emotional Assessment Appearance:  Appears stated age Attitude/Demeanor/Rapport:  Other(quiet; flat) Affect (typically observed):  Calm, Flat, Accepting Orientation:  Oriented to Self, Oriented to  Time, Oriented to Situation, Oriented to Place Alcohol / Substance use:  Not Applicable Psych involvement (Current and /or in the community):  No (Comment)  Discharge Needs  Concerns to be addressed:  Care Coordination, Discharge Planning Concerns Readmission within the last 30 days:  No Current discharge risk:  Physical Impairment, Lives alone Barriers to Discharge:  Continued Medical Work up   [employer], LCSWA 07/02/2018, 12:33 PM

## 2018-07-02 NOTE — Social Work (Signed)
Aware pt recommended for supervision 24/7 per RN report pt from home alone. Will complete FL2. Pt PASRR pending, should she need SNF at d/c will need PASRR completed.   CSW continuing to follow for support with disposition when medically appropriate.  Octavio Graves, MSW, [facility] Clinical Social Work [phone]

## 2018-07-03 ENCOUNTER — Inpatient Hospital Stay (HOSPITAL_COMMUNITY): Payer: Medicare Other

## 2018-07-03 MED FILL — Sodium Chloride IV Soln 0.9%: INTRAVENOUS | Qty: 1000 | Status: AC

## 2018-07-03 MED FILL — Heparin Sodium (Porcine) Inj 1000 Unit/ML: INTRAMUSCULAR | Qty: 30 | Status: AC

## 2018-07-03 NOTE — Progress Notes (Signed)
Occupational Therapy Treatment Patient Details Name: [name] MRN: [id number] DOB: 04/21/1941 Today's Date: 07/03/2018    History of present illness Patient is a 78 yo female s/p Decompression and fusion L5-S1 with fixation to the ileum   OT comments  Patient progressing slowly.  Able to recall 3/3 spinal precautions, but requires cueing to adhere to during functional tasks.  Bed mobility with min guard to ensure log roll technique, min assist to return to sidelying at completing of session to assist B LEs.  Simulated shower transfers with min guard assist, and basic toilet transfers with supervision.  In room mobility with supervision using RW. Reviewed fall prevention techniques and home safety. Continue to recommend HHOT in order to optimize safety and independence and return to PLOF.    Follow Up Recommendations  Home health OT;Supervision/Assistance - 24 hour(inital 24/7 )    Equipment Recommendations  None recommended by OT    Recommendations for Other Services      Precautions / Restrictions Precautions Precautions: Back Precaution Booklet Issued: Yes (comment) Precaution Comments: able to recall 3/3 precautions, but requires cueing to adhere to during functional tasks  Required Braces or Orthoses: Spinal Brace Spinal Brace: Lumbar corset;Applied in sitting position(min cueing to don) Restrictions Weight Bearing Restrictions: No       Mobility Bed Mobility Overal bed mobility: Needs Assistance Bed Mobility: Rolling;Sidelying to Sit;Sit to Sidelying Rolling: Supervision Sidelying to sit: Min guard     Sit to sidelying: Min assist General bed mobility comments: Min guard to follow proper log roll technique to exit bed, min assist for B LE support returning to sidelying  Transfers Overall transfer level: Needs assistance Equipment used: Rolling walker (2 wheeled) Transfers: Sit to/from Stand Sit to Stand: Supervision         General transfer comment:  supervision for safety     Balance Overall balance assessment: Needs assistance Sitting-balance support: Feet supported;No upper extremity supported Sitting balance-Leahy Scale: Good     Standing balance support: No upper extremity supported Standing balance-Leahy Scale: Fair Standing balance comment: supervision for safety statically                           ADL either performed or assessed with clinical judgement   ADL Overall ADL's : Needs assistance/impaired             Lower Body Bathing: Supervison/ safety;Sitting/lateral leans;Cueing for compensatory techniques;Cueing for sequencing;Cueing for safety Lower Body Bathing Details (indicate cue type and reason): reviewed bathing seated on shower chair, sequence with back brace, and able to complete figure 4 technique with independence          Toilet Transfer: Ambulation;Supervision/safety;RW;BSC       Tub/ Shower Transfer: Min guard;Cueing for sequencing;3 in Nature conservation officer1;Grab bars;Rolling walker Tub/Shower Transfer Details (indicate cue type and reason): reviewed safety and reverse step transfer into shower with min guard for precautions and technique  Functional mobility during ADLs: Supervision/safety;Rolling walker General ADL Comments: patient progressing well, increased time/min cueing for problem sovling and safety     Vision       Perception     Praxis      Cognition Arousal/Alertness: Awake/alert Behavior During Therapy: Flat affect Overall Cognitive Status: Impaired/Different from baseline Area of Impairment: Problem solving;Safety/judgement                         Safety/Judgement: Decreased awareness of safety   Problem Solving:  Slow processing General Comments: patient progressing, continues to require cueing for safety and precautions (verbalizing the doctors don't know everything-about putting brace on seated), increased time to process and problem solve        Exercises      Shoulder Instructions       General Comments      Pertinent Vitals/ Pain       Pain Assessment: 0-10 Pain Score: 5  Pain Location: bilat hips Pain Descriptors / Indicators: Sore Pain Intervention(s): Monitored during session  Home Living                                          Prior Functioning/Environment              Frequency  Min 2X/week        Progress Toward Goals  OT Goals(current goals can now be found in the care plan section)  Progress towards OT goals: Progressing toward goals  Acute Rehab OT Goals Patient Stated Goal: to not hurt OT Goal Formulation: With patient Time For Goal Achievement: 07/07/18 Potential to Achieve Goals: Good  Plan Discharge plan remains appropriate;Frequency remains appropriate    Co-evaluation                 AM-PAC OT "6 Clicks" Daily Activity     Outcome Measure   Help from another person eating meals?: None Help from another person taking care of personal grooming?: A Little Help from another person toileting, which includes using toliet, bedpan, or urinal?: A Little Help from another person bathing (including washing, rinsing, drying)?: A Little Help from another person to put on and taking off regular upper body clothing?: None Help from another person to put on and taking off regular lower body clothing?: A Little 6 Click Score: 20    End of Session Equipment Utilized During Treatment: Rolling walker;Back brace  OT Visit Diagnosis: Unsteadiness on feet (R26.81);Pain;Muscle weakness (generalized) (M62.81) Pain - part of body: (back)   Activity Tolerance Patient tolerated treatment well   Patient Left in chair;with call bell/phone within reach   Nurse Communication Mobility status        Time: 1517-6160 OT Time Calculation (min): 22 min  Charges: OT General Charges $OT Visit: 1 Visit OT Treatments $Self Care/Home Management : 8-22 mins  Chancy Milroy, OT Acute  Rehabilitation Services Pager [phone] Office [phone]    Chancy Milroy 07/03/2018, 1:01 PM

## 2018-07-03 NOTE — Progress Notes (Signed)
Physical Therapy Treatment Patient Details Name: [name] MRN: [id number] DOB: [birth year] Today's Date: 07/03/2018    History of Present Illness Patient is a 78 yo female s/p Decompression and fusion L5-S1 with fixation to the ileum    PT Comments    Pt much improved cognitively compared to yesterday and demonstrated increased level of alertness. Pt SPO > 92% on RA this date. Pt continues to have slow processing and sequencing but functioning at supervision, min guard level for transfers and ambulation today. Pt cont to require verbal cues to adhere to precautions during functional mobility. Acute PT to cont to follow to achieve safe mod I level of function as pt going home alone.   Follow Up Recommendations  Home health PT;Supervision/Assistance - 24 hour     Equipment Recommendations  None recommended by PT    Recommendations for Other Services       Precautions / Restrictions Precautions Precautions: Back Precaution Booklet Issued: Yes (comment) Precaution Comments: able to recall 2/3, max verbal cues to adhere during function Required Braces or Orthoses: Spinal Brace Spinal Brace: Lumbar corset(pt able to don with increased time) Restrictions Weight Bearing Restrictions: No    Mobility  Bed Mobility Overal bed mobility: Needs Assistance Bed Mobility: Rolling;Sidelying to Sit Rolling: Supervision Sidelying to sit: Supervision       General bed mobility comments: HOB lowered to 20 deg to mimic sleeping on 2 pillows, pt required increased time but no physical assist needed, pt also dependent on bed rail to pull self into sidelying and push self up into sitting  Transfers Overall transfer level: Needs assistance Equipment used: Rolling walker (2 wheeled) Transfers: Sit to/from Stand Sit to Stand: Min guard         General transfer comment: min guard for safety, incresaed time, verbal cues to minimize trunk flexion  Ambulation/Gait Ambulation/Gait  assistance: Min guard Gait Distance (Feet): 140 Feet Assistive device: Rolling walker (2 wheeled) Gait Pattern/deviations: Step-through pattern;Decreased stride length;Narrow base of support;Trunk flexed Gait velocity: decresaed Gait velocity interpretation: <1.31 ft/sec, indicative of household ambulator General Gait Details: pt with improved walker management, verbal cues to stay in walker to minimize trunk flexion, no report of onset of fatigue today   Stairs             Wheelchair Mobility    Modified Rankin (Stroke Patients Only)       Balance Overall balance assessment: Needs assistance Sitting-balance support: Feet supported;No upper extremity supported Sitting balance-Leahy Scale: Good Sitting balance - Comments: pt able to don brace without LOB   Standing balance support: No upper extremity supported Standing balance-Leahy Scale: Fair Standing balance comment: standing at sink with min guard for safety                             Cognition Arousal/Alertness: Awake/alert Behavior During Therapy: Flat affect Overall Cognitive Status: Impaired/Different from baseline Area of Impairment: Problem solving                             Problem Solving: Slow processing General Comments: improved alertness and ability to follow commands and sequence today compared to yesterday      Exercises      General Comments General comments (skin integrity, edema, etc.): pt assisted to the commode, supervision for hygiene and donning pad      Pertinent Vitals/Pain Pain Assessment: 0-10 Pain Score:  5  Pain Location: bilat hips Pain Descriptors / Indicators: Sore Pain Intervention(s): Monitored during session    Home Living                      Prior Function            PT Goals (current goals can now be found in the care plan section) Progress towards PT goals: Progressing toward goals    Frequency    Min 5X/week      PT  Plan Current plan remains appropriate    Co-evaluation              AM-PAC PT "6 Clicks" Mobility   Outcome Measure  Help needed turning from your back to your side while in a flat bed without using bedrails?: A Little Help needed moving from lying on your back to sitting on the side of a flat bed without using bedrails?: A Little Help needed moving to and from a bed to a chair (including a wheelchair)?: A Little Help needed standing up from a chair using your arms (e.g., wheelchair or bedside chair)?: A Little Help needed to walk in hospital room?: A Little Help needed climbing 3-5 steps with a railing? : A Lot 6 Click Score: 17    End of Session Equipment Utilized During Treatment: Back brace Activity Tolerance: Patient limited by fatigue Patient left: in chair;with call bell/phone within reach Nurse Communication: Mobility status PT Visit Diagnosis: Unsteadiness on feet (R26.81);Difficulty in walking, not elsewhere classified (R26.2)     Time: 9833-8250 PT Time Calculation (min) (ACUTE ONLY): 31 min  Charges:  $Gait Training: 8-22 mins $Therapeutic Activity: 8-22 mins                     [name], PT, DPT Acute Rehabilitation Services Pager #: [phone] Office #: [phone]    [name] 07/03/2018, 8:18 AM

## 2018-07-03 NOTE — Progress Notes (Signed)
Patient ID: [name], female   DOB: 19-Apr-1941, 78 y.o.   MRN: [id number] Vital signs are stable Patient is mobilizing better Still having significant hip pain I will obtain AP and lateral radiograph of the lumbar spine in a standing position.

## 2018-07-04 MED ORDER — CELECOXIB 200 MG PO CAPS
200.0000 mg | ORAL_CAPSULE | Freq: Two times a day (BID) | ORAL | Status: DC
  Administered 2018-07-04 – 2018-07-05 (×3): 200 mg via ORAL
  Filled 2018-07-04 (×3): qty 1

## 2018-07-04 NOTE — Progress Notes (Signed)
Patient ID: [name], female   DOB: 01-24-1941, 78 y.o.   MRN: [id number] Vital signs are stable Patient requiring a moderate amount of assistance She feels she will be ready for discharge tomorrow Still having hip and groin pain We will add Celebrex today X-rays done yesterday demonstrates good fixation

## 2018-07-04 NOTE — Progress Notes (Signed)
Physical Therapy Treatment Patient Details Name: [name] MRN: [id number] DOB: July 25, 1940 Today's Date: 07/04/2018    History of Present Illness Patient is a 78 yo female s/p Decompression and fusion L5-S1 with fixation to the ileum    PT Comments    Pt progressing but remains to present with poor recall of precautions, slow processing, and increased falls risk. Pt unsafe to return home without 24/7 supervision. Pt reports she has help but not sure they can stay over night. Acute PT to cont to follow.    Follow Up Recommendations  Home health PT;Supervision/Assistance - 24 hour     Equipment Recommendations  None recommended by PT    Recommendations for Other Services       Precautions / Restrictions Precautions Precautions: Back Precaution Booklet Issued: Yes (comment) Precaution Comments: pt unable to recall "BLT" despite education since sunday. Pt only remember bending and lift and continues to require constant v/c's to adhere functionally Required Braces or Orthoses: Spinal Brace Spinal Brace: Lumbar corset;Applied in sitting position Restrictions Weight Bearing Restrictions: No    Mobility  Bed Mobility Overal bed mobility: Needs Assistance Bed Mobility: Rolling;Sidelying to Sit Rolling: Supervision Sidelying to sit: Min guard       General bed mobility comments: definite use of bed rail  Transfers Overall transfer level: Needs assistance Equipment used: Rolling walker (2 wheeled) Transfers: Sit to/from Stand Sit to Stand: Min guard         General transfer comment: pt with significant trunk flexion despite education, min guard for safety during transition of hands from bed to RW  Ambulation/Gait Ambulation/Gait assistance: Supervision Gait Distance (Feet): 120 Feet Assistive device: Rolling walker (2 wheeled) Gait Pattern/deviations: Step-through pattern;Decreased stride length;Narrow base of support;Trunk flexed Gait velocity: decresaed Gait  velocity interpretation: <1.31 ft/sec, indicative of household ambulator General Gait Details: max verbal cues to maintain upright posture, especially with onset of fatigue pt with progressive trunk flexion   Stairs             Wheelchair Mobility    Modified Rankin (Stroke Patients Only)       Balance Overall balance assessment: Needs assistance Sitting-balance support: Feet supported;No upper extremity supported Sitting balance-Leahy Scale: Good Sitting balance - Comments: pt able to don brace without LOB   Standing balance support: No upper extremity supported Standing balance-Leahy Scale: Fair Standing balance comment: supervision to wash hands at sink, pt also brushed teeth but supported self with LUE on sink                            Cognition Arousal/Alertness: Awake/alert Behavior During Therapy: Flat affect Overall Cognitive Status: Impaired/Different from baseline Area of Impairment: Memory;Problem solving;Safety/judgement                     Memory: Decreased recall of precautions(unable to recall BLT especially twisting despte constant edu)   Safety/Judgement: Decreased awareness of safety   Problem Solving: Slow processing General Comments: very slow overall processing      Exercises      General Comments General comments (skin integrity, edema, etc.): incision without drainage, pt assisted to the bathroom, pt supervision for hygiene however requiring increased time, unsure of thoroughness      Pertinent Vitals/Pain Pain Assessment: 0-10 Pain Score: 5  Pain Location: bilat hips Pain Descriptors / Indicators: Sore Pain Intervention(s): Monitored during session    Home Living  Prior Function            PT Goals (current goals can now be found in the care plan section) Progress towards PT goals: Progressing toward goals    Frequency    Min 5X/week      PT Plan Current plan remains  appropriate    Co-evaluation              AM-PAC PT "6 Clicks" Mobility   Outcome Measure  Help needed turning from your back to your side while in a flat bed without using bedrails?: A Little Help needed moving from lying on your back to sitting on the side of a flat bed without using bedrails?: A Little Help needed moving to and from a bed to a chair (including a wheelchair)?: A Little Help needed standing up from a chair using your arms (e.g., wheelchair or bedside chair)?: A Little Help needed to walk in hospital room?: A Little Help needed climbing 3-5 steps with a railing? : A Lot 6 Click Score: 17    End of Session Equipment Utilized During Treatment: Back brace Activity Tolerance: Patient limited by fatigue Patient left: in chair;with call bell/phone within reach Nurse Communication: Mobility status PT Visit Diagnosis: Unsteadiness on feet (R26.81);Difficulty in walking, not elsewhere classified (R26.2)     Time: 8937-3428 PT Time Calculation (min) (ACUTE ONLY): 31 min  Charges:  $Gait Training: 8-22 mins $Therapeutic Activity: 8-22 mins                     Lewis Shock, PT, DPT Acute Rehabilitation Services Pager #: [phone] Office #: [phone]    Iona Hansen 07/04/2018, 8:56 AM

## 2018-07-04 NOTE — Care Management Note (Signed)
Case Management Note  Patient Details  Name: [name] MRN: [id number] Date of Birth: [birth year]  Subjective/Objective:  Patient is a 78 yo female s/p Decompression and fusion L5-S1 with fixation to the ileum.  PTA, pt independent, lives alone.                    Action/Plan: PT/OT recommending HH follow up; pt states friends able to assist with care at dc.  MD:  Please leave orders for HHPT/OT with face to face documentation for Medicare.  RN Case Manager will be happy to arrange services.    Expected Discharge Date:                  Expected Discharge Plan:  Home w Home Health Services  In-House Referral:     Discharge planning Services  CM Consult  Post Acute Care Choice:  Home Health Choice offered to:  Patient  DME Arranged:    DME Agency:     HH Arranged:  PT, OT HH Agency:     Status of Service:  In process, will continue to follow  If discussed at Long Length of Stay Meetings, dates discussed:    Additional Comments:  [name], RN, BSN  Trauma/Neuro ICU Case Manager [phone]

## 2018-07-05 MED ORDER — GABAPENTIN 100 MG PO CAPS: 200.0000 mg | ORAL_CAPSULE | Freq: Three times a day (TID) | ORAL | 3 refills | Status: DC

## 2018-07-05 MED ORDER — OXYCODONE-ACETAMINOPHEN 5-325 MG PO TABS: 1.0000 | ORAL_TABLET | ORAL | 0 refills | Status: DC | PRN

## 2018-07-05 MED ORDER — CELECOXIB 200 MG PO CAPS: 200.0000 mg | ORAL_CAPSULE | Freq: Every day | ORAL | 2 refills | Status: DC

## 2018-07-05 MED ORDER — METHOCARBAMOL 500 MG PO TABS: 500.0000 mg | ORAL_TABLET | Freq: Four times a day (QID) | ORAL | 3 refills | Status: DC | PRN

## 2018-07-05 NOTE — Progress Notes (Signed)
Occupational Therapy Treatment Patient Details Name: [name] MRN: [id number] DOB: Jul 27, 1940 Today's Date: 07/05/2018    History of present illness Patient is a 78 yo female s/p Decompression and fusion L5-S1 with fixation to the ileum. PMhx: HLD, syncope, HTN, OA of the knee   OT comments  Pt progressing towards OT goals this session. ABle to complete toilet transfer, peri care, sink level grooming. Educated on compensatory methods with AE for LB dressing, Pt required mod verbal cues for back precautions throughout functional ADL. She has decreased recall of precautions despite education with PT previously this AM. Having HHOT in her own environment is essential for follow up for safety and for functioning in her own home environment.    Follow Up Recommendations  Home health OT;Supervision/Assistance - 24 hour(initially)    Equipment Recommendations  None recommended by OT    Recommendations for Other Services      Precautions / Restrictions Precautions Precautions: Back;Fall Precaution Booklet Issued: Yes (comment) Precaution Comments: Pt able to recall 2/3 precautions, required cues for BENDING despite recent PT education Required Braces or Orthoses: Spinal Brace Spinal Brace: Lumbar corset;Applied in sitting position Restrictions Weight Bearing Restrictions: No       Mobility Bed Mobility               General bed mobility comments: Pt OOB in recliner at beginning and end of session  Transfers Overall transfer level: Needs assistance Equipment used: Rolling walker (2 wheeled) Transfers: Sit to/from Stand Sit to Stand: Supervision         General transfer comment: cues for hand placement and posture    Balance Overall balance assessment: Needs assistance Sitting-balance support: Feet supported;No upper extremity supported Sitting balance-Leahy Scale: Good Sitting balance - Comments: pt able to don brace without LOB   Standing balance support: No  upper extremity supported Standing balance-Leahy Scale: Fair Standing balance comment: Pt able to perform oral care at sink, leaning against sink with hips                           ADL either performed or assessed with clinical judgement   ADL Overall ADL's : Needs assistance/impaired     Grooming: Wash/dry face;Oral care;Standing;Min guard;Cueing for compensatory techniques Grooming Details (indicate cue type and reason): increased time, min guard for safety, requires max cueing for precautions with poor adherance         Upper Body Dressing : Supervision/safety;Sitting Upper Body Dressing Details (indicate cue type and reason): to doff/don brace. Pt required cues for managing various straps and did not know to "re-tighten" the brace Lower Body Dressing: Minimal assistance;Cueing for compensatory techniques;Cueing for back precautions;Cueing for safety;With adaptive equipment;Sit to/from stand Lower Body Dressing Details (indicate cue type and reason): Pt reports that she has a Scientific laboratory technician at home. She needs continued practice with this skill as she required mod cues for back precautions Toilet Transfer: Supervision/safety;Ambulation;RW;Comfort height toilet;Grab bars Toilet Transfer Details (indicate cue type and reason): vc for safe hand placment Toileting- Clothing Manipulation and Hygiene: Modified independent;Cueing for back precautions       Functional mobility during ADLs: Supervision/safety;Rolling walker General ADL Comments: patient progressing well, cues during functional tasks for back precautions     Vision   Vision Assessment?: No apparent visual deficits   Perception     Praxis      Cognition Arousal/Alertness: Awake/alert Behavior During Therapy: WFL for tasks assessed/performed Overall Cognitive Status: Impaired/Different from baseline  Area of Impairment: Memory                     Memory: Decreased recall of precautions    Safety/Judgement: Decreased awareness of safety   Problem Solving: Slow processing General Comments: Pt unable to recall precautions despite recent education by PT. cues throughout to maintain precautions        Exercises     Shoulder Instructions       General Comments      Pertinent Vitals/ Pain       Pain Assessment: 0-10 Pain Score: 3  Pain Location: sore back and right hip Pain Descriptors / Indicators: Sore Pain Intervention(s): Monitored during session;Repositioned  Home Living                                          Prior Functioning/Environment              Frequency  Min 2X/week        Progress Toward Goals  OT Goals(current goals can now be found in the care plan section)  Progress towards OT goals: Progressing toward goals  Acute Rehab OT Goals Patient Stated Goal: get home today OT Goal Formulation: With patient Time For Goal Achievement: 07/07/18 Potential to Achieve Goals: Good  Plan Discharge plan remains appropriate;Frequency remains appropriate    Co-evaluation                 AM-PAC OT "6 Clicks" Daily Activity     Outcome Measure   Help from another person eating meals?: None Help from another person taking care of personal grooming?: A Little Help from another person toileting, which includes using toliet, bedpan, or urinal?: A Little Help from another person bathing (including washing, rinsing, drying)?: A Little Help from another person to put on and taking off regular upper body clothing?: None Help from another person to put on and taking off regular lower body clothing?: A Little 6 Click Score: 20    End of Session Equipment Utilized During Treatment: Rolling walker;Back brace  OT Visit Diagnosis: Unsteadiness on feet (R26.81);Pain;Muscle weakness (generalized) (M62.81) Pain - Right/Left: Right Pain - part of body: Hip(and back)   Activity Tolerance Patient tolerated treatment well   Patient  Left in chair;with call bell/phone within reach;with chair alarm set   Nurse Communication Mobility status        Time: [id number] OT Time Calculation (min): 27 min  Charges: OT General Charges $OT Visit: 1 Visit OT Treatments $Self Care/Home Management : 23-37 mins  Sherryl MangesLaura Aya Geisel OTR/L Acute Rehabilitation Services Pager: [phone] Office: [phone]   Evern BioLaura J Abia Monaco 07/05/2018, 10:49 AM

## 2018-07-05 NOTE — Discharge Summary (Signed)
Physician Discharge Summary  Patient ID: [name] MRN: [id number] DOB/AGE: 1941-03-03 79 y.o.  Admit date: 06/29/2018 Discharge date: 07/05/2018  Admission Diagnoses: Spondylosis L5-S1 with radiculopathy.  History of decompression and fusion L2-L5.  Discharge Diagnoses: Myelosis L5-S1 with radiculopathy.  History of decompression and fusion L2-L5. Active Problems:   Lumbar radiculopathy, chronic   Discharged Condition: good  Hospital Course: Patient was admitted to undergo surgical decompression at L5-S1 with fixation from L2 to the ileum.  She tolerated surgery well.  Consults: None  Significant Diagnostic Studies: None  Treatments: surgery: Decompression and fusion L5-S1 with fixation from L2 to the ileum, posterior lumbar interbody arthrodesis L5-S1, posterior lateral arthrodesis L5-S1  Discharge Exam: Blood pressure (!) 155/70, pulse 71, temperature 98.1 F (36.7 C), temperature source Oral, resp. rate 18, height 5\' 7"  (1.702 m), weight 81.6 kg, last menstrual period 06/06/1996, SpO2 97 %. Incision is clean and dry Station and gait are intact  Disposition: Discharge disposition: 01-Home or Self Care       Discharge Instructions    Call MD for:  redness, tenderness, or signs of infection (pain, swelling, redness, odor or green/yellow discharge around incision site)   Complete by:  As directed    Call MD for:  severe uncontrolled pain   Complete by:  As directed    Call MD for:  temperature >100.4   Complete by:  As directed    Diet - low sodium heart healthy   Complete by:  As directed    Discharge instructions   Complete by:  As directed    Okay to shower. Do not apply salves or appointments to incision. No heavy lifting with the upper extremities greater than 15 pounds. May resume driving when not requiring pain medication and patient feels comfortable with doing so.   Incentive spirometry RT   Complete by:  As directed    Increase activity slowly   Complete  by:  As directed       Signed: Stefani Dama 07/05/2018, 9:08 AM

## 2018-07-05 NOTE — Progress Notes (Signed)
Physical Therapy Treatment Patient Details Name: Kristen MontgomeryMartha E Dross MRN: [id number] DOB: 10/16/1940 Today's Date: 07/05/2018    History of Present Illness Patient is a 78 yo female s/p Decompression and fusion L5-S1 with fixation to the ileum. PMhx: HLD, syncope, HTN, OA of the knee    PT Comments    Pt pleasant and urgently moving to bathroom on arrival with pt unable to achieve surface of toilet before BM. Pt with assist for linen change and lower body bathing prior to further mobility. Pt with increased ability with transfers and gait who is safe for return home with assistance. All precautions and mobility reviewed with pt able to state understanding but needs cues to follow.     Follow Up Recommendations  Home health PT;Supervision/Assistance - 24 hour     Equipment Recommendations  None recommended by PT    Recommendations for Other Services       Precautions / Restrictions Precautions Precautions: Back;Fall Precaution Booklet Issued: Yes (comment) Precaution Comments: pt able to state lifting and twisting with "bacon" stated when cued for B Required Braces or Orthoses: Spinal Brace Spinal Brace: Lumbar corset;Applied in sitting position Restrictions Weight Bearing Restrictions: No    Mobility  Bed Mobility               General bed mobility comments: standing on the way to bathroom on arrival  Transfers Overall transfer level: Needs assistance Equipment used: Rolling walker (2 wheeled) Transfers: Sit to/from Stand Sit to Stand: Supervision         General transfer comment: cues for hand placement and posture x 2 [facility]  [facility]  Ambulation/Gait Ambulation/Gait assistance: Supervision Gait Distance (Feet): 175 Feet Assistive device: Rolling walker (2 wheeled) Gait Pattern/deviations: Step-through pattern;Decreased stride length;Trunk flexed   Gait velocity interpretation: >2.62 ft/sec, indicative of community ambulatory General Gait Details:  mod cues for posture and position in RW   Stairs             Wheelchair Mobility    Modified Rankin (Stroke Patients Only)       Balance Overall balance assessment: Needs assistance Sitting-balance support: Feet supported;No upper extremity supported Sitting balance-Leahy Scale: Good Sitting balance - Comments: pt able to don brace without LOB   Standing balance support: No upper extremity supported Standing balance-Leahy Scale: Fair                              Cognition Arousal/Alertness: Awake/alert Behavior During Therapy: Flat affect Overall Cognitive Status: Impaired/Different from baseline Area of Impairment: Memory                     Memory: Decreased recall of precautions   Safety/Judgement: Decreased awareness of safety         Exercises      General Comments        Pertinent Vitals/Pain Pain Score: 3  Pain Location: sore back and right hip Pain Descriptors / Indicators: Sore Pain Intervention(s): Limited activity within patient's tolerance;Monitored during session;Repositioned    Home Living                      Prior Function            PT Goals (current goals can now be found in the care plan section) Progress towards PT goals: Progressing toward goals    Frequency  PT Plan Current plan remains appropriate    Co-evaluation              AM-PAC PT "6 Clicks" Mobility   Outcome Measure  Help needed turning from your back to your side while in a flat bed without using bedrails?: A Little Help needed moving from lying on your back to sitting on the side of a flat bed without using bedrails?: A Little Help needed moving to and from a bed to a chair (including a wheelchair)?: A Little Help needed standing up from a chair using your arms (e.g., wheelchair or bedside chair)?: A Little Help needed to walk in hospital room?: A Little Help needed climbing 3-5 steps with a railing? : A  Little 6 Click Score: 18    End of Session Equipment Utilized During Treatment: Back brace Activity Tolerance: Patient tolerated treatment well Patient left: in chair;with call bell/phone within reach;with chair alarm set Nurse Communication: Mobility status PT Visit Diagnosis: Unsteadiness on feet (R26.81);Difficulty in walking, not elsewhere classified (R26.2)     Time: 6979-4801 PT Time Calculation (min) (ACUTE ONLY): 24 min  Charges:  $Gait Training: 8-22 mins $Therapeutic Activity: 8-22 mins                     Waldo Damian Abner Greenspan, PT Acute Rehabilitation Services Pager: [phone] Office: [phone]    Heywood Tokunaga B Maikayla Beggs 07/05/2018, 10:18 AM

## 2018-07-05 NOTE — Care Management Note (Signed)
Case Management Note  Patient Details  Name: [name] MRN: [id number] Date of Birth: 1941-04-20  Subjective/Objective:  Patient is a 78 yo female s/p Decompression and fusion L5-S1 with fixation to the ileum.  PTA, pt independent, lives alone.                    Action/Plan: PT/OT recommending HH follow up; pt states friends able to assist with care at dc.  MD:  Please leave orders for HHPT/OT with face to face documentation for Medicare.  RN Case Manager will be happy to arrange services.    Expected Discharge Date:  07/05/18               Expected Discharge Plan:  Home w Home Health Services  In-House Referral:     Discharge planning Services  CM Consult  Post Acute Care Choice:  Home Health Choice offered to:  Patient  DME Arranged:    DME Agency:     HH Arranged:  PT, OT HH Agency:  [facility]  Status of Service:  Completed, signed off  If discussed at Long Length of Stay Meetings, dates discussed:    Additional Comments:  07/05/18 J. Izaiyah Kleinman, RN, BSN  Pt medically stable for discharge home today.  She states she has friends who will be able to intermittently assist her at home.  PT/OT recommending HH follow up; referral to [facility] home care per pt choice.  Start of care 24-48h post dc date.  No DME needs, per pt.    [name], RN, BSN  Trauma/Neuro ICU Case Manager [phone]

## 2019-01-09 ENCOUNTER — Other Ambulatory Visit: Payer: Self-pay

## 2019-01-09 ENCOUNTER — Encounter (HOSPITAL_COMMUNITY)
Admission: RE | Admit: 2019-01-09 | Discharge: 2019-01-09 | Disposition: A | Discharge status: Home / Self Care | Payer: Medicare Other | Source: Ambulatory Visit | Attending: Orthopedic Surgery | Admitting: Orthopedic Surgery

## 2019-01-09 ENCOUNTER — Encounter (HOSPITAL_COMMUNITY): Payer: Self-pay

## 2019-01-09 DIAGNOSIS — M7071 Other bursitis of hip, right hip: Secondary | ICD-10-CM | POA: Insufficient documentation

## 2019-01-09 DIAGNOSIS — S76011A Strain of muscle, fascia and tendon of right hip, initial encounter: Secondary | ICD-10-CM | POA: Insufficient documentation

## 2019-01-09 DIAGNOSIS — Z01812 Encounter for preprocedural laboratory examination: Secondary | ICD-10-CM | POA: Insufficient documentation

## 2019-01-09 DIAGNOSIS — Z20828 Contact with and (suspected) exposure to other viral communicable diseases: Secondary | ICD-10-CM | POA: Insufficient documentation

## 2019-01-09 DIAGNOSIS — X58XXXA Exposure to other specified factors, initial encounter: Secondary | ICD-10-CM | POA: Insufficient documentation

## 2019-01-09 HISTORY — DX: Personal history of diseases of the blood and blood-forming organs and certain disorders involving the immune mechanism: Z86.2

## 2019-01-09 LAB — CBC
HCT: 40.5 % (ref 36.0–46.0)
Hemoglobin: 12.8 g/dL (ref 12.0–15.0)
MCH: 29 pg (ref 26.0–34.0)
MCHC: 31.6 g/dL (ref 30.0–36.0)
MCV: 91.8 fL (ref 80.0–100.0)
Platelets: 271 10*3/uL (ref 150–400)
RBC: 4.41 MIL/uL (ref 3.87–5.11)
RDW: 14.7 % (ref 11.5–15.5)
WBC: 6 10*3/uL (ref 4.0–10.5)
nRBC: 0 % (ref 0.0–0.2)

## 2019-01-09 LAB — BASIC METABOLIC PANEL
Anion gap: 10 (ref 5–15)
BUN: 21 mg/dL (ref 8–23)
CO2: 22 mmol/L (ref 22–32)
Calcium: 8.9 mg/dL (ref 8.9–10.3)
Chloride: 109 mmol/L (ref 98–111)
Creatinine, Ser: 0.74 mg/dL (ref 0.44–1.00)
GFR calc Af Amer: >60 mL/min (ref >60)
GFR calc non Af Amer: >60 mL/min (ref >60)
Glucose, Bld: 102 mg/dL — ABNORMAL HIGH (ref 70–99)
Potassium: 3.8 mmol/L (ref 3.5–5.1)
Sodium: 141 mmol/L (ref 135–145)

## 2019-01-09 MED ORDER — ENSURE PRE-SURGERY PO LIQD
296.0000 mL | Freq: Once | ORAL | Status: DC
  Filled 2019-01-09: qty 296

## 2019-01-09 NOTE — Progress Notes (Signed)
ekg 06-22-18 epic

## 2019-01-09 NOTE — Progress Notes (Signed)
SPOKE W/  Jana Half     SCREENING SYMPTOMS OF COVID 19:   COUGH--No  RUNNY NOSE--- No  SORE THROAT---No  NASAL CONGESTION----No  SNEEZING----No  SHORTNESS OF BREATH---No  DIFFICULTY BREATHING---No  TEMP >100.0 -----No  UNEXPLAINED BODY ACHES------No  CHILLS -------- No  HEADACHES ---------No  LOSS OF SMELL/ TASTE --------No    HAVE YOU OR ANY FAMILY MEMBER TRAVELLED PAST 14 DAYS OUT OF THE   COUNTY---No STATE----No COUNTRY----No  HAVE YOU OR ANY FAMILY MEMBER BEEN EXPOSED TO ANYONE WITH COVID 19? No

## 2019-01-09 NOTE — Patient Instructions (Addendum)
DUE TO COVID-19 ONLY ONE VISITOR IS ALLOWED TO VISIT DURING VISITOR HOURS ONLY!!   COVID SWAB TESTING MUST BE COMPLETED ON: Thursday, Aug. 6, 2020 at 2:00PM   [address], [place] -[facility] enter pre surgical testing line (Must self quarantine after testing. Follow instructions on handout.)             Your procedure is scheduled on: Monday, Aug. 10, 2020   [facility]    Report to admitting at 10:40 AM   Call this number if you have problems the morning of surgery [phone]   Do not eat food :After Midnight.   May have liquids until 9:40AM day of surgery   CLEAR LIQUID DIET  Foods Allowed                                                                     Foods Excluded  Water, Black Coffee and tea, regular and decaf                             liquids that you cannot  Plain Jell-O in any flavor  (No red)                                           see through such as: Fruit ices (not with fruit pulp)                                     milk, soups, orange juice  Iced Popsicles (No red)                                    All solid food Carbonated beverages, regular and diet                                    Apple juices Sports drinks like Gatorade (No red) Lightly seasoned clear broth or consume(fat free) Sugar, honey syrup  Sample Menu Breakfast                                Lunch                                     Supper Cranberry juice                    Beef broth                            Chicken broth Jell-O  Grape juice                           Apple juice Coffee or tea                        Jell-O                                      Popsicle                                                Coffee or tea                        Coffee or tea   Complete one Ensure drink the morning of surgery at 9:40AM the day of surgery.   Brush your teeth the morning of  surgery.   Do NOT smoke after Midnight   Take these medicines the morning of surgery with A SIP OF WATER: Sertaline  DO NOT TAKE ANY DIABETIC MEDICATIONS DAY OF YOUR SURGERY                               You may not have any metal on your body including hair pins, jewelry, and body piercings             Do not wear make-up, lotions, powders, perfumes/cologne, or deodorant             Do not wear nail polish.  Do not shave  48 hours prior to surgery.               Do not bring valuables to the hospital. [place].   Contacts, dentures or bridgework may not be worn into surgery.   Bring small overnight bag day of surgery.    Special Instructions: Bring a copy of your healthcare power of attorney and living will documents         the day of surgery if you haven't scanned them in before.              Please read over the following fact sheets you were given:  [facility] - Preparing for Surgery Before surgery, you can play an important role.  Because skin is not sterile, your skin needs to be as free of germs as possible.  You can reduce the number of germs on your skin by washing with CHG (chlorahexidine gluconate) soap before surgery.  CHG is an antiseptic cleaner which kills germs and bonds with the skin to continue killing germs even after washing. Please DO NOT use if you have an allergy to CHG or antibacterial soaps.  If your skin becomes reddened/irritated stop using the CHG and inform your nurse when you arrive at Short Stay. Do not shave (including legs and underarms) for at least 48 hours prior to the first CHG shower.  You may shave your face/neck.  Please follow these instructions carefully:  1.  Shower with CHG Soap the night before surgery and the  morning of  surgery.  2.  If you choose to wash your hair, wash your hair first as usual with your normal  shampoo.  3.  After you shampoo, rinse your hair and body thoroughly to  remove the shampoo.                             4.  Use CHG as you would any other liquid soap.  You can apply chg directly to the skin and wash.  Gently with a scrungie or clean washcloth.  5.  Apply the CHG Soap to your body ONLY FROM THE NECK DOWN.   Do   not use on face/ open                           Wound or open sores. Avoid contact with eyes, ears mouth and   genitals (private parts).                       Wash face,  Genitals (private parts) with your normal soap.             6.  Wash thoroughly, paying special attention to the area where your    surgery  will be performed.  7.  Thoroughly rinse your body with warm water from the neck down.  8.  DO NOT shower/wash with your normal soap after using and rinsing off the CHG Soap.                9.  Pat yourself dry with a clean towel.            10.  Wear clean pajamas.            11.  Place clean sheets on your bed the night of your first shower and do not  sleep with pets. Day of Surgery : Do not apply any lotions/deodorants the morning of surgery.  Please wear clean clothes to the hospital/surgery center.  FAILURE TO FOLLOW THESE INSTRUCTIONS MAY RESULT IN THE CANCELLATION OF YOUR SURGERY  PATIENT SIGNATURE_________________________________  NURSE SIGNATURE__________________________________  ________________________________________________________________________  Rogelia MireIncentive Spirometer  An incentive spirometer is a tool that can help keep your lungs clear and active. This tool measures how well you are filling your lungs with each breath. Taking long deep breaths may help reverse or decrease the chance of developing breathing (pulmonary) problems (especially infection) following:  A long period of time when you are unable to move or be active. BEFORE THE PROCEDURE   If the spirometer includes an indicator to show your best effort, your nurse or respiratory therapist will set it to a desired goal.  If possible, sit up straight or  lean slightly forward. Try not to slouch.  Hold the incentive spirometer in an upright position. INSTRUCTIONS FOR USE  1. Sit on the edge of your bed if possible, or sit up as far as you can in bed or on a chair. 2. Hold the incentive spirometer in an upright position. 3. Breathe out normally. 4. Place the mouthpiece in your mouth and seal your lips tightly around it. 5. Breathe in slowly and as deeply as possible, raising the piston or the ball toward the top of the column. 6. Hold your breath for 3-5 seconds or for as long as possible. Allow the piston or ball to fall to the bottom of the column. 7. Remove the mouthpiece  from your mouth and breathe out normally. 8. Rest for a few seconds and repeat Steps 1 through 7 at least 10 times every 1-2 hours when you are awake. Take your time and take a few normal breaths between deep breaths. 9. The spirometer may include an indicator to show your best effort. Use the indicator as a goal to work toward during each repetition. 10. After each set of 10 deep breaths, practice coughing to be sure your lungs are clear. If you have an incision (the cut made at the time of surgery), support your incision when coughing by placing a pillow or rolled up towels firmly against it. Once you are able to get out of bed, walk around indoors and cough well. You may stop using the incentive spirometer when instructed by your caregiver.  RISKS AND COMPLICATIONS  Take your time so you do not get dizzy or light-headed.  If you are in pain, you may need to take or ask for pain medication before doing incentive spirometry. It is harder to take a deep breath if you are having pain. AFTER USE  Rest and breathe slowly and easily.  It can be helpful to keep track of a log of your progress. Your caregiver can provide you with a simple table to help with this. If you are using the spirometer at home, follow these instructions: SEEK MEDICAL CARE IF:   You are having  difficultly using the spirometer.  You have trouble using the spirometer as often as instructed.  Your pain medication is not giving enough relief while using the spirometer.  You develop fever of 100.5 F (38.1 C) or higher. SEEK IMMEDIATE MEDICAL CARE IF:   You cough up bloody sputum that had not been present before.  You develop fever of 102 F (38.9 C) or greater.  You develop worsening pain at or near the incision site. MAKE SURE YOU:   Understand these instructions.  Will watch your condition.  Will get help right away if you are not doing well or get worse. Document Released: 10/03/2006 Document Revised: 08/15/2011 Document Reviewed: 12/04/2006 [facility] Patient Information 2014 [employer] [place].   ________________________________________________________________________

## 2019-01-10 ENCOUNTER — Other Ambulatory Visit (HOSPITAL_COMMUNITY)
Admission: RE | Admit: 2019-01-10 | Discharge: 2019-01-10 | Disposition: A | Discharge status: Home / Self Care | Payer: Medicare Other | Source: Ambulatory Visit | Attending: Orthopedic Surgery | Admitting: Orthopedic Surgery

## 2019-01-10 DIAGNOSIS — Z01812 Encounter for preprocedural laboratory examination: Secondary | ICD-10-CM | POA: Diagnosis not present

## 2019-01-10 LAB — SARS CORONAVIRUS 2 (TAT 6-24 HRS): SARS Coronavirus 2: NEGATIVE

## 2019-01-13 MED ORDER — BUPIVACAINE LIPOSOME 1.3 % IJ SUSP
20.0000 mL | INTRAMUSCULAR | Status: DC
  Filled 2019-01-13: qty 20

## 2019-01-14 ENCOUNTER — Ambulatory Visit (HOSPITAL_COMMUNITY): Payer: Medicare Other | Admitting: Physician Assistant

## 2019-01-14 ENCOUNTER — Other Ambulatory Visit: Payer: Self-pay

## 2019-01-14 ENCOUNTER — Observation Stay (HOSPITAL_COMMUNITY)
Admission: RE | Admit: 2019-01-14 | Discharge: 2019-01-15 | Disposition: A | Discharge status: Home / Self Care | Payer: Medicare Other | Attending: Orthopedic Surgery | Admitting: Orthopedic Surgery

## 2019-01-14 ENCOUNTER — Ambulatory Visit (HOSPITAL_COMMUNITY): Payer: Medicare Other | Admitting: Anesthesiology

## 2019-01-14 ENCOUNTER — Encounter (HOSPITAL_COMMUNITY): Payer: Self-pay | Admitting: Emergency Medicine

## 2019-01-14 ENCOUNTER — Encounter (HOSPITAL_COMMUNITY)
Admission: RE | Disposition: A | Discharge status: Home / Self Care | Payer: Self-pay | Source: Home / Self Care | Attending: Orthopedic Surgery

## 2019-01-14 DIAGNOSIS — F419 Anxiety disorder, unspecified: Secondary | ICD-10-CM | POA: Insufficient documentation

## 2019-01-14 DIAGNOSIS — Z791 Long term (current) use of non-steroidal anti-inflammatories (NSAID): Secondary | ICD-10-CM | POA: Insufficient documentation

## 2019-01-14 DIAGNOSIS — E7800 Pure hypercholesterolemia, unspecified: Secondary | ICD-10-CM | POA: Diagnosis not present

## 2019-01-14 DIAGNOSIS — M7071 Other bursitis of hip, right hip: Secondary | ICD-10-CM | POA: Diagnosis present

## 2019-01-14 DIAGNOSIS — M7061 Trochanteric bursitis, right hip: Principal | ICD-10-CM | POA: Insufficient documentation

## 2019-01-14 DIAGNOSIS — M1990 Unspecified osteoarthritis, unspecified site: Secondary | ICD-10-CM | POA: Insufficient documentation

## 2019-01-14 DIAGNOSIS — S76312A Strain of muscle, fascia and tendon of the posterior muscle group at thigh level, left thigh, initial encounter: Secondary | ICD-10-CM | POA: Diagnosis not present

## 2019-01-14 DIAGNOSIS — Z7982 Long term (current) use of aspirin: Secondary | ICD-10-CM | POA: Insufficient documentation

## 2019-01-14 DIAGNOSIS — X58XXXA Exposure to other specified factors, initial encounter: Secondary | ICD-10-CM | POA: Insufficient documentation

## 2019-01-14 DIAGNOSIS — Z79890 Hormone replacement therapy (postmenopausal): Secondary | ICD-10-CM | POA: Diagnosis not present

## 2019-01-14 DIAGNOSIS — Z79899 Other long term (current) drug therapy: Secondary | ICD-10-CM | POA: Diagnosis not present

## 2019-01-14 DIAGNOSIS — I10 Essential (primary) hypertension: Secondary | ICD-10-CM | POA: Insufficient documentation

## 2019-01-14 HISTORY — PX: OPEN SURGICAL REPAIR OF GLUTEAL TENDON: SHX5995

## 2019-01-14 LAB — CBC
HCT: 37.1 % (ref 36.0–46.0)
Hemoglobin: 11.7 g/dL — ABNORMAL LOW (ref 12.0–15.0)
MCH: 29.2 pg (ref 26.0–34.0)
MCHC: 31.5 g/dL (ref 30.0–36.0)
MCV: 92.5 fL (ref 80.0–100.0)
Platelets: 232 10*3/uL (ref 150–400)
RBC: 4.01 MIL/uL (ref 3.87–5.11)
RDW: 14.7 % (ref 11.5–15.5)
WBC: 7.2 10*3/uL (ref 4.0–10.5)
nRBC: 0 % (ref 0.0–0.2)

## 2019-01-14 LAB — CREATININE, SERUM
Creatinine, Ser: 0.58 mg/dL (ref 0.44–1.00)
GFR calc Af Amer: >60 mL/min (ref >60)
GFR calc non Af Amer: >60 mL/min (ref >60)

## 2019-01-14 SURGERY — REPAIR, TENDON, GLUTEUS MEDIUS, OPEN
Anesthesia: General | Site: Hip | Laterality: Right

## 2019-01-14 MED ORDER — SERTRALINE HCL 50 MG PO TABS
50.0000 mg | ORAL_TABLET | Freq: Every day | ORAL | Status: DC
  Administered 2019-01-15: 50 mg via ORAL
  Filled 2019-01-14: qty 1

## 2019-01-14 MED ORDER — LIDOCAINE 2% (20 MG/ML) 5 ML SYRINGE
INTRAMUSCULAR | Status: DC | PRN
  Administered 2019-01-14: 80 mg via INTRAVENOUS

## 2019-01-14 MED ORDER — ENALAPRIL MALEATE 10 MG PO TABS
20.0000 mg | ORAL_TABLET | Freq: Two times a day (BID) | ORAL | Status: DC
  Administered 2019-01-14: 20 mg via ORAL
  Filled 2019-01-14 (×2): qty 2

## 2019-01-14 MED ORDER — BUPIVACAINE HCL (PF) 0.25 % IJ SOLN
INTRAMUSCULAR | Status: DC | PRN
  Administered 2019-01-14: 30 mL

## 2019-01-14 MED ORDER — METOCLOPRAMIDE HCL 5 MG PO TABS: 5.0000 mg | ORAL_TABLET | Freq: Three times a day (TID) | ORAL | Status: DC | PRN

## 2019-01-14 MED ORDER — ENOXAPARIN SODIUM 40 MG/0.4ML ~~LOC~~ SOLN
40.0000 mg | SUBCUTANEOUS | Status: DC
  Administered 2019-01-15: 40 mg via SUBCUTANEOUS
  Filled 2019-01-14: qty 0.4

## 2019-01-14 MED ORDER — PRAVASTATIN SODIUM 20 MG PO TABS: 20.0000 mg | ORAL_TABLET | Freq: Every day | ORAL | Status: DC

## 2019-01-14 MED ORDER — FENTANYL CITRATE (PF) 100 MCG/2ML IJ SOLN: 25.0000 ug | INTRAMUSCULAR | Status: DC | PRN

## 2019-01-14 MED ORDER — LACTATED RINGERS IV SOLN
INTRAVENOUS | Status: DC
  Administered 2019-01-14 (×2): via INTRAVENOUS

## 2019-01-14 MED ORDER — ACETAMINOPHEN 325 MG PO TABS: 325.0000 mg | ORAL_TABLET | Freq: Four times a day (QID) | ORAL | Status: DC | PRN

## 2019-01-14 MED ORDER — OXYCODONE HCL 5 MG PO TABS: 5.0000 mg | ORAL_TABLET | Freq: Once | ORAL | Status: DC | PRN

## 2019-01-14 MED ORDER — ACETAMINOPHEN 160 MG/5ML PO SOLN: 325.0000 mg | ORAL | Status: DC | PRN

## 2019-01-14 MED ORDER — OXYCODONE HCL 5 MG PO TABS
5.0000 mg | ORAL_TABLET | ORAL | Status: DC | PRN
  Administered 2019-01-14: 5 mg via ORAL
  Administered 2019-01-15: 10 mg via ORAL
  Filled 2019-01-14 (×3): qty 2

## 2019-01-14 MED ORDER — PROPOFOL 10 MG/ML IV BOLUS
INTRAVENOUS | Status: DC | PRN
  Administered 2019-01-14: 200 mg via INTRAVENOUS

## 2019-01-14 MED ORDER — FENTANYL CITRATE (PF) 100 MCG/2ML IJ SOLN
INTRAMUSCULAR | Status: AC
  Administered 2019-01-14: 50 ug via INTRAVENOUS
  Filled 2019-01-14: qty 2

## 2019-01-14 MED ORDER — METHOCARBAMOL 500 MG PO TABS
500.0000 mg | ORAL_TABLET | Freq: Four times a day (QID) | ORAL | Status: DC | PRN
  Administered 2019-01-14: 500 mg via ORAL
  Filled 2019-01-14: qty 1

## 2019-01-14 MED ORDER — ONDANSETRON HCL 4 MG/2ML IJ SOLN
INTRAMUSCULAR | Status: DC | PRN
  Administered 2019-01-14: 4 mg via INTRAVENOUS

## 2019-01-14 MED ORDER — CEFAZOLIN SODIUM-DEXTROSE 2-4 GM/100ML-% IV SOLN
2.0000 g | INTRAVENOUS | Status: AC
  Administered 2019-01-14: 2 g via INTRAVENOUS
  Filled 2019-01-14: qty 100

## 2019-01-14 MED ORDER — OXYCODONE HCL 5 MG/5ML PO SOLN: 5.0000 mg | Freq: Once | ORAL | Status: DC | PRN

## 2019-01-14 MED ORDER — EPHEDRINE SULFATE-NACL 50-0.9 MG/10ML-% IV SOSY
PREFILLED_SYRINGE | INTRAVENOUS | Status: DC | PRN
  Administered 2019-01-14: 15 mg via INTRAVENOUS
  Administered 2019-01-14: 10 mg via INTRAVENOUS

## 2019-01-14 MED ORDER — SODIUM CHLORIDE 0.9 % IR SOLN
Status: DC | PRN
  Administered 2019-01-14: 1000 mL

## 2019-01-14 MED ORDER — SODIUM CHLORIDE 0.9 % IV SOLN: INTRAVENOUS | Status: DC

## 2019-01-14 MED ORDER — DOCUSATE SODIUM 100 MG PO CAPS
100.0000 mg | ORAL_CAPSULE | Freq: Two times a day (BID) | ORAL | Status: DC
  Administered 2019-01-14 – 2019-01-15 (×2): 100 mg via ORAL
  Filled 2019-01-14 (×2): qty 1

## 2019-01-14 MED ORDER — CHLORHEXIDINE GLUCONATE 4 % EX LIQD: 60.0000 mL | Freq: Once | CUTANEOUS | Status: DC

## 2019-01-14 MED ORDER — BUPIVACAINE HCL (PF) 0.25 % IJ SOLN
INTRAMUSCULAR | Status: AC
  Filled 2019-01-14: qty 30

## 2019-01-14 MED ORDER — POVIDONE-IODINE 10 % EX SWAB
2.0000 "application " | Freq: Once | CUTANEOUS | Status: AC
  Administered 2019-01-14: 2 via TOPICAL

## 2019-01-14 MED ORDER — ACETAMINOPHEN 10 MG/ML IV SOLN
1000.0000 mg | Freq: Four times a day (QID) | INTRAVENOUS | Status: DC
  Administered 2019-01-14: 1000 mg via INTRAVENOUS
  Filled 2019-01-14: qty 100

## 2019-01-14 MED ORDER — ONDANSETRON HCL 4 MG/2ML IJ SOLN
4.0000 mg | Freq: Four times a day (QID) | INTRAMUSCULAR | Status: DC | PRN
  Administered 2019-01-15: 4 mg via INTRAVENOUS
  Filled 2019-01-14: qty 2

## 2019-01-14 MED ORDER — PROGESTERONE MICRONIZED 100 MG PO CAPS
100.0000 mg | ORAL_CAPSULE | Freq: Every day | ORAL | Status: DC
  Administered 2019-01-14: 100 mg via ORAL
  Filled 2019-01-14: qty 1

## 2019-01-14 MED ORDER — MEPERIDINE HCL 50 MG/ML IJ SOLN: 6.2500 mg | INTRAMUSCULAR | Status: DC | PRN

## 2019-01-14 MED ORDER — CEFAZOLIN SODIUM-DEXTROSE 2-4 GM/100ML-% IV SOLN
2.0000 g | Freq: Once | INTRAVENOUS | Status: AC
  Administered 2019-01-14: 2 g via INTRAVENOUS
  Filled 2019-01-14: qty 100

## 2019-01-14 MED ORDER — ACETAMINOPHEN 325 MG PO TABS: 325.0000 mg | ORAL_TABLET | ORAL | Status: DC | PRN

## 2019-01-14 MED ORDER — FENTANYL CITRATE (PF) 100 MCG/2ML IJ SOLN
25.0000 ug | INTRAMUSCULAR | Status: AC | PRN
  Administered 2019-01-14 (×3): 25 ug via INTRAVENOUS
  Administered 2019-01-14: 16:00:00 50 ug via INTRAVENOUS
  Administered 2019-01-14: 25 ug via INTRAVENOUS
  Administered 2019-01-14: 50 ug via INTRAVENOUS

## 2019-01-14 MED ORDER — DONEPEZIL HCL 10 MG PO TABS
5.0000 mg | ORAL_TABLET | Freq: Every day | ORAL | Status: DC
  Administered 2019-01-15: 5 mg via ORAL
  Filled 2019-01-14: qty 1

## 2019-01-14 MED ORDER — METOCLOPRAMIDE HCL 5 MG/ML IJ SOLN
5.0000 mg | Freq: Three times a day (TID) | INTRAMUSCULAR | Status: DC | PRN
  Administered 2019-01-15: 10 mg via INTRAVENOUS
  Filled 2019-01-14: qty 2

## 2019-01-14 MED ORDER — ONDANSETRON HCL 4 MG/2ML IJ SOLN: 4.0000 mg | Freq: Once | INTRAMUSCULAR | Status: DC | PRN

## 2019-01-14 MED ORDER — OXYCODONE HCL 5 MG PO TABS
10.0000 mg | ORAL_TABLET | ORAL | Status: DC | PRN
  Administered 2019-01-15: 10 mg via ORAL

## 2019-01-14 MED ORDER — ONDANSETRON HCL 4 MG PO TABS: 4.0000 mg | ORAL_TABLET | Freq: Four times a day (QID) | ORAL | Status: DC | PRN

## 2019-01-14 MED ORDER — FENTANYL CITRATE (PF) 100 MCG/2ML IJ SOLN
INTRAMUSCULAR | Status: DC | PRN
  Administered 2019-01-14 (×2): 50 ug via INTRAVENOUS

## 2019-01-14 MED ORDER — FENTANYL CITRATE (PF) 100 MCG/2ML IJ SOLN
INTRAMUSCULAR | Status: AC
  Administered 2019-01-14: 25 ug via INTRAVENOUS
  Filled 2019-01-14: qty 2

## 2019-01-14 SURGICAL SUPPLY — 47 items
ANCHOR SUPER QUICK (Anchor) ×2 IMPLANT
BAG ZIPLOCK 12X15 (MISCELLANEOUS) IMPLANT
BIT DRILL 2.8X128 (BIT) IMPLANT
BLADE EXTENDED COATED 6.5IN (ELECTRODE) IMPLANT
CLSR STERI-STRIP ANTIMIC 1/2X4 (GAUZE/BANDAGES/DRESSINGS) ×2 IMPLANT
COVER SURGICAL LIGHT HANDLE (MISCELLANEOUS) ×2 IMPLANT
COVER WAND RF STERILE (DRAPES) IMPLANT
DRAPE INCISE IOBAN 66X45 STRL (DRAPES) ×2 IMPLANT
DRAPE ORTHO SPLIT 77X108 STRL (DRAPES) ×2
DRAPE POUCH INSTRU U-SHP 10X18 (DRAPES) ×2 IMPLANT
DRAPE SURG ORHT 6 SPLT 77X108 (DRAPES) ×2 IMPLANT
DRAPE U-SHAPE 47X51 STRL (DRAPES) ×2 IMPLANT
DRSG ADAPTIC 3X8 NADH LF (GAUZE/BANDAGES/DRESSINGS) ×2 IMPLANT
DRSG MEPILEX BORDER 4X4 (GAUZE/BANDAGES/DRESSINGS) ×2 IMPLANT
DRSG MEPILEX BORDER 4X8 (GAUZE/BANDAGES/DRESSINGS) ×2 IMPLANT
DURAPREP 26ML APPLICATOR (WOUND CARE) ×2 IMPLANT
ELECT REM PT RETURN 15FT ADLT (MISCELLANEOUS) ×2 IMPLANT
EVACUATOR 1/8 PVC DRAIN (DRAIN) IMPLANT
FACESHIELD WRAPAROUND (MASK) ×4 IMPLANT
GAUZE SPONGE 4X4 12PLY STRL (GAUZE/BANDAGES/DRESSINGS) ×2 IMPLANT
GLOVE BIO SURGEON STRL SZ7 (GLOVE) ×2 IMPLANT
GLOVE BIO SURGEON STRL SZ8 (GLOVE) ×2 IMPLANT
GLOVE BIOGEL PI IND STRL 7.0 (GLOVE) ×1 IMPLANT
GLOVE BIOGEL PI IND STRL 8 (GLOVE) ×2 IMPLANT
GLOVE BIOGEL PI INDICATOR 7.0 (GLOVE) ×1
GLOVE BIOGEL PI INDICATOR 8 (GLOVE) ×2
GOWN STRL REUS W/TWL LRG LVL3 (GOWN DISPOSABLE) ×4 IMPLANT
KIT BASIN OR (CUSTOM PROCEDURE TRAY) ×2 IMPLANT
KIT TURNOVER KIT A (KITS) IMPLANT
MANIFOLD NEPTUNE II (INSTRUMENTS) ×2 IMPLANT
NDL SAFETY ECLIPSE 18X1.5 (NEEDLE) ×2 IMPLANT
NEEDLE HYPO 18GX1.5 SHARP (NEEDLE) ×2
NEEDLE MA TROC 1/2 (NEEDLE) IMPLANT
NS IRRIG 1000ML POUR BTL (IV SOLUTION) ×2 IMPLANT
PACK TOTAL JOINT (CUSTOM PROCEDURE TRAY) ×2 IMPLANT
PASSER SUT SWANSON 36MM LOOP (INSTRUMENTS) IMPLANT
PROTECTOR NERVE ULNAR (MISCELLANEOUS) ×2 IMPLANT
STAPLER VISISTAT 35W (STAPLE) IMPLANT
STRIP CLOSURE SKIN 1/2X4 (GAUZE/BANDAGES/DRESSINGS) ×4 IMPLANT
SUT ETHIBOND NAB CT1 #1 30IN (SUTURE) IMPLANT
SUT MNCRL AB 4-0 PS2 18 (SUTURE) ×2 IMPLANT
SUT STRATAFIX 0 PDS 27 VIOLET (SUTURE)
SUT VIC AB 2-0 CT1 27 (SUTURE) ×3
SUT VIC AB 2-0 CT1 TAPERPNT 27 (SUTURE) ×3 IMPLANT
SUTURE STRATFX 0 PDS 27 VIOLET (SUTURE) IMPLANT
SYR 30ML LL (SYRINGE) ×4 IMPLANT
TOWEL OR 17X26 10 PK STRL BLUE (TOWEL DISPOSABLE) ×4 IMPLANT

## 2019-01-14 NOTE — H&P (Signed)
CC- [name] is a 78 y.o. female who presents with right hip pain  Hip Pain: Patient complains of right hip pain. Onset of the symptoms was several months ago. Inciting event: none. Current symptoms include lateral hip pain at rest and with activity. Associated symptoms: none. Aggravating symptoms: laying on right side. Evaluation to date: plain films, which were normal and MRI with gluteal tendon tear.  Treatment to date: home exercise program, which has been not very effective and cortisone injection with partial short term relief.  Past Medical History:  Diagnosis Date  . Anxiety   . Arthritis   . Bartholin's gland abscess 04/2017   Left, incised and drained  . High cholesterol   . History of iron deficiency anemia   . Hypertension     Past Surgical History:  Procedure Laterality Date  . BREAST EXCISIONAL BIOPSY Right    x 2  . BUNIONECTOMY    . CHOLECYSTECTOMY    . COLONOSCOPY    . LUMBAR FUSION  2011  . right knee replacement   2011  . TONSILLECTOMY AND ADENOIDECTOMY    . 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 Authorizing Provider  aspirin EC 81 MG tablet Take 81 mg by mouth at bedtime.   Yes [provider]  Cholecalciferol (VITAMIN D3) 125 MCG (5000 UT) CAPS Take 5,000 Units by mouth daily.   Yes [provider]  clobetasol ointment (TEMOVATE) 0.05 % Apply topically 2 (two) times daily. As needed for itching 04/30/18  Yes Fontaine, Belinda Block, MD  donepezil (ARICEPT) 5 MG tablet Take 5 mg by mouth daily. 12/03/15  Yes [provider]  enalapril (VASOTEC) 20 MG tablet Take 20 mg by mouth 2 (two) times daily.    Yes [provider]  estradiol (ESTRACE) 0.5 MG tablet Take 1 tablet (0.5 mg  total) by mouth daily. 04/30/18  Yes Fontaine, Belinda Block, MD  meloxicam (MOBIC) 15 MG tablet Take 15 mg by mouth daily.   Yes [provider]  methocarbamol (ROBAXIN) 500 MG tablet Take 1 tablet (500 mg total) by mouth every 6 (six) hours as needed for muscle spasms. 07/05/18  Yes Kristeen Miss, MD  oxyCODONE-acetaminophen (PERCOCET/ROXICET) 5-325 MG tablet Take 1-2 tablets by mouth every 3 (three) hours as needed for moderate pain or severe pain. 07/05/18  Yes Kristeen Miss, MD  pravastatin (PRAVACHOL) 20 MG tablet Take 20 mg by mouth daily.     Yes [provider]  progesterone (PROMETRIUM) 100 MG capsule Take 1 capsule (100 mg total) by mouth daily. Patient taking differently: Take 100 mg by mouth at bedtime.  04/30/18  Yes Fontaine, Belinda Block, MD  sertraline (ZOLOFT) 50 MG tablet Take 50 mg by mouth daily.     Yes [provider]  celecoxib (CELEBREX) 200 MG capsule Take 1 capsule (200 mg total) by mouth daily. Patient not taking: Reported on 01/07/2019 07/05/18   Kristeen Miss, MD  gabapentin (NEURONTIN) 100 MG capsule Take 2 capsules (200 mg total) by mouth 3 (three) times daily. Patient not taking: Reported on 01/07/2019 07/05/18   Kristeen Miss, MD    Physical Examination: General appearance - alert, well appearing, and in no distress Mental status - alert, oriented to person, place, and  time Chest - clear to auscultation, no wheezes, rales or rhonchi, symmetric air entry Heart - normal rate, regular rhythm, normal S1, S2, no murmurs, rubs, clicks or gallops Abdomen - soft, nontender, nondistended, no masses or organomegaly Neurological - alert, oriented, normal speech, no focal findings or movement disorder noted  A right hip exam was performed. GENERAL: no acute distress SKIN: intact SWELLING: none WARMTH: no warmth TENDERNESS: maximal at greater trochanter ROM: normal STRENGTH: normal except 4/5 hip abduction GAIT: antalgic  ASSESSMENT: Right hip bursitis  with gluteal tendon tear  Plan Right hip bursectomy and gluteal tendon repair  Gus RankinFrank V. Chrissi Crow, MD    01/14/2019, 10:40 AM

## 2019-01-14 NOTE — Discharge Instructions (Signed)
° °Dr. Frank Aluisio °[facility] °[facility] °[address] °[place], [place] [address] °[phone] ° °Bursectomy / Gluteal Tendon Repair Discharge Instructions ° °HOME CARE INSTRUCTIONS  °Remove items at home which could result in a fall. This includes throw rugs or furniture in walking pathways.  °· ICE to the affected hip every three hours for 30 minutes at a time and then as needed for pain and swelling.  Continue to use ice on the hip for pain and swelling from surgery. You may notice swelling that will progress down to the foot and ankle.  This is normal after surgery.  Elevate the leg when you are not up walking on it.   °· Continue to use the breathing machine which will help keep your temperature down.  It is common for your temperature to cycle up and down following surgery, especially at night when you are not up moving around and exerting yourself.  The breathing machine keeps your lungs expanded and your temperature down. °Sit on high chairs which makes it easier to stand.  °Sit on chairs with arms. Use the chair arms to help push yourself up when arising.  °· No Active Abduction of the leg (No pulling leg out to the side away from the body). °· Use your walker for first several days until comfortable ambulating. ° °DIET °You may resume your previous home diet once your are discharged from the hospital. ° °DRESSING / WOUND CARE / SHOWERING °You may change your dressing 3-5 days after surgery.  Then change the dressing every day with sterile gauze.  Please use good hand washing techniques before changing the dressing.  Do not use any lotions or creams on the incision until instructed by your surgeon. °You may start showering three days following surgery but do not submerge the incision under water.Just pat the incision dry and apply a dry gauze dressing on daily. °Change the surgical dressing daily and reapply a dry dressing each time. ° °ACTIVITY °Walk with your walker as  instructed. °Use walker as long as suggested by your caregivers. °Avoid periods of inactivity such as sitting longer than an hour when not asleep. This helps prevent blood clots.  °You may resume a sexual relationship in one month or when given the OK by your doctor.  °You may return to work once you are cleared by your doctor.  °Do not drive a car for 6 weeks or until released by you surgeon.  °Do not drive while taking narcotics. ° °WEIGHT BEARING °Weight bearing as tolerated with assist device (walker, cane, etc) as directed, use it as long as suggested by your surgeon or therapist, typically at least 4-6 weeks. ° °POSTOPERATIVE CONSTIPATION PROTOCOL °Constipation - defined medically as fewer than three stools per week and severe constipation as less than one stool per week. ° °One of the most common issues patients have following surgery is constipation.  Even if you have a regular bowel pattern at home, your normal regimen is likely to be disrupted due to multiple reasons following surgery.  Combination of anesthesia, postoperative narcotics, change in appetite and fluid intake all can affect your bowels.  In order to avoid complications following surgery, here are some recommendations in order to help you during your recovery period. ° °Colace (docusate) - Pick up an over-the-counter form of Colace or another stool softener and take twice a day as long as you are requiring postoperative pain medications.  Take with a full glass of water   daily.  If you experience loose stools or diarrhea, hold the colace until you stool forms back up.  If your symptoms do not get better within 1 week or if they get worse, check with your doctor. ° °Dulcolax (bisacodyl) - Pick up over-the-counter and take as directed by the product packaging as needed to assist with the movement of your bowels.  Take with a full glass of water.  Use this product as needed if not relieved by Colace only.  ° °MiraLax (polyethylene glycol) - Pick  up over-the-counter to have on hand.  MiraLax is a solution that will increase the amount of water in your bowels to assist with bowel movements.  Take as directed and can mix with a glass of water, juice, soda, coffee, or tea.  Take if you go more than two days without a movement. °Do not use MiraLax more than once per day. Call your doctor if you are still constipated or irregular after using this medication for 7 days in a row. ° °If you continue to have problems with postoperative constipation, please contact the office for further assistance and recommendations.  If you experience "the worst abdominal pain ever" or develop nausea or vomiting, please contact the office immediatly for further recommendations for treatment. ° °ITCHING ° If you experience itching with your medications, try taking only a single pain pill, or even half a pain pill at a time.  You can also use Benadryl over the counter for itching or also to help with sleep.  ° °TED HOSE STOCKINGS °Wear the elastic stockings on both legs for three weeks following surgery during the day but you may remove then at night for sleeping. ° °MEDICATIONS °See your medication summary on the “After Visit Summary” that the nursing staff will review with you prior to discharge.  You may have some home medications which will be placed on hold until you complete the course of blood thinner medication.  It is important for you to complete the blood thinner medication as prescribed by your surgeon.  Continue your approved medications as instructed at time of discharge. ° °PRECAUTIONS °If you experience chest pain or shortness of breath - call 911 immediately for transfer to the hospital emergency department.  °If you develop a fever greater that 101 F, purulent drainage from wound, increased redness or drainage from wound, foul odor from the wound/dressing, or calf pain - CONTACT YOUR SURGEON.   °                                                °FOLLOW-UP  APPOINTMENTS °Make sure you keep all of your appointments after your operation with your surgeon and caregivers. You should call the office at the above phone number and make an appointment for approximately two weeks after the date of your surgery or on the date instructed by your surgeon outlined in the "After Visit Summary". ° ° °Pick up stool softner and laxative for home use following surgery while on pain medications. °Do not submerge incision under water. °Please use good hand washing techniques while changing dressing each day. °May shower starting three days after surgery. °Please use a clean towel to pat the incision dry following showers. °Continue to use ice for pain and swelling after surgery. °Do not use any lotions or creams on the incision until instructed by your   surgeon. ° °

## 2019-01-14 NOTE — Evaluation (Signed)
Physical Therapy Evaluation Patient Details Name: [name] MRN: [id number] DOB: 07/03/1940 Today's Date: 01/14/2019   History of Present Illness  s/p Right hip bursectomy; gluteal tendon repair. PMH: L5-S1 with fixation, syncope, HTN, bil TKA  Clinical Impression  Pt admitted with above diagnosis. Pt currently with functional limitations due to the deficits listed below (see PT Problem List).  Anticipate steady progress, will see in am prior to d/c.   Pt will benefit from skilled PT to increase their independence and safety with mobility to allow discharge to the venue listed below.       Follow Up Recommendations Follow surgeon's recommendation for DC plan and follow-up therapies    Equipment Recommendations  None recommended by PT    Recommendations for Other Services       Precautions / Restrictions Precautions Precautions: Other (comment) Precaution Comments: no active abduction RLE Restrictions Weight Bearing Restrictions: No Other Position/Activity Restrictions: WBAT      Mobility  Bed Mobility Overal bed mobility: Needs Assistance Bed Mobility: Supine to Sit     Supine to sit: Min guard     General bed mobility comments: cues for no active abduction  Transfers Overall transfer level: Needs assistance Equipment used: Rolling walker (2 wheeled) Transfers: Sit to/from Stand Sit to Stand: Min guard         General transfer comment: cues for hand placement  Ambulation/Gait Ambulation/Gait assistance: Min assist;Min guard Gait Distance (Feet): 18 Feet Assistive device: Rolling walker (2 wheeled) Gait Pattern/deviations: Step-to pattern     General Gait Details: cues for sequence adn RW position  Stairs            Wheelchair Mobility    Modified Rankin (Stroke Patients Only)       Balance                                             Pertinent Vitals/Pain Pain Assessment: 0-10 Pain Score: 3  Pain Location: right  hip Pain Descriptors / Indicators: Discomfort Pain Intervention(s): Limited activity within patient's tolerance;Monitored during session;Premedicated before session;Repositioned;Ice applied    Home Living Family/patient expects to be discharged to:: Private residence   Available Help at Discharge: Friend(s);Available 24 hours/day Type of Home: House Home Access: Level entry     Home Layout: One level Home Equipment: Shower seat - built in;Bedside commode;Walker - 2 wheels;Walker - 4 wheels;Cane - single point      Prior Function Level of Independence: Independent               Hand Dominance        Extremity/Trunk Assessment   Upper Extremity Assessment Upper Extremity Assessment: Overall WFL for tasks assessed    Lower Extremity Assessment Lower Extremity Assessment: RLE deficits/detail RLE Deficits / Details: ankle and knee WFL, hip NT d/t precautions       Communication   Communication: No difficulties  Cognition Arousal/Alertness: Awake/alert Behavior During Therapy: WFL for tasks assessed/performed Overall Cognitive Status: Within Functional Limits for tasks assessed                                        General Comments      Exercises     Assessment/Plan    PT Assessment Patient needs continued PT services  PT Problem List Decreased strength;Decreased range of motion;Decreased activity tolerance;Decreased mobility;Pain;Decreased knowledge of precautions;Decreased knowledge of use of DME       PT Treatment Interventions DME instruction;Gait training;Stair training;Functional mobility training;Therapeutic activities;Patient/family education;Therapeutic exercise    PT Goals (Current goals can be found in the Care Plan section)  Acute Rehab PT Goals PT Goal Formulation: With patient Time For Goal Achievement: 01/21/19    Frequency 7X/week   Barriers to discharge        Co-evaluation               AM-PAC PT "6  Clicks" Mobility  Outcome Measure Help needed turning from your back to your side while in a flat bed without using bedrails?: A Little Help needed moving from lying on your back to sitting on the side of a flat bed without using bedrails?: A Little Help needed moving to and from a bed to a chair (including a wheelchair)?: A Little Help needed standing up from a chair using your arms (e.g., wheelchair or bedside chair)?: A Little Help needed to walk in hospital room?: A Little Help needed climbing 3-5 steps with a railing? : A Little 6 Click Score: 18    End of Session Equipment Utilized During Treatment: Gait belt Activity Tolerance: Patient tolerated treatment well Patient left: in chair;with call bell/phone within reach;with chair alarm set   PT Visit Diagnosis: Difficulty in walking, not elsewhere classified (R26.2)    Time: [id number] PT Time Calculation (min) (ACUTE ONLY): 17 min   Charges:   PT Evaluation $PT Eval Low Complexity: 1 Low          Drucilla Chaletara Jeffey Janssen, PT  Pager: [phone] Acute Rehab Dept [facility]): [phone]   01/14/2019   [facility] InstituteWILLIAMS,Elianys Conry 01/14/2019, 6:11 PM

## 2019-01-14 NOTE — Anesthesia Procedure Notes (Signed)
Procedure Name: LMA Insertion Date/Time: 01/14/2019 1:13 PM Performed by: [place] Maretta Overdorf C, CRNA Pre-anesthesia Checklist: Patient identified, Emergency Drugs available, Suction available and Patient being monitored Patient Re-evaluated:Patient Re-evaluated prior to induction Oxygen Delivery Method: Circle system utilized Preoxygenation: Pre-oxygenation with 100% oxygen Induction Type: IV induction Ventilation: Mask ventilation without difficulty LMA: LMA inserted LMA Size: 4.0 Number of attempts: 1 Airway Equipment and Method: Bite block Placement Confirmation: positive ETCO2 Tube secured with: Tape Dental Injury: Teeth and Oropharynx as per pre-operative assessment

## 2019-01-14 NOTE — Brief Op Note (Signed)
01/14/2019  2:03 PM  PATIENT:  [name]  78 y.o. female  PRE-OPERATIVE DIAGNOSIS:  right hip gluteal tendon tear  POST-OPERATIVE DIAGNOSIS:  right hip gluteal tendon tear  PROCEDURE:  Procedure(s) with comments: Right hip bursectomy; gluteal tendon repair (Right) - 12min  SURGEON:  Surgeon(s) and Role:    Gaynelle Arabian, MD - Primary  PHYSICIAN ASSISTANT:   ASSISTANTS: Theresa Duty, PA-C   ANESTHESIA:   general  EBL:  25 ml  BLOOD ADMINISTERED:none  DRAINS: Medium Hemovac in right hip  LOCAL MEDICATIONS USED:  MARCAINE     COUNTS:  YES  TOURNIQUET:  * No tourniquets in log *  DICTATION: .Other Dictation: Dictation Number [id number]  PLAN OF CARE: Admit for overnight observation  PATIENT DISPOSITION:  PACU - hemodynamically stable.

## 2019-01-14 NOTE — Anesthesia Postprocedure Evaluation (Signed)
Anesthesia Post Note  Patient: [name]  Procedure(s) Performed: Right hip bursectomy; gluteal tendon repair (Right Hip)     Patient location during evaluation: PACU Anesthesia Type: General Level of consciousness: awake and alert Pain management: pain level controlled Vital Signs Assessment: post-procedure vital signs reviewed and stable Respiratory status: spontaneous breathing, nonlabored ventilation, respiratory function stable and patient connected to nasal cannula oxygen Cardiovascular status: blood pressure returned to baseline and stable Postop Assessment: no apparent nausea or vomiting Anesthetic complications: no    Last Vitals:  Vitals:   01/14/19 1500 01/14/19 1515  BP: (!) 166/70 (!) 156/74  Pulse: 85 80  Resp: (!) 21 18  Temp:    SpO2: 100% 100%    Last Pain:  Vitals:   01/14/19 1515  TempSrc:   PainSc: 7                  Kalan Rinn

## 2019-01-14 NOTE — Transfer of Care (Signed)
Immediate Anesthesia Transfer of Care Note  Patient: [name]  Procedure(s) Performed: Right hip bursectomy; gluteal tendon repair (Right Hip)  Patient Location: PACU  Anesthesia Type:General  Level of Consciousness: awake  Airway & Oxygen Therapy: Patient Spontanous Breathing and Patient connected to face mask oxygen  Post-op Assessment: Report given to RN and Post -op Vital signs reviewed and stable  Post vital signs: Reviewed and stable  Last Vitals:  Vitals Value Taken Time  BP 155/98 01/14/19 1432  Temp    Pulse 91 01/14/19 1437  Resp 17 01/14/19 1437  SpO2 100 % 01/14/19 1437  Vitals shown include unvalidated device data.  Last Pain:  Vitals:   01/14/19 1102  TempSrc:   PainSc: 0-No pain      Patients Stated Pain Goal: 4 (90/24/09 7353)  Complications: No apparent anesthesia complications

## 2019-01-14 NOTE — Anesthesia Preprocedure Evaluation (Signed)
Anesthesia Evaluation  Patient identified by MRN, date of birth, ID band Patient awake    Reviewed: Allergy & Precautions, NPO status , Patient's Chart, lab work & pertinent test results  Airway Mallampati: II  TM Distance: >3 FB Neck ROM: Full    Dental no notable dental hx. (+) Teeth Intact   Pulmonary neg pulmonary ROS,    Pulmonary exam normal breath sounds clear to auscultation       Cardiovascular hypertension, Pt. on medications Normal cardiovascular exam Rhythm:Regular Rate:Normal     Neuro/Psych Anxiety negative neurological ROS     GI/Hepatic negative GI ROS, Neg liver ROS,   Endo/Other  Hyperlipidemia   Renal/GU negative Renal ROS  negative genitourinary   Musculoskeletal  (+) Arthritis , Osteoarthritis,  Lumbar stenosis   Abdominal   Peds  Hematology   Anesthesia Other Findings   Reproductive/Obstetrics                             Anesthesia Physical  Anesthesia Plan  ASA: III  Anesthesia Plan: General   Post-op Pain Management:    Induction: Intravenous  PONV Risk Score and Plan: 4 or greater and Ondansetron, Dexamethasone and Treatment may vary due to age or medical condition  Airway Management Planned: Oral ETT and LMA  Additional Equipment:   Intra-op Plan:   Post-operative Plan: Extubation in OR  Informed Consent: I have reviewed the patients History and Physical, chart, labs and discussed the procedure including the risks, benefits and alternatives for the proposed anesthesia with the patient or authorized representative who has indicated his/her understanding and acceptance.     Dental advisory given  Plan Discussed with: CRNA, Surgeon and Anesthesiologist  Anesthesia Plan Comments:         Anesthesia Quick Evaluation

## 2019-01-14 NOTE — Op Note (Signed)
[name], GIORGIO MEDICAL RECORD [id number] ACCOUNT [id number] DATE OF BIRTH:[birth year] FACILITY: [facility] LOCATION: [facility] PHYSICIAN:Haadiya Frogge [name], MD  OPERATIVE REPORT  DATE OF PROCEDURE:  01/14/2019  PREOPERATIVE DIAGNOSIS:  Right hip intractable bursitis with gluteal tendon tear.  POSTOPERATIVE DIAGNOSIS:  Right hip intractable bursitis with gluteal tendon tear.  PROCEDURE:  Right hip bursectomy and gluteal tendon repair.  SURGEON:  Gaynelle Arabian, MD  ASSISTANT:  Theresa Duty, PA-C  ANESTHESIA:  General.  ESTIMATED BLOOD LOSS:  Minimal.  DRAIN:  Hemovac x1.  COMPLICATIONS:  None.  CONDITION:  Stable to recovery.  BRIEF CLINICAL NOTE:  The patient is a 78 year old female with several-month history of significantly worsening right hip pain.  Exam and history are consistent with bursitis.  She has had physical therapy and cortisone injections which provide  short-term partial benefit.  MRI did show a gluteal tendon tear of the gluteus medius.  She presents now for bursectomy and gluteal tendon repair.  PROCEDURE IN DETAIL:  After successful administration of spinal anesthetic, the patient was placed in the left lateral decubitus position with the right side up and held with a hip positioner.  Right lower extremity was isolated from her perineum with  plastic drapes and prepped and draped in the usual sterile fashion.  A 4-inch incision was made vertical in nature, centered at the tip of the greater trochanter.  Skin was cut with a 10 blade through subcutaneous tissue to the fascia lata, which was  incised in line with the skin incision.  There was a thickened bursa present and this was removed.  She does have a tear at the anterior third of the gluteus medius.  It appeared to be a partial-thickness tear when I removed all of the bursa.  It was a  full-thickness tear with a small amount of retraction.  I roughened the surface of the greater trochanter with a rongeur and  then impacted a Mitek suture anchor into the greater trochanter.  The sutures were then passed through the free edge of the  gluteus medius tendon, which was advanced down to the greater trochanter.  It was then oversewn and sutures were tied.  This was a very stable repair.  The rest of the gluteus medius tendon was palpated.  No other tears were identified.  The wound was  then copiously irrigated with saline solution and meticulous hemostasis achieved.  The fascia lata was then closed with interrupted #1 Vicryl.  A triangle of tissue was removed over the tip of the greater trochanter to prevent friction over the repair.   Subcu was then closed over a Hemovac drain with interrupted 2-0 Vicryl and subcuticular closure running 4-0 Monocryl.  Incisions were cleaned and dried, and Steri-Strips and a bulky sterile dressing were applied.  She was then awakened and transported to  recovery in stable condition.  LN/NUANCE  D:01/14/2019 T:01/14/2019 JOB:[id number]

## 2019-01-15 ENCOUNTER — Encounter (HOSPITAL_COMMUNITY): Payer: Self-pay | Admitting: Orthopedic Surgery

## 2019-01-15 DIAGNOSIS — M7061 Trochanteric bursitis, right hip: Secondary | ICD-10-CM | POA: Diagnosis not present

## 2019-01-15 MED ORDER — ASPIRIN EC 325 MG PO TBEC: 325.0000 mg | DELAYED_RELEASE_TABLET | Freq: Every day | ORAL | 0 refills | Status: AC

## 2019-01-15 MED ORDER — OXYCODONE HCL 5 MG PO TABS: 5.0000 mg | ORAL_TABLET | Freq: Four times a day (QID) | ORAL | 0 refills | Status: DC | PRN

## 2019-01-15 NOTE — Progress Notes (Signed)
   Subjective: 1 Day Post-Op Procedure(s) (LRB): Right hip bursectomy; gluteal tendon repair (Right) Patient reports pain as mild.   Patient seen in rounds by Dr. Wynelle Link. Patient is well, and has had no acute complaints or problems. States she is ready to go home. Denies chest pain or SOB. No issues overnight. Voiding without difficulty.   Objective: Vital signs in last 24 hours: Temp:  [97.4 F (36.3 C)-98 F (36.7 C)] 97.8 F (36.6 C) (08/11 0527) Pulse Rate:  [65-98] 71 (08/11 0527) Resp:  [13-21] 16 (08/11 0527) BP: (122-181)/(60-98) 150/74 (08/11 0527) SpO2:  [97 %-100 %] 100 % (08/11 0527) Weight:  [85.8 kg] 85.8 kg (08/10 1102)  Intake/Output from previous day:  Intake/Output Summary (Last 24 hours) at 01/15/2019 0711 Last data filed at 01/15/2019 0200 Gross per 24 hour  Intake 2920 ml  Output 310 ml  Net 2610 ml    Labs: Recent Labs    01/14/19 1725  HGB 11.7*   Recent Labs    01/14/19 1725  WBC 7.2  RBC 4.01  HCT 37.1  PLT 232   Recent Labs    01/14/19 1725  CREATININE 0.58   Exam: General - Patient is Alert and Oriented Extremity - Neurologically intact Neurovascular intact Sensation intact distally Dorsiflexion/Plantar flexion intact Dressing - dressing C/D/I Motor Function - intact, moving foot and toes well on exam.   Past Medical History:  Diagnosis Date  . Anxiety   . Arthritis   . Bartholin's gland abscess 04/2017   Left, incised and drained  . High cholesterol   . History of iron deficiency anemia   . Hypertension     Assessment/Plan: 1 Day Post-Op Procedure(s) (LRB): Right hip bursectomy; gluteal tendon repair (Right) Active Problems:   Trochanteric bursitis, right hip  Estimated body mass index is 29.63 kg/m as calculated from the following:   Height as of this encounter: 5\' 7"  (1.702 m).   Weight as of this encounter: 85.8 kg. Advance diet Up with therapy D/C IV fluids  DVT Prophylaxis - Lovenox while in hospital,  will transition to aspirin at home Weight bearing as tolerated. D/C O2 and pulse ox and try on room air. Hemovac pulled without difficulty.  Plan is to go Home after hospital stay. Will work with physical therapy for one session after breakfast, then ready for discharge.  No active abduction or leading with the right leg up stairs x 6 weeks. Precautions discussed with patient.  Follow-up in the office in 2 weeks.   Theresa Duty, PA-C Orthopedic Surgery 01/15/2019, 7:11 AM

## 2019-01-15 NOTE — Progress Notes (Signed)
Physical Therapy Treatment Patient Details Name: Kristen MontgomeryMartha E Blake MRN: [id number] DOB: 08/29/1940 Today's Date: 01/15/2019    History of Present Illness s/p Right hip bursectomy; gluteal tendon repair. PMH: L5-S1 with fixation, syncope, HTN, bil TKA    PT Comments    Pt was limited this morning by N/V. Pt did however complete PT session prior to feeling sick, Pt was able to ambulate in the hall 11750ft with supervision with RW, completed step training and reinforced no active hip abduction especially when getting into bed. Pt verbalized and demonstrated good understanding. Pt is ready for d/c home when cleared medically.  Follow Up Recommendations  Follow surgeon's recommendation for DC plan and follow-up therapies     Equipment Recommendations  None recommended by PT    Recommendations for Other Services       Precautions / Restrictions Precautions Precautions: Other (comment) Precaution Comments: no active abduction RLE Restrictions Weight Bearing Restrictions: No Other Position/Activity Restrictions: WBAT    Mobility  Bed Mobility Overal bed mobility: Modified Independent Bed Mobility: Supine to Sit;Sit to Supine     Supine to sit: Modified independent (Device/Increase time);HOB elevated Sit to supine: Modified independent (Device/Increase time);HOB elevated      Transfers Overall transfer level: Modified independent Equipment used: Rolling walker (2 wheeled) Transfers: Sit to/from Stand Sit to Stand: Modified independent (Device/Increase time)            Ambulation/Gait Ambulation/Gait assistance: Supervision Gait Distance (Feet): 160 Feet Assistive device: Rolling walker (2 wheeled) Gait Pattern/deviations: Step-through pattern;Decreased stride length Gait velocity: decreased       Stairs Stairs: Yes Stairs assistance: Min guard Stair Management: Forwards;Two rails Number of Stairs: 2     Wheelchair Mobility    Modified Rankin (Stroke Patients  Only)       Balance Overall balance assessment: No apparent balance deficits (not formally assessed)                                          Cognition Arousal/Alertness: Awake/alert Behavior During Therapy: WFL for tasks assessed/performed Overall Cognitive Status: Within Functional Limits for tasks assessed                                        Exercises      General Comments General comments (skin integrity, edema, etc.): Pt c/o nausea immediately after steps. Pt sat daown and vomited. RN called. Pt also ambulated to the BR and was min guard assist with toilet transfer.      Pertinent Vitals/Pain Pain Score: 5  Pain Location: right hip Pain Descriptors / Indicators: Discomfort Pain Intervention(s): Limited activity within patient's tolerance;Monitored during session    Home Living                      Prior Function            PT Goals (current goals can now be found in the care plan section) Progress towards PT goals: Progressing toward goals    Frequency    7X/week      PT Plan Current plan remains appropriate    Co-evaluation              AM-PAC PT "6 Clicks" Mobility   Outcome Measure  Help needed turning from your back  to your side while in a flat bed without using bedrails?: None Help needed moving from lying on your back to sitting on the side of a flat bed without using bedrails?: None Help needed moving to and from a bed to a chair (including a wheelchair)?: A Little Help needed standing up from a chair using your arms (e.g., wheelchair or bedside chair)?: A Little Help needed to walk in hospital room?: A Little Help needed climbing 3-5 steps with a railing? : A Little 6 Click Score: 20    End of Session Equipment Utilized During Treatment: Gait belt Activity Tolerance: Other (comment)(N/V) Patient left: in bed;with nursing/sitter in room Nurse Communication: Mobility status PT Visit  Diagnosis: Difficulty in walking, not elsewhere classified (R26.2)     Time: 0935-1006 PT Time Calculation (min) (ACUTE ONLY): 31 min  Charges:  $Gait Training: 8-22 mins $Therapeutic Activity: 8-22 mins                     Theodoro Grist, PT   [name] 01/15/2019, 10:12 AM

## 2019-01-15 NOTE — Plan of Care (Signed)
Plan of care reviewed and discussed with the patient. 

## 2019-01-15 NOTE — Plan of Care (Signed)
Pt ready for DC home 

## 2019-01-16 NOTE — Discharge Summary (Signed)
Physician Discharge Summary   Patient ID: [name] MRN: [id number] DOB/AGE: 77/10/1940 77 y.o.  Admit date: 01/14/2019 Discharge date: 01/15/2019  Primary Diagnosis: Right hip intractable bursitis with gluteal tendon tear   Admission Diagnoses:  Past Medical History:  Diagnosis Date   Anxiety    Arthritis    Bartholin's gland abscess 04/2017   Left, incised and drained   High cholesterol    History of iron deficiency anemia    Hypertension    Discharge Diagnoses:   Active Problems:   Trochanteric bursitis, right hip  Estimated body mass index is 29.63 kg/m as calculated from the following:   Height as of this encounter: 5\' 7"  (1.702 m).   Weight as of this encounter: 85.8 kg.  Procedure:  Procedure(s) (LRB): Right hip bursectomy; gluteal tendon repair (Right)   Consults: None  HPI: The patient is a 78 year old female with several-month history of significantly worsening right hip pain.  Exam and history are consistent with bursitis.  She has had physical therapy and cortisone injections which provide short-term partial benefit.  MRI did show a gluteal tendon tear of the gluteus medius.  She presents now for bursectomy and gluteal tendon repair.  Laboratory Data: Admission on 01/14/2019, Discharged on 01/15/2019  Component Date Value Ref Range Status   WBC 01/14/2019 7.2  4.0 - 10.5 K/uL Final   RBC 01/14/2019 4.01  3.87 - 5.11 MIL/uL Final   Hemoglobin 01/14/2019 11.7* 12.0 - 15.0 g/dL Final   HCT 86/57/846908/03/2019 37.1  36.0 - 46.0 % Final   MCV 01/14/2019 92.5  80.0 - 100.0 fL Final   MCH 01/14/2019 29.2  26.0 - 34.0 pg Final   MCHC 01/14/2019 31.5  30.0 - 36.0 g/dL Final   RDW 62/95/284108/03/2019 14.7  11.5 - 15.5 % Final   Platelets 01/14/2019 232  150 - 400 K/uL Final   nRBC 01/14/2019 0.0  0.0 - 0.2 % Final   Performed at [facility], [address] [place], [place] [address]   Creatinine, Ser 01/14/2019 0.58  0.44 - 1.00 mg/dL Final     GFR calc non Af Amer 01/14/2019 >60  >60 mL/min Final   GFR calc Af Amer 01/14/2019 >60  >60 mL/min Final   Performed at [facility], [address] [place], [place] [address]  Hospital Outpatient Visit on 01/10/2019  Component Date Value Ref Range Status   SARS Coronavirus 2 01/10/2019 NEGATIVE  NEGATIVE Final   Comment: (NOTE) SARS-CoV-2 target nucleic acids are NOT DETECTED. The SARS-CoV-2 RNA is generally detectable in upper and lower respiratory specimens during the acute phase of infection. Negative results do not preclude SARS-CoV-2 infection, do not rule out co-infections with other pathogens, and should not be used as the sole basis for treatment or other patient management decisions. Negative results must be combined with clinical observations, patient history, and epidemiological information. The expected result is Negative. Fact Sheet for Patients: HairSlick.nohttps://www.fda.gov/media/138098/download Fact Sheet for Healthcare Providers: quierodirigir.comhttps://www.fda.gov/media/138095/download This test is not yet approved or cleared by the [place] FDA and  has been authorized for detection and/or diagnosis of SARS-CoV-2 by FDA under an Emergency Use Authorization (EUA). This EUA will remain  in effect (meaning this test can be used) for the duration of the COVID-19 declaration under Section 56                          4(b)(1) of the Act, 21 U.S.C. section 360bbb-3(b)(1),  unless the authorization is terminated or revoked sooner. Performed at [facility], [address] [place], [place] [address]   Hospital Outpatient Visit on 01/09/2019  Component Date Value Ref Range Status   Sodium 01/09/2019 141  135 - 145 mmol/L Final   Potassium 01/09/2019 3.8  3.5 - 5.1 mmol/L Final   Chloride 01/09/2019 109  98 - 111 mmol/L Final   CO2 01/09/2019 22  22 - 32 mmol/L Final   Glucose, Bld 01/09/2019 102* 70 - 99 mg/dL Final   BUN 60/45/409808/10/2018 21  8 - 23 mg/dL  Final   Creatinine, Ser 01/09/2019 0.74  0.44 - 1.00 mg/dL Final   Calcium 11/91/478208/10/2018 8.9  8.9 - 10.3 mg/dL Final   GFR calc non Af Amer 01/09/2019 >60  >60 mL/min Final   GFR calc Af Amer 01/09/2019 >60  >60 mL/min Final   Anion gap 01/09/2019 10  5 - 15 Final   Performed at [facility], [address] [place], [place] [address]   WBC 01/09/2019 6.0  4.0 - 10.5 K/uL Final   RBC 01/09/2019 4.41  3.87 - 5.11 MIL/uL Final   Hemoglobin 01/09/2019 12.8  12.0 - 15.0 g/dL Final   HCT 30/86/578408/10/2018 40.5  36.0 - 46.0 % Final   MCV 01/09/2019 91.8  80.0 - 100.0 fL Final   MCH 01/09/2019 29.0  26.0 - 34.0 pg Final   MCHC 01/09/2019 31.6  30.0 - 36.0 g/dL Final   RDW 69/62/952808/10/2018 14.7  11.5 - 15.5 % Final   Platelets 01/09/2019 271  150 - 400 K/uL Final   nRBC 01/09/2019 0.0  0.0 - 0.2 % Final   Performed at [facility], [address] [place], [place] [address]     X-Rays:No results found.  EKG: Orders placed or performed during the hospital encounter of 06/22/18   EKG 12-Lead   EKG 12-Lead   EKG 12-Lead   EKG 12-Lead     Hospital Course: [name] is a 78 y.o. who was admitted to [facility]. They were brought to the operating room on 01/14/2019 and underwent Procedure(s): Right hip bursectomy; gluteal tendon repair.  Patient tolerated the procedure well and was later transferred to the recovery room and then to the orthopaedic floor for postoperative care. They were given PO and IV analgesics for pain control following their surgery. They were given 24 hours of postoperative antibiotics of  Anti-infectives (From admission, onward)   Start     Dose/Rate Route Frequency Ordered Stop   01/14/19 2000  ceFAZolin (ANCEF) IVPB 2g/100 mL premix     2 g 200 mL/hr over 30 Minutes Intravenous  Once 01/14/19 1655 01/14/19 2016   01/14/19 1045  ceFAZolin (ANCEF) IVPB 2g/100 mL premix     2 g 200 mL/hr over 30 Minutes Intravenous  On call to O.R. 01/14/19 1039 01/14/19 1314     and started on DVT prophylaxis in the form of Lovenox.   PT and OT were ordered for total joint protocol. Discharge planning consulted to help with postop disposition and equipment needs.  Patient had a good night on the evening of surgery. Pt was seen during rounds and was ready to go home.. Hemovac drain was pulled without difficulty. She worked with physical therapy for one session to go over precautions. Pt was discharged to home later that day in stable condition.  Diet: Regular diet Activity: WBAT No active abduction or leading with the right leg up  stairs x 6 weeks. Precautions discussed with patient.  Follow-up: in 2 weeks Disposition: Home Discharged Condition: stable   Discharge Instructions    Call MD / Call 911   Complete by: As directed    If you experience chest pain or shortness of breath, CALL 911 and be transported to the hospital emergency room.  If you develope a fever above 101 F, pus (white drainage) or increased drainage or redness at the wound, or calf pain, call your surgeon's office.   Constipation Prevention   Complete by: As directed    Drink plenty of fluids.  Prune juice may be helpful.  You may use a stool softener, such as Colace (over the counter) 100 mg twice a day.  Use MiraLax (over the counter) for constipation as needed.   Diet - low sodium heart healthy   Complete by: As directed    Driving restrictions   Complete by: As directed    No driving for 2 weeks   Weight bearing as tolerated   Complete by: As directed      Allergies as of 01/15/2019   No Known Allergies     Medication List    STOP taking these medications   celecoxib 200 MG capsule Commonly known as: CELEBREX   meloxicam 15 MG tablet Commonly known as: MOBIC   oxyCODONE-acetaminophen 5-325 MG tablet Commonly known as: PERCOCET/ROXICET     TAKE these medications   aspirin EC 325 MG tablet Take 1 tablet (325 mg total) by mouth  daily for 21 days. Then resume one 81 mg aspirin once a day. What changed:   medication strength  how much to take  when to take this  additional instructions   clobetasol ointment 0.05 % Commonly known as: TEMOVATE Apply topically 2 (two) times daily. As needed for itching   donepezil 5 MG tablet Commonly known as: ARICEPT Take 5 mg by mouth daily.   enalapril 20 MG tablet Commonly known as: VASOTEC Take 20 mg by mouth 2 (two) times daily.   estradiol 0.5 MG tablet Commonly known as: ESTRACE Take 1 tablet (0.5 mg total) by mouth daily.   gabapentin 100 MG capsule Commonly known as: NEURONTIN Take 2 capsules (200 mg total) by mouth 3 (three) times daily.   methocarbamol 500 MG tablet Commonly known as: ROBAXIN Take 1 tablet (500 mg total) by mouth every 6 (six) hours as needed for muscle spasms.   oxyCODONE 5 MG immediate release tablet Commonly known as: Oxy IR/ROXICODONE Take 1-2 tablets (5-10 mg total) by mouth every 6 (six) hours as needed for moderate pain (pain score 4-6).   pravastatin 20 MG tablet Commonly known as: PRAVACHOL Take 20 mg by mouth daily.   progesterone 100 MG capsule Commonly known as: PROMETRIUM Take 1 capsule (100 mg total) by mouth daily. What changed: when to take this   sertraline 50 MG tablet Commonly known as: ZOLOFT Take 50 mg by mouth daily.   Vitamin D3 125 MCG (5000 UT) Caps Take 5,000 Units by mouth daily.            Discharge Care Instructions  (From admission, onward)         Start     Ordered   01/15/19 0000  Weight bearing as tolerated     01/15/19 3614         Follow-up Information    Gaynelle Arabian, MD. Schedule an appointment as soon as possible for a visit on 01/29/2019.   Specialty: Orthopedic  Surgery Contact information: [address] [place] [address] [phone]           Signed: Arther AbbottKristie Nguyen Butler, PA-C Orthopedic Surgery 01/16/2019, 10:16 AM

## 2019-03-07 ENCOUNTER — Other Ambulatory Visit: Payer: Self-pay | Admitting: Internal Medicine

## 2019-03-07 DIAGNOSIS — Z1231 Encounter for screening mammogram for malignant neoplasm of breast: Secondary | ICD-10-CM

## 2019-03-14 ENCOUNTER — Encounter: Payer: Self-pay | Admitting: Gynecology

## 2019-04-23 ENCOUNTER — Other Ambulatory Visit: Payer: Self-pay

## 2019-04-23 ENCOUNTER — Ambulatory Visit
Admission: RE | Admit: 2019-04-23 | Discharge: 2019-04-23 | Disposition: A | Discharge status: Home / Self Care | Payer: Medicare Other | Source: Ambulatory Visit | Attending: Internal Medicine | Admitting: Internal Medicine

## 2019-04-23 DIAGNOSIS — Z1231 Encounter for screening mammogram for malignant neoplasm of breast: Secondary | ICD-10-CM

## 2019-04-25 ENCOUNTER — Other Ambulatory Visit: Payer: Self-pay | Admitting: Internal Medicine

## 2019-04-25 DIAGNOSIS — R928 Other abnormal and inconclusive findings on diagnostic imaging of breast: Secondary | ICD-10-CM

## 2019-05-08 ENCOUNTER — Other Ambulatory Visit: Payer: Self-pay

## 2019-05-09 ENCOUNTER — Encounter: Payer: Self-pay | Admitting: Gynecology

## 2019-05-09 ENCOUNTER — Ambulatory Visit (INDEPENDENT_AMBULATORY_CARE_PROVIDER_SITE_OTHER): Payer: Medicare Other | Admitting: Gynecology

## 2019-05-09 VITALS — BP 130/82 | Ht 65.0 in | Wt 178.0 lb

## 2019-05-09 DIAGNOSIS — Z01419 Encounter for gynecological examination (general) (routine) without abnormal findings: Secondary | ICD-10-CM

## 2019-05-09 DIAGNOSIS — N952 Postmenopausal atrophic vaginitis: Secondary | ICD-10-CM | POA: Diagnosis not present

## 2019-05-09 DIAGNOSIS — L900 Lichen sclerosus et atrophicus: Secondary | ICD-10-CM | POA: Diagnosis not present

## 2019-05-09 DIAGNOSIS — Z79890 Hormone replacement therapy (postmenopausal): Secondary | ICD-10-CM | POA: Diagnosis not present

## 2019-05-09 MED ORDER — CLOBETASOL PROPIONATE 0.05 % EX OINT: TOPICAL_OINTMENT | Freq: Two times a day (BID) | CUTANEOUS | 3 refills | Status: DC

## 2019-05-09 MED ORDER — PROGESTERONE MICRONIZED 100 MG PO CAPS: 100.0000 mg | ORAL_CAPSULE | Freq: Every day | ORAL | 4 refills | Status: DC

## 2019-05-09 MED ORDER — ESTRADIOL 0.5 MG PO TABS: 0.5000 mg | ORAL_TABLET | Freq: Every day | ORAL | 4 refills | Status: DC

## 2019-05-09 NOTE — Progress Notes (Signed)
    [name] [facility] [birth year] [id number]        78 y.o.  G0P0 for annual gynecologic exam.  Without gynecologic complaints  Past medical history,surgical history, problem list, medications, allergies, family history and social history were all reviewed and documented as reviewed in the EPIC chart.  ROS:  Performed with pertinent positives and negatives included in the history, assessment and plan.   Additional significant findings : None   Exam: Caryn Bee assistant Vitals:   05/09/19 1124  BP: 130/82  Weight: 178 lb (80.7 kg)  Height: 5\' 5"  (1.651 m)   Body mass index is 29.62 kg/m.  General appearance:  Normal affect, orientation and appearance. Skin: Grossly normal HEENT: Without gross lesions.  No cervical or supraclavicular adenopathy. Thyroid normal.  Lungs:  Clear without wheezing, rales or rhonchi Cardiac: RR, without RMG Abdominal:  Soft, nontender, without masses, guarding, rebound, organomegaly or hernia Breasts:  Examined lying and sitting without masses, retractions, discharge or axillary adenopathy. Pelvic:  Ext, BUS, Vagina: With atrophic changes.  Mild symmetrical perivaginal blanching consistent with diagnosis of lichen sclerosis  Cervix: With atrophic changes  Uterus: Difficult to palpate but no gross masses or tenderness  Adnexa: Without masses or tenderness    Anus and perineum: Normal   Rectovaginal: Normal sphincter tone without palpated masses or tenderness.    Assessment/Plan:  78 y.o. G0P0 female for annual gynecologic exam.   1. Postmenopausal/HRT.  Continues on estradiol 0.5 mg and Prometrium 100 mg nightly.  No bleeding.  We discussed the risks to include a realistic discussion of thrombosis  stroke heart attack DVT.  Risks of breast cancer also discussed.  Recommended patient try to wean now and see how she does.  If she does not tolerate this and would like to continue refill x1 year provided. 2. History of lichen sclerosis.  Uses  Temovate intermittently.  Refill 0.05% 30 g with 3 refills provided to use as needed. 3. DEXA reported 2019 through her primary physician's office.  We will continue to follow-up with Dr. Joya Salm for bone health management. 4. Pap smear 2012.  No Pap smear done today.  No history of significant abnormal Pap smears.  We both agree to stop screening per current screening guidelines. 5. Colonoscopy reported 2013.  Repeat at their recommended interval. 6. Mammography 04/2019.  Has area of asymmetry in left breast with follow-up diagnostic mammogram possible ultrasound scheduled this coming Monday.  We will follow-up for this.  Breast exam normal today. 7. Health maintenance.  No routine lab work done as patient does this at Dr. Jacquiline Doe office.  Follow-up 1 year, sooner as needed.   Anastasio Auerbach MD, 12:12 PM 05/09/2019

## 2019-05-09 NOTE — Patient Instructions (Addendum)
Trying to wean off of the hormone replacement as we discussed.  Call if you have any issues with this.  Follow-up for the diagnostic mammography as scheduled.  Follow-up in 1 year for annual exam

## 2019-05-13 ENCOUNTER — Ambulatory Visit
Admission: RE | Admit: 2019-05-13 | Discharge: 2019-05-13 | Disposition: A | Discharge status: Home / Self Care | Payer: Medicare Other | Source: Ambulatory Visit | Attending: Internal Medicine | Admitting: Internal Medicine

## 2019-05-13 ENCOUNTER — Other Ambulatory Visit: Payer: Self-pay | Admitting: Internal Medicine

## 2019-05-13 ENCOUNTER — Other Ambulatory Visit: Payer: Self-pay

## 2019-05-13 DIAGNOSIS — R928 Other abnormal and inconclusive findings on diagnostic imaging of breast: Secondary | ICD-10-CM

## 2019-05-13 DIAGNOSIS — N6489 Other specified disorders of breast: Secondary | ICD-10-CM

## 2019-05-14 ENCOUNTER — Ambulatory Visit
Admission: RE | Admit: 2019-05-14 | Discharge: 2019-05-14 | Disposition: A | Discharge status: Home / Self Care | Payer: Medicare Other | Source: Ambulatory Visit | Attending: Internal Medicine | Admitting: Internal Medicine

## 2019-05-14 ENCOUNTER — Other Ambulatory Visit: Payer: Self-pay

## 2019-05-14 DIAGNOSIS — N6489 Other specified disorders of breast: Secondary | ICD-10-CM

## 2019-06-05 DIAGNOSIS — M75110 Incomplete rotator cuff tear or rupture of unspecified shoulder, not specified as traumatic: Secondary | ICD-10-CM | POA: Insufficient documentation

## 2019-06-21 DIAGNOSIS — M533 Sacrococcygeal disorders, not elsewhere classified: Secondary | ICD-10-CM | POA: Insufficient documentation

## 2019-06-26 ENCOUNTER — Other Ambulatory Visit: Payer: Self-pay | Admitting: Neurological Surgery

## 2019-06-26 DIAGNOSIS — M51369 Other intervertebral disc degeneration, lumbar region without mention of lumbar back pain or lower extremity pain: Secondary | ICD-10-CM

## 2019-06-26 DIAGNOSIS — M5136 Other intervertebral disc degeneration, lumbar region: Secondary | ICD-10-CM

## 2019-07-15 ENCOUNTER — Other Ambulatory Visit: Payer: Self-pay

## 2019-07-15 ENCOUNTER — Ambulatory Visit
Admission: RE | Admit: 2019-07-15 | Discharge: 2019-07-15 | Disposition: A | Discharge status: Home / Self Care | Payer: Medicare Other | Source: Ambulatory Visit | Attending: Neurological Surgery | Admitting: Neurological Surgery

## 2019-07-15 DIAGNOSIS — M5136 Other intervertebral disc degeneration, lumbar region: Secondary | ICD-10-CM

## 2019-07-15 MED ORDER — METHYLPREDNISOLONE ACETATE 40 MG/ML INJ SUSP (RADIOLOG
120.0000 mg | Freq: Once | INTRAMUSCULAR | Status: AC
  Administered 2019-07-15: 120 mg via INTRALESIONAL

## 2019-07-15 MED ORDER — IOPAMIDOL (ISOVUE-M 200) INJECTION 41%
1.0000 mL | Freq: Once | INTRAMUSCULAR | Status: AC
  Administered 2019-07-15: 1 mL

## 2019-07-15 MED ORDER — CEFAZOLIN SODIUM-DEXTROSE 2-4 GM/100ML-% IV SOLN
2.0000 g | INTRAVENOUS | Status: AC
  Administered 2019-07-15: 2 g via INTRAVENOUS

## 2019-07-15 NOTE — Discharge Instructions (Signed)
    Intradiscal Injection with Steroids Discharge Instruction Sheet  1. You may resume a regular diet and any medications that you routinely take, including pain medications.  2. No driving the rest of the day of the procedure.  3. Light activity throughout the rest of the day.  Do not do any strenuous work, exercise, bending or lifting.  The day following the procedure, you may resume normal physical activity but you should refrain from exercising or physical therapy for at least three days.   Common Side Effects:   Headaches- take your usual medications as directed by your physician.     Restlessness or inability to sleep- you may have trouble sleeping for the next few days.  Ask your referring physician if you need any medication for sleep if over the counter sleep medications do not help.   Facial flushing or redness- this should subside within a few days.   Increased pain- a temporary increase in pain a day or two following your procedure is not unusual.  Take your pain medication as prescribed by your referring physician.  You may use ice to the injection site as needed.  Please do not use heat for 24 hours.   Leg cramps  Please contact our office at [phone] for the following symptoms:  Fever greater than 100 degrees.  Headaches unresolved with medication after 2-3 days.  Increased swelling, pain, or redness at injection site.  Thank you for visiting our office.

## 2019-08-22 ENCOUNTER — Other Ambulatory Visit: Payer: Self-pay | Admitting: Neurological Surgery

## 2019-08-27 ENCOUNTER — Other Ambulatory Visit: Payer: Self-pay

## 2019-08-27 ENCOUNTER — Encounter (HOSPITAL_COMMUNITY)
Admission: RE | Admit: 2019-08-27 | Discharge: 2019-08-27 | Disposition: A | Discharge status: Home / Self Care | Payer: Medicare Other | Source: Ambulatory Visit | Attending: Neurological Surgery | Admitting: Neurological Surgery

## 2019-08-27 ENCOUNTER — Encounter (HOSPITAL_COMMUNITY): Payer: Self-pay

## 2019-08-27 DIAGNOSIS — I10 Essential (primary) hypertension: Secondary | ICD-10-CM | POA: Insufficient documentation

## 2019-08-27 DIAGNOSIS — R9431 Abnormal electrocardiogram [ECG] [EKG]: Secondary | ICD-10-CM | POA: Insufficient documentation

## 2019-08-27 DIAGNOSIS — Z01818 Encounter for other preprocedural examination: Secondary | ICD-10-CM | POA: Diagnosis present

## 2019-08-27 LAB — CBC
HCT: 41.5 % (ref 36.0–46.0)
Hemoglobin: 13.4 g/dL (ref 12.0–15.0)
MCH: 30.1 pg (ref 26.0–34.0)
MCHC: 32.3 g/dL (ref 30.0–36.0)
MCV: 93.3 fL (ref 80.0–100.0)
Platelets: 265 10*3/uL (ref 150–400)
RBC: 4.45 MIL/uL (ref 3.87–5.11)
RDW: 14.6 % (ref 11.5–15.5)
WBC: 6.1 10*3/uL (ref 4.0–10.5)
nRBC: 0 % (ref 0.0–0.2)

## 2019-08-27 LAB — BASIC METABOLIC PANEL
Anion gap: 11 (ref 5–15)
BUN: 15 mg/dL (ref 8–23)
CO2: 25 mmol/L (ref 22–32)
Calcium: 8.9 mg/dL (ref 8.9–10.3)
Chloride: 107 mmol/L (ref 98–111)
Creatinine, Ser: 0.81 mg/dL (ref 0.44–1.00)
GFR calc Af Amer: >60 mL/min (ref >60)
GFR calc non Af Amer: >60 mL/min (ref >60)
Glucose, Bld: 95 mg/dL (ref 70–99)
Potassium: 4 mmol/L (ref 3.5–5.1)
Sodium: 143 mmol/L (ref 135–145)

## 2019-08-27 LAB — TYPE AND SCREEN
ABO/RH(D): O NEG
Antibody Screen: NEGATIVE

## 2019-08-27 LAB — SURGICAL PCR SCREEN
MRSA, PCR: NEGATIVE
Staphylococcus aureus: NEGATIVE

## 2019-08-27 NOTE — Progress Notes (Signed)
PCP - Geoffry Paradise, MD Cardiologist - Denies  PPM/ICD - Denies  Chest x-ray - N/A EKG - 08/27/19 Stress Test - Denies ECHO - Per patient, > 10 years ago; it was suggested by her GYN because she thought she heard a murmur, however, results were normal.  Cardiac Cath - Denies  Sleep Study - Denies  Patient denies being a diabetic.  Blood Thinner Instructions: N/A Aspirin Instructions: Per patient, stop ASA 1 week prior to sx. Last dose 08/27/19  ERAS Protcol - N/A PRE-SURGERY Ensure or G2- N/A  COVID TEST- 08/31/19   Anesthesia review: Yes, Abnormal EKG.  Patient denies shortness of breath, fever, cough and chest pain at PAT appointment   All instructions explained to the patient, with a verbal understanding of the material. Patient agrees to go over the instructions while at home for a better understanding. Patient also instructed to self quarantine after being tested for COVID-19. The opportunity to ask questions was provided.

## 2019-08-27 NOTE — Pre-Procedure Instructions (Signed)
[id number] - [place], [place] - [address] AT [address] OF [facility] [address] [place] [address] Phone: [phone] Fax: [phone]      Your procedure is scheduled on Tuesday, March 30th, from 1:55 PM to 4:04 PM.  [facility] at 11:55 A.M., and check in at the Admitting office.  Call this number if you have problems the morning of surgery:  [phone]  Call [phone] if you have any questions prior to your surgery date Monday-Friday 8am-4pm.    Remember:  Do not eat or drink after midnight the night before your surgery.    Take these medicines the morning of surgery with A SIP OF WATER : donepezil (ARICEPT) estradiol (ESTRACE) pravastatin (PRAVACHOL) sertraline (ZOLOFT)   Follow your surgeon's instructions on when to stop Aspirin.  If no instructions were given by your surgeon then you will need to call the office to get those instructions.     As of today, stop taking all Aspirin (unless instructed by your doctor) and other Aspirin containing products, Vitamins, Fish Oils, and Herbal Medications. Also stop all NSAIDS i.e. meloxicam (MOBIC), Advil, Ibuprofen, Motrin, Aleve, Anaprox, Naproxen, BC, Goody Powders, and all Supplements.   No Smoking of any kind, Tobacco, or Alcohol products 24 hours prior to your procedure. If you use a CPAP at night, you may bring all equipment for your overnight stay.                        Do not wear jewelry, make up, or nail polish.            Do not wear lotions, powders, perfumes, or deodorant.            Do not shave 48 hours prior to surgery.              Do not bring valuables to the hospital.            [facility] is not responsible for any belongings or valuables.   Contacts, glasses, dentures or bridgework may not be worn into surgery.      For patients admitted to the hospital, discharge time will be determined by your treatment team.   Patients discharged  the day of surgery will not be allowed to drive home, and someone needs to stay with them for 24 hours.    Special instructions:   [place]- Preparing For Surgery  Before surgery, you can play an important role. Because skin is not sterile, your skin needs to be as free of germs as possible. You can reduce the number of germs on your skin by washing with CHG (chlorahexidine gluconate) Soap before surgery.  CHG is an antiseptic cleaner which kills germs and bonds with the skin to continue killing germs even after washing.    Oral Hygiene is also important to reduce your risk of infection.  Remember - BRUSH YOUR TEETH THE MORNING OF SURGERY WITH YOUR REGULAR TOOTHPASTE  Please do not use if you have an allergy to CHG or antibacterial soaps. If your skin becomes reddened/irritated stop using the CHG.  Do not shave (including legs and underarms) for at least 48 hours prior to first CHG shower. It is OK to shave your face.  Please follow these instructions carefully.   1. Shower the NIGHT BEFORE SURGERY and the MORNING OF SURGERY with CHG Soap.   2. If you chose to wash your hair, wash  your hair first as usual with your normal shampoo.  3. After you shampoo, rinse your hair and body thoroughly to remove the shampoo.  4. Use CHG as you would any other liquid soap. You can apply CHG directly to the skin and wash gently with a scrungie or a clean washcloth.   5. Apply the CHG Soap to your body ONLY FROM THE NECK DOWN.  Do not use on open wounds or open sores. Avoid contact with your eyes, ears, mouth and genitals (private parts). Wash Face and genitals (private parts)  with your normal soap.   6. Wash thoroughly, paying special attention to the area where your surgery will be performed.  7. Thoroughly rinse your body with warm water from the neck down.  8. DO NOT shower/wash with your normal soap after using and rinsing off the CHG Soap.  9. Pat yourself dry with a CLEAN TOWEL.  10. Wear  CLEAN PAJAMAS to bed the night before surgery, wear comfortable clothes the morning of surgery  11. Place CLEAN SHEETS on your bed the night of your first shower and DO NOT SLEEP WITH PETS.   Day of Surgery:   Do not apply any deodorants/lotions.  Please wear clean clothes to the hospital/surgery center.   Remember to brush your teeth WITH YOUR REGULAR TOOTHPASTE.   Please read over the following fact sheets that you were given.

## 2019-08-31 ENCOUNTER — Other Ambulatory Visit (HOSPITAL_COMMUNITY)
Admission: RE | Admit: 2019-08-31 | Discharge: 2019-08-31 | Disposition: A | Discharge status: Home / Self Care | Payer: Medicare Other | Source: Ambulatory Visit | Attending: Neurological Surgery | Admitting: Neurological Surgery

## 2019-08-31 LAB — SARS CORONAVIRUS 2 (TAT 6-24 HRS): SARS Coronavirus 2: NEGATIVE

## 2019-09-03 ENCOUNTER — Inpatient Hospital Stay (HOSPITAL_COMMUNITY): Payer: Medicare Other

## 2019-09-03 ENCOUNTER — Inpatient Hospital Stay (HOSPITAL_COMMUNITY): Payer: Medicare Other | Admitting: Anesthesiology

## 2019-09-03 ENCOUNTER — Inpatient Hospital Stay (HOSPITAL_COMMUNITY)
Admission: RE | Admit: 2019-09-03 | Discharge: 2019-09-04 | DRG: 458 | Disposition: A | Discharge status: Home Health | Payer: Medicare Other | Attending: Neurological Surgery | Admitting: Neurological Surgery

## 2019-09-03 ENCOUNTER — Inpatient Hospital Stay (HOSPITAL_COMMUNITY): Payer: Medicare Other | Admitting: Physician Assistant

## 2019-09-03 ENCOUNTER — Encounter (HOSPITAL_COMMUNITY): Payer: Self-pay | Admitting: Neurological Surgery

## 2019-09-03 ENCOUNTER — Other Ambulatory Visit: Payer: Self-pay

## 2019-09-03 ENCOUNTER — Encounter (HOSPITAL_COMMUNITY)
Admission: RE | Disposition: A | Discharge status: Home / Self Care | Payer: Self-pay | Source: Home / Self Care | Attending: Neurological Surgery

## 2019-09-03 DIAGNOSIS — F419 Anxiety disorder, unspecified: Secondary | ICD-10-CM | POA: Diagnosis present

## 2019-09-03 DIAGNOSIS — I10 Essential (primary) hypertension: Secondary | ICD-10-CM | POA: Diagnosis present

## 2019-09-03 DIAGNOSIS — M4726 Other spondylosis with radiculopathy, lumbar region: Secondary | ICD-10-CM | POA: Diagnosis present

## 2019-09-03 DIAGNOSIS — Z981 Arthrodesis status: Secondary | ICD-10-CM

## 2019-09-03 DIAGNOSIS — Z9049 Acquired absence of other specified parts of digestive tract: Secondary | ICD-10-CM | POA: Diagnosis not present

## 2019-09-03 DIAGNOSIS — Z96652 Presence of left artificial knee joint: Secondary | ICD-10-CM | POA: Diagnosis present

## 2019-09-03 DIAGNOSIS — M4186 Other forms of scoliosis, lumbar region: Secondary | ICD-10-CM | POA: Diagnosis present

## 2019-09-03 DIAGNOSIS — Z20822 Contact with and (suspected) exposure to covid-19: Secondary | ICD-10-CM | POA: Diagnosis present

## 2019-09-03 DIAGNOSIS — Z419 Encounter for procedure for purposes other than remedying health state, unspecified: Secondary | ICD-10-CM

## 2019-09-03 DIAGNOSIS — M48062 Spinal stenosis, lumbar region with neurogenic claudication: Secondary | ICD-10-CM | POA: Diagnosis present

## 2019-09-03 HISTORY — PX: ANTERIOR LAT LUMBAR FUSION: SHX1168

## 2019-09-03 LAB — GLUCOSE, CAPILLARY: Glucose-Capillary: 121 mg/dL — ABNORMAL HIGH (ref 70–99)

## 2019-09-03 SURGERY — ANTERIOR LATERAL LUMBAR FUSION 1 LEVEL
Anesthesia: General | Site: Spine Lumbar

## 2019-09-03 MED ORDER — PROMETHAZINE HCL 25 MG/ML IJ SOLN
INTRAMUSCULAR | Status: AC
  Filled 2019-09-03: qty 1

## 2019-09-03 MED ORDER — BUPIVACAINE HCL (PF) 0.5 % IJ SOLN
INTRAMUSCULAR | Status: AC
  Filled 2019-09-03: qty 30

## 2019-09-03 MED ORDER — ACETAMINOPHEN 650 MG RE SUPP: 650.0000 mg | RECTAL | Status: DC | PRN

## 2019-09-03 MED ORDER — CHLORHEXIDINE GLUCONATE CLOTH 2 % EX PADS: 6.0000 | MEDICATED_PAD | Freq: Once | CUTANEOUS | Status: DC

## 2019-09-03 MED ORDER — DEXAMETHASONE SODIUM PHOSPHATE 10 MG/ML IJ SOLN
INTRAMUSCULAR | Status: AC
  Filled 2019-09-03: qty 1

## 2019-09-03 MED ORDER — LACTATED RINGERS IV SOLN: INTRAVENOUS | Status: DC | PRN

## 2019-09-03 MED ORDER — LIDOCAINE-EPINEPHRINE 1 %-1:100000 IJ SOLN
INTRAMUSCULAR | Status: DC | PRN
  Administered 2019-09-03: 5 mL

## 2019-09-03 MED ORDER — PROPOFOL 500 MG/50ML IV EMUL
INTRAVENOUS | Status: DC | PRN
  Administered 2019-09-03: 40 ug/kg/min via INTRAVENOUS

## 2019-09-03 MED ORDER — CEFAZOLIN SODIUM-DEXTROSE 2-4 GM/100ML-% IV SOLN
2.0000 g | INTRAVENOUS | Status: AC
  Administered 2019-09-03: 2 g via INTRAVENOUS

## 2019-09-03 MED ORDER — ONDANSETRON HCL 4 MG PO TABS: 4.0000 mg | ORAL_TABLET | Freq: Four times a day (QID) | ORAL | Status: DC | PRN

## 2019-09-03 MED ORDER — LACTATED RINGERS IV SOLN: INTRAVENOUS | Status: DC

## 2019-09-03 MED ORDER — OXYCODONE-ACETAMINOPHEN 5-325 MG PO TABS: 1.0000 | ORAL_TABLET | ORAL | Status: DC | PRN

## 2019-09-03 MED ORDER — DONEPEZIL HCL 5 MG PO TABS
5.0000 mg | ORAL_TABLET | Freq: Every day | ORAL | Status: DC
  Administered 2019-09-03 – 2019-09-04 (×2): 5 mg via ORAL
  Filled 2019-09-03 (×2): qty 1

## 2019-09-03 MED ORDER — LIDOCAINE 2% (20 MG/ML) 5 ML SYRINGE
INTRAMUSCULAR | Status: DC | PRN
  Administered 2019-09-03: 100 mg via INTRAVENOUS

## 2019-09-03 MED ORDER — ENALAPRIL MALEATE 20 MG PO TABS
20.0000 mg | ORAL_TABLET | Freq: Two times a day (BID) | ORAL | Status: DC
  Administered 2019-09-04: 20 mg via ORAL
  Filled 2019-09-03 (×3): qty 1

## 2019-09-03 MED ORDER — SUCCINYLCHOLINE CHLORIDE 200 MG/10ML IV SOSY
PREFILLED_SYRINGE | INTRAVENOUS | Status: DC | PRN
  Administered 2019-09-03: 140 mg via INTRAVENOUS

## 2019-09-03 MED ORDER — SODIUM CHLORIDE 0.9 % IV SOLN: 250.0000 mL | INTRAVENOUS | Status: DC

## 2019-09-03 MED ORDER — FENTANYL CITRATE (PF) 100 MCG/2ML IJ SOLN
INTRAMUSCULAR | Status: AC
  Filled 2019-09-03: qty 2

## 2019-09-03 MED ORDER — ONDANSETRON HCL 4 MG/2ML IJ SOLN
4.0000 mg | Freq: Once | INTRAMUSCULAR | Status: AC | PRN
  Administered 2019-09-03: 4 mg via INTRAVENOUS

## 2019-09-03 MED ORDER — CEFAZOLIN SODIUM-DEXTROSE 2-4 GM/100ML-% IV SOLN
INTRAVENOUS | Status: AC
  Filled 2019-09-03: qty 100

## 2019-09-03 MED ORDER — PROPOFOL 10 MG/ML IV BOLUS
INTRAVENOUS | Status: DC | PRN
  Administered 2019-09-03: 150 mg via INTRAVENOUS
  Administered 2019-09-03: 50 mg via INTRAVENOUS

## 2019-09-03 MED ORDER — SENNA 8.6 MG PO TABS
1.0000 | ORAL_TABLET | Freq: Two times a day (BID) | ORAL | Status: DC
  Administered 2019-09-04: 8.6 mg via ORAL
  Filled 2019-09-03 (×2): qty 1

## 2019-09-03 MED ORDER — BISACODYL 10 MG RE SUPP: 10.0000 mg | Freq: Every day | RECTAL | Status: DC | PRN

## 2019-09-03 MED ORDER — PROPOFOL 10 MG/ML IV BOLUS
INTRAVENOUS | Status: AC
  Filled 2019-09-03: qty 20

## 2019-09-03 MED ORDER — PHENOL 1.4 % MT LIQD: 1.0000 | OROMUCOSAL | Status: DC | PRN

## 2019-09-03 MED ORDER — HYDROMORPHONE HCL 1 MG/ML IJ SOLN
0.2500 mg | INTRAMUSCULAR | Status: DC | PRN
  Administered 2019-09-03: 0.25 mg via INTRAVENOUS

## 2019-09-03 MED ORDER — METHOCARBAMOL 500 MG PO TABS
500.0000 mg | ORAL_TABLET | Freq: Four times a day (QID) | ORAL | Status: DC | PRN
  Filled 2019-09-03: qty 1

## 2019-09-03 MED ORDER — PROGESTERONE MICRONIZED 100 MG PO CAPS
100.0000 mg | ORAL_CAPSULE | Freq: Every day | ORAL | Status: DC
  Filled 2019-09-03 (×2): qty 1

## 2019-09-03 MED ORDER — ACETAMINOPHEN 500 MG PO TABS
ORAL_TABLET | ORAL | Status: AC
  Administered 2019-09-03: 1000 mg via ORAL
  Filled 2019-09-03: qty 2

## 2019-09-03 MED ORDER — LIDOCAINE 2% (20 MG/ML) 5 ML SYRINGE
INTRAMUSCULAR | Status: AC
  Filled 2019-09-03: qty 10

## 2019-09-03 MED ORDER — SERTRALINE HCL 50 MG PO TABS
50.0000 mg | ORAL_TABLET | Freq: Every day | ORAL | Status: DC
  Administered 2019-09-04: 50 mg via ORAL
  Filled 2019-09-03: qty 1

## 2019-09-03 MED ORDER — PRAVASTATIN SODIUM 10 MG PO TABS
20.0000 mg | ORAL_TABLET | Freq: Every day | ORAL | Status: DC
  Administered 2019-09-03 – 2019-09-04 (×2): 20 mg via ORAL
  Filled 2019-09-03 (×2): qty 2

## 2019-09-03 MED ORDER — LIDOCAINE-EPINEPHRINE 1 %-1:100000 IJ SOLN
INTRAMUSCULAR | Status: AC
  Filled 2019-09-03: qty 1

## 2019-09-03 MED ORDER — METHOCARBAMOL 1000 MG/10ML IJ SOLN
500.0000 mg | Freq: Four times a day (QID) | INTRAVENOUS | Status: DC | PRN
  Filled 2019-09-03: qty 5

## 2019-09-03 MED ORDER — SODIUM CHLORIDE 0.9 % IV SOLN
INTRAVENOUS | Status: DC | PRN
  Administered 2019-09-03: 500 mL

## 2019-09-03 MED ORDER — 0.9 % SODIUM CHLORIDE (POUR BTL) OPTIME
TOPICAL | Status: DC | PRN
  Administered 2019-09-03: 1000 mL

## 2019-09-03 MED ORDER — KETOROLAC TROMETHAMINE 15 MG/ML IJ SOLN
INTRAMUSCULAR | Status: AC
  Filled 2019-09-03: qty 1

## 2019-09-03 MED ORDER — FENTANYL CITRATE (PF) 100 MCG/2ML IJ SOLN
25.0000 ug | INTRAMUSCULAR | Status: DC | PRN
  Administered 2019-09-03 (×4): 25 ug via INTRAVENOUS

## 2019-09-03 MED ORDER — FENTANYL CITRATE (PF) 100 MCG/2ML IJ SOLN
INTRAMUSCULAR | Status: DC | PRN
  Administered 2019-09-03: 50 ug via INTRAVENOUS
  Administered 2019-09-03: 100 ug via INTRAVENOUS
  Administered 2019-09-03 (×2): 50 ug via INTRAVENOUS

## 2019-09-03 MED ORDER — ALUM & MAG HYDROXIDE-SIMETH 200-200-20 MG/5ML PO SUSP: 30.0000 mL | Freq: Four times a day (QID) | ORAL | Status: DC | PRN

## 2019-09-03 MED ORDER — SODIUM CHLORIDE 0.9% FLUSH
3.0000 mL | Freq: Two times a day (BID) | INTRAVENOUS | Status: DC
  Administered 2019-09-03 – 2019-09-04 (×2): 3 mL via INTRAVENOUS

## 2019-09-03 MED ORDER — KETOROLAC TROMETHAMINE 15 MG/ML IJ SOLN
7.5000 mg | Freq: Four times a day (QID) | INTRAMUSCULAR | Status: AC
  Administered 2019-09-03 – 2019-09-04 (×4): 7.5 mg via INTRAVENOUS
  Filled 2019-09-03 (×4): qty 1

## 2019-09-03 MED ORDER — PROMETHAZINE HCL 25 MG/ML IJ SOLN
6.2500 mg | Freq: Once | INTRAMUSCULAR | Status: AC
  Administered 2019-09-03: 6.25 mg via INTRAVENOUS

## 2019-09-03 MED ORDER — BUPIVACAINE HCL (PF) 0.5 % IJ SOLN
INTRAMUSCULAR | Status: DC | PRN
  Administered 2019-09-03: 5 mL
  Administered 2019-09-03: 10 mL

## 2019-09-03 MED ORDER — FLEET ENEMA 7-19 GM/118ML RE ENEM: 1.0000 | ENEMA | Freq: Once | RECTAL | Status: DC | PRN

## 2019-09-03 MED ORDER — DOCUSATE SODIUM 100 MG PO CAPS
100.0000 mg | ORAL_CAPSULE | Freq: Two times a day (BID) | ORAL | Status: DC
  Administered 2019-09-04: 100 mg via ORAL
  Filled 2019-09-03 (×2): qty 1

## 2019-09-03 MED ORDER — ONDANSETRON HCL 4 MG/2ML IJ SOLN
4.0000 mg | Freq: Four times a day (QID) | INTRAMUSCULAR | Status: DC | PRN
  Administered 2019-09-03: 4 mg via INTRAVENOUS
  Filled 2019-09-03: qty 2

## 2019-09-03 MED ORDER — ESTRADIOL 1 MG PO TABS
0.5000 mg | ORAL_TABLET | Freq: Every day | ORAL | Status: DC
  Administered 2019-09-03 – 2019-09-04 (×2): 0.5 mg via ORAL
  Filled 2019-09-03 (×2): qty 0.5

## 2019-09-03 MED ORDER — CHLORHEXIDINE GLUCONATE CLOTH 2 % EX PADS: 6.0000 | MEDICATED_PAD | Freq: Every day | CUTANEOUS | Status: DC

## 2019-09-03 MED ORDER — ONDANSETRON HCL 4 MG/2ML IJ SOLN
INTRAMUSCULAR | Status: AC
  Filled 2019-09-03: qty 2

## 2019-09-03 MED ORDER — MENTHOL 3 MG MT LOZG: 1.0000 | LOZENGE | OROMUCOSAL | Status: DC | PRN

## 2019-09-03 MED ORDER — ACETAMINOPHEN 325 MG PO TABS: 650.0000 mg | ORAL_TABLET | ORAL | Status: DC | PRN

## 2019-09-03 MED ORDER — THROMBIN 5000 UNITS EX SOLR
CUTANEOUS | Status: AC
  Filled 2019-09-03: qty 5000

## 2019-09-03 MED ORDER — ACETAMINOPHEN 500 MG PO TABS: 1000.0000 mg | ORAL_TABLET | Freq: Once | ORAL | Status: AC

## 2019-09-03 MED ORDER — ONDANSETRON HCL 4 MG/2ML IJ SOLN
INTRAMUSCULAR | Status: DC | PRN
  Administered 2019-09-03: 4 mg via INTRAVENOUS

## 2019-09-03 MED ORDER — THROMBIN 5000 UNITS EX SOLR
OROMUCOSAL | Status: DC | PRN
  Administered 2019-09-03: 5 mL via TOPICAL

## 2019-09-03 MED ORDER — HYDROMORPHONE HCL 1 MG/ML IJ SOLN
INTRAMUSCULAR | Status: AC
  Filled 2019-09-03: qty 1

## 2019-09-03 MED ORDER — DEXAMETHASONE SODIUM PHOSPHATE 10 MG/ML IJ SOLN
INTRAMUSCULAR | Status: DC | PRN
  Administered 2019-09-03: 4 mg via INTRAVENOUS

## 2019-09-03 MED ORDER — PHENYLEPHRINE HCL-NACL 10-0.9 MG/250ML-% IV SOLN
INTRAVENOUS | Status: DC | PRN
  Administered 2019-09-03: 30 ug/min via INTRAVENOUS

## 2019-09-03 MED ORDER — POLYETHYLENE GLYCOL 3350 17 G PO PACK: 17.0000 g | PACK | Freq: Every day | ORAL | Status: DC | PRN

## 2019-09-03 MED ORDER — FENTANYL CITRATE (PF) 250 MCG/5ML IJ SOLN
INTRAMUSCULAR | Status: AC
  Filled 2019-09-03: qty 5

## 2019-09-03 MED ORDER — MORPHINE SULFATE (PF) 2 MG/ML IV SOLN
2.0000 mg | INTRAVENOUS | Status: DC | PRN
  Filled 2019-09-03: qty 1

## 2019-09-03 MED ORDER — SUCCINYLCHOLINE CHLORIDE 200 MG/10ML IV SOSY
PREFILLED_SYRINGE | INTRAVENOUS | Status: AC
  Filled 2019-09-03: qty 20

## 2019-09-03 MED ORDER — SODIUM CHLORIDE 0.9% FLUSH: 3.0000 mL | INTRAVENOUS | Status: DC | PRN

## 2019-09-03 SURGICAL SUPPLY — 55 items
BLADE CLIPPER SURG (BLADE) IMPLANT
BOLT 5.0X45 SPINAL (Bolt) ×3 IMPLANT
BOLT 5.0X50 SPINAL (Bolt) ×6 IMPLANT
BONE MATRIX OSTEOCEL PRO LRG (Bone Implant) ×3 IMPLANT
COVER WAND RF STERILE (DRAPES) IMPLANT
DERMABOND ADVANCED (GAUZE/BANDAGES/DRESSINGS) ×2
DERMABOND ADVANCED .7 DNX12 (GAUZE/BANDAGES/DRESSINGS) ×1 IMPLANT
DRAPE C-ARM 42X72 X-RAY (DRAPES) ×3 IMPLANT
DRAPE C-ARMOR (DRAPES) ×3 IMPLANT
DRAPE LAPAROTOMY 100X72X124 (DRAPES) ×3 IMPLANT
DURAPREP 26ML APPLICATOR (WOUND CARE) ×3 IMPLANT
ELECT REM PT RETURN 9FT ADLT (ELECTROSURGICAL) ×3
ELECTRODE REM PT RTRN 9FT ADLT (ELECTROSURGICAL) ×1 IMPLANT
GAUZE 4X4 16PLY RFD (DISPOSABLE) IMPLANT
GLOVE BIOGEL PI IND STRL 6.5 (GLOVE) ×1 IMPLANT
GLOVE BIOGEL PI IND STRL 7.0 (GLOVE) ×1 IMPLANT
GLOVE BIOGEL PI IND STRL 7.5 (GLOVE) ×1 IMPLANT
GLOVE BIOGEL PI IND STRL 8 (GLOVE) ×2 IMPLANT
GLOVE BIOGEL PI IND STRL 8.5 (GLOVE) ×1 IMPLANT
GLOVE BIOGEL PI INDICATOR 6.5 (GLOVE) ×2
GLOVE BIOGEL PI INDICATOR 7.0 (GLOVE) ×2
GLOVE BIOGEL PI INDICATOR 7.5 (GLOVE) ×2
GLOVE BIOGEL PI INDICATOR 8 (GLOVE) ×4
GLOVE BIOGEL PI INDICATOR 8.5 (GLOVE) ×2
GLOVE ECLIPSE 7.5 STRL STRAW (GLOVE) ×12 IMPLANT
GLOVE ECLIPSE 8.5 STRL (GLOVE) ×3 IMPLANT
GLOVE EXAM NITRILE XL STR (GLOVE) IMPLANT
GLOVE SURG SS PI 6.0 STRL IVOR (GLOVE) ×9 IMPLANT
GOWN STRL REUS W/ TWL LRG LVL3 (GOWN DISPOSABLE) ×1 IMPLANT
GOWN STRL REUS W/ TWL XL LVL3 (GOWN DISPOSABLE) ×1 IMPLANT
GOWN STRL REUS W/TWL 2XL LVL3 (GOWN DISPOSABLE) ×9 IMPLANT
GOWN STRL REUS W/TWL LRG LVL3 (GOWN DISPOSABLE) ×3
GOWN STRL REUS W/TWL XL LVL3 (GOWN DISPOSABLE) ×3
HEMOSTAT POWDER KIT SURGIFOAM (HEMOSTASIS) ×3 IMPLANT
KIT BASIN OR (CUSTOM PROCEDURE TRAY) ×3 IMPLANT
KIT DILATOR XLIF 5 (KITS) ×2 IMPLANT
KIT SURGICAL ACCESS MAXCESS 4 (KITS) ×3 IMPLANT
KIT TURNOVER KIT B (KITS) ×3 IMPLANT
KIT XLIF (KITS) ×1
MODULE NVM5 NEXT GEN EMG (NEEDLE) ×3 IMPLANT
MODULUS XLW 10X22X50MM 10DEG (Spine Construct) ×3 IMPLANT
NEEDLE HYPO 25X1 1.5 SAFETY (NEEDLE) ×3 IMPLANT
NS IRRIG 1000ML POUR BTL (IV SOLUTION) ×3 IMPLANT
PACK LAMINECTOMY NEURO (CUSTOM PROCEDURE TRAY) ×3 IMPLANT
PLATE DECADE 4HOLE SZ12 XLIF (Plate) ×3 IMPLANT
SCREW DECADE 5.5X50 (Screw) ×6 IMPLANT
SPONGE LAP 4X18 RFD (DISPOSABLE) IMPLANT
SUT VIC AB 2-0 CP2 18 (SUTURE) ×3 IMPLANT
SUT VIC AB 3-0 SH 8-18 (SUTURE) ×3 IMPLANT
SUT VIC AB 4-0 RB1 18 (SUTURE) ×3 IMPLANT
TAPE CLOTH 4X10 WHT NS (GAUZE/BANDAGES/DRESSINGS) ×6 IMPLANT
TOWEL GREEN STERILE (TOWEL DISPOSABLE) ×3 IMPLANT
TOWEL GREEN STERILE FF (TOWEL DISPOSABLE) ×3 IMPLANT
TRAY FOLEY MTR SLVR 16FR STAT (SET/KITS/TRAYS/PACK) ×3 IMPLANT
WATER STERILE IRR 1000ML POUR (IV SOLUTION) ×3 IMPLANT

## 2019-09-03 NOTE — Transfer of Care (Signed)
Immediate Anesthesia Transfer of Care Note  Patient: [name]  Procedure(s) Performed: Lumbar one-two Anterolateral lumbar interbody fusion with lateral plate (N/A Spine Lumbar)  Patient Location: PACU  Anesthesia Type:General  Level of Consciousness: awake, alert  and oriented  Airway & Oxygen Therapy: Patient Spontanous Breathing and Patient connected to nasal cannula oxygen  Post-op Assessment: Report given to RN, Post -op Vital signs reviewed and stable and Patient moving all extremities X 4  Post vital signs: Reviewed and stable  Last Vitals:  Vitals Value Taken Time  BP 116/83 09/03/19 1405  Temp    Pulse 129 09/03/19 1407  Resp 24 09/03/19 1407  SpO2 100 % 09/03/19 1407  Vitals shown include unvalidated device data.  Last Pain:  Vitals:   09/03/19 0939  TempSrc:   PainSc: 5       Patients Stated Pain Goal: 5 (09/03/19 0939)  Complications: No apparent anesthesia complications

## 2019-09-03 NOTE — Progress Notes (Signed)
Patient ID: [name], female   DOB: 04-17-1941, 79 y.o.   MRN: [id number] Vital signs are stable She is arousable Complains of pain in her side Moving both lower extremities well

## 2019-09-03 NOTE — Progress Notes (Signed)
[name] is a 79 y.o. female patient admitted from Pacu awake,  alert - oriented  X 4 - no acute distress noted.  VSS - Blood pressure (!) 175/72, pulse 70, temperature 98.1 F (36.7 C), temperature source Oral, resp. rate 18, height 5\' 7"  (1.702 m), weight 80.7 kg, last menstrual period 06/06/1996, SpO2 98 %.    IV in place, occlusive dsg intact without redness.  Orientation to room, and floor completed.  Patient declined safety video at this time.  Admission INP armband ID verified with patient, and in place.    fall assessment complete, with patient, able to verbalize understanding of risk associated with falls, and verbalized understanding to call nsg before up out of bed.  Call light within reach, patient able to voice, and demonstrate understanding.  Skin, clean-dry- intact without evidence of bruising, or skin tears.  Skin glue is noted on surgical site No evidence of skin break down noted on exam.     Will cont to eval and treat per MD orders.  08/04/1996, RN 09/03/2019 7:26 PMReport received from

## 2019-09-03 NOTE — Op Note (Signed)
Date of surgery: 09/03/2019 Preoperative diagnosis: Spondylosis with stenosis L1-L2.  History of fusion L2 to sacrum.  Kyphosis Postoperative diagnosis: Same Procedure: Anterolateral decompression L1-L2 arthrodesis with XLIF spacer and allograft lateral plate fixation T4-H9 fluoroscopic image guidance and EMG neuro monitoring. Surgeon: Barnett Abu Anesthesia: General endotracheal Indications: [name] is a 79 year old individual who has had a significant spondylosis with a history of a decompression from L2 down to the sacrum with stabilization to the ileum.  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  Procedure: Patient was brought to the operating room supine on the stretcher.  After the smooth induction of general endotracheal anesthesia she was turned to the right lateral decubitus position.  Fluoroscopic guidance was used to make sure the patient was placed on the table in orthogonal position and the skin above the area for entry was marked the area was prepped with alcohol DuraPrep and draped in a sterile fashion on the lateral aspect of her back on the left side.  Also a monitoring incision was placed in the paraspinous region on the left side.  Both incisions were opened and the dissection was taken down to to the deep fascia.  The retroperitoneal fascia was then entered with a blunt Metallurgist and a finger was then inserted into the retroperitoneal space to feel the undersurface of the ribs the transverse process in the upper lumbar spine.  The lateral incision was then probed with a probe and then by guiding with a finger probe was passed under the 11th rib and down to the area of the L1-L2 disc space.  The disc space was identified positively and a K wire was passed into it.  Then by doing a series of stimulations a  series of dilator tubes were opened and the area around this was stimulated with EMG probes to make sure that there is no elements of the upper lumbar plexus that were being irritated close by when none were verified then 130 mm lateral retractor was placed over the outer cannula and docked onto the L1-L2 space.  The retractor was held to the operating table with a clamp the retractor was opened slightly and the area was inspected carefully to make sure no elements of the lumbar plexus were nearby and the shim was placed into the disc space at L1-L2 then by opening the retractor further dissect a few slips of muscle that had entered over the lateral aspect of the disc space and that the space was opened with a #15 blade combination of curettes and rongeurs was used to evacuate a substantial quantity of severely degenerated disc material at L1-L2 because of the kyphotic deformity that was forming there was significant opening on this left side closure on the right side we were able then to pass a Cobb elevator to the contralateral side and open the ligament on that side a series of dilators were then passed and at the 10 mm mark pop was felt that released the contralateral and anterior ligament and this released a good bit of the kyphosis and scoliosis that was forming at this level.  The endplates were then rongeured smooth to remove any articular cartilage and ultimately were able to place a 10 mm tall 10 degree lordotic spacer measuring 50 mm in diameter with 22 mm anterior posterior space  this was filled with ostia cell allograft and then carefully placed into the interspace under fluoroscopic visualization once placed and secured a 12 mm tall lateral plate with 4 holes was placed over the construct with the screws passing under the previously placed pedicle screws at L2.  50 mm screw measuring 5 mm in diameter was placed ventrally on the plate at L2 a 50 mm screw was then placed ventrally at L1 and a 45 mm screw  was placed dorsally at L1 measuring 4.5 mm in diameter and 4.5 x 50 mm screw was placed dorsally at L2.  These were then locked down sequentially the table was unbroken to further reduce the kyphoscoliosis at the L1-L2 level and then the plate was locked in the position once this was completed the area was carefully inspected the retractors were removed and then the fascia was closed with 2-0 Vicryl interrupted fashion and 3-0 Vicryl was used in the subcuticular skin 4-0 Vicryl was used for the final subcuticular closure.  Blood loss is estimated at less than 50 cc.  Patient was returned to the recovery room in stable condition.

## 2019-09-03 NOTE — Anesthesia Procedure Notes (Signed)
Procedure Name: Intubation Date/Time: 09/03/2019 11:47 AM Performed by: Colin Benton, CRNA Pre-anesthesia Checklist: Patient identified, Emergency Drugs available, Suction available and Patient being monitored Patient Re-evaluated:Patient Re-evaluated prior to induction Oxygen Delivery Method: Circle system utilized Preoxygenation: Pre-oxygenation with 100% oxygen Induction Type: IV induction Ventilation: Mask ventilation without difficulty Laryngoscope Size: Mac and 3 Grade View: Grade I Tube type: Oral Tube size: 7.0 mm Number of attempts: 1 Airway Equipment and Method: Stylet Placement Confirmation: ETT inserted through vocal cords under direct vision,  positive ETCO2 and breath sounds checked- equal and bilateral Secured at: 21 cm Tube secured with: Tape Dental Injury: Teeth and Oropharynx as per pre-operative assessment

## 2019-09-03 NOTE — Plan of Care (Signed)
  Problem: Safety: Goal: Ability to remain free from injury will improve Outcome: Progressing   

## 2019-09-03 NOTE — Anesthesia Preprocedure Evaluation (Addendum)
Anesthesia Evaluation  Patient identified by MRN, date of birth, ID band Patient awake    Reviewed: Allergy & Precautions, NPO status , Patient's Chart, lab work & pertinent test results  History of Anesthesia Complications Negative for: history of anesthetic complications  Airway Mallampati: II  TM Distance: >3 FB Neck ROM: Full    Dental  (+) Teeth Intact, Dental Advisory Given   Pulmonary neg pulmonary ROS,    Pulmonary exam normal breath sounds clear to auscultation       Cardiovascular hypertension, Pt. on medications Normal cardiovascular exam Rhythm:Regular Rate:Normal     Neuro/Psych PSYCHIATRIC DISORDERS Anxiety Degeneration of intervertebral disc of lumbar region  Neuromuscular disease    GI/Hepatic negative GI ROS, Neg liver ROS,   Endo/Other  negative endocrine ROS  Renal/GU negative Renal ROS     Musculoskeletal  (+) Arthritis ,   Abdominal   Peds  Hematology negative hematology ROS (+)   Anesthesia Other Findings Day of surgery medications reviewed with the patient.  Reproductive/Obstetrics                           Anesthesia Physical Anesthesia Plan  ASA: II  Anesthesia Plan: General   Post-op Pain Management:    Induction: Intravenous  PONV Risk Score and Plan: 3 and Dexamethasone, Ondansetron and Treatment may vary due to age or medical condition  Airway Management Planned: Oral ETT  Additional Equipment:   Intra-op Plan:   Post-operative Plan: Extubation in OR  Informed Consent: I have reviewed the patients History and Physical, chart, labs and discussed the procedure including the risks, benefits and alternatives for the proposed anesthesia with the patient or authorized representative who has indicated his/her understanding and acceptance.     Dental advisory given  Plan Discussed with: CRNA  Anesthesia Plan Comments:         Anesthesia Quick  Evaluation

## 2019-09-03 NOTE — Progress Notes (Signed)
Patient arrived to short stay with elevated BP.  Dr. Desmond Lope made aware.  No new orders received.  Will continue to monitor patient.

## 2019-09-03 NOTE — Anesthesia Postprocedure Evaluation (Signed)
Anesthesia Post Note  Patient: [name]  Procedure(s) Performed: Lumbar one-two Anterolateral lumbar interbody fusion with lateral plate (N/A Spine Lumbar)     Patient location during evaluation: PACU Anesthesia Type: General Level of consciousness: awake and alert Pain management: pain level controlled Vital Signs Assessment: post-procedure vital signs reviewed and stable Respiratory status: spontaneous breathing, nonlabored ventilation, respiratory function stable and patient connected to nasal cannula oxygen Cardiovascular status: blood pressure returned to baseline and stable Anesthetic complications: no    Last Vitals:  Vitals:   09/03/19 1540 09/03/19 1620  BP: (!) 153/67 (!) 175/73  Pulse: 62 66  Resp: 15 17  Temp: (!) 36.1 C   SpO2: 100% 100%    Last Pain:  Vitals:   09/03/19 1540  TempSrc:   PainSc: Asleep                 Cecile Hearing

## 2019-09-03 NOTE — H&P (Signed)
[name] is an 79 y.o. female.   Chief Complaint: Centralized back pain with difficulty standing straight up. HPI: [name] is a 79 year old individual who has had significant spondylitic disease in the past.  About 8 years ago she underwent a decompression and fusion from L2-L5 secondary to advanced spondylitic stenosis along with an early degenerative scoliosis.  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  After efforts at conservative management have not yielded good relief she was advised regarding an anterolateral decompression and fusion at L1-L2.  She is admitted for that surgery.  Past Medical History:  Diagnosis Date  . Anxiety   . Arthritis   . Bartholin's gland abscess 04/2017   Left, incised and drained  . High cholesterol   . History of iron deficiency anemia   . Hypertension     Past Surgical History:  Procedure Laterality Date  . BACK SURGERY    . BREAST EXCISIONAL BIOPSY Right    x 2  . BUNIONECTOMY    . CHOLECYSTECTOMY    . COLONOSCOPY    . DIAGNOSTIC LAPAROSCOPY    . EYE SURGERY Bilateral    CATARACT SURGERY  . LUMBAR FUSION  2011  . OPEN SURGICAL REPAIR OF GLUTEAL TENDON Right 01/14/2019   Procedure: Right hip bursectomy; gluteal tendon repair;  Surgeon: Ollen Gross, MD;  Location: [facility];  Service: Orthopedics;  Laterality: Right;   . right knee replacement   2011  . TONSILLECTOMY AND ADENOIDECTOMY    . TOTAL KNEE ARTHROPLASTY Left 09/19/2016   Procedure: LEFT TOTAL KNEE ARTHROPLASTY;  Surgeon: Ollen Gross, MD;  Location: [facility];   Service: Orthopedics;  Laterality: Left;  requests with abductor block    Family History  Problem Relation Age of Onset  . Stroke Mother   . Cancer Father        colon  . Diabetes Father   . Breast cancer Maternal Aunt   . Breast cancer Maternal Grandmother    Social History:  reports that she has never smoked. She has never used smokeless tobacco. She reports that she does not drink alcohol or use drugs.  Allergies: No Known Allergies  No medications prior to admission.    No results found for this or any previous visit (from the past 48 hour(s)). No results found.  Review of Systems  Constitutional: Positive for activity change.  HENT: Negative.   Eyes: Negative.   Respiratory: Negative.   Cardiovascular: Negative.   Gastrointestinal: Negative.   Endocrine: Negative.   Genitourinary: Negative.   Musculoskeletal: Positive for back pain.  Allergic/Immunologic: Negative.   Neurological: Positive for weakness and numbness.  Hematological: Negative.   Psychiatric/Behavioral: Negative.     Last menstrual period 06/06/1996. Physical Exam  Constitutional: She is oriented to person, place, and time. She appears well-developed and well-nourished.  HENT:  Head: Normocephalic and atraumatic.  Eyes: Pupils are equal, round, and reactive to light. Conjunctivae and EOM are normal.  Cardiovascular: Normal rate and regular rhythm.  Respiratory: Effort normal and breath sounds normal.  GI: Soft. Bowel sounds are  normal.  Musculoskeletal:     Cervical back: Normal range of motion and neck supple.     Comments: Forward stooped posture is noted straight leg raising is positive at 30 degrees in either lower extremities Patrick's maneuver is negative.  Neurological: She is alert and oriented to person, place, and time.  Absent deep tendon reflexes in both lower extremities.  Station and gait are intact though patient's posture demonstrates a marked forward stoop of approximately  15 degrees.  Standing straight and erect exacerbates significant back pain and leg pain.  Skin: Skin is warm and dry.  Psychiatric: She has a normal mood and affect. Her behavior is normal. Judgment and thought content normal.     Assessment/Plan Spondylosis and stenosis L1-L2.  Anterolateral decompression L1-L2 with lateral plate fixation.  Earleen Newport, MD 09/03/2019, 7:48 AM

## 2019-09-04 ENCOUNTER — Encounter: Payer: Self-pay | Admitting: *Deleted

## 2019-09-04 MED ORDER — OXYCODONE-ACETAMINOPHEN 5-325 MG PO TABS: 1.0000 | ORAL_TABLET | ORAL | 0 refills | Status: DC | PRN

## 2019-09-04 MED ORDER — METHOCARBAMOL 500 MG PO TABS: 500.0000 mg | ORAL_TABLET | Freq: Four times a day (QID) | ORAL | 3 refills | Status: DC | PRN

## 2019-09-04 NOTE — Progress Notes (Signed)
Occupational Therapy Evaluation Patient Details Name: [name] MRN: [id number] DOB: 16-Sep-1940 Today's Date: 09/04/2019    History of Present Illness Pt is a 79 y.o. F with significant PMH of prior fusion L2 to sacrum who presents s/p Anterolateral decompression L1-L2 arthrodesis with XLIF spacer and allograft lateral plate fixation.    Clinical Impression   PTA pt lived alone, independent in all ADL and mobility tasks. Pt ambulates with a cane and reports 0 falls in the last 6 months. Pt currently independent to min assist for self-care and functional transfer tasks. Pt able to recall 3/3 spinal precautions, however required mod cues to adhere to during therapy tasks. Pt engaged in total body dressing task requiring min assist and cues to manage brace and position correctly on back. Pt able to ambulate to/from bathroom with cane and min guard. Noted 0 instances of LOB, however pt unsteady on feet. Pt completed toilet transfer reporting increased dizziness and nausea while ambulating back to bed. BP taken and found to be 108/16mmHg. Consulted with RN. Pt assisted back to bed and positioned for comfort. Educated/instructed pt on safety strategies, activity pacing, and energy conservation techniques with fair understanding. Pt demonstrates decreased strength, endurance, balance, standing tolerance, activity tolerance, and safety awareness impacting ability to complete self-care and functional transfer tasks. Recommend skilled OT services to address above deficits in order to promote function and prevent further decline. Recommend HH OT for continued rehab following hospital discharge.     Follow Up Recommendations  Home health OT;Supervision/Assistance - 24 hour(for the first few days)    Equipment Recommendations  None recommended by OT    Recommendations for Other Services       Precautions / Restrictions Precautions Precautions: Fall;Back Precaution Booklet Issued:  No Precaution Comments: Pt recalling 3/3 spinal precautions Required Braces or Orthoses: Spinal Brace Spinal Brace: Lumbar corset;Applied in sitting position Restrictions Weight Bearing Restrictions: No      Mobility Bed Mobility               General bed mobility comments: Pt seated EOB upon arrival  Transfers Overall transfer level: Needs assistance Equipment used: Straight cane Transfers: Sit to/from Stand Sit to Stand: Supervision         General transfer comment: To ensure balance and safety    Balance Overall balance assessment: Needs assistance Sitting-balance support: Feet supported Sitting balance-Leahy Scale: Good     Standing balance support: Single extremity supported;During functional activity Standing balance-Leahy Scale: Fair                             ADL either performed or assessed with clinical judgement   ADL Overall ADL's : Needs assistance/impaired Eating/Feeding: Independent;Sitting   Grooming: Set up;Supervision/safety;Sitting   Upper Body Bathing: Set up;Supervision/ safety;Sitting   Lower Body Bathing: Set up;Supervison/ safety;Sit to/from stand   Upper Body Dressing : Sitting;Minimal assistance Upper Body Dressing Details (indicate cue type and reason): To manage bra and ensure back brace was positioned correctly.  Lower Body Dressing: Sit to/from stand;Set up;Supervision/safety   Toilet Transfer: Ambulation;Min guard;Regular Toilet;Grab bars   Toileting- Clothing Manipulation and Hygiene: Supervision/safety;Sit to/from stand       Functional mobility during ADLs: Min guard;Cane General ADL Comments: Pt able to ambulate to/from bathroom with cane and min guard. Noted 0 instances of LOB, however pt unsteady on feet. While ambulating back to bed pt reported feeling hot, nauseous, and dizzy. BP 108/56mmHg. RN notified.  Vision Baseline Vision/History: Wears glasses Wears Glasses: Reading only        Perception     Praxis      Pertinent Vitals/Pain Pain Assessment: 0-10 Pain Score: 5  Pain Location: Back Pain Descriptors / Indicators: Aching Pain Intervention(s): Limited activity within patient's tolerance;Monitored during session;Repositioned     Hand Dominance Right   Extremity/Trunk Assessment Upper Extremity Assessment Upper Extremity Assessment: Overall WFL for tasks assessed   Lower Extremity Assessment Lower Extremity Assessment: Defer to PT evaluation   Cervical / Trunk Assessment Cervical / Trunk Assessment: Other exceptions;Kyphotic Cervical / Trunk Exceptions: s/p XLIF   Communication Communication Communication: No difficulties   Cognition Arousal/Alertness: Awake/alert Behavior During Therapy: WFL for tasks assessed/performed Overall Cognitive Status: No family/caregiver present to determine baseline cognitive functioning                                 General Comments: A&O x 4. Pt pleasant and willing to participate in therapy. Pt required mod cues to adhere to spinal precautions during ADLs and transfers.    General Comments  Consulted with RN due to low BP with pt symptomatic during activity.     Exercises     Shoulder Instructions      Home Living Family/patient expects to be discharged to:: Private residence Living Arrangements: Alone Available Help at Discharge: Neighbor;Available PRN/intermittently;Friend(s) Type of Home: House Home Access: Level entry     Home Layout: One level     Bathroom Shower/Tub: Producer, television/film/video: Handicapped height     Home Equipment: Shower seat - built in;Bedside commode;Walker - 2 wheels;Walker - 4 wheels;Cane - single point;Grab bars - tub/shower          Prior Functioning/Environment Level of Independence: Independent with assistive device(s)        Comments: Pt independent in all ADLs. Pt still drives and ambulates with a cane. Pt reports 0 falls in the last 6  months. Pt has a housekeeper as well as friends that assist her with grocery shopping.         OT Problem List: Decreased activity tolerance;Impaired balance (sitting and/or standing);Decreased safety awareness;Decreased strength      OT Treatment/Interventions: Self-care/ADL training;Therapeutic exercise;Neuromuscular education;Energy conservation;DME and/or AE instruction;Therapeutic activities;Patient/family education;Balance training    OT Goals(Current goals can be found in the care plan section) Acute Rehab OT Goals Patient Stated Goal: return home today Time For Goal Achievement: 09/18/19 Potential to Achieve Goals: Good ADL Goals Pt Will Perform Tub/Shower Transfer: Shower transfer;with supervision;ambulating;grab bars Additional ADL Goal #1: Pt to complete all ADLs with modified independence and 0 instances of LOB. Additional ADL Goal #2: Pt to recall and demonstrate adherence to spinal precautions with 0 verbal cues. Additional ADL Goal #3: Pt to tolerate standing up to 10 min with modified independence in preparation for ADLs. Additional ADL Goal #4: Pt to recall and verbalize 3 fall prevention strategies with 0 verbal cues.  OT Frequency: Min 3X/week   Barriers to D/C:            Co-evaluation              AM-PAC OT "6 Clicks" Daily Activity     Outcome Measure Help from another person eating meals?: None Help from another person taking care of personal grooming?: A Little Help from another person toileting, which includes using toliet, bedpan, or urinal?: A Little Help from another person  bathing (including washing, rinsing, drying)?: A Little Help from another person to put on and taking off regular upper body clothing?: A Little Help from another person to put on and taking off regular lower body clothing?: A Little 6 Click Score: 19   End of Session Equipment Utilized During Treatment: Back brace Nurse Communication: Mobility status  Activity Tolerance:  Other (comment)(Limited by dizziness and nausea) Patient left: in bed;with call bell/phone within reach;with bed alarm set  OT Visit Diagnosis: Unsteadiness on feet (R26.81);Muscle weakness (generalized) (M62.81)                Time: 7628-3151 OT Time Calculation (min): 34 min Charges:  OT General Charges $OT Visit: 1 Visit OT Evaluation $OT Eval Low Complexity: 1 Low OT Treatments $Self Care/Home Management : 8-22 mins  Mauri Brooklyn OTR/L [phone]  Mauri Brooklyn 09/04/2019, 1:26 PM

## 2019-09-04 NOTE — Progress Notes (Signed)
Per OT patient dizzy, nauseous and BP went down after she worked with her.  Both PT and OT recommend and advised patient to stay for another day as she will be by herself with no help after discharge.  Patient is adamant about disharge and stated  that she will be fine at home, this episode is because she work too much.  Dr. Danielle Dess notified of her episode.

## 2019-09-04 NOTE — Progress Notes (Signed)
Foley catheter d/c'd. Applied back brace, walked pt to nurse's station using a walker, pt sat in chair after walking, no complaints of pain.

## 2019-09-04 NOTE — Discharge Summary (Signed)
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  History of fusion L2 to sacrum. 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  History of fusion L2 to sacrum. 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  She had significant back pain at the thoracolumbar junction.  Postoperatively she has been able to stand straighter and she feels improved she has some surgical soreness in her back but overall feels better she is discharged home with medications including Percocet for pain #65/325 without refills and Robaxin 500 mg #41 every 6 hours as needed for pain.  She will be seen in the office in 2 weeks time.

## 2019-09-04 NOTE — Plan of Care (Signed)
  Problem: Safety: Goal: Ability to remain free from injury will improve 09/04/2019 1318 by Myriam Forehand, RN Outcome: Completed/Met 09/04/2019 1317 by Myriam Forehand, RN Outcome: Adequate for Discharge

## 2019-09-04 NOTE — Evaluation (Signed)
Physical Therapy Evaluation Patient Details Name: [name] MRN: [id number] DOB: [birth year] Today's Date: 09/04/2019   History of Present Illness  Pt is a 79 y.o. F with significant PMH of prior fusion L2 to sacrum who presents s/p Anterolateral decompression L1-L2 arthrodesis with XLIF spacer and allograft lateral plate fixation.   Clinical Impression  Patient is s/p above surgery resulting in the deficits listed below (see PT Problem List). Pt presents with expected post surgical pain, balance impairments, decreased gait speed and weakness. Ambulating 80 feet with a cane at a supervision level. Education provided regarding spinal precautions, activity recommendations and progression, and use of walker if needed for pain control. Patient will benefit from skilled PT to increase their independence and safety with mobility (while adhering to their precautions) to allow discharge to the venue listed below.       Follow Up Recommendations Home health PT;Supervision/Assistance - 24 hour (initially)    Equipment Recommendations  None recommended by PT    Recommendations for Other Services       Precautions / Restrictions Precautions Precautions: Fall;Back Precaution Booklet Issued: No Precaution Comments: Pt recalling 3/3 spinal precautions Required Braces or Orthoses: Spinal Brace Spinal Brace: Lumbar corset;Applied in sitting position Restrictions Weight Bearing Restrictions: No      Mobility  Bed Mobility               General bed mobility comments: Pt seated EOB upon arrival  Transfers Overall transfer level: Needs assistance Equipment used: None Transfers: Sit to/from Stand Sit to Stand: Supervision            Ambulation/Gait Ambulation/Gait assistance: Supervision Gait Distance (Feet): 80 Feet Assistive device: Straight cane Gait Pattern/deviations: Step-through pattern;Decreased stride length Gait velocity: decreased   General Gait Details:  Slow pace, no overt LOB  Stairs            Wheelchair Mobility    Modified Rankin (Stroke Patients Only)       Balance Overall balance assessment: Needs assistance Sitting-balance support: Feet supported Sitting balance-Leahy Scale: Good     Standing balance support: Single extremity supported;During functional activity Standing balance-Leahy Scale: Fair                               Pertinent Vitals/Pain Pain Assessment: 0-10 Pain Score: 5  Pain Location: Back Pain Descriptors / Indicators: Aching Pain Intervention(s): Monitored during session;Repositioned    Home Living Family/patient expects to be discharged to:: Private residence Living Arrangements: Alone Available Help at Discharge: Neighbor;Available PRN/intermittently;Friend(s) Type of Home: House Home Access: Level entry     Home Layout: One level Home Equipment: Shower seat - built in;Bedside commode;Walker - 2 wheels;Walker - 4 wheels;Cane - single point;Grab bars - tub/shower      Prior Function Level of Independence: Independent with assistive device(s)         Comments: Uses cane for community mobility, drives. Has housekeeper, has friends who assist with grocery shopping     Hand Dominance   Dominant Hand: Right    Extremity/Trunk Assessment   Upper Extremity Assessment Upper Extremity Assessment: Overall WFL for tasks assessed    Lower Extremity Assessment Lower Extremity Assessment: Overall WFL for tasks assessed    Cervical / Trunk Assessment Cervical / Trunk Assessment: Other exceptions;Kyphotic Cervical / Trunk Exceptions: s/p XLIF  Communication   Communication: No difficulties  Cognition Arousal/Alertness: Awake/alert Behavior During Therapy: WFL for tasks assessed/performed Overall Cognitive Status:  Within Functional Limits for tasks assessed                                        General Comments      Exercises     Assessment/Plan     PT Assessment Patient needs continued PT services  PT Problem List Decreased strength;Decreased activity tolerance;Decreased balance;Decreased mobility;Pain       PT Treatment Interventions DME instruction;Gait training;Functional mobility training;Therapeutic activities;Therapeutic exercise;Balance training;Patient/family education    PT Goals (Current goals can be found in the Care Plan section)  Acute Rehab PT Goals Patient Stated Goal: return home today PT Goal Formulation: With patient Time For Goal Achievement: 09/18/19 Potential to Achieve Goals: Good    Frequency Min 5X/week   Barriers to discharge        Co-evaluation               AM-PAC PT "6 Clicks" Mobility  Outcome Measure Help needed turning from your back to your side while in a flat bed without using bedrails?: None Help needed moving from lying on your back to sitting on the side of a flat bed without using bedrails?: None Help needed moving to and from a bed to a chair (including a wheelchair)?: None Help needed standing up from a chair using your arms (e.g., wheelchair or bedside chair)?: None Help needed to walk in hospital room?: None Help needed climbing 3-5 steps with a railing? : A Little 6 Click Score: 23    End of Session Equipment Utilized During Treatment: Gait belt;Back brace Activity Tolerance: Patient tolerated treatment well Patient left: in bed;with call bell/phone within reach Nurse Communication: Mobility status PT Visit Diagnosis: Pain;Difficulty in walking, not elsewhere classified (R26.2);Unsteadiness on feet (R26.81) Pain - part of body: (back)    Time: 1000-1012 PT Time Calculation (min) (ACUTE ONLY): 12 min   Charges:   PT Evaluation $PT Eval Low Complexity: 1 Low            Lillia Pauls, PT, DPT Acute Rehabilitation Services Pager [phone] Office [phone]   [name] 09/04/2019, 11:24 AM

## 2019-09-04 NOTE — Plan of Care (Signed)
  Problem: Safety: Goal: Ability to remain free from injury will improve Outcome: Adequate for Discharge   

## 2019-09-04 NOTE — TOC Transition Note (Signed)
Transition of Care [facility]) - CM/SW Discharge Note   Patient Details  Name: Teaira Croft MRN: [id number] Date of Birth: March 12, 1941  [facility] CM/SW Contact:  Kermit Balo, RN Phone Number: 09/04/2019, 11:29 AM   Clinical Narrative:    Pt discharging home with [facility] services through [facility]. Pt has used [facility] in the past and would like to use them again. Cherly with [facility] accepted the referral.  Pt has transport home and is going to see if someone can stay with her some over the next few days.    Final next level of care: Home w Home Health Services Barriers to Discharge: No Barriers Identified   Patient Goals and CMS Choice   CMS Medicare.gov Compare Post Acute Care list provided to:: Patient Choice offered to / list presented to : Patient  Discharge Placement                       Discharge Plan and Services                          HH Arranged: PT, OT [facility] Agency: [facility] Date [facility] Agency Contacted: 09/04/19 Time HH Agency Contacted: [phone] Representative spoke with at [facility] Agency: Elnita Maxwell  Social Determinants of Health (SDOH) Interventions     Readmission Risk Interventions No flowsheet data found.

## 2019-09-04 NOTE — Progress Notes (Signed)
Vital signs are stable Incisions are clean and dry Motor function is intact Plan discharge today

## 2019-10-24 ENCOUNTER — Other Ambulatory Visit: Payer: Self-pay

## 2019-10-25 ENCOUNTER — Encounter: Payer: Self-pay | Admitting: Obstetrics and Gynecology

## 2019-10-25 ENCOUNTER — Ambulatory Visit: Payer: Medicare Other | Admitting: Obstetrics and Gynecology

## 2019-10-25 VITALS — BP 124/82

## 2019-10-25 DIAGNOSIS — L292 Pruritus vulvae: Secondary | ICD-10-CM | POA: Diagnosis not present

## 2019-10-25 DIAGNOSIS — B372 Candidiasis of skin and nail: Secondary | ICD-10-CM

## 2019-10-25 LAB — WET PREP FOR TRICH, YEAST, CLUE

## 2019-10-25 MED ORDER — NYSTATIN 100000 UNIT/GM EX OINT: 1.0000 "application " | TOPICAL_OINTMENT | Freq: Two times a day (BID) | CUTANEOUS | 0 refills | Status: DC

## 2019-10-25 NOTE — Progress Notes (Signed)
[name] [facility] 12-25-1940 [id number]  SUBJECTIVE:  79 y.o. G0P0 female presents for evaluation of vulvar itch and irritation.  She uses 0.5 mg estrace tablets PO daily.  Uses clobetasol ointment daily for a history of lichen sclerosis.  She does use poise pads as she does sometimes have trouble making it to the bathroom in time to void as she moves slowly because she has had back surgeries.  She has been lining the edges of the poise pad with the clobetasol meant which had helped the itching initially but now it is worse again.   Current Outpatient Medications  Medication Sig Dispense Refill  . aspirin EC 81 MG tablet Take 81 mg by mouth at bedtime.    . Cholecalciferol (VITAMIN D3) 125 MCG (5000 UT) CAPS Take 5,000 Units by mouth daily.    . clobetasol ointment (TEMOVATE) 0.05 % Apply topically 2 (two) times daily. As needed for itching 30 g 3  . desonide (DESOWEN) 0.05 % ointment Apply 1 application topically 2 (two) times daily as needed for rash.    . donepezil (ARICEPT) 5 MG tablet Take 5 mg by mouth daily.    . enalapril (VASOTEC) 20 MG tablet Take 20 mg by mouth 2 (two) times daily.     Marland Kitchen estradiol (ESTRACE) 0.5 MG tablet Take 1 tablet (0.5 mg total) by mouth daily. 90 tablet 4  . gabapentin (NEURONTIN) 300 MG capsule Take 300 mg by mouth 2 (two) times daily.    Marland Kitchen HYDROcodone-acetaminophen (NORCO/VICODIN) 5-325 MG tablet Take 1 tablet by mouth every 6 (six) hours as needed for moderate pain.    . meloxicam (MOBIC) 15 MG tablet Take 15 mg by mouth daily.    . methocarbamol (ROBAXIN) 500 MG tablet Take 1 tablet (500 mg total) by mouth every 6 (six) hours as needed for muscle spasms. 40 tablet 3  . pravastatin (PRAVACHOL) 20 MG tablet Take 20 mg by mouth daily.      . progesterone (PROMETRIUM) 100 MG capsule Take 1 capsule (100 mg total) by mouth at bedtime. 90 capsule 4  . sertraline (ZOLOFT) 50 MG tablet Take 50 mg by mouth daily.       No current facility-administered  medications for this visit.   Allergies: Patient has no known allergies.  Patient's last menstrual period was 06/06/1996.  Past medical history,surgical history, problem list, medications, allergies, family history and social history were all reviewed and documented as reviewed in the EPIC chart.   OBJECTIVE:  BP 124/82   LMP 06/06/1996  The patient appears well, alert, oriented x 3, in no distress. PELVIC EXAM: VULVA: Vulva with atrophic changes and symmetrical blanching of the skin consistent with appearance of lichen sclerosis, no masses, tenderness or lesions.  Over the left labia majora and into the left leg crease there is a strongly erythematous discoloration without exudate or purulence, suggestive of of candidiasis.  Slight erythematous discoloration around the anterior labia majora into the mons, in a distribution that would match where a pad would lay, WET MOUNT done - results: negative for pathogens, normal epithelial cells  Chaperone: [name] present during the examination  ASSESSMENT:  79 y.o. G0P0 here for vulvar pruritus with a history of lichen sclerosis and what appears to be candidiasis of the left leg crease  PLAN:  Discussed avoiding overuse of clobetasol.  I recommend decreasing use to only twice weekly if able, and only applying it in the midline vulvar structures.  We did show her the examination  findings using a hand-held mirror to illustrate the white changes of lichen sclerosis over the midline vulvar structures versus the erythematous changes that suggest a yeast infection and possible skin atrophy with overuse of the clobetasol toward the leg crease.  For the area that suggestive of skin candidiasis, I will give her a prescription for Mycolog ointment, I told her to only use plain Vaseline if she wants apply that around the edges of the pad to decrease irritation.  She will let us know if the symptoms do not get any better.  Joseph Pierini MD 10/25/19

## 2019-10-25 NOTE — Patient Instructions (Addendum)
For the reddened area that is suggestive of skin yeast over the left leg crease, use the Nystatin antifungal ointment 2-3 times per day Only use plain Vaseline if you want to apply that around the edges of the pads to decrease skin irritation.  Apply the clobetasol ointment only to the middle vulvar structures where the white changes were that we showed you on the mirror during the examination today

## 2020-01-16 IMAGING — MG MM DIGITAL DIAGNOSTIC UNILAT*L* W/ TOMO W/ CAD
8 series · 8 of 24 positions shown · non-contrast
Comparison: Previous exam(s).

CLINICAL DATA: Screening recall for a possible left breast
asymmetry.

EXAM:
DIGITAL DIAGNOSTIC LEFT MAMMOGRAM WITH CAD AND TOMO
ULTRASOUND LEFT BREAST

[L CC synth-2D (1 of 2)]
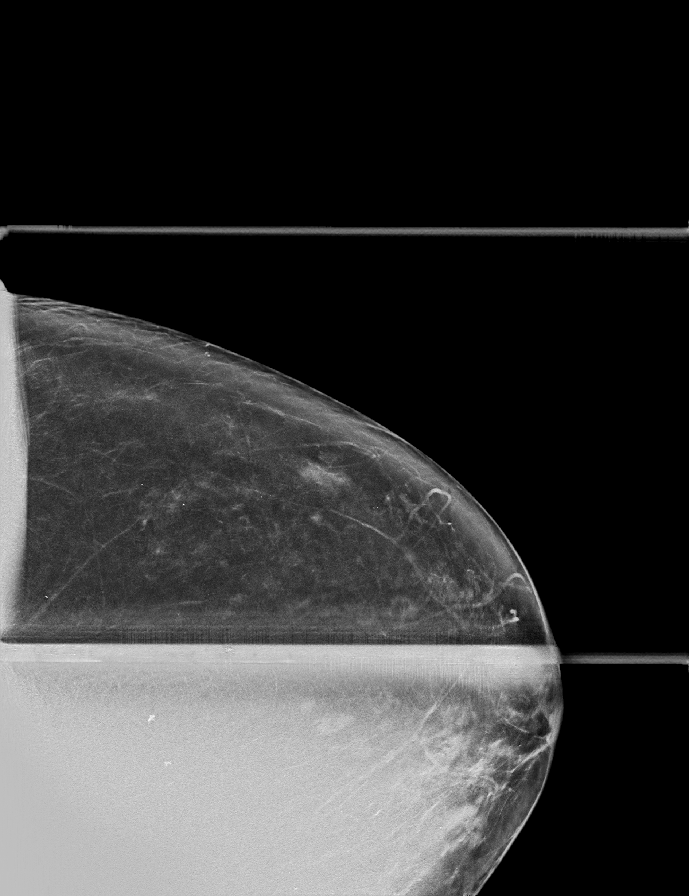

[L ML synth-2D (1 of 2)]
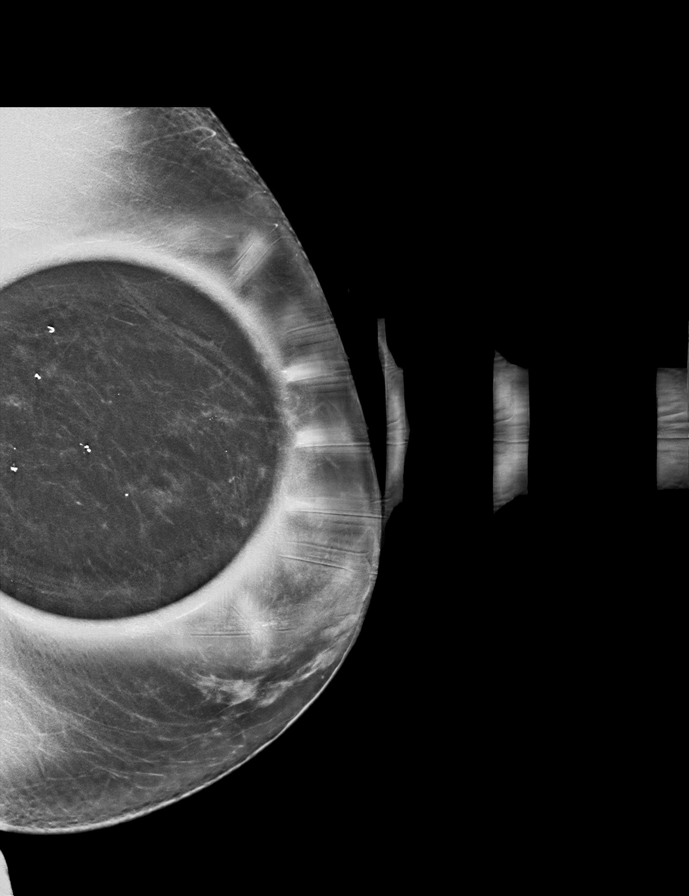

[L CC synth-2D (2 of 2)]
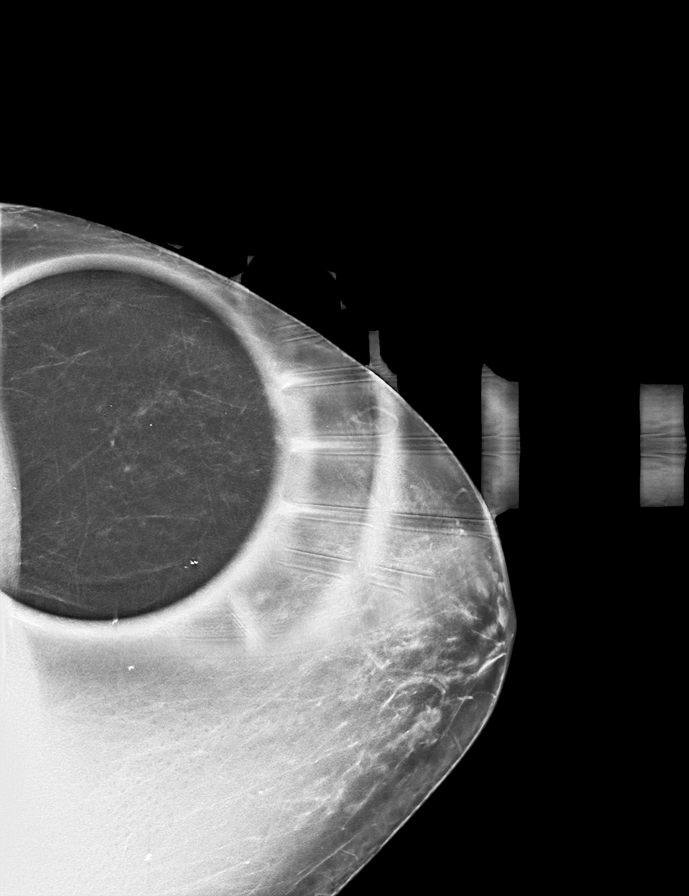

[L ML synth-2D (2 of 2)]
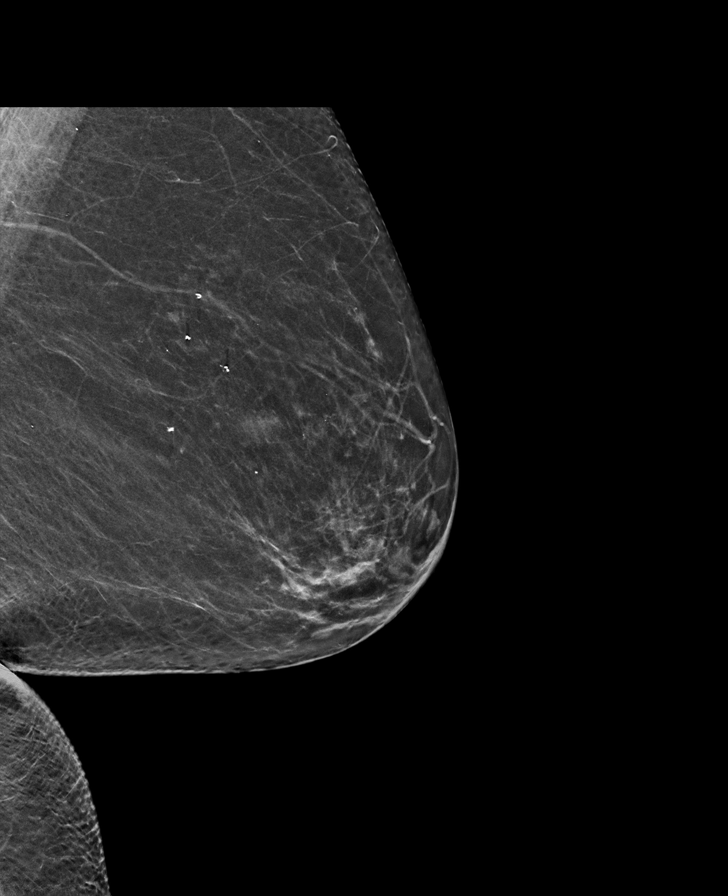

[L ML tomo (1 of 2) · tomo slice 33/65.0]
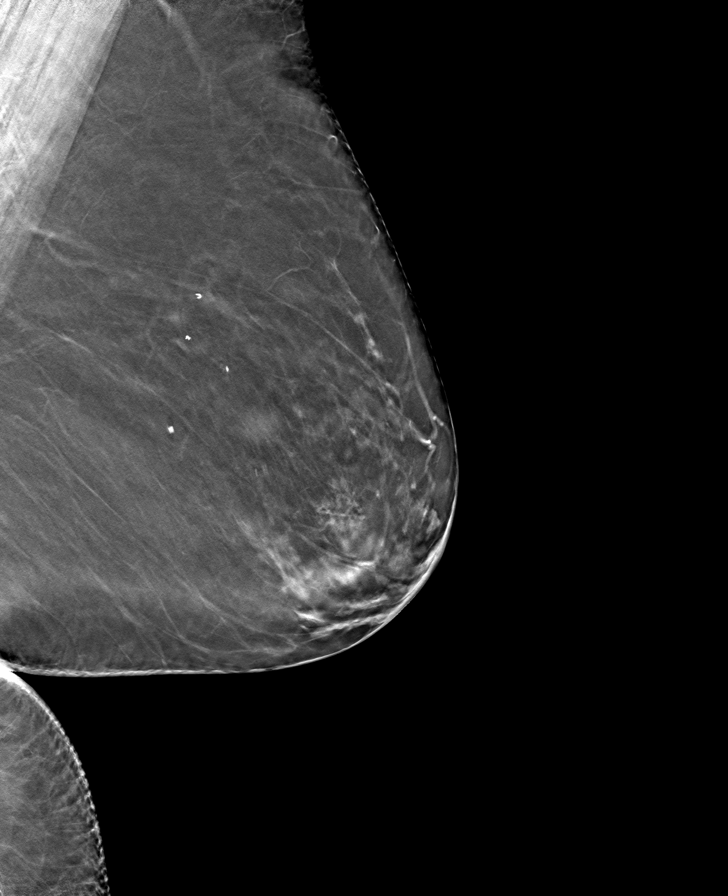

[L CC tomo (1 of 2) · tomo slice 29/58.0]
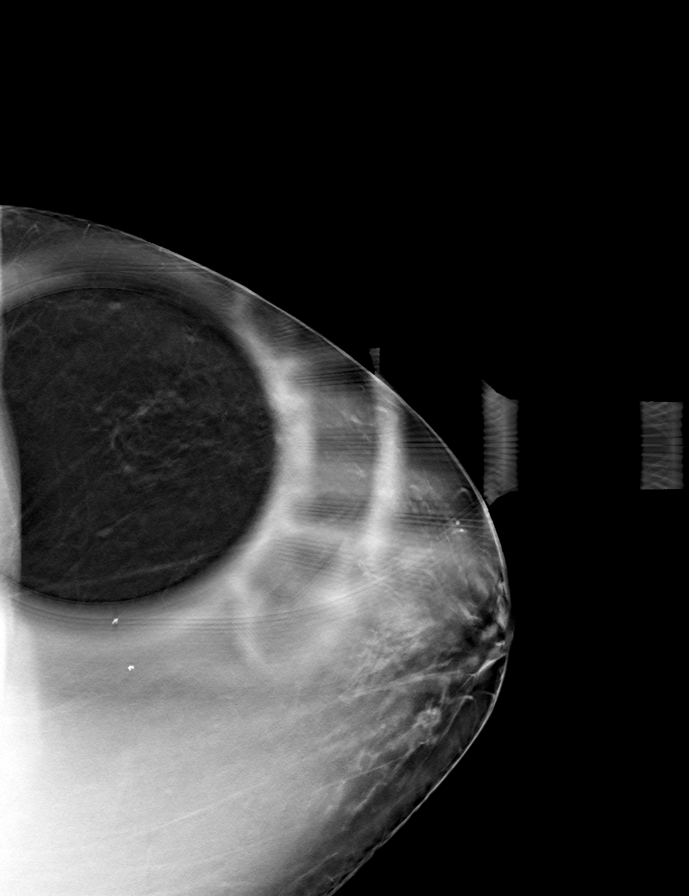

[L CC tomo (2 of 2) · tomo slice 30/59.0]
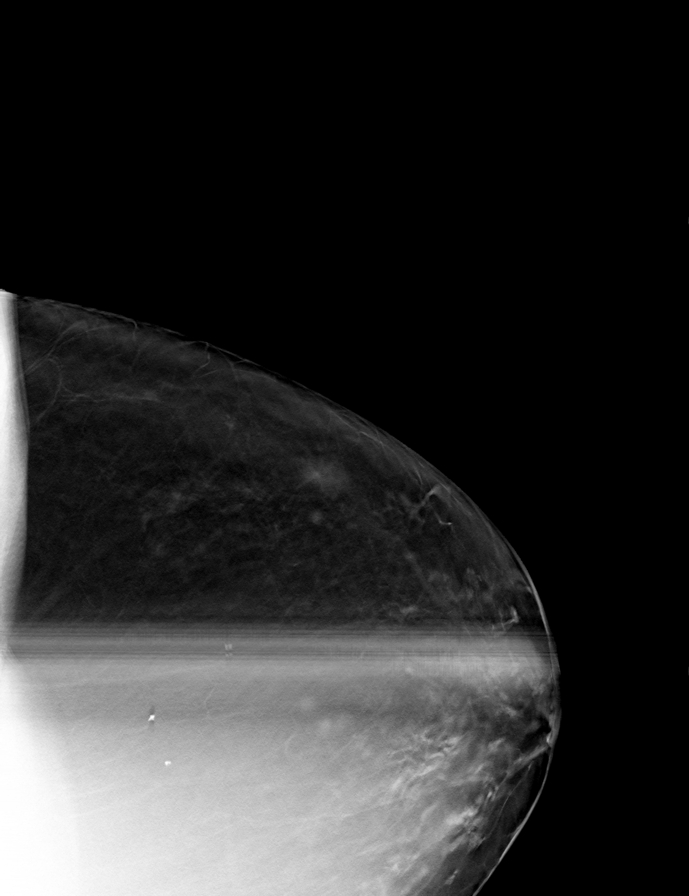

[L ML tomo (2 of 2) · tomo slice 36/71.0]
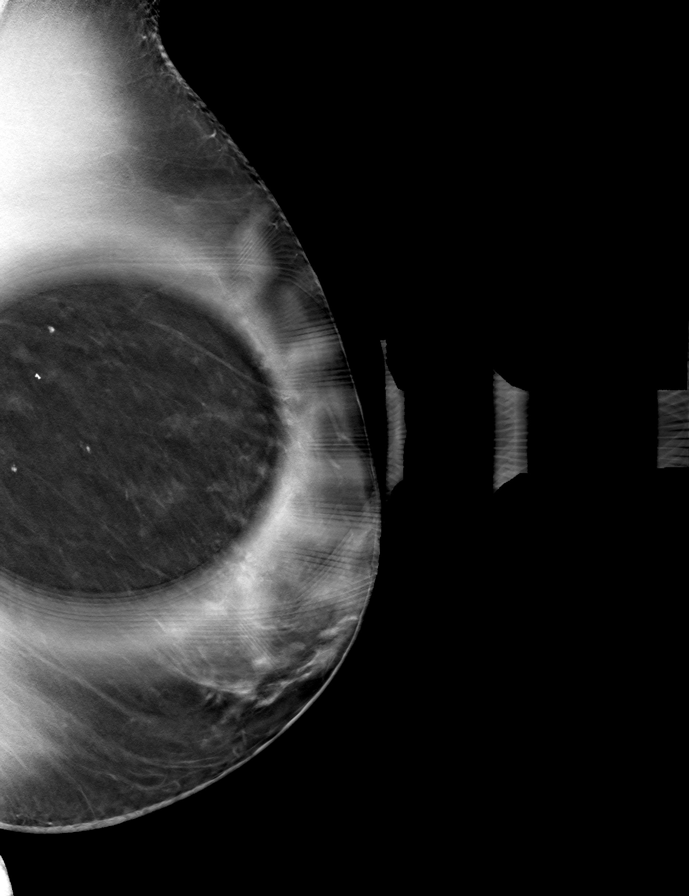

[8 of 24 positions shown; findings below may reference images not displayed]

ACR Breast Density Category b: There are scattered areas of
fibroglandular density.
FINDINGS: The possible asymmetry, noted on the screening CC view, persists on
the diagnostic spot-compression images. The asymmetry appears as an
ill-defined focal opacity spanning 1.3 cm in long axis, with small
internal areas of lower attenuation. This lies laterally, and is
visualized on the diagnostic mL view.

Mammographic images were processed with CAD.

On physical exam, no mass is palpated in the lateral breast.

Targeted ultrasound is performed, showing a hyperechoic lesion with
ill-defined margins and several small internal hypo to anechoic
areas consistent with cysts. This lesion spans 15 x 7 x 13 mm.

Sonographic evaluation of the left axilla shows no enlarged or
abnormal lymph nodes.
IMPRESSION: 1. Indeterminate focal asymmetry in the left breast. Although this
has a benign appearance sonographically, the etiology for this is
unclear. Tissue sampling is recommended.

RECOMMENDATION:
Ultrasound-guided core needle biopsy of the 15 mm hyperechoic lesion
in the lateral left breast. The patient was scheduled for this
procedure prior to leaving the [REDACTED].

I have discussed the findings and recommendations with the patient.
If applicable, a reminder letter will be sent to the patient
regarding the next appointment.

BI-RADS CATEGORY  4: Suspicious.

## 2020-03-11 DIAGNOSIS — R2689 Other abnormalities of gait and mobility: Secondary | ICD-10-CM | POA: Insufficient documentation

## 2020-04-22 ENCOUNTER — Other Ambulatory Visit: Payer: Self-pay | Admitting: Internal Medicine

## 2020-04-22 DIAGNOSIS — Z1231 Encounter for screening mammogram for malignant neoplasm of breast: Secondary | ICD-10-CM

## 2020-05-26 ENCOUNTER — Encounter: Payer: Self-pay | Admitting: Obstetrics and Gynecology

## 2020-05-26 ENCOUNTER — Other Ambulatory Visit: Payer: Self-pay

## 2020-05-26 ENCOUNTER — Ambulatory Visit: Payer: Medicare Other | Admitting: Obstetrics and Gynecology

## 2020-05-26 VITALS — BP 124/78 | Ht 66.0 in | Wt 187.0 lb

## 2020-05-26 DIAGNOSIS — Z79890 Hormone replacement therapy (postmenopausal): Secondary | ICD-10-CM | POA: Diagnosis not present

## 2020-05-26 DIAGNOSIS — Z01419 Encounter for gynecological examination (general) (routine) without abnormal findings: Secondary | ICD-10-CM | POA: Diagnosis not present

## 2020-05-26 DIAGNOSIS — N952 Postmenopausal atrophic vaginitis: Secondary | ICD-10-CM | POA: Diagnosis not present

## 2020-05-26 DIAGNOSIS — L900 Lichen sclerosus et atrophicus: Secondary | ICD-10-CM | POA: Diagnosis not present

## 2020-05-26 MED ORDER — ESTRADIOL 0.5 MG PO TABS: 0.5000 mg | ORAL_TABLET | Freq: Every day | ORAL | 4 refills | Status: DC

## 2020-05-26 MED ORDER — PROGESTERONE MICRONIZED 100 MG PO CAPS: 100.0000 mg | ORAL_CAPSULE | Freq: Every day | ORAL | 4 refills | Status: DC

## 2020-05-26 NOTE — Progress Notes (Signed)
[name] [facility] 07-19-1940 [id number]  SUBJECTIVE:  79 y.o. G0P0 female here for a breast and pelvic exam. She has no gynecologic concerns.  Current Outpatient Medications  Medication Sig Dispense Refill  . AMLODIPINE BESYLATE PO Take by mouth.    Marland Kitchen aspirin EC 81 MG tablet Take 81 mg by mouth at bedtime.    . Cholecalciferol (VITAMIN D3) 125 MCG (5000 UT) CAPS Take 5,000 Units by mouth daily.    . clobetasol ointment (TEMOVATE) 0.05 % Apply topically 2 (two) times daily. As needed for itching 30 g 3  . desonide (DESOWEN) 0.05 % ointment Apply 1 application topically 2 (two) times daily as needed for rash.    . donepezil (ARICEPT) 5 MG tablet Take 5 mg by mouth daily.    . enalapril (VASOTEC) 20 MG tablet Take 20 mg by mouth 2 (two) times daily.    Marland Kitchen estradiol (ESTRACE) 0.5 MG tablet Take 1 tablet (0.5 mg total) by mouth daily. 90 tablet 4  . HYDROcodone-acetaminophen (NORCO/VICODIN) 5-325 MG tablet Take 1 tablet by mouth every 6 (six) hours as needed for moderate pain.    . meloxicam (MOBIC) 15 MG tablet Take 15 mg by mouth daily.    . methocarbamol (ROBAXIN) 500 MG tablet Take 1 tablet (500 mg total) by mouth every 6 (six) hours as needed for muscle spasms. 40 tablet 3  . nystatin ointment (MYCOSTATIN) Apply 1 application topically 2 (two) times daily. 30 g 0  . pravastatin (PRAVACHOL) 20 MG tablet Take 20 mg by mouth daily.    . progesterone (PROMETRIUM) 100 MG capsule Take 1 capsule (100 mg total) by mouth at bedtime. 90 capsule 4  . sertraline (ZOLOFT) 50 MG tablet Take 50 mg by mouth daily.    Marland Kitchen gabapentin (NEURONTIN) 300 MG capsule Take 300 mg by mouth 2 (two) times daily. (Patient not taking: Reported on 05/26/2020)     No current facility-administered medications for this visit.   Allergies: Patient has no known allergies.  Patient's last menstrual period was 06/06/1996.  Past medical history,surgical history, problem list, medications, allergies, family history and  social history were all reviewed and documented as reviewed in the EPIC chart.  GYN ROS: no abnormal bleeding, pelvic pain or discharge, no breast pain or new or enlarging lumps on self exam.  No dysuria, urinary frequency, pain with urination, cloudy/malodorous urine.   OBJECTIVE:  BP 124/78   Ht 5\' 6"  (1.676 m)   Wt 187 lb (84.8 kg)   LMP 06/06/1996   BMI 30.18 kg/m  The patient appears well, alert, oriented, in no distress.  BREAST EXAM: breasts appear normal, no suspicious masses, no skin or nipple changes or axillary nodes  PELVIC EXAM: VULVA: Overall normal appearing vulva with atrophic change, mild symmetrical blanching consistent with known lichen sclerosus, no masses, tenderness or lesions, VAGINA: normal appearing vagina with atrophic change, normal color and discharge, no lesions, CERVIX: normal appearing atrophic cervix without discharge or lesions, UTERUS: uterus is normal size, shape, consistency and nontender, ADNEXA: normal adnexa in size, nontender and no masses  Chaperone: 08/04/1996 present during the examination  ASSESSMENT:  79 y.o. G0P0 here for a breast and pelvic exam  PLAN:   1. Postmenopausal/HRT.  Continues on estradiol 0.5 mg and Prometrium 100 mg nightly.  Denies any vaginal bleeding.  With increasing age and prolonged use of HRT she understands the risks of thrombotic diseases to include heart attack, stroke, DVT, PE and the breast cancer issue.  Weaning  was recommended and discussed last year and again this year.  Will refill her HRT at current doses x1 year and if she is not able to tolerate weaning. 2. Pap smear 2012.  No history of abnormal Pap smears.  Comfortable with not continuing screening based on the current guidelines. 3. Mammogram scheduled for 05/2020.  Normal breast exam today.  She will continue with annual mammograms. 4.  Lichen sclerosis.  Uses clobetasol 0.05% cream as needed, has supply but will notify us if she needs more. 5.  Colonoscopy 2013.  She will follow up at the interval recommended by her GI specialist.   6. DEXA 2019 reported through Dr. Lanell Matar office and she will continue to follow-up with him in regard to bone health management.   7. Health maintenance.  No labs today as she normally has these completed with her primary doctor.  Return annually or sooner, prn.  Theresia Majors MD 05/26/20

## 2020-06-04 ENCOUNTER — Ambulatory Visit: Payer: Medicare Other

## 2020-06-18 ENCOUNTER — Inpatient Hospital Stay: Admission: RE | Admit: 2020-06-18 | Payer: Medicare Other | Source: Ambulatory Visit

## 2020-08-11 DIAGNOSIS — J342 Deviated nasal septum: Secondary | ICD-10-CM | POA: Insufficient documentation

## 2020-08-11 DIAGNOSIS — H6121 Impacted cerumen, right ear: Secondary | ICD-10-CM | POA: Insufficient documentation

## 2020-08-11 DIAGNOSIS — J302 Other seasonal allergic rhinitis: Secondary | ICD-10-CM | POA: Insufficient documentation

## 2020-08-11 DIAGNOSIS — J310 Chronic rhinitis: Secondary | ICD-10-CM | POA: Insufficient documentation

## 2020-09-04 ENCOUNTER — Ambulatory Visit: Payer: Medicare Other

## 2020-09-12 ENCOUNTER — Ambulatory Visit
Admission: RE | Admit: 2020-09-12 | Discharge: 2020-09-12 | Disposition: A | Discharge status: Home / Self Care | Payer: Medicare Other | Source: Ambulatory Visit | Attending: Internal Medicine | Admitting: Internal Medicine

## 2020-09-12 ENCOUNTER — Other Ambulatory Visit: Payer: Self-pay

## 2020-09-12 DIAGNOSIS — Z1231 Encounter for screening mammogram for malignant neoplasm of breast: Secondary | ICD-10-CM

## 2020-10-22 ENCOUNTER — Other Ambulatory Visit: Payer: Self-pay | Admitting: Neurological Surgery

## 2020-10-22 DIAGNOSIS — M47816 Spondylosis without myelopathy or radiculopathy, lumbar region: Secondary | ICD-10-CM

## 2020-11-08 ENCOUNTER — Ambulatory Visit
Admission: RE | Admit: 2020-11-08 | Discharge: 2020-11-08 | Disposition: A | Discharge status: Home / Self Care | Payer: Medicare Other | Source: Ambulatory Visit | Attending: Neurological Surgery | Admitting: Neurological Surgery

## 2020-11-08 DIAGNOSIS — M47816 Spondylosis without myelopathy or radiculopathy, lumbar region: Secondary | ICD-10-CM

## 2020-11-08 MED ORDER — GADOBENATE DIMEGLUMINE 529 MG/ML IV SOLN
15.0000 mL | Freq: Once | INTRAVENOUS | Status: AC | PRN
  Administered 2020-11-08: 15 mL via INTRAVENOUS

## 2020-12-24 ENCOUNTER — Other Ambulatory Visit: Payer: Self-pay | Admitting: Neurological Surgery

## 2020-12-24 DIAGNOSIS — M461 Sacroiliitis, not elsewhere classified: Secondary | ICD-10-CM

## 2021-01-07 ENCOUNTER — Ambulatory Visit
Admission: RE | Admit: 2021-01-07 | Discharge: 2021-01-07 | Disposition: A | Discharge status: Home / Self Care | Payer: Medicare Other | Source: Ambulatory Visit | Attending: Neurological Surgery | Admitting: Neurological Surgery

## 2021-01-07 DIAGNOSIS — M461 Sacroiliitis, not elsewhere classified: Secondary | ICD-10-CM

## 2021-01-20 ENCOUNTER — Other Ambulatory Visit: Payer: Self-pay | Admitting: Neurological Surgery

## 2021-01-29 NOTE — Progress Notes (Addendum)
Surgical Instructions    Your procedure is scheduled on 02/09/21.  Report to [facility] Main Entrance "A" at 5:30 A.M., then check in with the Admitting office.  Call this number if you have problems the morning of surgery:  [phone]   If you have any questions prior to your surgery date call [phone]: Open Monday-Friday 8am-4pm    Remember:  Do not eat after midnight the night before your surgery  You may drink clear liquids until 4:30 the morning of your surgery.   Clear liquids allowed are: Water, Non-Citrus Juices (without pulp), Carbonated Beverages, Clear Tea, Black Coffee ONLY (NO MILK, CREAM OR POWDERED CREAMER of any kind), and Gatorade    Take these medicines the morning of surgery with A SIP OF WATER:  estradiol (ESTRACE) sertraline (ZOLOFT) HYDROcodone-acetaminophen (NORCO/VICODIN) if needed   As of today, STOP taking any Aspirin (unless otherwise instructed by your surgeon) Meloxicam, Aleve, Naproxen, Ibuprofen, Motrin, Advil, Goody's, BC's, all herbal medications, fish oil, and all vitamins.          Do not wear jewelry or makeup Do not wear lotions, powders, perfumes/colognes, or deodorant. Do not shave 48 hours prior to surgery.   Do not bring valuables to the hospital. DO Not wear nail polish, gel polish, artificial nails, or any other type of covering on  natural nails including finger and toenails. If patients have artificial nails, gel coating, etc. that need to be removed by a nail salon please have this removed prior to surgery or surgery may need to be canceled/delayed if the surgeon/ anesthesia feels like the patient is unable to be adequately monitored.             [facility] is not responsible for any belongings or valuables.  Do NOT Smoke (Tobacco/Vaping) or drink Alcohol 24 hours prior to your procedure If you use a CPAP at night, you may bring all equipment for your overnight stay.   Contacts, glasses, dentures or bridgework may not be worn  into surgery, please bring cases for these belongings   For patients admitted to the hospital, discharge time will be determined by your treatment team.   Patients discharged the day of surgery will not be allowed to drive home, and someone needs to stay with them for 24 hours.  ONLY 1 SUPPORT PERSON MAY BE PRESENT WHILE YOU ARE IN SURGERY. IF YOU ARE TO BE ADMITTED ONCE YOU ARE IN YOUR ROOM YOU WILL BE ALLOWED TWO (2) VISITORS.  Minor children may have two parents present. Special consideration for safety and communication needs will be reviewed on a case by case basis.  Special instructions:    Oral Hygiene is also important to reduce your risk of infection.  Remember - BRUSH YOUR TEETH THE MORNING OF SURGERY WITH YOUR REGULAR TOOTHPASTE   [facility]- Preparing For Surgery  Before surgery, you can play an important role. Because skin is not sterile, your skin needs to be as free of germs as possible. You can reduce the number of germs on your skin by washing with CHG (chlorahexidine gluconate) Soap before surgery.  CHG is an antiseptic cleaner which kills germs and bonds with the skin to continue killing germs even after washing.     Please do not use if you have an allergy to CHG or antibacterial soaps. If your skin becomes reddened/irritated stop using the CHG.  Do not shave (including legs and underarms) for at least 48 hours prior to first CHG shower. It is  OK to shave your face.  Please follow these instructions carefully.     Shower the NIGHT BEFORE SURGERY and the MORNING OF SURGERY with CHG Soap.   If you chose to wash your hair, wash your hair first as usual with your normal shampoo. After you shampoo, rinse your hair and body thoroughly to remove the shampoo.  Then [employer] and genitals (private parts) with your normal soap and rinse thoroughly to remove soap.  After that Use CHG Soap as you would any other liquid soap. You can apply CHG directly to the skin and wash gently  with a scrungie or a clean washcloth.   Apply the CHG Soap to your body ONLY FROM THE NECK DOWN.  Do not use on open wounds or open sores. Avoid contact with your eyes, ears, mouth and genitals (private parts). Wash Face and genitals (private parts)  with your normal soap.   Wash thoroughly, paying special attention to the area where your surgery will be performed.  Thoroughly rinse your body with warm water from the neck down.  DO NOT shower/wash with your normal soap after using and rinsing off the CHG Soap.  Pat yourself dry with a CLEAN TOWEL.  Wear CLEAN PAJAMAS to bed the night before surgery  Place CLEAN SHEETS on your bed the night before your surgery  DO NOT SLEEP WITH PETS.   Day of Surgery:  Take a shower with CHG soap. Wear Clean/Comfortable clothing the morning of surgery Do not apply any deodorants/lotions.   Remember to brush your teeth WITH YOUR REGULAR TOOTHPASTE.   Please read over the following fact sheets that you were given.

## 2021-02-01 ENCOUNTER — Encounter (HOSPITAL_COMMUNITY)
Admission: RE | Admit: 2021-02-01 | Discharge: 2021-02-01 | Disposition: A | Discharge status: Home / Self Care | Payer: Medicare Other | Source: Ambulatory Visit | Attending: Neurological Surgery | Admitting: Neurological Surgery

## 2021-02-01 ENCOUNTER — Other Ambulatory Visit: Payer: Self-pay

## 2021-02-01 ENCOUNTER — Encounter (HOSPITAL_COMMUNITY): Payer: Self-pay

## 2021-02-01 DIAGNOSIS — Z01818 Encounter for other preprocedural examination: Secondary | ICD-10-CM | POA: Diagnosis present

## 2021-02-01 LAB — BASIC METABOLIC PANEL
Anion gap: 3 — ABNORMAL LOW (ref 5–15)
BUN: 17 mg/dL (ref 8–23)
CO2: 28 mmol/L (ref 22–32)
Calcium: 9 mg/dL (ref 8.9–10.3)
Chloride: 108 mmol/L (ref 98–111)
Creatinine, Ser: 0.77 mg/dL (ref 0.44–1.00)
GFR, Estimated: >60 mL/min (ref >60)
Glucose, Bld: 96 mg/dL (ref 70–99)
Potassium: 3.9 mmol/L (ref 3.5–5.1)
Sodium: 139 mmol/L (ref 135–145)

## 2021-02-01 LAB — CBC
HCT: 40.7 % (ref 36.0–46.0)
Hemoglobin: 13.3 g/dL (ref 12.0–15.0)
MCH: 31 pg (ref 26.0–34.0)
MCHC: 32.7 g/dL (ref 30.0–36.0)
MCV: 94.9 fL (ref 80.0–100.0)
Platelets: 271 10*3/uL (ref 150–400)
RBC: 4.29 MIL/uL (ref 3.87–5.11)
RDW: 14.5 % (ref 11.5–15.5)
WBC: 6.2 10*3/uL (ref 4.0–10.5)
nRBC: 0 % (ref 0.0–0.2)

## 2021-02-01 NOTE — Progress Notes (Addendum)
PCP: Geoffry Paradise, MD Cardiologist: denies  EKG: 02/01/21 CXR: 02/09/10 ECHO: denies Stress Test: denies Cardiac Cath: denies  Fasting Blood Sugar- na Checks Blood Sugar_na__ times a day  ASA/Blood Thinner: No  OSA/CPAP: No  Covid test 02/05/21.  Gave instructions, map and requisition form to patient.   Anesthesia Review: Yes, abnormal EKG  Patient denies shortness of breath, fever, cough, and chest pain at PAT appointment.  Patient verbalized understanding of instructions provided today at the PAT appointment.  Patient asked to review instructions at home and day of surgery.

## 2021-02-02 NOTE — Progress Notes (Signed)
Anesthesia Chart Review:  Case: [id number] Date/Time: 02/09/21 0715   Procedure: removal of right iliac crest screw with Met-rx (Right)   Anesthesia type: General   Pre-op diagnosis: Sacroiliitis   Location: [facility] OR ROOM 20 / [facility]   Surgeons: Kristeen Miss, MD       DISCUSSION: Patient is a 80 year old female scheduled for the above procedure.  History includes never smoker, HTN, hypercholesterolemia, iron deficiency anemia, anxiety, back surgery (L2-5 PLIF/posterolateral arthrodesis 02/15/10; L5-S1 PLIF with pedicle fixation from previous L2-5 PSL 06/29/18), TKA (right 09/28/09; left 09/19/16; L1-2 anterolateral decompression/lateral plate fixation 1/00/71), right hip bursectomy/gluteal tendon repair 01/14/19  Preoperative COVID-19 test is scheduled for 02/05/2021.  Anesthesia team to evaluate on the day of surgery.   VS: BP (!) 149/74   Pulse 70   Temp 36.8 C (Oral)   Resp 18   Ht '5\' 7"'  (1.702 m)   Wt 87 kg   LMP 06/06/1996   SpO2 100%   BMI 30.02 kg/m   PROVIDERS: Burnard Bunting, MD is PCP -She is not followed routinely by cardiology but had an evaluation by Daneen Schick, MD in 2017 for palpitations and question of carotid bulge. No significant carotid disease by [place] (1-39%, as needed follow-up) and 48 hour Holter monitor was unremarkable.   LABS: Labs reviewed: Acceptable for surgery. (all labs ordered are listed, but only abnormal results are displayed)  Labs Reviewed  BASIC METABOLIC PANEL - Abnormal; Notable for the following components:      Result Value   Anion gap 3 (*)    All other components within normal limits  CBC     IMAGES: CT L-spine 01/07/21: IMPRESSION: 1. Multilevel lumbar fusion without hardware complication. 2. Mild right and moderate left L5 neural foraminal stenosis. - Aortic Atherosclerosis (ICD10-I70.0).   EKG: 02/01/21: Normal sinus rhythm Left axis deviation Pulmonary disease pattern Abnormal ECG No significant change since last  tracing Confirmed by Shelva Majestic [phone]) on 02/01/2021 6:35:51 PM   CV: US Carotid 12/11/15: Impressions: Heterogeneous plaque, bilaterally. 1-39% bilateral ICA stenosis. Normal subclavian arteries, bilaterally. Patent vertebral arteries with antegrade flow. F/U PRN.  48 hour Holter Monitor 12/09/15: Study Highlights: Basic rhythm is NSR Average HR 67 bpm. Range 53-116 bpm Rare PAC and PVC No arrhythmia No significant abnormality.    Past Medical History:  Diagnosis Date   Anxiety    Arthritis    Bartholin's gland abscess 04/2017   Left, incised and drained   High cholesterol    History of iron deficiency anemia    Hypertension     Past Surgical History:  Procedure Laterality Date   ANTERIOR LAT LUMBAR FUSION N/A 09/03/2019   Procedure: Lumbar one-two Anterolateral lumbar interbody fusion with lateral plate;  Surgeon: Kristeen Miss, MD;  Location: [facility];  Service: Neurosurgery;  Laterality: N/A;   BACK SURGERY     BREAST EXCISIONAL BIOPSY Right    x 2   BUNIONECTOMY     CHOLECYSTECTOMY     COLONOSCOPY     DIAGNOSTIC LAPAROSCOPY     EYE SURGERY Bilateral    CATARACT SURGERY   LUMBAR FUSION  2011   OPEN SURGICAL REPAIR OF GLUTEAL TENDON Right 01/14/2019   Procedure: Right hip bursectomy; gluteal tendon repair;  Surgeon: Gaynelle Arabian, MD;  Location: [facility];  Service: Orthopedics;  Laterality: Right;  61mn   right knee replacement   2011   TONSILLECTOMY AND ADENOIDECTOMY     TOTAL KNEE ARTHROPLASTY Left 09/19/2016   Procedure: LEFT TOTAL  KNEE ARTHROPLASTY;  Surgeon: Gaynelle Arabian, MD;  Location: [facility];  Service: Orthopedics;  Laterality: Left;  requests 30mns with abductor block    MEDICATIONS:  amLODipine (NORVASC) 5 MG tablet   Cholecalciferol (VITAMIN D3) 50 MCG (2000 UT) capsule   clobetasol ointment (TEMOVATE) 0.05 %   donepezil (ARICEPT) 5 MG tablet   enalapril (VASOTEC) 20 MG tablet   estradiol (ESTRACE) 0.5 MG tablet   HYDROcodone-acetaminophen  (NORCO/VICODIN) 5-325 MG tablet   meloxicam (MOBIC) 15 MG tablet   methocarbamol (ROBAXIN) 500 MG tablet   nystatin ointment (MYCOSTATIN)   pravastatin (PRAVACHOL) 20 MG tablet   progesterone (PROMETRIUM) 100 MG capsule   progesterone (PROMETRIUM) 100 MG capsule   sertraline (ZOLOFT) 50 MG tablet   No current facility-administered medications for this encounter.    AMyra Gianotti PA-C Surgical Short Stay/Anesthesiology MSumner County HospitalPhone [phone] Sound [facility] PsPhone ([phone]/30/2022 2:32 PM

## 2021-02-02 NOTE — Anesthesia Preprocedure Evaluation (Addendum)
Anesthesia Evaluation  Patient identified by MRN, date of birth, ID band Patient awake    Reviewed: Allergy & Precautions, NPO status , Patient's Chart, lab work & pertinent test results  Airway Mallampati: III  TM Distance: >3 FB Neck ROM: Full    Dental no notable dental hx.    Pulmonary neg pulmonary ROS,    Pulmonary exam normal breath sounds clear to auscultation       Cardiovascular hypertension, Pt. on medications Normal cardiovascular exam Rhythm:Regular Rate:Normal  ECG: rate 71.  Normal sinus rhythm Left axis deviation Pulmonary disease pattern   Neuro/Psych Anxiety negative neurological ROS     GI/Hepatic negative GI ROS, (+)     substance abuse  ,   Endo/Other  negative endocrine ROS  Renal/GU negative Renal ROS     Musculoskeletal  (+) Arthritis , narcotic dependent  Abdominal   Peds  Hematology HLD   Anesthesia Other Findings Sacroiliitis  Reproductive/Obstetrics                           Anesthesia Physical Anesthesia Plan  ASA: 2  Anesthesia Plan: General   Post-op Pain Management:    Induction: Intravenous  PONV Risk Score and Plan: 3 and Ondansetron, Dexamethasone and Treatment may vary due to age or medical condition  Airway Management Planned: Oral ETT  Additional Equipment:   Intra-op Plan:   Post-operative Plan: Extubation in OR  Informed Consent: I have reviewed the patients History and Physical, chart, labs and discussed the procedure including the risks, benefits and alternatives for the proposed anesthesia with the patient or authorized representative who has indicated his/her understanding and acceptance.     Dental advisory given  Plan Discussed with: CRNA  Anesthesia Plan Comments: (Reviewed PAT note written 02/02/2021 by Shonna Chock, PA-C. )       Anesthesia Quick Evaluation

## 2021-02-05 ENCOUNTER — Other Ambulatory Visit: Payer: Self-pay | Admitting: Neurological Surgery

## 2021-02-06 LAB — SARS CORONAVIRUS 2 (TAT 6-24 HRS): SARS Coronavirus 2: NEGATIVE

## 2021-02-09 ENCOUNTER — Inpatient Hospital Stay (HOSPITAL_COMMUNITY): Payer: Medicare Other | Admitting: Vascular Surgery

## 2021-02-09 ENCOUNTER — Other Ambulatory Visit: Payer: Self-pay

## 2021-02-09 ENCOUNTER — Encounter (HOSPITAL_COMMUNITY)
Admission: RE | Disposition: A | Discharge status: Home / Self Care | Payer: Self-pay | Source: Home / Self Care | Attending: Neurological Surgery

## 2021-02-09 ENCOUNTER — Inpatient Hospital Stay (HOSPITAL_COMMUNITY): Payer: Medicare Other

## 2021-02-09 ENCOUNTER — Inpatient Hospital Stay (HOSPITAL_COMMUNITY): Payer: Medicare Other | Admitting: Anesthesiology

## 2021-02-09 ENCOUNTER — Inpatient Hospital Stay (HOSPITAL_COMMUNITY)
Admission: RE | Admit: 2021-02-09 | Discharge: 2021-02-09 | DRG: 497 | Disposition: A | Discharge status: Home / Self Care | Payer: Medicare Other | Attending: Neurological Surgery | Admitting: Neurological Surgery

## 2021-02-09 ENCOUNTER — Encounter (HOSPITAL_COMMUNITY): Payer: Self-pay | Admitting: Neurological Surgery

## 2021-02-09 DIAGNOSIS — T8484XA Pain due to internal orthopedic prosthetic devices, implants and grafts, initial encounter: Principal | ICD-10-CM | POA: Diagnosis present

## 2021-02-09 DIAGNOSIS — Z79899 Other long term (current) drug therapy: Secondary | ICD-10-CM

## 2021-02-09 DIAGNOSIS — Y798 Miscellaneous orthopedic devices associated with adverse incidents, not elsewhere classified: Secondary | ICD-10-CM | POA: Diagnosis present

## 2021-02-09 DIAGNOSIS — Z823 Family history of stroke: Secondary | ICD-10-CM | POA: Diagnosis not present

## 2021-02-09 DIAGNOSIS — Z803 Family history of malignant neoplasm of breast: Secondary | ICD-10-CM

## 2021-02-09 DIAGNOSIS — M1990 Unspecified osteoarthritis, unspecified site: Secondary | ICD-10-CM | POA: Diagnosis present

## 2021-02-09 DIAGNOSIS — Z791 Long term (current) use of non-steroidal anti-inflammatories (NSAID): Secondary | ICD-10-CM

## 2021-02-09 DIAGNOSIS — Z96651 Presence of right artificial knee joint: Secondary | ICD-10-CM | POA: Diagnosis present

## 2021-02-09 DIAGNOSIS — E669 Obesity, unspecified: Secondary | ICD-10-CM | POA: Diagnosis present

## 2021-02-09 DIAGNOSIS — E7800 Pure hypercholesterolemia, unspecified: Secondary | ICD-10-CM | POA: Diagnosis present

## 2021-02-09 DIAGNOSIS — Z6828 Body mass index (BMI) 28.0-28.9, adult: Secondary | ICD-10-CM | POA: Diagnosis not present

## 2021-02-09 DIAGNOSIS — I10 Essential (primary) hypertension: Secondary | ICD-10-CM | POA: Diagnosis present

## 2021-02-09 DIAGNOSIS — Z833 Family history of diabetes mellitus: Secondary | ICD-10-CM | POA: Diagnosis not present

## 2021-02-09 DIAGNOSIS — M4726 Other spondylosis with radiculopathy, lumbar region: Secondary | ICD-10-CM | POA: Diagnosis present

## 2021-02-09 DIAGNOSIS — Z981 Arthrodesis status: Secondary | ICD-10-CM | POA: Diagnosis not present

## 2021-02-09 DIAGNOSIS — Z79890 Hormone replacement therapy (postmenopausal): Secondary | ICD-10-CM

## 2021-02-09 DIAGNOSIS — E785 Hyperlipidemia, unspecified: Secondary | ICD-10-CM | POA: Diagnosis present

## 2021-02-09 DIAGNOSIS — Z419 Encounter for procedure for purposes other than remedying health state, unspecified: Secondary | ICD-10-CM

## 2021-02-09 HISTORY — PX: HARDWARE REMOVAL: SHX979

## 2021-02-09 SURGERY — REMOVAL, HARDWARE
Anesthesia: General | Site: Back | Laterality: Right

## 2021-02-09 MED ORDER — ENALAPRIL MALEATE 20 MG PO TABS
20.0000 mg | ORAL_TABLET | Freq: Two times a day (BID) | ORAL | Status: DC
  Filled 2021-02-09: qty 1

## 2021-02-09 MED ORDER — LIDOCAINE 2% (20 MG/ML) 5 ML SYRINGE
INTRAMUSCULAR | Status: DC | PRN
  Administered 2021-02-09: 60 mg via INTRAVENOUS

## 2021-02-09 MED ORDER — DONEPEZIL HCL 5 MG PO TABS
5.0000 mg | ORAL_TABLET | Freq: Every day | ORAL | Status: DC
  Filled 2021-02-09: qty 1

## 2021-02-09 MED ORDER — HYDROCODONE-ACETAMINOPHEN 5-325 MG PO TABS
1.0000 | ORAL_TABLET | Freq: Four times a day (QID) | ORAL | Status: DC | PRN
Start: 2021-02-09 — End: 2021-02-09

## 2021-02-09 MED ORDER — POLYETHYLENE GLYCOL 3350 17 G PO PACK: 17.0000 g | PACK | Freq: Every day | ORAL | Status: DC | PRN

## 2021-02-09 MED ORDER — PHENYLEPHRINE 40 MCG/ML (10ML) SYRINGE FOR IV PUSH (FOR BLOOD PRESSURE SUPPORT)
PREFILLED_SYRINGE | INTRAVENOUS | Status: DC | PRN
  Administered 2021-02-09: 80 ug via INTRAVENOUS

## 2021-02-09 MED ORDER — PROGESTERONE MICRONIZED 100 MG PO CAPS: 100.0000 mg | ORAL_CAPSULE | Freq: Every day | ORAL | Status: DC

## 2021-02-09 MED ORDER — ONDANSETRON HCL 4 MG/2ML IJ SOLN
INTRAMUSCULAR | Status: DC | PRN
  Administered 2021-02-09: 4 mg via INTRAVENOUS

## 2021-02-09 MED ORDER — PROPOFOL 10 MG/ML IV BOLUS
INTRAVENOUS | Status: AC
  Filled 2021-02-09: qty 20

## 2021-02-09 MED ORDER — ACETAMINOPHEN 650 MG RE SUPP: 650.0000 mg | RECTAL | Status: DC | PRN

## 2021-02-09 MED ORDER — SUGAMMADEX SODIUM 200 MG/2ML IV SOLN
INTRAVENOUS | Status: DC | PRN
Start: 2021-02-09 — End: 2021-02-09
  Administered 2021-02-09: 200 mg via INTRAVENOUS

## 2021-02-09 MED ORDER — CHLORHEXIDINE GLUCONATE 0.12 % MT SOLN: 15.0000 mL | Freq: Once | OROMUCOSAL | Status: AC

## 2021-02-09 MED ORDER — AMLODIPINE BESYLATE 5 MG PO TABS: 5.0000 mg | ORAL_TABLET | Freq: Every day | ORAL | Status: DC

## 2021-02-09 MED ORDER — ONDANSETRON HCL 4 MG/2ML IJ SOLN: 4.0000 mg | Freq: Four times a day (QID) | INTRAMUSCULAR | Status: DC | PRN

## 2021-02-09 MED ORDER — LIDOCAINE-EPINEPHRINE 2 %-1:100000 IJ SOLN
INTRAMUSCULAR | Status: DC | PRN
  Administered 2021-02-09: 4 mL via INTRADERMAL

## 2021-02-09 MED ORDER — ACETAMINOPHEN 325 MG PO TABS: 650.0000 mg | ORAL_TABLET | ORAL | Status: DC | PRN

## 2021-02-09 MED ORDER — PROPOFOL 10 MG/ML IV BOLUS
INTRAVENOUS | Status: DC | PRN
Start: 2021-02-09 — End: 2021-02-09
  Administered 2021-02-09: 150 mg via INTRAVENOUS

## 2021-02-09 MED ORDER — THROMBIN 5000 UNITS EX SOLR: OROMUCOSAL | Status: DC | PRN

## 2021-02-09 MED ORDER — METHOCARBAMOL 500 MG PO TABS: 500.0000 mg | ORAL_TABLET | Freq: Four times a day (QID) | ORAL | Status: DC | PRN

## 2021-02-09 MED ORDER — DOCUSATE SODIUM 100 MG PO CAPS: 100.0000 mg | ORAL_CAPSULE | Freq: Two times a day (BID) | ORAL | Status: DC

## 2021-02-09 MED ORDER — HYDROMORPHONE HCL 1 MG/ML IJ SOLN
INTRAMUSCULAR | Status: DC | PRN
  Administered 2021-02-09: .5 mg via INTRAVENOUS

## 2021-02-09 MED ORDER — ORAL CARE MOUTH RINSE: 15.0000 mL | Freq: Once | OROMUCOSAL | Status: AC

## 2021-02-09 MED ORDER — BISACODYL 10 MG RE SUPP: 10.0000 mg | Freq: Every day | RECTAL | Status: DC | PRN

## 2021-02-09 MED ORDER — CEFAZOLIN SODIUM-DEXTROSE 2-4 GM/100ML-% IV SOLN
2.0000 g | INTRAVENOUS | Status: AC
  Administered 2021-02-09: 2 g via INTRAVENOUS
  Filled 2021-02-09: qty 100

## 2021-02-09 MED ORDER — VITAMIN D 25 MCG (1000 UNIT) PO TABS: 2000.0000 [IU] | ORAL_TABLET | Freq: Every day | ORAL | Status: DC

## 2021-02-09 MED ORDER — SODIUM CHLORIDE 0.9% FLUSH
3.0000 mL | Freq: Two times a day (BID) | INTRAVENOUS | Status: DC
  Administered 2021-02-09: 3 mL via INTRAVENOUS

## 2021-02-09 MED ORDER — CHLORHEXIDINE GLUCONATE CLOTH 2 % EX PADS: 6.0000 | MEDICATED_PAD | Freq: Once | CUTANEOUS | Status: DC

## 2021-02-09 MED ORDER — DEXAMETHASONE SODIUM PHOSPHATE 10 MG/ML IJ SOLN
INTRAMUSCULAR | Status: AC
  Filled 2021-02-09: qty 1

## 2021-02-09 MED ORDER — ALUM & MAG HYDROXIDE-SIMETH 200-200-20 MG/5ML PO SUSP: 30.0000 mL | Freq: Four times a day (QID) | ORAL | Status: DC | PRN

## 2021-02-09 MED ORDER — ROCURONIUM BROMIDE 10 MG/ML (PF) SYRINGE
PREFILLED_SYRINGE | INTRAVENOUS | Status: DC | PRN
  Administered 2021-02-09: 40 mg via INTRAVENOUS
  Administered 2021-02-09: 20 mg via INTRAVENOUS

## 2021-02-09 MED ORDER — ONDANSETRON HCL 4 MG PO TABS: 4.0000 mg | ORAL_TABLET | Freq: Four times a day (QID) | ORAL | Status: DC | PRN

## 2021-02-09 MED ORDER — AMISULPRIDE (ANTIEMETIC) 5 MG/2ML IV SOLN: 10.0000 mg | Freq: Once | INTRAVENOUS | Status: DC | PRN

## 2021-02-09 MED ORDER — BUPIVACAINE HCL (PF) 0.5 % IJ SOLN
INTRAMUSCULAR | Status: AC
  Filled 2021-02-09: qty 30

## 2021-02-09 MED ORDER — THROMBIN 5000 UNITS EX SOLR: CUTANEOUS | Status: DC | PRN

## 2021-02-09 MED ORDER — MENTHOL 3 MG MT LOZG: 1.0000 | LOZENGE | OROMUCOSAL | Status: DC | PRN

## 2021-02-09 MED ORDER — FENTANYL CITRATE (PF) 100 MCG/2ML IJ SOLN: 25.0000 ug | INTRAMUSCULAR | Status: DC | PRN

## 2021-02-09 MED ORDER — ACETAMINOPHEN 10 MG/ML IV SOLN: 1000.0000 mg | Freq: Once | INTRAVENOUS | Status: DC | PRN

## 2021-02-09 MED ORDER — ESTRADIOL 1 MG PO TABS: 0.5000 mg | ORAL_TABLET | Freq: Every day | ORAL | Status: DC

## 2021-02-09 MED ORDER — FENTANYL CITRATE (PF) 250 MCG/5ML IJ SOLN
INTRAMUSCULAR | Status: AC
  Filled 2021-02-09: qty 5

## 2021-02-09 MED ORDER — DEXAMETHASONE SODIUM PHOSPHATE 10 MG/ML IJ SOLN
INTRAMUSCULAR | Status: DC | PRN
  Administered 2021-02-09: 10 mg via INTRAVENOUS

## 2021-02-09 MED ORDER — FENTANYL CITRATE (PF) 250 MCG/5ML IJ SOLN
INTRAMUSCULAR | Status: DC | PRN
  Administered 2021-02-09 (×2): 100 ug via INTRAVENOUS
  Administered 2021-02-09: 50 ug via INTRAVENOUS

## 2021-02-09 MED ORDER — CHLORHEXIDINE GLUCONATE 0.12 % MT SOLN
OROMUCOSAL | Status: AC
  Administered 2021-02-09: 15 mL via OROMUCOSAL
  Filled 2021-02-09: qty 15

## 2021-02-09 MED ORDER — MORPHINE SULFATE (PF) 2 MG/ML IV SOLN: 2.0000 mg | INTRAVENOUS | Status: DC | PRN

## 2021-02-09 MED ORDER — SODIUM CHLORIDE 0.9 % IV SOLN
250.0000 mL | INTRAVENOUS | Status: DC
  Administered 2021-02-09: 250 mL via INTRAVENOUS

## 2021-02-09 MED ORDER — THROMBIN 5000 UNITS EX SOLR
CUTANEOUS | Status: AC
  Filled 2021-02-09: qty 5000

## 2021-02-09 MED ORDER — SENNA 8.6 MG PO TABS: 1.0000 | ORAL_TABLET | Freq: Two times a day (BID) | ORAL | Status: DC

## 2021-02-09 MED ORDER — SODIUM CHLORIDE 0.9% FLUSH: 3.0000 mL | INTRAVENOUS | Status: DC | PRN

## 2021-02-09 MED ORDER — HYDROCODONE-ACETAMINOPHEN 5-325 MG PO TABS: 1.0000 | ORAL_TABLET | Freq: Four times a day (QID) | ORAL | 0 refills | Status: DC | PRN

## 2021-02-09 MED ORDER — LACTATED RINGERS IV SOLN: INTRAVENOUS | Status: DC

## 2021-02-09 MED ORDER — HYDROMORPHONE HCL 1 MG/ML IJ SOLN
INTRAMUSCULAR | Status: AC
  Filled 2021-02-09: qty 0.5

## 2021-02-09 MED ORDER — CEFAZOLIN SODIUM-DEXTROSE 2-4 GM/100ML-% IV SOLN
2.0000 g | Freq: Three times a day (TID) | INTRAVENOUS | Status: DC
Start: 2021-02-09 — End: 2021-02-09

## 2021-02-09 MED ORDER — LIDOCAINE-EPINEPHRINE 2 %-1:100000 IJ SOLN
INTRAMUSCULAR | Status: AC
  Filled 2021-02-09: qty 1

## 2021-02-09 MED ORDER — BUPIVACAINE HCL (PF) 0.5 % IJ SOLN
INTRAMUSCULAR | Status: DC | PRN
  Administered 2021-02-09: 30 mL

## 2021-02-09 MED ORDER — FLEET ENEMA 7-19 GM/118ML RE ENEM: 1.0000 | ENEMA | Freq: Once | RECTAL | Status: DC | PRN

## 2021-02-09 MED ORDER — PHENOL 1.4 % MT LIQD: 1.0000 | OROMUCOSAL | Status: DC | PRN

## 2021-02-09 MED ORDER — SERTRALINE HCL 50 MG PO TABS: 50.0000 mg | ORAL_TABLET | Freq: Every morning | ORAL | Status: DC

## 2021-02-09 MED ORDER — PRAVASTATIN SODIUM 10 MG PO TABS: 20.0000 mg | ORAL_TABLET | Freq: Every day | ORAL | Status: DC

## 2021-02-09 MED ORDER — 0.9 % SODIUM CHLORIDE (POUR BTL) OPTIME
TOPICAL | Status: DC | PRN
  Administered 2021-02-09: 1000 mL

## 2021-02-09 MED ORDER — ONDANSETRON HCL 4 MG/2ML IJ SOLN: 4.0000 mg | Freq: Once | INTRAMUSCULAR | Status: DC | PRN

## 2021-02-09 SURGICAL SUPPLY — 50 items
APL SKNCLS STERI-STRIP NONHPOA (GAUZE/BANDAGES/DRESSINGS)
BAG COUNTER SPONGE SURGICOUNT (BAG) ×2 IMPLANT
BAG SPNG CNTER NS LX DISP (BAG) ×1
BENZOIN TINCTURE PRP APPL 2/3 (GAUZE/BANDAGES/DRESSINGS) IMPLANT
BLADE CLIPPER SURG (BLADE) IMPLANT
BUR CROSS CUT FISSURE 1.2 (BURR) ×4 IMPLANT
CANISTER SUCT 3000ML PPV (MISCELLANEOUS) ×2 IMPLANT
CARTRIDGE OIL MAESTRO DRILL (MISCELLANEOUS) IMPLANT
DECANTER SPIKE VIAL GLASS SM (MISCELLANEOUS) ×2 IMPLANT
DIFFUSER DRILL AIR PNEUMATIC (MISCELLANEOUS) IMPLANT
DRAPE C-ARM 42X72 X-RAY (DRAPES) IMPLANT
DRAPE LAPAROTOMY 100X72 PEDS (DRAPES) IMPLANT
DRAPE LAPAROTOMY 100X72X124 (DRAPES) IMPLANT
DURAPREP 26ML APPLICATOR (WOUND CARE) ×2 IMPLANT
ELECT BLADE 4.0 EZ CLEAN MEGAD (MISCELLANEOUS) ×2
ELECT REM PT RETURN 9FT ADLT (ELECTROSURGICAL) ×2
ELECTRODE BLDE 4.0 EZ CLN MEGD (MISCELLANEOUS) ×1 IMPLANT
ELECTRODE REM PT RTRN 9FT ADLT (ELECTROSURGICAL) ×1 IMPLANT
GAUZE 4X4 16PLY ~~LOC~~+RFID DBL (SPONGE) IMPLANT
GAUZE SPONGE 4X4 12PLY STRL (GAUZE/BANDAGES/DRESSINGS) ×2 IMPLANT
GLOVE SURG LTX SZ8.5 (GLOVE) ×2 IMPLANT
GLOVE SURG UNDER POLY LF SZ8.5 (GLOVE) ×2 IMPLANT
GOWN STRL REUS W/ TWL LRG LVL3 (GOWN DISPOSABLE) ×1 IMPLANT
GOWN STRL REUS W/ TWL XL LVL3 (GOWN DISPOSABLE) ×1 IMPLANT
GOWN STRL REUS W/TWL 2XL LVL3 (GOWN DISPOSABLE) ×2 IMPLANT
GOWN STRL REUS W/TWL LRG LVL3 (GOWN DISPOSABLE) ×2
GOWN STRL REUS W/TWL XL LVL3 (GOWN DISPOSABLE) ×2
KIT BASIN OR (CUSTOM PROCEDURE TRAY) ×2 IMPLANT
KIT TURNOVER KIT B (KITS) ×2 IMPLANT
MARKER SKIN DUAL TIP RULER LAB (MISCELLANEOUS) ×2 IMPLANT
NEEDLE HYPO 22GX1.5 SAFETY (NEEDLE) ×2 IMPLANT
NS IRRIG 1000ML POUR BTL (IV SOLUTION) ×2 IMPLANT
OIL CARTRIDGE MAESTRO DRILL (MISCELLANEOUS)
PACK LAMINECTOMY NEURO (CUSTOM PROCEDURE TRAY) ×2 IMPLANT
PAD ARMBOARD 7.5X6 YLW CONV (MISCELLANEOUS) ×2 IMPLANT
RASP 3.0MM (RASP) ×2 IMPLANT
SPONGE SURGIFOAM ABS GEL SZ50 (HEMOSTASIS) ×2 IMPLANT
SPONGE T-LAP 4X18 ~~LOC~~+RFID (SPONGE) IMPLANT
STAPLER SKIN PROX WIDE 3.9 (STAPLE) IMPLANT
STRIP CLOSURE SKIN 1/2X4 (GAUZE/BANDAGES/DRESSINGS) IMPLANT
SUT VIC AB 0 CT1 18XCR BRD8 (SUTURE) ×1 IMPLANT
SUT VIC AB 0 CT1 8-18 (SUTURE) ×2
SUT VIC AB 2-0 CP2 18 (SUTURE) ×2 IMPLANT
SUT VIC AB 3-0 SH 8-18 (SUTURE) ×2 IMPLANT
SUT VIC AB 4-0 RB1 18 (SUTURE) ×2 IMPLANT
SWAB COLLECTION DEVICE MRSA (MISCELLANEOUS) IMPLANT
SWAB CULTURE ESWAB REG 1ML (MISCELLANEOUS) IMPLANT
TOWEL GREEN STERILE (TOWEL DISPOSABLE) ×2 IMPLANT
TOWEL GREEN STERILE FF (TOWEL DISPOSABLE) ×2 IMPLANT
WATER STERILE IRR 1000ML POUR (IV SOLUTION) ×2 IMPLANT

## 2021-02-09 NOTE — Transfer of Care (Signed)
Immediate Anesthesia Transfer of Care Note  Patient: [name]  Procedure(s) Performed: removal of right iliac crest screw with Met-rx (Right: Back)  Patient Location: PACU  Anesthesia Type:General  Level of Consciousness: awake, alert  and oriented  Airway & Oxygen Therapy: Patient Spontanous Breathing and Patient connected to nasal cannula oxygen  Post-op Assessment: Report given to RN, Post -op Vital signs reviewed and stable and Patient moving all extremities X 4  Post vital signs: Reviewed and stable  Last Vitals:  Vitals Value Taken Time  BP 135/98 02/09/21 0941  Temp    Pulse 90 02/09/21 0941  Resp 23 02/09/21 0941  SpO2 94 % 02/09/21 0941  Vitals shown include unvalidated device data.  Last Pain:  Vitals:   02/09/21 0612  TempSrc: Oral  PainSc:       Patients Stated Pain Goal: 0 (25/63/89 3734)  Complications: No notable events documented.

## 2021-02-09 NOTE — Anesthesia Procedure Notes (Signed)
Procedure Name: Intubation Date/Time: 02/09/2021 8:03 AM Performed by: Marena Chancy, CRNA Pre-anesthesia Checklist: Patient identified, Emergency Drugs available, Suction available and Patient being monitored Patient Re-evaluated:Patient Re-evaluated prior to induction Oxygen Delivery Method: Circle System Utilized Preoxygenation: Pre-oxygenation with 100% oxygen Induction Type: IV induction Ventilation: Mask ventilation without difficulty Laryngoscope Size: Miller and 2 Grade View: Grade I Tube type: Oral Tube size: 7.0 mm Number of attempts: 1 Airway Equipment and Method: Stylet and Oral airway Placement Confirmation: ETT inserted through vocal cords under direct vision, positive ETCO2 and breath sounds checked- equal and bilateral Tube secured with: Tape Dental Injury: Teeth and Oropharynx as per pre-operative assessment

## 2021-02-09 NOTE — H&P (Addendum)
[name] is an 80 y.o. female.   Chief Complaint: Right posterior gluteal pain and posterior suprailiac crest area HPI: [name] is a 80 year old individual whose had extensive surgery on the back to decompress and stabilize from [facility] to the pelvis.  Since her pelvic fixation has been in place she has developed pain over the posterior suprailiac crest region this directly corresponds to an area over the iliac screw.  Her arthrodesis has healed solidly and there is no evidence of motion but on 2 occasions when we have injected around the iliac screw the patient gets transient relief of the pain.  At this point having assessed her arthrodesis to be stable I have advised removing the iliac screw on the right side see if this will help affect relief on a more permanent basis for the pain in that region.  Past Medical History:  Diagnosis Date   Anxiety    Arthritis    Bartholin's gland abscess 04/2017   Left, incised and drained   High cholesterol    History of iron deficiency anemia    Hypertension     Past Surgical History:  Procedure Laterality Date   ANTERIOR LAT LUMBAR FUSION N/A 09/03/2019   Procedure: Lumbar one-two Anterolateral lumbar interbody fusion with lateral plate;  Surgeon: Barnett Abu, MD;  Location: [facility] OR;  Service: Neurosurgery;  Laterality: N/A;   BACK SURGERY     BREAST EXCISIONAL BIOPSY Right    x 2   BUNIONECTOMY     CHOLECYSTECTOMY     COLONOSCOPY     DIAGNOSTIC LAPAROSCOPY     EYE SURGERY Bilateral    CATARACT SURGERY   LUMBAR FUSION  2011   OPEN SURGICAL REPAIR OF GLUTEAL TENDON Right 01/14/2019   Procedure: Right hip bursectomy; gluteal tendon repair;  Surgeon: Ollen Gross, MD;  Location: [facility];  Service: Orthopedics;  Laterality: Right;    right knee replacement   2011   TONSILLECTOMY AND ADENOIDECTOMY     TOTAL KNEE ARTHROPLASTY Left 09/19/2016   Procedure: LEFT TOTAL KNEE ARTHROPLASTY;  Surgeon: Ollen Gross, MD;  Location: [facility];  Service: Orthopedics;  Laterality: Left;  requests with abductor block    Family History  Problem Relation Age of Onset   Stroke Mother    Cancer Father        colon   Diabetes Father    Breast cancer Maternal Aunt    Breast cancer Maternal Grandmother    Social History:  reports that she has never smoked. She has never used smokeless tobacco. She reports that she does not drink alcohol and does not use drugs.  Allergies: No Known Allergies  Medications Prior to Admission  Medication Sig Dispense Refill   amLODipine (NORVASC) 5 MG tablet Take 5 mg by mouth at bedtime.     Cholecalciferol (VITAMIN D3) 50 MCG (2000 UT) capsule Take 2,000 Units by mouth daily.     donepezil (ARICEPT) 5 MG tablet Take 5 mg by mouth at bedtime.     enalapril (VASOTEC) 20 MG tablet Take 20 mg by mouth 2 (two) times daily.     estradiol (ESTRACE) 0.5 MG tablet Take 1 tablet (0.5 mg total) by mouth daily. 90 tablet 4   HYDROcodone-acetaminophen (NORCO/VICODIN) 5-325 MG tablet Take 1 tablet by mouth every 6 (six) hours as needed for moderate pain.     meloxicam (MOBIC) 15 MG tablet Take 15 mg by mouth daily.     pravastatin (PRAVACHOL) 20 MG tablet  Take 20 mg by mouth at bedtime.     progesterone (PROMETRIUM) 100 MG capsule Take 1 capsule (100 mg total) by mouth daily. (Patient taking differently: Take 100 mg by mouth at bedtime.) 90 capsule 4   sertraline (ZOLOFT) 50 MG tablet Take 50 mg by mouth in the morning.     clobetasol ointment (TEMOVATE) 0.05 % Apply topically 2 (two) times daily. As needed for itching (Patient not taking: Reported on 01/26/2021) 30 g 3   methocarbamol (ROBAXIN) 500 MG tablet Take 1 tablet (500 mg total) by mouth every 6 (six) hours as needed for muscle spasms. (Patient not taking: Reported on 01/26/2021) 40 tablet 3   nystatin ointment (MYCOSTATIN) Apply 1 application topically 2 (two) times daily. (Patient not taking: Reported on 01/26/2021) 30 g 0   progesterone  (PROMETRIUM) 100 MG capsule Take 1 capsule (100 mg total) by mouth at bedtime. (Patient not taking: Reported on 01/26/2021) 90 capsule 4    No results found for this or any previous visit (from the past 48 hour(s)). No results found.  Review of Systems  Constitutional:  Positive for activity change.  HENT: Negative.    Eyes: Negative.   Respiratory: Negative.    Cardiovascular: Negative.   Gastrointestinal: Negative.   Endocrine: Negative.   Genitourinary: Negative.   Musculoskeletal:  Positive for back pain.  Allergic/Immunologic: Negative.   Neurological:  Positive for weakness and numbness.  Hematological: Negative.   Psychiatric/Behavioral: Negative.     Blood pressure (!) 153/69, pulse 69, temperature 97.9 F (36.6 C), temperature source Oral, height 5\' 7"  (1.702 m), weight 81.2 kg, last menstrual period 06/06/1996, SpO2 95 %. Physical Exam Constitutional:      Appearance: Normal appearance. She is obese.  HENT:     Head: Normocephalic and atraumatic.     Nose: Nose normal.     Mouth/Throat:     Mouth: Mucous membranes are moist.  Eyes:     Extraocular Movements: Extraocular movements intact.     Conjunctiva/sclera: Conjunctivae normal.     Pupils: Pupils are equal, round, and reactive to light.  Cardiovascular:     Rate and Rhythm: Normal rate and regular rhythm.     Pulses: Normal pulses.     Heart sounds: Normal heart sounds.  Pulmonary:     Effort: Pulmonary effort is normal.     Breath sounds: Normal breath sounds.  Abdominal:     General: Abdomen is flat. Bowel sounds are normal.     Palpations: Abdomen is soft.  Musculoskeletal:     Cervical back: Normal range of motion.     Comments: Positive straight leg raising at 30 degrees on the right side for primarily posterior pelvic pain  Skin:    General: Skin is warm and dry.     Capillary Refill: Capillary refill takes less than 2 seconds.  Neurological:     Mental Status: She is alert.     Comments: Motor  strength is intact in iliopsoas quadriceps tibialis anterior and gastrocs at 4 out of 5 absent reflexes in the patellae and the Achilles.  Sensation is intact distally in the lower extremities upper extremity strength and reflexes intact cranial nerve examination is normal.  Psychiatric:        Mood and Affect: Mood normal.        Behavior: Behavior normal.     Assessment/Plan Pain in the region of right posterior iliac screw on the right side.  Plan: Removal of hardware in the ilium on  the right side.  Stefani Dama, MD 02/09/2021, 7:40 AM

## 2021-02-09 NOTE — Anesthesia Postprocedure Evaluation (Signed)
Anesthesia Post Note  Patient: [name]  Procedure(s) Performed: removal of right iliac crest screw with Met-rx (Right: Back)     Patient location during evaluation: PACU Anesthesia Type: General Level of consciousness: awake Pain management: pain level controlled Vital Signs Assessment: post-procedure vital signs reviewed and stable Respiratory status: spontaneous breathing, nonlabored ventilation, respiratory function stable and patient connected to nasal cannula oxygen Cardiovascular status: blood pressure returned to baseline and stable Postop Assessment: no apparent nausea or vomiting Anesthetic complications: no   No notable events documented.  Last Vitals:  Vitals:   02/09/21 1027 02/09/21 1106  BP: 131/64 (!) 154/68  Pulse: 70 75  Resp: 12 20  Temp: 36.8 C 36.8 C  SpO2: 99% 99%    Last Pain:  Vitals:   02/09/21 1106  TempSrc: Oral  PainSc:                  Maniyah Moller P Alassane Kalafut

## 2021-02-09 NOTE — Progress Notes (Signed)
Physical Therapy Evaluation & Discharge Patient Details Name: Bellagrace Sylvan MRN: [id number] DOB: [birth year] Today's Date: 02/09/2021   History of Present Illness  Pt is a 65 female presenting s/p R iliac screw removal on 9/6. PMH: anxiety, HTN, anemia, s/p fusion L1-L2.  Clinical Impression  Pt presents s/p the procedure above. Pt seated EOB upon entry. Min guard to supervision for transfers and ambulation in the hallway with cane. Reviewed back precautions and pt verbalized understanding. Educated pt on walking program during recovery. Pt would benefit from outpatient physical therapy post-recovery to address limitations, once cleared by MD. Pt has no further acute therapy needs; we will sign off. Thank you for this consult. If needs change please re-consult.    Follow Up Recommendations Other (comment) (pt would benefit from outpatient physical therapy to address current limitations once cleared by MD)    Equipment Recommendations  None recommended by PT (Pt has required DME.)    Recommendations for Other Services       Precautions / Restrictions Precautions Precautions: Back Precaution Booklet Issued: Yes (comment) Precaution Comments: Reviewed back precuations. Restrictions Weight Bearing Restrictions: No (.)      Mobility  Bed Mobility               General bed mobility comments: Pt seated EOB upon entry and exit.    Transfers Overall transfer level: Needs assistance Equipment used: Straight cane Transfers: Sit to/from Stand Sit to Stand: Supervision         General transfer comment: Supervision for safety only.  Ambulation/Gait Ambulation/Gait assistance: Min guard;Supervision Gait Distance (Feet): 150 Feet Assistive device: Straight cane Gait Pattern/deviations: Step-through pattern;Decreased stance time - right;Decreased weight shift to right Gait velocity: Decreased   General Gait Details: Pt ambulated with min guard to supervision for safety  only with SPC, no overt LOB noted. Some unsteadiness noted, decreased weightshift to R, decreased stance time on R, and gaurded-style gait consistent with s/p surgery. Educated about using RW if needed at d/c  [employer]    Modified Rankin (Stroke Patients Only)       Balance Overall balance assessment: Needs assistance Sitting-balance support: Feet supported;No upper extremity supported Sitting balance-Leahy Scale: Good     Standing balance support: Single extremity supported;During functional activity Standing balance-Leahy Scale: Fair                               Pertinent Vitals/Pain Pain Assessment: 0-10 Pain Score: 2  Pain Location: R Hip Pain Descriptors / Indicators: Operative site guarding;Guarding Pain Intervention(s): Monitored during session;Repositioned    Home Living Family/patient expects to be discharged to:: Private residence Living Arrangements: Alone Available Help at Discharge: Neighbor;Available PRN/intermittently (Star Young lives nearby, will pick up pt.) Type of Home: House Home Access: Level entry     Home Layout: One level Home Equipment: Cane - single point;Walker - 2 wheels;Grab bars - tub/shower;Bedside commode;Shower seat - built in Additional Comments: Pt reports she enjoys movies.    Prior Function Level of Independence: Independent with assistive device(s)         Comments: Pt uses cane secondary to R hip pain.     Hand Dominance   Dominant Hand: Right    Extremity/Trunk Assessment   Upper Extremity Assessment Upper Extremity Assessment: Defer to OT evaluation    Lower Extremity Assessment Lower Extremity Assessment: Overall WFL for  tasks assessed    Cervical / Trunk Assessment Cervical / Trunk Assessment: Other exceptions Cervical / Trunk Exceptions: S/p R iliac screw removal. Previous L1-L2 fusion.  Communication   Communication: No difficulties  Cognition  Arousal/Alertness: Awake/alert Behavior During Therapy: WFL for tasks assessed/performed Overall Cognitive Status: Within Functional Limits for tasks assessed                                        General Comments      Exercises Other Exercises Other Exercises: Educated pt on walking program and holding other exercise routines until cleared by Careers adviser.   Assessment/Plan    PT Assessment Patent does not need any further PT services  PT Problem List         PT Treatment Interventions      PT Goals (Current goals can be found in the Care Plan section)  Acute Rehab PT Goals Patient Stated Goal: To go to the movies PT Goal Formulation: With patient Time For Goal Achievement: 02/09/21 Potential to Achieve Goals: Good    Frequency     Barriers to discharge        Co-evaluation               AM-PAC PT "6 Clicks" Mobility  Outcome Measure Help needed turning from your back to your side while in a flat bed without using bedrails?: None Help needed moving from lying on your back to sitting on the side of a flat bed without using bedrails?: None Help needed moving to and from a bed to a chair (including a wheelchair)?: A Little Help needed standing up from a chair using your arms (e.g., wheelchair or bedside chair)?: A Little Help needed to walk in hospital room?: A Little Help needed climbing 3-5 steps with a railing? : A Little 6 Click Score: 20    End of Session Equipment Utilized During Treatment: Gait belt Activity Tolerance: Patient tolerated treatment well Patient left: in bed;with call bell/phone within reach Nurse Communication: Mobility status PT Visit Diagnosis: Pain;Muscle weakness (generalized) (M62.81);Other abnormalities of gait and mobility (R26.89) Pain - Right/Left: Right Pain - part of body: Hip    Time: 4709-6283 PT Time Calculation (min) (ACUTE ONLY): 22 min   Charges:   PT Evaluation $PT Eval Low Complexity: 1 Low           Johnn Hai, SPT Johnn Hai 02/09/2021, 3:01 PM

## 2021-02-09 NOTE — Op Note (Signed)
Date of surgery: 02/09/2021 Preoperative diagnosis: Right posterior superior iliac crest pain related to surgical hardware Postoperative diagnosis: Same Procedure: Removal of right iliac screw fixation Surgeon: Barnett Abu Anesthesia: General endotracheal Indications: [name] is a 80 year old individual who underwent decompression fusion of a L5-S1 joint down to her sacrum.  Post operatively she developed a right posterior superior iliac crest pain and after a couple of trials of injection with Marcaine she had transient relief of the pain she has developed a solid arthrodesis and there is no loosening of the hardware and was discussed about removing the hardware from the iliac wing where the pain seems to be focused about she is now admitted for that procedure.  Procedure: Patient was brought to the operating room supine on the stretcher.  After the smooth induction of general endotracheal anesthesia she was carefully turned prone.  The back was prepped with alcohol DuraPrep and fluoroscopic guidance was used to mark the site over the iliac screw and the more medially placed rod.  A transverse incision was made over the 2 of these after in filtrating with lidocaine and Marcaine.  A K wire was passed to what was palpated as the screw and then a series of dilators was passed over this to dilate to a 22 mm size.  Through this 4 cm deep aperture then we cleared the fascia overlying the screw.  It is noted that the screw that was used was a closed tulip head and this would require cutting the rod just superior to the transverse connector for the iliac screw.  This was done with a high-speed cutting bit.  Then the transverse piece was removed and the iliac screw was removed.  The area was checked for hemostasis and care was taken to make sure all the metal filings from the cut rod were removed also.  Once this was accomplished 20 cc of half percent Marcaine was injected into the surgical area and the fascia  was closed with 2-0 Vicryl interrupted fashion 3-0 Vicryl in the subcutaneous layer and 4-0 Vicryl subcuticularly.  Dermabond was placed on the skin.  Blood loss for the procedure was less than 10 cc

## 2021-02-09 NOTE — Discharge Summary (Signed)
Physician Discharge Summary  Patient ID: [name] MRN: [id number] DOB/AGE: [birth year] 79 y.o.  Admit date: 02/09/2021 Discharge date: 02/09/2021  Admission Diagnoses: Right superior posterior iliac crest pain secondary to hardware  Discharge Diagnoses: Right superior posterior iliac crest pain secondary to hardware Active Problems:   Other spondylosis with radiculopathy, lumbar region   Discharged Condition: good  Hospital Course: Patient was admitted to undergo surgical removal of iliac screw which she tolerated without difficulty.  Consults: None  Significant Diagnostic Studies: None  Treatments: surgery: See op note  Discharge Exam: Blood pressure (!) 154/68, pulse 75, temperature 98.3 F (36.8 C), temperature source Oral, resp. rate 20, height 5\' 7"  (1.702 m), weight 81.2 kg, last menstrual period 06/06/1996, SpO2 99 %. Incision is clean and dry Station and gait are intact  Disposition: Discharge disposition: 01-Home or Self Care       Discharge Instructions     Call MD for:  redness, tenderness, or signs of infection (pain, swelling, redness, odor or green/yellow discharge around incision site)   Complete by: As directed    Call MD for:  severe uncontrolled pain   Complete by: As directed    Call MD for:  temperature >100.4   Complete by: As directed    Diet - low sodium heart healthy   Complete by: As directed    Discharge wound care:   Complete by: As directed    Okay to shower. Do not apply salves or appointments to incision. No heavy lifting with the upper extremities greater than 10 pounds. May resume driving when not requiring pain medication and patient feels comfortable with doing so.   Incentive spirometry RT   Complete by: As directed    Increase activity slowly   Complete by: As directed       Allergies as of 02/09/2021   No Known Allergies      Medication List     TAKE these medications    amLODipine 5 MG tablet Commonly known  as: NORVASC Take 5 mg by mouth at bedtime.   clobetasol ointment 0.05 % Commonly known as: TEMOVATE Apply topically 2 (two) times daily. As needed for itching   donepezil 5 MG tablet Commonly known as: ARICEPT Take 5 mg by mouth at bedtime.   enalapril 20 MG tablet Commonly known as: VASOTEC Take 20 mg by mouth 2 (two) times daily.   estradiol 0.5 MG tablet Commonly known as: ESTRACE Take 1 tablet (0.5 mg total) by mouth daily.   HYDROcodone-acetaminophen 5-325 MG tablet Commonly known as: NORCO/VICODIN Take 1-2 tablets by mouth every 6 (six) hours as needed for moderate pain or severe pain. What changed:  how much to take reasons to take this   meloxicam 15 MG tablet Commonly known as: MOBIC Take 15 mg by mouth daily.   methocarbamol 500 MG tablet Commonly known as: ROBAXIN Take 1 tablet (500 mg total) by mouth every 6 (six) hours as needed for muscle spasms.   nystatin ointment Commonly known as: MYCOSTATIN Apply 1 application topically 2 (two) times daily.   pravastatin 20 MG tablet Commonly known as: PRAVACHOL Take 20 mg by mouth at bedtime.   progesterone 100 MG capsule Commonly known as: PROMETRIUM Take 1 capsule (100 mg total) by mouth at bedtime.   progesterone 100 MG capsule Commonly known as: Prometrium Take 1 capsule (100 mg total) by mouth daily. What changed: when to take this   sertraline 50 MG tablet Commonly known as: ZOLOFT Take 50 mg  by mouth in the morning.   Vitamin D3 50 MCG (2000 UT) capsule Take 2,000 Units by mouth daily.               Discharge Care Instructions  (From admission, onward)           Start     Ordered   02/09/21 0000  Discharge wound care:       Comments: Okay to shower. Do not apply salves or appointments to incision. No heavy lifting with the upper extremities greater than 10 pounds. May resume driving when not requiring pain medication and patient feels comfortable with doing so.   02/09/21 1229              Signed: Shary Key Ladasha Schnackenberg 02/09/2021, 12:29 PM

## 2021-02-09 NOTE — Evaluation (Signed)
Occupational Therapy Evaluation Patient Details Name: [name] MRN: [id number] DOB: [birth year] Today's Date: 02/09/2021    History of Present Illness Pt is a 70 female presenting s/p R iliac screw removal on 9/6. PMH: anxiety, HTN, anemia, s/p fusion L1-L2.   Clinical Impression   PTA, pt was independent in ADLs, meal prep, and has assistance for cleaning. Pt with friends who are available to assist with tasks and driving until pt is able. Pt is currently requiring supervision for all ADLs and functional mobility with single point cane. Pt educated on spinal precautions and given handout with compensatory strategies for safe bed mobility, functional mobility, and ADL tasks.  Pt with decreased strength post surgery, but demonstrated good carryover of education from previous surgeries. Therefore, recommending 24 hr supervision upon discharge home. All OT needs met in the acute setting.     Follow Up Recommendations  No OT follow up;Supervision/Assistance - 24 hour    Equipment Recommendations  None recommended by OT    Recommendations for Other Services       Precautions / Restrictions Precautions Precautions: Back Precaution Booklet Issued: Yes (comment) Precaution Comments: Reviewed back precautions, pt unable to lift over 10 Ibs. Restrictions Weight Bearing Restrictions: No      Mobility Bed Mobility               General bed mobility comments: Pt seated EOB upon entry and exit.    Transfers Overall transfer level: Needs assistance Equipment used: Straight cane Transfers: Sit to/from Stand Sit to Stand: Supervision         General transfer comment: Supervision for safety.    Balance Overall balance assessment: Needs assistance Sitting-balance support: Feet supported;No upper extremity supported Sitting balance-Leahy Scale: Good     Standing balance support: Single extremity supported;During functional activity Standing balance-Leahy Scale:  Good Standing balance comment: Pt donned shirt in standing with no unsteadiness or LOB                           ADL either performed or assessed with clinical judgement   ADL Overall ADL's : Needs assistance/impaired                                       General ADL Comments: Pt supervision level for all ADLs for safety. Educated on back precautions for pain managment and provided handout on compensatory strategies for bathing, dressing, toileting, and functional mobility.     Vision Baseline Vision/History: 0 No visual deficits Ability to See in Adequate Light: 0 Adequate Patient Visual Report: No change from baseline Vision Assessment?: No apparent visual deficits     Perception     Praxis      Pertinent Vitals/Pain Pain Assessment: Faces Pain Score: 2  Faces Pain Scale: Hurts a little bit Pain Location: R Hip Pain Descriptors / Indicators: Operative site guarding;Guarding Pain Intervention(s): Monitored during session     Hand Dominance Right   Extremity/Trunk Assessment Upper Extremity Assessment Upper Extremity Assessment: Overall WFL for tasks assessed   Lower Extremity Assessment Lower Extremity Assessment: Defer to PT evaluation   Cervical / Trunk Assessment Cervical / Trunk Assessment: Other exceptions (s/p iliac hardware removal) Cervical / Trunk Exceptions: S/p R iliac screw removal. Previous L1-L2 fusion.   Communication Communication Communication: No difficulties   Cognition Arousal/Alertness: Awake/alert Behavior During Therapy: WFL for tasks assessed/performed  Overall Cognitive Status: Within Functional Limits for tasks assessed                                     General Comments       Exercises Exercises: Other exercises Other Exercises Other Exercises: Educated pt on walking program and holding other exercise routines until cleared by surgeon.   Shoulder Instructions      Home Living  Family/patient expects to be discharged to:: Private residence Living Arrangements: Alone Available Help at Discharge: Neighbor;Available PRN/intermittently Type of Home: House Home Access: Level entry     Home Layout: One level     Bathroom Shower/Tub: Occupational psychologist: Handicapped height Bathroom Accessibility: Yes   Home Equipment: [place] - single point;Walker - 2 wheels;Grab bars - tub/shower;Bedside commode;Shower seat - built in         Prior Functioning/Environment Level of Independence: Independent with assistive device(s)        Comments: PTA pt was Independent in light meal prep, driving, ADLs and ambulated with single-point cane. Pt has friends that can assist with transportation as needed.        OT Problem List: Decreased strength;Decreased activity tolerance;Decreased range of motion;Pain;Impaired balance (sitting and/or standing)      OT Treatment/Interventions:      OT Goals(Current goals can be found in the care plan section) Acute Rehab OT Goals Patient Stated Goal: To go to the movies OT Goal Formulation: With patient  OT Frequency:     Barriers to D/C:            Co-evaluation              AM-PAC OT "6 Clicks" Daily Activity     Outcome Measure Help from another person eating meals?: None Help from another person taking care of personal grooming?: A Little Help from another person toileting, which includes using toliet, bedpan, or urinal?: A Little Help from another person bathing (including washing, rinsing, drying)?: A Little Help from another person to put on and taking off regular upper body clothing?: A Little Help from another person to put on and taking off regular lower body clothing?: A Little 6 Click Score: 19   End of Session Equipment Utilized During Treatment: Gait belt Financial trader) Nurse Communication: Mobility status  Activity Tolerance: Patient tolerated treatment well Patient left: in bed;with call  bell/phone within reach (Pt sitting EOB upon arrival and departure, left bed alarm off.)  OT Visit Diagnosis: Unsteadiness on feet (R26.81);Other abnormalities of gait and mobility (R26.89);Muscle weakness (generalized) (M62.81);Pain Pain - part of body: Hip                Time: 1259-1320 OT Time Calculation (min): 21 min Charges:  OT General Charges $OT Visit: 1 Visit OT Evaluation $OT Eval Low Complexity: 1 Low  Jackquline Denmark, OTS Acute Rehab Office: [phone]   [name] 02/09/2021, 3:28 PM

## 2021-02-09 NOTE — Plan of Care (Signed)
Adequately Ready for discharge °

## 2021-02-10 ENCOUNTER — Encounter (HOSPITAL_COMMUNITY): Payer: Self-pay | Admitting: Neurological Surgery

## 2021-02-12 ENCOUNTER — Other Ambulatory Visit: Payer: Self-pay

## 2021-02-12 ENCOUNTER — Emergency Department (HOSPITAL_COMMUNITY)
Admission: EM | Admit: 2021-02-12 | Discharge: 2021-02-12 | Disposition: A | Discharge status: Home / Self Care | Payer: Medicare Other | Attending: Emergency Medicine | Admitting: Emergency Medicine

## 2021-02-12 ENCOUNTER — Encounter (HOSPITAL_COMMUNITY): Payer: Self-pay | Admitting: Emergency Medicine

## 2021-02-12 ENCOUNTER — Emergency Department (HOSPITAL_COMMUNITY): Payer: Medicare Other

## 2021-02-12 DIAGNOSIS — Z96652 Presence of left artificial knee joint: Secondary | ICD-10-CM | POA: Diagnosis not present

## 2021-02-12 DIAGNOSIS — I10 Essential (primary) hypertension: Secondary | ICD-10-CM | POA: Diagnosis not present

## 2021-02-12 DIAGNOSIS — Z79899 Other long term (current) drug therapy: Secondary | ICD-10-CM | POA: Diagnosis not present

## 2021-02-12 DIAGNOSIS — R194 Change in bowel habit: Secondary | ICD-10-CM | POA: Diagnosis present

## 2021-02-12 DIAGNOSIS — K5909 Other constipation: Secondary | ICD-10-CM | POA: Diagnosis not present

## 2021-02-12 MED ORDER — POLYETHYLENE GLYCOL 3350 17 G PO PACK: 17.0000 g | PACK | Freq: Every day | ORAL | 0 refills | Status: DC

## 2021-02-12 MED ORDER — FLEET ENEMA 7-19 GM/118ML RE ENEM
1.0000 | ENEMA | Freq: Once | RECTAL | Status: AC
  Administered 2021-02-12: 1 via RECTAL
  Filled 2021-02-12: qty 1

## 2021-02-12 NOTE — ED Notes (Signed)
X-ray at bedside at this time.

## 2021-02-12 NOTE — ED Notes (Signed)
Called pt's friend now that pt is cleaned up and back in bed to let her know that pt has given permission to come back now.

## 2021-02-12 NOTE — Discharge Instructions (Signed)
Stay hydrated and take MiraLAX daily for constipation  See your doctor for follow-up  Return to ER if you have severe abdominal pain or vomiting or worse constipation

## 2021-02-12 NOTE — ED Notes (Signed)
Pt's friend has had security call x4 to ask to come back. This nurse has told pt management each time that the pt does not want a visitor at this time. Pt is having an enema and then sitting on bedside commode to have a bowel movement and does not want a visitor in the room. Visitor continues to try to get back and to have security and visitor management call this nurse.

## 2021-02-12 NOTE — ED Triage Notes (Addendum)
Patient presents to the ED by EMS with c/o An episode of nausea and dizziness while straining to have a BM this morning. Her last BM was 4 days ago. Recent surgical procedure to remove hardware from her spine taking narcotics for pain. Pt reporting pain is unbearable, she is unable to maintain a sitting position.

## 2021-02-12 NOTE — ED Provider Notes (Signed)
Emergency Medicine Provider Triage Evaluation Note  [name] , a 80 y.o. female  was evaluated in triage.  Pt complains of rectal pain.  This has been worsening since today.  She states she had a surgical procedure to remove hardware four days ago and has not had a BM since.  She reports bad pain in her rectum.    Review of Systems  Positive: Rectal pain, constipation Negative: Vomiting.   Physical Exam  BP (!) 147/64   Pulse 86   Temp 98.2 F (36.8 C) (Oral)   Resp 18   LMP 06/06/1996   SpO2 100%  Gen:   Awake, appears uncomfortable.  Resp:  Normal effort  MSK:   Moves extremities without difficulty  Other:  Rectal exam deferred due to triage   Medical Decision Making  Medically screening exam initiated at 2:12 PM.  Appropriate orders placed.  [name] was informed that the remainder of the evaluation will be completed by another provider, this initial triage assessment does not replace that evaluation, and the importance of remaining in the ED until their evaluation is complete.  Patient is refusing labs in triage, along with any imaging.   States "just get it out, I don't need labs or imaging, just get it out."  Note: Portions of this report may have been transcribed using voice recognition software. Every effort was made to ensure accuracy; however, inadvertent computerized transcription errors may be present    Cristina Gong, PA-C 02/12/21 1417    Cathren Laine, MD 02/12/21 1506

## 2021-02-12 NOTE — ED Notes (Signed)
Alfredia Ferguson [phone] wants an update on the patient

## 2021-02-12 NOTE — ED Provider Notes (Signed)
[facility] EMERGENCY DEPARTMENT Provider Note   CSN: [id number] Arrival date & time: 02/12/21  1340     History Chief Complaint  Patient presents with   Constipation    [name] is a 80 y.o. female who presented with constipation.  Patient is postop day #3 after right superior posterior iliac crest hardware removal.  She is on chronic pain medicine.  She states that since the procedure, she has no bowel movement.  Patient states that she tried some stool softeners but felt a lot of rectal pressure.  Denies any vomiting.  Patient apparently had a large bowel movement in triage  The history is provided by the patient.      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   Trochanteric bursitis, right hip 01/14/2019   Lumbar radiculopathy, chronic 06/29/2018   OA (osteoarthritis) of knee 09/19/2016   Palpitations 12/09/2015   Right carotid bruit 12/09/2015   Syncope 12/08/2015   Degenerative disc disease, lumbar 12/08/2015   Essential hypertension 12/08/2015   High cholesterol    Arthritis     Past Surgical History:  Procedure Laterality Date   ANTERIOR LAT LUMBAR FUSION N/A 09/03/2019   Procedure: Lumbar one-two Anterolateral lumbar interbody fusion with lateral plate;  Surgeon: Kristeen Miss, MD;  Location: [facility];  Service: Neurosurgery;  Laterality: N/A;   BACK SURGERY     BREAST EXCISIONAL BIOPSY Right    x 2   BUNIONECTOMY     CHOLECYSTECTOMY     COLONOSCOPY     DIAGNOSTIC LAPAROSCOPY     EYE SURGERY Bilateral    CATARACT SURGERY   HARDWARE REMOVAL Right 02/09/2021   Procedure: removal of right iliac  crest screw with Met-rx;  Surgeon: Kristeen Miss, MD;  Location: [facility];  Service: Neurosurgery;  Laterality: Right;   LUMBAR FUSION  2011   OPEN SURGICAL REPAIR OF GLUTEAL TENDON Right 01/14/2019   Procedure: Right hip bursectomy; gluteal tendon repair;  Surgeon: Gaynelle Arabian, MD;  Location: [facility];  Service: Orthopedics;  Laterality: Right;  29mn   right knee replacement   2011   TONSILLECTOMY AND ADENOIDECTOMY     TOTAL KNEE ARTHROPLASTY Left 09/19/2016   Procedure: LEFT TOTAL KNEE ARTHROPLASTY;  Surgeon: FGaynelle Arabian MD;  Location: [facility];  Service: Orthopedics;  Laterality: Left;  requests 549ms with abductor block     OB History     Gravida  0   Para      Term      Preterm      AB      Living         SAB      IAB      Ectopic      Multiple      Live Births              Family History  Problem Relation Age of Onset   Stroke Mother    Cancer Father        colon   Diabetes Father    Breast cancer Maternal Aunt    Breast cancer Maternal Grandmother  Social History   Tobacco Use   Smoking status: Never   Smokeless tobacco: Never  Vaping Use   Vaping Use: Never used  Substance Use Topics   Alcohol use: No    Alcohol/week: 0.0 standard drinks   Drug use: No    Home Medications Prior to Admission medications   Medication Sig Start Date End Date Taking? Authorizing Provider  amLODipine (NORVASC) 5 MG tablet Take 5 mg by mouth at bedtime.    [provider]  Cholecalciferol (VITAMIN D3) 50 MCG (2000 UT) capsule Take 2,000 Units by mouth daily.    [provider]  clobetasol ointment (TEMOVATE) 0.05 % Apply topically 2 (two) times daily. As needed for itching Patient not taking: Reported on 01/26/2021 05/09/19   Fontaine, Belinda Block, MD  donepezil (ARICEPT) 5 MG tablet Take 5 mg by mouth at bedtime. 12/03/15   [provider]  enalapril (VASOTEC) 20 MG tablet Take 20 mg by mouth 2 (two) times daily.    [provider]  estradiol (ESTRACE) 0.5 MG tablet Take 1 tablet (0.5 mg total) by mouth daily. 05/26/20   Joseph Pierini, MD  HYDROcodone-acetaminophen (NORCO/VICODIN) 5-325 MG tablet Take 1-2 tablets by mouth every 6 (six) hours as needed for moderate pain or severe pain. 02/09/21   Kristeen Miss, MD  meloxicam (MOBIC) 15 MG tablet Take 15 mg by mouth daily.    [provider]  methocarbamol (ROBAXIN) 500 MG tablet Take 1 tablet (500 mg total) by mouth every 6 (six) hours as needed for muscle spasms. Patient not taking: Reported on 01/26/2021 09/04/19   Kristeen Miss, MD  nystatin ointment (MYCOSTATIN) Apply 1 application topically 2 (two) times daily. Patient not taking: Reported on 01/26/2021 10/25/19   Joseph Pierini, MD  pravastatin (PRAVACHOL) 20 MG tablet Take 20 mg by mouth at bedtime.    [provider]  progesterone (PROMETRIUM) 100 MG capsule Take 1 capsule (100 mg total) by mouth at bedtime. Patient not taking: Reported on 01/26/2021 05/09/19   Fontaine, Belinda Block, MD  progesterone (PROMETRIUM) 100 MG capsule Take 1 capsule (100 mg total) by mouth daily. Patient taking differently: Take 100 mg by mouth at bedtime. 05/26/20   Joseph Pierini, MD  sertraline (ZOLOFT) 50 MG tablet Take 50 mg by mouth in the morning.    [provider]    Allergies    Patient has no known allergies.  Review of Systems   Review of Systems  Gastrointestinal:  Positive for constipation.  All other systems reviewed and are negative.  Physical Exam Updated Vital Signs BP (!) 164/69 (BP Location: Left Arm)   Pulse 78   Temp 98.2 F (36.8 C) (Oral)   Resp 18   LMP 06/06/1996   SpO2 99%   Physical Exam Vitals and nursing note reviewed.  Constitutional:      Comments: Slightly uncomfortable  HENT:     Head: Normocephalic.     Nose: Nose normal.     Mouth/Throat:     Mouth: Mucous membranes are moist.  Eyes:     Extraocular Movements: Extraocular movements intact.      Pupils: Pupils are equal, round, and reactive to light.  Cardiovascular:     Rate and Rhythm: Normal rate and regular rhythm.     Pulses: Normal pulses.     Heart sounds: Normal heart sounds.  Pulmonary:     Effort: Pulmonary effort is normal.     Breath sounds: Normal breath sounds.  Abdominal:  Comments: Mildly distended and nontender  Genitourinary:    Comments: Rectal- large amount of brown stool, no obvious stool impaction  Musculoskeletal:        General: Normal range of motion.     Cervical back: Normal range of motion and neck supple.  Skin:    General: Skin is warm.     Capillary Refill: Capillary refill takes less than 2 seconds.  Neurological:     General: No focal deficit present.     Mental Status: She is alert and oriented to person, place, and time.  Psychiatric:        Mood and Affect: Mood normal.        Behavior: Behavior normal.    ED Results / Procedures / Treatments   Labs (all labs ordered are listed, but only abnormal results are displayed) Labs Reviewed - No data to display  EKG None  Radiology No results found.  Procedures Procedures   Medications Ordered in ED Medications  sodium phosphate (FLEET) 7-19 GM/118ML enema 1 enema (has no administration in time range)    ED Course  I have reviewed the triage vital signs and the nursing notes.  Pertinent labs & imaging results that were available during my care of the patient were reviewed by me and considered in my medical decision making (see chart for details).    MDM Rules/Calculators/A&P                          [name] is a 80 y.o. female here with constipation.  Patient did have a large bowel movement while in triage.  Patient has no obvious stool impaction on exam.  Patient refused labs in triage and states that she had normal labs several days ago.  She had normal postop labs about a week ago. In particular her potassium is normal and she has no vomiting. Will get  x-ray and will also give enema.   7:00 PM X-ray showed ileus.  Patient had several soft stools after enema.  Patient is feeling better now.  Stable for discharge.  Told her that she should take MiraLAX  Final Clinical Impression(s) / ED Diagnoses Final diagnoses:  None    Rx / DC Orders ED Discharge Orders     None        Drenda Freeze, MD 02/12/21 1900

## 2021-02-12 NOTE — ED Notes (Signed)
Pt attempting to have bowel movement at this time

## 2021-02-12 NOTE — ED Notes (Addendum)
Pt had minimal results from enema. She does not feel that she needs to go anymore, however. Pt has stool all over her back and legs from bowel movement she reported she had in the lobby. Pt cleaned up, fresh linens placed on bed.

## 2021-04-16 ENCOUNTER — Other Ambulatory Visit: Payer: Self-pay | Admitting: Orthopedic Surgery

## 2021-04-16 DIAGNOSIS — M25551 Pain in right hip: Secondary | ICD-10-CM

## 2021-05-08 ENCOUNTER — Ambulatory Visit
Admission: RE | Admit: 2021-05-08 | Discharge: 2021-05-08 | Disposition: A | Discharge status: Home / Self Care | Payer: Medicare Other | Source: Ambulatory Visit | Attending: Orthopedic Surgery | Admitting: Orthopedic Surgery

## 2021-05-08 ENCOUNTER — Other Ambulatory Visit: Payer: Self-pay

## 2021-05-08 DIAGNOSIS — M25551 Pain in right hip: Secondary | ICD-10-CM

## 2021-06-08 ENCOUNTER — Other Ambulatory Visit: Payer: Self-pay

## 2021-06-08 MED ORDER — ESTRADIOL 0.5 MG PO TABS: 0.5000 mg | ORAL_TABLET | Freq: Every day | ORAL | 0 refills | Status: DC

## 2021-06-08 NOTE — Telephone Encounter (Signed)
AEX scheduled for 07/02/21. Mammo UTD. Last documented DEXA was 2019 per JKs notes on 05/26/20.

## 2021-06-16 ENCOUNTER — Other Ambulatory Visit: Payer: Self-pay

## 2021-06-16 MED ORDER — PROGESTERONE MICRONIZED 100 MG PO CAPS: 100.0000 mg | ORAL_CAPSULE | Freq: Every day | ORAL | 0 refills | Status: DC

## 2021-06-16 NOTE — Telephone Encounter (Signed)
Last AEX 05/26/20 with Dr. Delilah Shan. Scheduled 07/02/2021 with Dr. Quincy Simmonds. Neg Mammo 09/12/2020  Estradiol was refilled for #30 on 06/08/2021.

## 2021-06-17 ENCOUNTER — Other Ambulatory Visit: Payer: Self-pay | Admitting: Internal Medicine

## 2021-06-17 DIAGNOSIS — Z1231 Encounter for screening mammogram for malignant neoplasm of breast: Secondary | ICD-10-CM

## 2021-06-22 NOTE — Progress Notes (Deleted)
81 y.o. G0P0 Single Caucasian female here for annual exam.    PCP:     Patient's last menstrual period was 06/06/1996.           Sexually active: {yes no:314532}  The current method of family planning is {contraception:315051}.    Exercising: {yes no:314532}  {types:19826} Smoker:  {YES [id number]  Health Maintenance: Pap:  2012 normal History of abnormal Pap:  {YES NO:22349} MMG:  09-12-20 normal Colonoscopy:  2013 BMD:   2013  Result  *** TDaP:  *** Gardasil:   no HIV: Hep C: Screening Labs:  Hb today: ***, Urine today: ***   reports that she has never smoked. She has never used smokeless tobacco. She reports that she does not drink alcohol and does not use drugs.  Past Medical History:  Diagnosis Date   Anxiety    Arthritis    Bartholin's gland abscess 04/2017   Left, incised and drained   High cholesterol    History of iron deficiency anemia    Hypertension     Past Surgical History:  Procedure Laterality Date   ANTERIOR LAT LUMBAR FUSION N/A 09/03/2019   Procedure: Lumbar one-two Anterolateral lumbar interbody fusion with lateral plate;  Surgeon: Kristeen Miss, MD;  Location: [facility];  Service: Neurosurgery;  Laterality: N/A;   BACK SURGERY     BREAST EXCISIONAL BIOPSY Right    x 2   BUNIONECTOMY     CHOLECYSTECTOMY     COLONOSCOPY     DIAGNOSTIC LAPAROSCOPY     EYE SURGERY Bilateral    CATARACT SURGERY   HARDWARE REMOVAL Right 02/09/2021   Procedure: removal of right iliac crest screw with Met-rx;  Surgeon: Kristeen Miss, MD;  Location: [facility];  Service: Neurosurgery;  Laterality: Right;   LUMBAR FUSION  2011   OPEN SURGICAL REPAIR OF GLUTEAL TENDON Right 01/14/2019   Procedure: Right hip bursectomy; gluteal tendon repair;  Surgeon: Gaynelle Arabian, MD;  Location: [facility];  Service: Orthopedics;  Laterality: Right;  39mn   right knee replacement   2011   TONSILLECTOMY AND ADENOIDECTOMY     TOTAL KNEE ARTHROPLASTY Left 09/19/2016   Procedure: LEFT TOTAL KNEE  ARTHROPLASTY;  Surgeon: FGaynelle Arabian MD;  Location: [facility];  Service: Orthopedics;  Laterality: Left;  requests 552ms with abductor block    Current Outpatient Medications  Medication Sig Dispense Refill   amLODipine (NORVASC) 5 MG tablet Take 5 mg by mouth at bedtime.     Cholecalciferol (VITAMIN D3) 50 MCG (2000 UT) capsule Take 2,000 Units by mouth daily.     clobetasol ointment (TEMOVATE) 0.05 % Apply topically 2 (two) times daily. As needed for itching (Patient not taking: Reported on 01/26/2021) 30 g 3   donepezil (ARICEPT) 5 MG tablet Take 5 mg by mouth at bedtime.     enalapril (VASOTEC) 20 MG tablet Take 20 mg by mouth 2 (two) times daily.     estradiol (ESTRACE) 0.5 MG tablet Take 1 tablet (0.5 mg total) by mouth daily. 30 tablet 0   HYDROcodone-acetaminophen (NORCO/VICODIN) 5-325 MG tablet Take 1-2 tablets by mouth every 6 (six) hours as needed for moderate pain or severe pain. 30 tablet 0   meloxicam (MOBIC) 15 MG tablet Take 15 mg by mouth daily.     methocarbamol (ROBAXIN) 500 MG tablet Take 1 tablet (500 mg total) by mouth every 6 (six) hours as needed for muscle spasms. (Patient not taking: Reported on 01/26/2021) 40 tablet 3   nystatin ointment (MYCOSTATIN)  Apply 1 application topically 2 (two) times daily. (Patient not taking: Reported on 01/26/2021) 30 g 0   polyethylene glycol (MIRALAX / GLYCOLAX) 17 g packet Take 17 g by mouth daily. 14 each 0   pravastatin (PRAVACHOL) 20 MG tablet Take 20 mg by mouth at bedtime.     progesterone (PROMETRIUM) 100 MG capsule Take 1 capsule (100 mg total) by mouth at bedtime. (Patient not taking: Reported on 01/26/2021) 90 capsule 4   progesterone (PROMETRIUM) 100 MG capsule Take 1 capsule (100 mg total) by mouth daily. 30 capsule 0   sertraline (ZOLOFT) 50 MG tablet Take 50 mg by mouth in the morning.     No current facility-administered medications for this visit.    Family History  Problem Relation Age of Onset   Stroke Mother     Cancer Father        colon   Diabetes Father    Breast cancer Maternal Aunt    Breast cancer Maternal Grandmother     Review of Systems  Exam:   LMP 06/06/1996     General appearance: alert, cooperative and appears stated age Head: normocephalic, without obvious abnormality, atraumatic Neck: no adenopathy, supple, symmetrical, trachea midline and thyroid normal to inspection and palpation Lungs: clear to auscultation bilaterally Breasts: normal appearance, no masses or tenderness, No nipple retraction or dimpling, No nipple discharge or bleeding, No axillary adenopathy Heart: regular rate and rhythm Abdomen: soft, non-tender; no masses, no organomegaly Extremities: extremities normal, atraumatic, no cyanosis or edema Skin: skin color, texture, turgor normal. No rashes or lesions Lymph nodes: cervical, supraclavicular, and axillary nodes normal. Neurologic: grossly normal  Pelvic: External genitalia:  no lesions              No abnormal inguinal nodes palpated.              Urethra:  normal appearing urethra with no masses, tenderness or lesions              Bartholins and Skenes: normal                 Vagina: normal appearing vagina with normal color and discharge, no lesions              Cervix: no lesions              Pap taken: {yes no:314532} Bimanual Exam:  Uterus:  normal size, contour, position, consistency, mobility, non-tender              Adnexa: no mass, fullness, tenderness              Rectal exam: {yes no:314532}.  Confirms.              Anus:  normal sphincter tone, no lesions  Chaperone was present for exam:  ***  Assessment:   Well woman visit with gynecologic exam.   Plan: Mammogram screening discussed. Self breast awareness reviewed. Pap and HR HPV as above. Guidelines for Calcium, Vitamin D, regular exercise program including cardiovascular and weight bearing exercise.   Follow up annually and prn.   Additional counseling given.  {yes  [id number]. _______ minutes face to face time of which over 50% was spent in counseling.    After visit summary provided.

## 2021-07-02 ENCOUNTER — Ambulatory Visit: Payer: Medicare Other | Admitting: Obstetrics and Gynecology

## 2021-07-06 ENCOUNTER — Other Ambulatory Visit: Payer: Self-pay | Admitting: *Deleted

## 2021-07-06 NOTE — Telephone Encounter (Signed)
Annual exam scheduled on 07/07/21

## 2021-07-07 ENCOUNTER — Other Ambulatory Visit: Payer: Self-pay

## 2021-07-07 ENCOUNTER — Ambulatory Visit (INDEPENDENT_AMBULATORY_CARE_PROVIDER_SITE_OTHER): Payer: Medicare Other | Admitting: Obstetrics and Gynecology

## 2021-07-07 ENCOUNTER — Encounter: Payer: Self-pay | Admitting: Obstetrics and Gynecology

## 2021-07-07 VITALS — BP 136/74 | HR 93 | Ht 64.0 in | Wt 177.0 lb

## 2021-07-07 DIAGNOSIS — Z79890 Hormone replacement therapy (postmenopausal): Secondary | ICD-10-CM | POA: Diagnosis not present

## 2021-07-07 DIAGNOSIS — Z5181 Encounter for therapeutic drug level monitoring: Secondary | ICD-10-CM | POA: Diagnosis not present

## 2021-07-07 MED ORDER — ESTRADIOL 0.5 MG PO TABS: ORAL_TABLET | ORAL | 0 refills | Status: AC

## 2021-07-07 MED ORDER — PROGESTERONE MICRONIZED 100 MG PO CAPS: 100.0000 mg | ORAL_CAPSULE | Freq: Every day | ORAL | 0 refills | Status: AC

## 2021-07-07 NOTE — Progress Notes (Signed)
81 y.o. G0P0 Single Caucasian female here for annual breast and pelvic exam.  Patient refuses breast exam.  Patient currently on Estrace 0.59m and Prometrium 1054m Still has hot flashes but not like it was in the past.  Tried to stop in the past, and she had increased hot flashes.  She tries to take the medication every other day.  She would eventually like to stop her HRT.  No vaginal bleeding.   Having back issues.   PCP:  RiBurnard BuntingMD   Patient's last menstrual period was 06/06/1996.           Sexually active: No.  The current method of family planning is post menopausal status.    Exercising: No.   stretches Smoker:  no  Health Maintenance: Pap:  2012 normal History of abnormal Pap:  no MMG:  09-12-20 Neg/Birads1 Colonoscopy:  2013--patient has aged out BMD:   09-08-20 Result :Normal  w/Dr.Aronson TDaP:  04-15-19 Td Gardasil:   no HIV:no Hep C:no Screening Labs:  PCP   reports that she has never smoked. She has never used smokeless tobacco. She reports that she does not drink alcohol and does not use drugs.  Past Medical History:  Diagnosis Date   Anxiety    Arthritis    Bartholin's gland abscess 04/2017   Left, incised and drained   High cholesterol    History of iron deficiency anemia    Hypertension     Past Surgical History:  Procedure Laterality Date   ANTERIOR LAT LUMBAR FUSION N/A 09/03/2019   Procedure: Lumbar one-two Anterolateral lumbar interbody fusion with lateral plate;  Surgeon: ElKristeen MissMD;  [facility] Service: Neurosurgery;  Laterality: N/A;   BACK SURGERY     BREAST EXCISIONAL BIOPSY Right    x 2   BUNIONECTOMY     CHOLECYSTECTOMY     COLONOSCOPY     DIAGNOSTIC LAPAROSCOPY     EYE SURGERY Bilateral    CATARACT SURGERY   HARDWARE REMOVAL Right 02/09/2021   Procedure: removal of right iliac crest screw with Met-rx;  Surgeon: ElKristeen MissMD;  [facility] Service: [place]surgery;  Laterality: Right;   LUMBAR FUSION   2011   OPEN SURGICAL REPAIR OF GLUTEAL TENDON Right 01/14/2019   Procedure: Right hip bursectomy; gluteal tendon repair;  Surgeon: AlGaynelle ArabianMD;  Location: [facility];  Service: Orthopedics;  Laterality: Right;  6074m  right knee replacement   2011   TONSILLECTOMY AND ADENOIDECTOMY     TOTAL KNEE ARTHROPLASTY Left 09/19/2016   Procedure: LEFT TOTAL KNEE ARTHROPLASTY;  Surgeon: FraGaynelle ArabianD;  Location: [facility];  Service: Orthopedics;  Laterality: Left;  requests 87m41mwith abductor block    Current Outpatient Medications  Medication Sig Dispense Refill   amLODipine (NORVASC) 5 MG tablet Take 5 mg by mouth at bedtime.     Cholecalciferol (VITAMIN D3) 50 MCG (2000 UT) capsule Take 2,000 Units by mouth daily.     clobetasol ointment (TEMOVATE) 0.05 % Apply topically 2 (two) times daily. As needed for itching 30 g 3   donepezil (ARICEPT) 5 MG tablet Take 1 tablet by mouth daily.     enalapril (VASOTEC) 20 MG tablet Take 20 mg by mouth 2 (two) times daily.     estradiol (ESTRACE) 0.5 MG tablet Take 1 tablet (0.5 mg total) by mouth daily. 30 tablet 0   gabapentin (NEURONTIN) 300 MG capsule Take 1 capsule by mouth 2 (two) times daily.  HYDROcodone-acetaminophen (NORCO/VICODIN) 5-325 MG tablet 1-2 tablet as needed     meloxicam (MOBIC) 15 MG tablet Take 15 mg by mouth daily.     methocarbamol (ROBAXIN) 500 MG tablet Take 1 tablet (500 mg total) by mouth every 6 (six) hours as needed for muscle spasms. 40 tablet 3   pravastatin (PRAVACHOL) 20 MG tablet Take 20 mg by mouth at bedtime.     progesterone (PROMETRIUM) 100 MG capsule Take 1 capsule (100 mg total) by mouth daily. 30 capsule 0   sertraline (ZOLOFT) 50 MG tablet Take 1 tablet by mouth daily.     No current facility-administered medications for this visit.    Family History  Problem Relation Age of Onset   Stroke Mother    Cancer Father        colon   Diabetes Father    Breast cancer Maternal Aunt    Breast cancer Maternal  Grandmother     Review of Systems  All other systems reviewed and are negative.  Exam:   BP 136/74    Pulse 93    Ht _0  (1.626 m)    Wt 177 lb (80.3 kg)    LMP 06/06/1996    SpO2 97%    BMI 30.38 kg/m     General appearance: alert, cooperative and appears stated age   Lungs: clear to auscultation bilaterally Breasts: declined by patient.  Heart: regular rate and rhythm Abdomen: soft, non-tender; no masses, no organomegaly Inguinal nodes:  normal.   Pelvic: External genitalia:  no lesions              No abnormal inguinal nodes palpated.              Urethra:  normal appearing urethra with no masses, tenderness or lesions              Bartholins and Skenes: normal                 Vagina: normal appearing vagina with normal color and discharge, no lesions              Cervix: unable to see.  Patient with difficulty tolerating the exam.               Pap taken: no Bimanual Exam:  Uterus:  normal size, contour, position, consistency, mobility, non-tender              Adnexa: no mass, fullness, tenderness              Rectal exam: yes.  Confirms.              Anus:  normal sphincter tone, no lesions  Chaperone was present for exam:  Estill Bamberg, CMA  Assessment:   HRT. Medication monitoring encounter.   Plan: Mammogram screening discussed. Self breast awareness reviewed. Pap not needed. Guidelines for Calcium, Vitamin D, regular exercise program including cardiovascular and weight bearing exercise. Discused WHI and risks and benefits of HRT.  Use of HRT can increase risk of PE, DVT, MI, stroke and breast cancer.  Benefits of HRT are to treat vasomotor symptoms, reduce risk of osteoporosis, and reduce risk of colon cancer.  Patient would like to wean off HRT.  She will take estradiol 0.5 mg, 1/2 tablet (0.25 mg) by mouth daily for 3 months and then stop.  #45, RF none.  She will take Prometrium 100 mg by mouth daily for 3 months and then stop.  #90, RF none.  If the patient  requests to continue her hormone therapy, she will need to return for an office visit and a breast exam.   She may choose to discontinue regular gynecologic visits and return only for problem visits.    After visit summary provided.   30 min  total time was spent for this patient encounter, including preparation, face-to-face counseling with the patient, coordination of care, and documentation of the encounter.

## 2021-07-19 ENCOUNTER — Other Ambulatory Visit: Payer: Self-pay | Admitting: Neurological Surgery

## 2021-07-19 DIAGNOSIS — S22080A Wedge compression fracture of T11-T12 vertebra, initial encounter for closed fracture: Secondary | ICD-10-CM

## 2021-08-01 ENCOUNTER — Ambulatory Visit
Admission: RE | Admit: 2021-08-01 | Discharge: 2021-08-01 | Disposition: A | Discharge status: Home / Self Care | Payer: Medicare Other | Source: Ambulatory Visit | Attending: Neurological Surgery | Admitting: Neurological Surgery

## 2021-08-01 ENCOUNTER — Other Ambulatory Visit: Payer: Self-pay

## 2021-08-01 DIAGNOSIS — S22080A Wedge compression fracture of T11-T12 vertebra, initial encounter for closed fracture: Secondary | ICD-10-CM

## 2021-08-05 ENCOUNTER — Other Ambulatory Visit: Payer: Self-pay | Admitting: Neurological Surgery

## 2021-08-05 DIAGNOSIS — S22080A Wedge compression fracture of T11-T12 vertebra, initial encounter for closed fracture: Secondary | ICD-10-CM

## 2021-08-27 ENCOUNTER — Other Ambulatory Visit: Payer: Self-pay

## 2021-08-27 ENCOUNTER — Ambulatory Visit
Admission: RE | Admit: 2021-08-27 | Discharge: 2021-08-27 | Disposition: A | Discharge status: Home / Self Care | Payer: Medicare Other | Source: Ambulatory Visit | Attending: Neurological Surgery | Admitting: Neurological Surgery

## 2021-08-27 DIAGNOSIS — S22080A Wedge compression fracture of T11-T12 vertebra, initial encounter for closed fracture: Secondary | ICD-10-CM

## 2021-09-13 ENCOUNTER — Ambulatory Visit
Admission: RE | Admit: 2021-09-13 | Discharge: 2021-09-13 | Disposition: A | Discharge status: Home / Self Care | Payer: Medicare Other | Source: Ambulatory Visit | Attending: Internal Medicine | Admitting: Internal Medicine

## 2021-09-13 DIAGNOSIS — Z1231 Encounter for screening mammogram for malignant neoplasm of breast: Secondary | ICD-10-CM

## 2021-09-21 ENCOUNTER — Other Ambulatory Visit: Payer: Self-pay | Admitting: Neurological Surgery

## 2021-09-24 NOTE — Progress Notes (Signed)
Surgical Instructions ? ? ? Your procedure is scheduled on September 28, 2021. ? Report to [facility] Main Entrance "A" at 5:30 A.M., then check in with the Admitting office. ? Call this number if you have problems the morning of surgery: ? [phone] ? ? If you have any questions prior to your surgery date call [phone]: Open Monday-Friday 8am-4pm ? ? ? Remember: ? Do not eat after midnight the night before your surgery ? ?You may drink clear liquids until 4:30am the morning of your surgery.   ?Clear liquids allowed are: Water, Non-Citrus Juices (without pulp), Carbonated Beverages, Clear Tea, Black Coffee ONLY (NO MILK, CREAM OR POWDERED CREAMER of any kind), and Gatorade ?  ? Take these medicines the morning of surgery with A SIP OF WATER:  ?Sertraline (Zoloft) ? ?If needed: ?Gabapentin (Neurontin) ?Hydrocodone-Acetaminophen (Norco/Vicodin) ?Methocarbamol (Robaxin) ? ?As of today, STOP taking any Aspirin (unless otherwise instructed by your surgeon) Aleve, Naproxen, Ibuprofen, Motrin, Advil, Goody's, BC's, all herbal medications, fish oil, and all vitamins. This includes Meloxicam (MOBIC). ? ?         ?Do not wear jewelry or makeup ?Do not wear lotions, powders, perfumes, or deodorant. ?Do not shave 48 hours prior to surgery.   ?Do not bring valuables to the hospital. ?Do not wear nail polish, gel polish, artificial nails, or any other type of covering on natural nails (fingers and toes) ?If you have artificial nails or gel coating that need to be removed by a nail salon, please have this removed prior to surgery. Artificial nails or gel coating may interfere with anesthesia's ability to adequately monitor your vital signs. ? ?[facility] is not responsible for any belongings or valuables. .  ? ?Do NOT Smoke (Tobacco/Vaping)  24 hours prior to your procedure ? ?If you use a CPAP at night, you may bring your mask for your overnight stay. ?  ?Contacts, glasses, hearing aids, dentures or partials may not be worn  into surgery, please bring cases for these belongings ?  ?For patients admitted to the hospital, discharge time will be determined by your treatment team. ?  ?Patients discharged the day of surgery will not be allowed to drive home, and someone needs to stay with them for 24 hours. ? ? ?SURGICAL WAITING ROOM VISITATION ?Patients having surgery or a procedure in a hospital may have two support people. ?Children under the age of 53 must have an adult with them who is not the patient. ?They may stay in the waiting area during the procedure and may switch out with other visitors. If the patient needs to stay at the hospital during part of their recovery, the visitor guidelines for inpatient rooms apply. ? ?Please refer to the [facility] website for the visitor guidelines for Inpatients (after your surgery is over and you are in a regular room).  ? ? ? ? ? ?Special instructions:   ? ?Oral Hygiene is also important to reduce your risk of infection.  Remember - BRUSH YOUR TEETH THE MORNING OF SURGERY WITH YOUR REGULAR TOOTHPASTE ? ? ?[facility]- Preparing For Surgery ? ?Before surgery, you can play an important role. Because skin is not sterile, your skin needs to be as free of germs as possible. You can reduce the number of germs on your skin by washing with CHG (chlorahexidine gluconate) Soap before surgery.  CHG is an antiseptic cleaner which kills germs and bonds with the skin to continue killing germs even after washing.   ? ? ?Please  do not use if you have an allergy to CHG or antibacterial soaps. If your skin becomes reddened/irritated stop using the CHG.  ?Do not shave (including legs and underarms) for at least 48 hours prior to first CHG shower. It is OK to shave your face. ? ?Please follow these instructions carefully. ?  ? ? Shower the NIGHT BEFORE SURGERY and the MORNING OF SURGERY with CHG Soap.  ? If you chose to wash your hair, wash your hair first as usual with your normal shampoo. After you shampoo,  rinse your hair and body thoroughly to remove the shampoo.  Then [employer] and genitals (private parts) with your normal soap and rinse thoroughly to remove soap. ? ?After that Use CHG Soap as you would any other liquid soap. You can apply CHG directly to the skin and wash gently with a scrungie or a clean washcloth.  ? ?Apply the CHG Soap to your body ONLY FROM THE NECK DOWN.  Do not use on open wounds or open sores. Avoid contact with your eyes, ears, mouth and genitals (private parts). Wash Face and genitals (private parts)  with your normal soap.  ? ?Wash thoroughly, paying special attention to the area where your surgery will be performed. ? ?Thoroughly rinse your body with warm water from the neck down. ? ?DO NOT shower/wash with your normal soap after using and rinsing off the CHG Soap. ? ?Pat yourself dry with a CLEAN TOWEL. ? ?Wear CLEAN PAJAMAS to bed the night before surgery ? ?Place CLEAN SHEETS on your bed the night before your surgery ? ?DO NOT SLEEP WITH PETS. ? ? ?Day of Surgery: ? ?Take a shower with CHG soap. ?Wear Clean/Comfortable clothing the morning of surgery ?Do not apply any deodorants/lotions.   ?Remember to brush your teeth WITH YOUR REGULAR TOOTHPASTE. ? ? ? ?If you received a COVID test during your pre-op visit, it is requested that you wear a mask when out in public, stay away from anyone that may not be feeling well, and notify your surgeon if you develop symptoms. If you have been in contact with anyone that has tested positive in the last 10 days, please notify your surgeon. ? ?  ?Please read over the following fact sheets that you were given.  ? ?

## 2021-09-27 ENCOUNTER — Encounter (HOSPITAL_COMMUNITY)
Admission: RE | Admit: 2021-09-27 | Discharge: 2021-09-27 | Disposition: A | Discharge status: Home / Self Care | Payer: Medicare Other | Source: Ambulatory Visit | Attending: Neurological Surgery | Admitting: Neurological Surgery

## 2021-09-27 ENCOUNTER — Encounter (HOSPITAL_COMMUNITY): Payer: Self-pay | Admitting: Neurological Surgery

## 2021-09-27 ENCOUNTER — Encounter (HOSPITAL_COMMUNITY): Payer: Self-pay

## 2021-09-27 ENCOUNTER — Other Ambulatory Visit: Payer: Self-pay

## 2021-09-27 VITALS — BP 158/77 | HR 83 | Temp 97.8°F | Resp 18 | Ht 67.0 in | Wt 185.5 lb

## 2021-09-27 DIAGNOSIS — Z79899 Other long term (current) drug therapy: Secondary | ICD-10-CM | POA: Insufficient documentation

## 2021-09-27 DIAGNOSIS — I10 Essential (primary) hypertension: Secondary | ICD-10-CM

## 2021-09-27 DIAGNOSIS — Z01812 Encounter for preprocedural laboratory examination: Secondary | ICD-10-CM | POA: Insufficient documentation

## 2021-09-27 DIAGNOSIS — Z01818 Encounter for other preprocedural examination: Secondary | ICD-10-CM

## 2021-09-27 LAB — TYPE AND SCREEN
ABO/RH(D): O NEG
Antibody Screen: NEGATIVE

## 2021-09-27 LAB — BASIC METABOLIC PANEL
Anion gap: 7 (ref 5–15)
BUN: 9 mg/dL (ref 8–23)
CO2: 27 mmol/L (ref 22–32)
Calcium: 9.2 mg/dL (ref 8.9–10.3)
Chloride: 106 mmol/L (ref 98–111)
Creatinine, Ser: 0.72 mg/dL (ref 0.44–1.00)
GFR, Estimated: >60 mL/min (ref >60)
Glucose, Bld: 102 mg/dL — ABNORMAL HIGH (ref 70–99)
Potassium: 3.5 mmol/L (ref 3.5–5.1)
Sodium: 140 mmol/L (ref 135–145)

## 2021-09-27 LAB — CBC
HCT: 38.5 % (ref 36.0–46.0)
Hemoglobin: 12.7 g/dL (ref 12.0–15.0)
MCH: 30.3 pg (ref 26.0–34.0)
MCHC: 33 g/dL (ref 30.0–36.0)
MCV: 91.9 fL (ref 80.0–100.0)
Platelets: 235 10*3/uL (ref 150–400)
RBC: 4.19 MIL/uL (ref 3.87–5.11)
RDW: 13.4 % (ref 11.5–15.5)
WBC: 5.8 10*3/uL (ref 4.0–10.5)
nRBC: 0 % (ref 0.0–0.2)

## 2021-09-27 LAB — SURGICAL PCR SCREEN
MRSA, PCR: NEGATIVE
Staphylococcus aureus: NEGATIVE

## 2021-09-27 NOTE — Anesthesia Preprocedure Evaluation (Addendum)
Anesthesia Evaluation  ?Patient identified by MRN, date of birth, ID band ?Patient awake ? ? ? ?Reviewed: ?Allergy & Precautions, NPO status , Patient's Chart, lab work & pertinent test results ? ?Airway ?Mallampati: III ? ?TM Distance: >3 FB ?Neck ROM: Full ? ? ? Dental ? ?(+) Chipped, Dental Advisory Given,  ?  ?Pulmonary ?neg pulmonary ROS,  ?  ?Pulmonary exam normal ?breath sounds clear to auscultation ? ? ? ? ? ? Cardiovascular ?hypertension, Pt. on medications ?Normal cardiovascular exam ?Rhythm:Regular Rate:Normal ? ?HLD ?  ?Neuro/Psych ?PSYCHIATRIC DISORDERS Anxiety negative neurological ROS ?   ? GI/Hepatic ?negative GI ROS, Neg liver ROS,   ?Endo/Other  ?negative endocrine ROS ? Renal/GU ?negative Renal ROS  ?negative genitourinary ?  ?Musculoskeletal ? ?(+) Arthritis , narcotic dependent ? Abdominal ?  ?Peds ? Hematology ?negative hematology ROS ?(+)   ?Anesthesia Other Findings ? ? Reproductive/Obstetrics ? ?  ? ? ? ? ? ? ? ? ? ? ? ? ? ?  ?  ? ? ? ? ? ? ? ?Anesthesia Physical ?Anesthesia Plan ? ?ASA: 2 ? ?Anesthesia Plan: General  ? ?Post-op Pain Management: Tylenol PO (pre-op)*  ? ?Induction: Intravenous ? ?PONV Risk Score and Plan: 3 and Dexamethasone, Ondansetron and Treatment may vary due to age or medical condition ? ?Airway Management Planned: Oral ETT ? ?Additional Equipment:  ? ?Intra-op Plan:  ? ?Post-operative Plan: Extubation in OR ? ?Informed Consent: I have reviewed the patients History and Physical, chart, labs and discussed the procedure including the risks, benefits and alternatives for the proposed anesthesia with the patient or authorized representative who has indicated his/her understanding and acceptance.  ? ? ? ?Dental advisory given ? ?Plan Discussed with: CRNA ? ?Anesthesia Plan Comments:   ? ? ? ? ? ? ?Anesthesia Quick Evaluation ? ?

## 2021-09-27 NOTE — Progress Notes (Signed)
PCP - Dr. Geoffry Paradise ?Cardiologist - denies ? ?PPM/ICD - denies ? ?Chest x-ray - 02/09/10 ?EKG - 02/01/21 ?Stress Test - denies ?ECHO - denies ?Cardiac Cath - denies ? ?Sleep Study - denies ? ? ?DM- denies ? ?ASA/Blood Thinner Instructions: n/a ? ? ?ERAS Protcol - yes, no drink ? ? ?COVID TEST- n/a ? ? ?Anesthesia review: no ? ?Patient denies shortness of breath, fever, cough and chest pain at PAT appointment ? ? ?All instructions explained to the patient, with a verbal understanding of the material. Patient agrees to go over the instructions while at home for a better understanding. Patient also instructed to notify surgeon of any contact with COVID+ person or if she develops any symptoms. The opportunity to ask questions was provided. ?  ?

## 2021-09-28 ENCOUNTER — Other Ambulatory Visit: Payer: Self-pay

## 2021-09-28 ENCOUNTER — Encounter (HOSPITAL_COMMUNITY): Payer: Self-pay | Admitting: Neurological Surgery

## 2021-09-28 ENCOUNTER — Inpatient Hospital Stay (HOSPITAL_COMMUNITY): Payer: Medicare Other

## 2021-09-28 ENCOUNTER — Inpatient Hospital Stay (HOSPITAL_COMMUNITY): Payer: Medicare Other | Admitting: Certified Registered Nurse Anesthetist

## 2021-09-28 ENCOUNTER — Encounter (HOSPITAL_COMMUNITY)
Admission: RE | Disposition: A | Discharge status: Skilled Nursing Facility | Payer: Self-pay | Source: Ambulatory Visit | Attending: Neurological Surgery

## 2021-09-28 ENCOUNTER — Inpatient Hospital Stay (HOSPITAL_COMMUNITY)
Admission: RE | Admit: 2021-09-28 | Discharge: 2021-10-06 | DRG: 454 | Disposition: A | Discharge status: Skilled Nursing Facility | Payer: Medicare Other | Source: Ambulatory Visit | Attending: Neurological Surgery | Admitting: Neurological Surgery

## 2021-09-28 DIAGNOSIS — Z981 Arthrodesis status: Secondary | ICD-10-CM | POA: Diagnosis not present

## 2021-09-28 DIAGNOSIS — M4804 Spinal stenosis, thoracic region: Secondary | ICD-10-CM | POA: Diagnosis present

## 2021-09-28 DIAGNOSIS — Z803 Family history of malignant neoplasm of breast: Secondary | ICD-10-CM

## 2021-09-28 DIAGNOSIS — Z79899 Other long term (current) drug therapy: Secondary | ICD-10-CM

## 2021-09-28 DIAGNOSIS — M4805 Spinal stenosis, thoracolumbar region: Secondary | ICD-10-CM

## 2021-09-28 DIAGNOSIS — M5125 Other intervertebral disc displacement, thoracolumbar region: Secondary | ICD-10-CM

## 2021-09-28 DIAGNOSIS — M4156 Other secondary scoliosis, lumbar region: Secondary | ICD-10-CM | POA: Diagnosis present

## 2021-09-28 DIAGNOSIS — Z823 Family history of stroke: Secondary | ICD-10-CM

## 2021-09-28 DIAGNOSIS — D509 Iron deficiency anemia, unspecified: Secondary | ICD-10-CM | POA: Diagnosis present

## 2021-09-28 DIAGNOSIS — Z800 Family history of malignant neoplasm of digestive organs: Secondary | ICD-10-CM

## 2021-09-28 DIAGNOSIS — M48062 Spinal stenosis, lumbar region with neurogenic claudication: Secondary | ICD-10-CM | POA: Diagnosis present

## 2021-09-28 DIAGNOSIS — M5105 Intervertebral disc disorders with myelopathy, thoracolumbar region: Secondary | ICD-10-CM | POA: Diagnosis present

## 2021-09-28 DIAGNOSIS — I10 Essential (primary) hypertension: Secondary | ICD-10-CM | POA: Diagnosis present

## 2021-09-28 DIAGNOSIS — E7800 Pure hypercholesterolemia, unspecified: Secondary | ICD-10-CM | POA: Diagnosis present

## 2021-09-28 DIAGNOSIS — M4725 Other spondylosis with radiculopathy, thoracolumbar region: Secondary | ICD-10-CM | POA: Diagnosis present

## 2021-09-28 DIAGNOSIS — R110 Nausea: Secondary | ICD-10-CM | POA: Diagnosis not present

## 2021-09-28 DIAGNOSIS — M1990 Unspecified osteoarthritis, unspecified site: Secondary | ICD-10-CM | POA: Diagnosis present

## 2021-09-28 DIAGNOSIS — E876 Hypokalemia: Secondary | ICD-10-CM | POA: Diagnosis not present

## 2021-09-28 DIAGNOSIS — Z96653 Presence of artificial knee joint, bilateral: Secondary | ICD-10-CM | POA: Diagnosis present

## 2021-09-28 DIAGNOSIS — Z833 Family history of diabetes mellitus: Secondary | ICD-10-CM

## 2021-09-28 DIAGNOSIS — M4715 Other spondylosis with myelopathy, thoracolumbar region: Secondary | ICD-10-CM | POA: Diagnosis present

## 2021-09-28 DIAGNOSIS — Z791 Long term (current) use of non-steroidal anti-inflammatories (NSAID): Secondary | ICD-10-CM | POA: Diagnosis not present

## 2021-09-28 DIAGNOSIS — K5900 Constipation, unspecified: Secondary | ICD-10-CM | POA: Diagnosis not present

## 2021-09-28 DIAGNOSIS — M549 Dorsalgia, unspecified: Secondary | ICD-10-CM | POA: Diagnosis present

## 2021-09-28 HISTORY — PX: APPLICATION OF ROBOTIC ASSISTANCE FOR SPINAL PROCEDURE: SHX6753

## 2021-09-28 SURGERY — POSTERIOR LUMBAR FUSION 1 LEVEL
Anesthesia: General | Site: Back

## 2021-09-28 MED ORDER — LACTATED RINGERS IV SOLN: INTRAVENOUS | Status: DC

## 2021-09-28 MED ORDER — LIDOCAINE 2% (20 MG/ML) 5 ML SYRINGE
INTRAMUSCULAR | Status: AC
  Filled 2021-09-28: qty 5

## 2021-09-28 MED ORDER — LIDOCAINE-EPINEPHRINE 1 %-1:100000 IJ SOLN
INTRAMUSCULAR | Status: AC
  Filled 2021-09-28: qty 1

## 2021-09-28 MED ORDER — MORPHINE SULFATE (PF) 2 MG/ML IV SOLN: 2.0000 mg | INTRAVENOUS | Status: DC | PRN

## 2021-09-28 MED ORDER — HYDROCODONE-ACETAMINOPHEN 5-325 MG PO TABS
1.0000 | ORAL_TABLET | ORAL | Status: DC | PRN
  Administered 2021-09-29 (×2): 1 via ORAL
  Administered 2021-09-30 (×3): 2 via ORAL
  Administered 2021-10-01 (×2): 1 via ORAL
  Administered 2021-10-04 – 2021-10-06 (×5): 2 via ORAL
  Filled 2021-09-28: qty 2
  Filled 2021-09-28: qty 1
  Filled 2021-09-28: qty 2
  Filled 2021-09-28: qty 1
  Filled 2021-09-28 (×4): qty 2
  Filled 2021-09-28 (×2): qty 1
  Filled 2021-09-28 (×3): qty 2

## 2021-09-28 MED ORDER — ONDANSETRON HCL 4 MG/2ML IJ SOLN
INTRAMUSCULAR | Status: AC
  Filled 2021-09-28: qty 2

## 2021-09-28 MED ORDER — SENNA 8.6 MG PO TABS
1.0000 | ORAL_TABLET | Freq: Two times a day (BID) | ORAL | Status: DC
  Administered 2021-09-28 – 2021-10-01 (×6): 8.6 mg via ORAL
  Filled 2021-09-28 (×6): qty 1

## 2021-09-28 MED ORDER — FENTANYL CITRATE (PF) 250 MCG/5ML IJ SOLN
INTRAMUSCULAR | Status: AC
  Filled 2021-09-28: qty 5

## 2021-09-28 MED ORDER — BISACODYL 10 MG RE SUPP: 10.0000 mg | Freq: Every day | RECTAL | Status: DC | PRN

## 2021-09-28 MED ORDER — ACETAMINOPHEN 325 MG PO TABS
650.0000 mg | ORAL_TABLET | ORAL | Status: DC | PRN
  Administered 2021-09-29 – 2021-10-04 (×3): 650 mg via ORAL
  Filled 2021-09-28 (×3): qty 2

## 2021-09-28 MED ORDER — CEFAZOLIN SODIUM 1 G IJ SOLR
INTRAMUSCULAR | Status: AC
  Filled 2021-09-28: qty 20

## 2021-09-28 MED ORDER — HYDROMORPHONE HCL 1 MG/ML IJ SOLN
INTRAMUSCULAR | Status: AC
  Filled 2021-09-28: qty 0.5

## 2021-09-28 MED ORDER — 0.9 % SODIUM CHLORIDE (POUR BTL) OPTIME
TOPICAL | Status: DC | PRN
  Administered 2021-09-28 (×2): 1000 mL

## 2021-09-28 MED ORDER — THROMBIN 20000 UNITS EX SOLR
CUTANEOUS | Status: AC
  Filled 2021-09-28: qty 20000

## 2021-09-28 MED ORDER — METHOCARBAMOL 1000 MG/10ML IJ SOLN
500.0000 mg | Freq: Four times a day (QID) | INTRAVENOUS | Status: DC | PRN
  Filled 2021-09-28: qty 5

## 2021-09-28 MED ORDER — CEFAZOLIN SODIUM-DEXTROSE 2-4 GM/100ML-% IV SOLN
2.0000 g | Freq: Three times a day (TID) | INTRAVENOUS | Status: AC
  Administered 2021-09-28 – 2021-10-03 (×15): 2 g via INTRAVENOUS
  Filled 2021-09-28 (×15): qty 100

## 2021-09-28 MED ORDER — FENTANYL CITRATE (PF) 100 MCG/2ML IJ SOLN
25.0000 ug | INTRAMUSCULAR | Status: DC | PRN
  Administered 2021-09-28: 25 ug via INTRAVENOUS

## 2021-09-28 MED ORDER — METHOCARBAMOL 500 MG PO TABS
500.0000 mg | ORAL_TABLET | Freq: Four times a day (QID) | ORAL | Status: DC | PRN
  Administered 2021-09-29 – 2021-10-04 (×2): 500 mg via ORAL
  Filled 2021-09-28: qty 1

## 2021-09-28 MED ORDER — BUPIVACAINE HCL (PF) 0.5 % IJ SOLN
INTRAMUSCULAR | Status: DC | PRN
  Administered 2021-09-28: 12.5 mL
  Administered 2021-09-28: 5 mL

## 2021-09-28 MED ORDER — LIDOCAINE 2% (20 MG/ML) 5 ML SYRINGE
INTRAMUSCULAR | Status: DC | PRN
  Administered 2021-09-28: 60 mg via INTRAVENOUS

## 2021-09-28 MED ORDER — SUGAMMADEX SODIUM 200 MG/2ML IV SOLN
INTRAVENOUS | Status: DC | PRN
  Administered 2021-09-28: 200 mg via INTRAVENOUS

## 2021-09-28 MED ORDER — CHLORHEXIDINE GLUCONATE CLOTH 2 % EX PADS: 6.0000 | MEDICATED_PAD | Freq: Once | CUTANEOUS | Status: DC

## 2021-09-28 MED ORDER — ROCURONIUM BROMIDE 10 MG/ML (PF) SYRINGE
PREFILLED_SYRINGE | INTRAVENOUS | Status: AC
  Filled 2021-09-28: qty 10

## 2021-09-28 MED ORDER — PHENYLEPHRINE HCL-NACL 20-0.9 MG/250ML-% IV SOLN
INTRAVENOUS | Status: DC | PRN
  Administered 2021-09-28: 40 ug/min via INTRAVENOUS

## 2021-09-28 MED ORDER — THROMBIN 5000 UNITS EX SOLR
CUTANEOUS | Status: AC
  Filled 2021-09-28: qty 5000

## 2021-09-28 MED ORDER — PROPOFOL 10 MG/ML IV BOLUS
INTRAVENOUS | Status: AC
  Filled 2021-09-28: qty 20

## 2021-09-28 MED ORDER — FENTANYL CITRATE (PF) 250 MCG/5ML IJ SOLN
INTRAMUSCULAR | Status: DC | PRN
  Administered 2021-09-28: 100 ug via INTRAVENOUS
  Administered 2021-09-28 (×3): 50 ug via INTRAVENOUS

## 2021-09-28 MED ORDER — KETAMINE HCL 10 MG/ML IJ SOLN
INTRAMUSCULAR | Status: DC | PRN
  Administered 2021-09-28 (×2): 10 mg via INTRAVENOUS
  Administered 2021-09-28: 40 mg via INTRAVENOUS
  Administered 2021-09-28 (×2): 10 mg via INTRAVENOUS

## 2021-09-28 MED ORDER — ROCURONIUM BROMIDE 10 MG/ML (PF) SYRINGE
PREFILLED_SYRINGE | INTRAVENOUS | Status: DC | PRN
  Administered 2021-09-28: 80 mg via INTRAVENOUS
  Administered 2021-09-28 (×4): 20 mg via INTRAVENOUS

## 2021-09-28 MED ORDER — LIDOCAINE-EPINEPHRINE 1 %-1:100000 IJ SOLN
INTRAMUSCULAR | Status: DC | PRN
  Administered 2021-09-28: 12.5 mL
  Administered 2021-09-28: 5 mL

## 2021-09-28 MED ORDER — AMLODIPINE BESYLATE 5 MG PO TABS
5.0000 mg | ORAL_TABLET | Freq: Every day | ORAL | Status: DC
Start: 2021-09-28 — End: 2021-10-07
  Administered 2021-09-28 – 2021-10-05 (×8): 5 mg via ORAL
  Filled 2021-09-28 (×8): qty 1

## 2021-09-28 MED ORDER — MENTHOL 3 MG MT LOZG: 1.0000 | LOZENGE | OROMUCOSAL | Status: DC | PRN

## 2021-09-28 MED ORDER — CEFAZOLIN SODIUM-DEXTROSE 2-4 GM/100ML-% IV SOLN
2.0000 g | INTRAVENOUS | Status: AC
  Administered 2021-09-28 (×3): 2 g via INTRAVENOUS
  Filled 2021-09-28: qty 100

## 2021-09-28 MED ORDER — ONDANSETRON HCL 4 MG PO TABS: 4.0000 mg | ORAL_TABLET | Freq: Four times a day (QID) | ORAL | Status: DC | PRN

## 2021-09-28 MED ORDER — GELATIN ABSORBABLE MT POWD
OROMUCOSAL | Status: DC | PRN
  Administered 2021-09-28: 5 mL via TOPICAL

## 2021-09-28 MED ORDER — SODIUM CHLORIDE 0.9% FLUSH: 3.0000 mL | INTRAVENOUS | Status: DC | PRN

## 2021-09-28 MED ORDER — ACETAMINOPHEN 650 MG RE SUPP
650.0000 mg | RECTAL | Status: DC | PRN
Start: 2021-09-28 — End: 2021-10-07

## 2021-09-28 MED ORDER — SODIUM CHLORIDE 0.9% FLUSH
3.0000 mL | Freq: Two times a day (BID) | INTRAVENOUS | Status: DC
  Administered 2021-09-28 – 2021-10-06 (×15): 3 mL via INTRAVENOUS

## 2021-09-28 MED ORDER — PHENYLEPHRINE 80 MCG/ML (10ML) SYRINGE FOR IV PUSH (FOR BLOOD PRESSURE SUPPORT)
PREFILLED_SYRINGE | INTRAVENOUS | Status: AC
  Filled 2021-09-28: qty 10

## 2021-09-28 MED ORDER — DONEPEZIL HCL 10 MG PO TABS
5.0000 mg | ORAL_TABLET | Freq: Every day | ORAL | Status: DC
  Administered 2021-09-28 – 2021-10-05 (×8): 5 mg via ORAL
  Filled 2021-09-28 (×8): qty 1

## 2021-09-28 MED ORDER — SODIUM CHLORIDE 0.9 % IV SOLN: 250.0000 mL | INTRAVENOUS | Status: DC

## 2021-09-28 MED ORDER — ARTIFICIAL TEARS OPHTHALMIC OINT
TOPICAL_OINTMENT | OPHTHALMIC | Status: DC | PRN
  Administered 2021-09-28: 1 via OPHTHALMIC

## 2021-09-28 MED ORDER — PROGESTERONE MICRONIZED 100 MG PO CAPS
100.0000 mg | ORAL_CAPSULE | Freq: Every day | ORAL | Status: DC
  Administered 2021-09-28 – 2021-10-05 (×8): 100 mg via ORAL
  Filled 2021-09-28 (×9): qty 1

## 2021-09-28 MED ORDER — ESTRADIOL 1 MG PO TABS
0.5000 mg | ORAL_TABLET | Freq: Every day | ORAL | Status: DC
  Administered 2021-09-29 – 2021-10-06 (×8): 0.5 mg via ORAL
  Filled 2021-09-28 (×8): qty 0.5

## 2021-09-28 MED ORDER — ONDANSETRON HCL 4 MG/2ML IJ SOLN
INTRAMUSCULAR | Status: DC | PRN
  Administered 2021-09-28: 4 mg via INTRAVENOUS

## 2021-09-28 MED ORDER — PHENYLEPHRINE 80 MCG/ML (10ML) SYRINGE FOR IV PUSH (FOR BLOOD PRESSURE SUPPORT)
PREFILLED_SYRINGE | INTRAVENOUS | Status: DC | PRN
Start: 2021-09-28 — End: 2021-09-28
  Administered 2021-09-28: 80 ug via INTRAVENOUS
  Administered 2021-09-28: 160 ug via INTRAVENOUS

## 2021-09-28 MED ORDER — PROPOFOL 10 MG/ML IV BOLUS
INTRAVENOUS | Status: DC | PRN
  Administered 2021-09-28: 100 mg via INTRAVENOUS

## 2021-09-28 MED ORDER — PRAVASTATIN SODIUM 10 MG PO TABS
20.0000 mg | ORAL_TABLET | Freq: Every day | ORAL | Status: DC
Start: 2021-09-28 — End: 2021-10-07
  Administered 2021-09-28 – 2021-10-05 (×8): 20 mg via ORAL
  Filled 2021-09-28 (×8): qty 2

## 2021-09-28 MED ORDER — METHOCARBAMOL 500 MG PO TABS
500.0000 mg | ORAL_TABLET | Freq: Four times a day (QID) | ORAL | Status: DC | PRN
  Filled 2021-09-28: qty 1

## 2021-09-28 MED ORDER — ESMOLOL HCL 100 MG/10ML IV SOLN
INTRAVENOUS | Status: DC | PRN
  Administered 2021-09-28: 50 mg via INTRAVENOUS

## 2021-09-28 MED ORDER — ENALAPRIL MALEATE 5 MG PO TABS
20.0000 mg | ORAL_TABLET | Freq: Two times a day (BID) | ORAL | Status: DC
  Administered 2021-09-28 – 2021-10-06 (×16): 20 mg via ORAL
  Filled 2021-09-28 (×9): qty 4
  Filled 2021-09-28: qty 1
  Filled 2021-09-28 (×9): qty 4

## 2021-09-28 MED ORDER — LACTATED RINGERS IV SOLN: INTRAVENOUS | Status: DC | PRN

## 2021-09-28 MED ORDER — KETAMINE HCL 50 MG/5ML IJ SOSY
PREFILLED_SYRINGE | INTRAMUSCULAR | Status: AC
  Filled 2021-09-28: qty 10

## 2021-09-28 MED ORDER — KETOROLAC TROMETHAMINE 15 MG/ML IJ SOLN
7.5000 mg | Freq: Four times a day (QID) | INTRAMUSCULAR | Status: AC
  Administered 2021-09-28 – 2021-09-29 (×4): 7.5 mg via INTRAVENOUS
  Filled 2021-09-28 (×4): qty 1

## 2021-09-28 MED ORDER — DOCUSATE SODIUM 100 MG PO CAPS
100.0000 mg | ORAL_CAPSULE | Freq: Two times a day (BID) | ORAL | Status: DC
  Administered 2021-09-28 – 2021-10-01 (×6): 100 mg via ORAL
  Filled 2021-09-28 (×6): qty 1

## 2021-09-28 MED ORDER — ARTIFICIAL TEARS OPHTHALMIC OINT
TOPICAL_OINTMENT | OPHTHALMIC | Status: AC
  Filled 2021-09-28: qty 3.5

## 2021-09-28 MED ORDER — ORAL CARE MOUTH RINSE: 15.0000 mL | Freq: Once | OROMUCOSAL | Status: AC

## 2021-09-28 MED ORDER — PHENOL 1.4 % MT LIQD: 1.0000 | OROMUCOSAL | Status: DC | PRN

## 2021-09-28 MED ORDER — BUPIVACAINE HCL (PF) 0.5 % IJ SOLN
INTRAMUSCULAR | Status: AC
  Filled 2021-09-28: qty 30

## 2021-09-28 MED ORDER — ONDANSETRON HCL 4 MG/2ML IJ SOLN
4.0000 mg | Freq: Four times a day (QID) | INTRAMUSCULAR | Status: DC | PRN
  Administered 2021-09-28 – 2021-10-01 (×5): 4 mg via INTRAVENOUS
  Filled 2021-09-28 (×5): qty 2

## 2021-09-28 MED ORDER — CHLORHEXIDINE GLUCONATE 0.12 % MT SOLN
15.0000 mL | Freq: Once | OROMUCOSAL | Status: AC
  Administered 2021-09-28: 15 mL via OROMUCOSAL
  Filled 2021-09-28: qty 15

## 2021-09-28 MED ORDER — FLEET ENEMA 7-19 GM/118ML RE ENEM: 1.0000 | ENEMA | Freq: Once | RECTAL | Status: DC | PRN

## 2021-09-28 MED ORDER — DEXAMETHASONE SODIUM PHOSPHATE 10 MG/ML IJ SOLN
INTRAMUSCULAR | Status: DC | PRN
Start: 2021-09-28 — End: 2021-09-28
  Administered 2021-09-28: 10 mg via INTRAVENOUS

## 2021-09-28 MED ORDER — POLYETHYLENE GLYCOL 3350 17 G PO PACK: 17.0000 g | PACK | Freq: Every day | ORAL | Status: DC | PRN

## 2021-09-28 MED ORDER — GABAPENTIN 300 MG PO CAPS: 300.0000 mg | ORAL_CAPSULE | Freq: Every day | ORAL | Status: DC | PRN

## 2021-09-28 MED ORDER — SERTRALINE HCL 50 MG PO TABS
50.0000 mg | ORAL_TABLET | Freq: Every day | ORAL | Status: DC
  Administered 2021-09-29 – 2021-10-06 (×8): 50 mg via ORAL
  Filled 2021-09-28 (×8): qty 1

## 2021-09-28 MED ORDER — ALUM & MAG HYDROXIDE-SIMETH 200-200-20 MG/5ML PO SUSP: 30.0000 mL | Freq: Four times a day (QID) | ORAL | Status: DC | PRN

## 2021-09-28 MED ORDER — FENTANYL CITRATE (PF) 100 MCG/2ML IJ SOLN
INTRAMUSCULAR | Status: AC
  Filled 2021-09-28: qty 2

## 2021-09-28 MED ORDER — ACETAMINOPHEN 500 MG PO TABS
1000.0000 mg | ORAL_TABLET | Freq: Once | ORAL | Status: AC
  Administered 2021-09-28: 1000 mg via ORAL
  Filled 2021-09-28: qty 2

## 2021-09-28 SURGICAL SUPPLY — 94 items
BAG COUNTER SPONGE SURGICOUNT (BAG) ×6 IMPLANT
BASKET BONE COLLECTION (BASKET) ×3 IMPLANT
BIT DRILL LONG 3.0X30 (BIT) ×1 IMPLANT
BIT DRILL LONG 3X80 (BIT) IMPLANT
BIT DRILL LONG 4X80 (BIT) IMPLANT
BIT DRILL SHORT 3.0X30 (BIT) IMPLANT
BIT DRILL SHORT 3X80 (BIT) IMPLANT
BLADE BONE MILL MEDIUM (MISCELLANEOUS) ×1 IMPLANT
BLADE CLIPPER SURG (BLADE) IMPLANT
BLADE SURG 11 STRL SS (BLADE) ×3 IMPLANT
BONE CANC CHIPS 40CC CAN1/2 (Bone Implant) ×3 IMPLANT
BUR MATCHSTICK NEURO 3.0 LAGG (BURR) ×3 IMPLANT
CAGE COROENT MP 8X23 (Cage) ×2 IMPLANT
CANISTER SUCT 3000ML PPV (MISCELLANEOUS) ×3 IMPLANT
CEMENT KYPHON C01A KIT/MIXER (Cement) ×1 IMPLANT
CHIPS CANC BONE 40CC CAN1/2 (Bone Implant) ×2 IMPLANT
CNTNR URN SCR LID CUP LEK RST (MISCELLANEOUS) ×2 IMPLANT
CONT SPEC 4OZ STRL OR WHT (MISCELLANEOUS) ×3
COVER BACK TABLE 60X90IN (DRAPES) ×3 IMPLANT
DECANTER SPIKE VIAL GLASS SM (MISCELLANEOUS) ×2 IMPLANT
DERMABOND ADVANCED (GAUZE/BANDAGES/DRESSINGS) ×1
DERMABOND ADVANCED .7 DNX12 (GAUZE/BANDAGES/DRESSINGS) ×2 IMPLANT
DEVICE DISSECT PLASMABLAD 3.0S (MISCELLANEOUS) ×2 IMPLANT
DRAPE C-ARM 42X72 X-RAY (DRAPES) ×6 IMPLANT
DRAPE C-ARMOR (DRAPES) ×1 IMPLANT
DRAPE HALF SHEET 40X57 (DRAPES) ×1 IMPLANT
DRAPE LAPAROTOMY 100X72X124 (DRAPES) ×3 IMPLANT
DRAPE SHEET LG 3/4 BI-LAMINATE (DRAPES) ×3 IMPLANT
DRAPE WARM FLUID 44X44 (DRAPES) ×1 IMPLANT
DRSG OPSITE POSTOP 4X10 (GAUZE/BANDAGES/DRESSINGS) ×1 IMPLANT
DURAPREP 26ML APPLICATOR (WOUND CARE) ×3 IMPLANT
DURASEAL APPLICATOR TIP (TIP) IMPLANT
DURASEAL SPINE SEALANT 3ML (MISCELLANEOUS) IMPLANT
ELECT BLADE 4.0 EZ CLEAN MEGAD (MISCELLANEOUS)
ELECT REM PT RETURN 9FT ADLT (ELECTROSURGICAL) ×3
ELECTRODE BLDE 4.0 EZ CLN MEGD (MISCELLANEOUS) IMPLANT
ELECTRODE REM PT RTRN 9FT ADLT (ELECTROSURGICAL) ×2 IMPLANT
GAUZE 4X4 16PLY ~~LOC~~+RFID DBL (SPONGE) IMPLANT
GAUZE SPONGE 4X4 12PLY STRL (GAUZE/BANDAGES/DRESSINGS) ×2 IMPLANT
GLOVE BIOGEL PI IND STRL 6.5 (GLOVE) IMPLANT
GLOVE BIOGEL PI IND STRL 8.5 (GLOVE) ×4 IMPLANT
GLOVE BIOGEL PI INDICATOR 6.5 (GLOVE) ×1
GLOVE BIOGEL PI INDICATOR 8.5 (GLOVE) ×2
GLOVE ECLIPSE 7.0 STRL STRAW (GLOVE) ×1 IMPLANT
GLOVE ECLIPSE 8.5 STRL (GLOVE) ×6 IMPLANT
GLOVE SURG SS PI 6.5 STRL IVOR (GLOVE) ×7 IMPLANT
GOWN STRL REUS W/ TWL LRG LVL3 (GOWN DISPOSABLE) IMPLANT
GOWN STRL REUS W/ TWL XL LVL3 (GOWN DISPOSABLE) IMPLANT
GOWN STRL REUS W/TWL 2XL LVL3 (GOWN DISPOSABLE) ×6 IMPLANT
GOWN STRL REUS W/TWL LRG LVL3 (GOWN DISPOSABLE) ×8
GOWN STRL REUS W/TWL XL LVL3 (GOWN DISPOSABLE)
GRAFT BNE CHIP CANC 1-8 40 (Bone Implant) IMPLANT
GRAFT BONE PROTEIOS XL 10CC (Orthopedic Implant) ×1 IMPLANT
GUIDEWIRE NITINOL BEVEL TIP (WIRE) ×8 IMPLANT
HEMOSTAT POWDER KIT SURGIFOAM (HEMOSTASIS) ×1 IMPLANT
KIT BASIN OR (CUSTOM PROCEDURE TRAY) ×3 IMPLANT
KIT GRAFTMAG DEL NEURO DISP (NEUROSURGERY SUPPLIES) IMPLANT
KIT SPINE MAZOR X ROBO DISP (MISCELLANEOUS) ×3 IMPLANT
KIT TURNOVER KIT B (KITS) ×3 IMPLANT
MILL MEDIUM DISP (BLADE) ×2 IMPLANT
NDL RELINE-OR FENS 16 (NEEDLE) IMPLANT
NEEDLE HYPO 22GX1.5 SAFETY (NEEDLE) ×3 IMPLANT
NEEDLE RELINE-OR FENS 16 (NEEDLE) ×18 IMPLANT
NS IRRIG 1000ML POUR BTL (IV SOLUTION) ×4 IMPLANT
PACK LAMINECTOMY NEURO (CUSTOM PROCEDURE TRAY) ×3 IMPLANT
PAD ARMBOARD 7.5X6 YLW CONV (MISCELLANEOUS) ×9 IMPLANT
PATTIES SURGICAL .5 X1 (DISPOSABLE) ×3 IMPLANT
PIN HEAD 2.5X60MM (PIN) IMPLANT
PLASMABLADE 3.0S (MISCELLANEOUS) ×3
PUSHER RELINE FENS 36 OR (MISCELLANEOUS) ×6 IMPLANT
ROD RELINE 0-0 CON M 5.0/6.0MM (Rod) ×2 IMPLANT
ROD RELINE-O 5.5X300 STRT NS (Rod) IMPLANT
ROD RELINE-O 5.5X300MM STRT (Rod) ×4 IMPLANT
SCREW LOCK RELINE 5.5 TULIP (Screw) ×12 IMPLANT
SCREW RELINE PA 2FC 5.5X45 (Screw) ×4 IMPLANT
SCREW RELINE PA 6.5X45 (Screw) ×4 IMPLANT
SCREW SCHANZ SA 4.0MM (MISCELLANEOUS) IMPLANT
SPONGE SURGIFOAM ABS GEL 100 (HEMOSTASIS) ×2 IMPLANT
SPONGE T-LAP 4X18 ~~LOC~~+RFID (SPONGE) ×1 IMPLANT
SUT PROLENE 6 0 BV (SUTURE) IMPLANT
SUT VIC AB 1 CT1 18XBRD ANBCTR (SUTURE) ×2 IMPLANT
SUT VIC AB 1 CT1 8-18 (SUTURE) ×6
SUT VIC AB 2-0 CP2 18 (SUTURE) ×4 IMPLANT
SUT VIC AB 3-0 SH 8-18 (SUTURE) ×4 IMPLANT
SUT VIC AB 4-0 RB1 18 (SUTURE) ×6 IMPLANT
SYR 3ML LL SCALE MARK (SYRINGE) ×12 IMPLANT
TEMPLATE ROD SILICON 250 (ORTHOPEDIC DISPOSABLE SUPPLIES) ×1 IMPLANT
TIP FENS REPLACE RELINE (MISCELLANEOUS) ×6 IMPLANT
TOWEL GREEN STERILE (TOWEL DISPOSABLE) ×3 IMPLANT
TOWEL GREEN STERILE FF (TOWEL DISPOSABLE) ×3 IMPLANT
TRAY FOLEY MTR SLVR 14FR STAT (SET/KITS/TRAYS/PACK) ×1 IMPLANT
TRAY FOLEY MTR SLVR 16FR STAT (SET/KITS/TRAYS/PACK) ×2 IMPLANT
TUBE MAZOR SA REDUCTION (TUBING) ×3 IMPLANT
WATER STERILE IRR 1000ML POUR (IV SOLUTION) ×3 IMPLANT

## 2021-09-28 NOTE — Progress Notes (Signed)
Orthopedic Tech Progress Note ?Patient Details:  ?[name] ?November 10, 1940 ?[id number] ? ?Ortho Devices ?Type of Ortho Device: Lumbar corsett ?Ortho Device/Splint Location: BACK ?Ortho Device/Splint Interventions: Ordered ?  ?Post Interventions ?Patient Tolerated: Well ?Instructions Provided: Care of device ? ?Donald Pore ?09/28/2021, 2:37 PM ? ?

## 2021-09-28 NOTE — Transfer of Care (Signed)
Immediate Anesthesia Transfer of Care Note ? ?Patient: MERCED BROUGHAM ? ?Procedure(s) Performed: Thoracic twelve -Lumbar one Posterior Lumbar Interbody Fusion, Posterior Arthrodesis Pedicle fixation Thoracic ten-Lumbar three with augmented pedicle screws and mazor (Back) ?APPLICATION OF ROBOTIC ASSISTANCE FOR SPINAL PROCEDURE ? ?Patient Location: PACU ? ?Anesthesia Type:General ? ?Level of Consciousness: drowsy ? ?Airway & Oxygen Therapy: Patient Spontanous Breathing and Patient connected to face mask ? ?Post-op Assessment: Report given to RN, Post -op Vital signs reviewed and stable and Patient moving all extremities X 4 ? ?Post vital signs: Reviewed and stable ? ?Last Vitals:  ?Vitals Value Taken Time  ?BP 164/85 09/28/21 1338  ?Temp    ?Pulse 69 09/28/21 1342  ?Resp 17 09/28/21 1342  ?SpO2 100 % 09/28/21 1342  ?Vitals shown include unvalidated device data. ? ?Last Pain:  ?Vitals:  ? 09/28/21 0628  ?TempSrc:   ?PainSc: 0-No pain  ?   ? ?  ? ?Complications: No notable events documented. ?

## 2021-09-28 NOTE — Progress Notes (Signed)
Pt admitted to 4NP 12 with 1 bag of belongings. A&Ox4. Denies pain at this time. Pt oriented to unit. Assessment and vital signs documented. Call bell within reach, bed alarm on, bed in lowest position.  ?

## 2021-09-28 NOTE — Op Note (Signed)
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  History of fusion L1-S1. ?Postoperative diagnosis: Same ?Procedure: Posterior decompression and discectomy of T12-L1 with more work than required for simple interbody technique.  Posterior lumbar interbody fusion with bilateral peek spacers local autograft allograft and Proteus.  Segmental fixation from T10 to previous fixation from L2-S1 with posterolateral arthrodesis using local autograft allograft and Proteus from T10-L2.  Robotic assistance for placement of pedicle screws, fluoroscopic guidance.  Methacrylate augmentation at T10-T11 and T12. ?Surgeon: Barnett Abu ?First Assistant: Lisbeth Renshaw MD ?Anesthesia: General endotracheal ?Indications: [name] is an 81 year old individual who has had significant progressive weakness in her lower extremities with extreme back pain she has had a previous decompression and fusion from L1-S1.  This was done in a number of stages a recent MRI of her thoracic spine demonstrates the presence of a large centrally herniated disc at the T12-L1 level with severe central canal stenosis.  She was advised regarding the need for surgery to decompress and stabilize T12-L1 but because it is at the thoracolumbar junction she was advised that fixation should go to the level of T10. ? ?Procedure: Patient was brought to the operating room supine on the stretcher.  After the smooth induction of general endotracheal anesthesia, she was carefully turned prone.  The bony prominences were appropriately padded and protected.  The back was prepped with alcohol DuraPrep and draped in a sterile fashion.  A midline incision was created from the superior aspect of her previous surgery and dissection was carried down below the lumbodorsal fascia to expose the top of the hardware at the L2 level.  Then the incision was extended cephalad to expose the spinous processes of L1 T12 T11 and  T10.  The dissection was then taken out in a subperiosteal fashion to expose the interlaminar spaces at T10-11, T11-12, T12-L1, L1-L2.  The interval between the rod spaces at L2-L3 was cleared and using an osteotome to chip away some bone a W connector was then placed on the rod towards the medial aspect of the vertebrae.  This was done on both rods.  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  Next the robotic arm was attached to the patient using a spinous process clamp on L2.  Registration radiographs were obtained in AP and oblique projections and when good registration was obtained pedicle entry sites were chosen at T10-T11 and T12.  This was done after examining the program which had been created to place pedicle screws in the various vertebrae.  Next the robotic arm was used to place K wires in the pedicles of T10-T11 and T12 and L1.  Once the K wires were placed radiographs were checked to note the position of the K wires.  It was felt that the right L1 K wire was a bit lateral.  This was adjusted for when the pedicle screw was placed into L1 it was diverted slightly medially.  The K wire was removed early.  At L1 we placed 5.5 x 45 mm screws at T12 6.5 x 45 mm screws were placed at T11 6.5 x 45 mm screws were placed and at T10 we placed 5.5 x 45 mm screws.  No screws were placed in T9.  The bone was noted to be fairly soft at the T9-10 T11 and T12 vertebrae and for this reason we used methacrylate  augmentation with 1 cc of methacrylate being injected into each pedicle screw this was done with fluoroscopic guidance.  Once all the cement was placed and good placement of the screws was assured the robotic arm was disassembled. ? ?Then proceeded with a posterior lumbar interbody arthrodesis at the T12-L1 level.  The extraforaminal zone at T12-L1 on the right was first uncovered and here there  was noted to be significant bulging of the disc posteriorly it was incised and the discectomy was then undertaken with severely sclerotic endplates being noted at the T12-L1 level we worked from side to side with a minimal distraction that was available but ultimately were able to distract the interspace and obtain a good discectomy first a 6 mm shaver was used to open the space slightly and ultimately were able to use an 8 mm spacer in the disc space.  Once the endplates were completely shaved and the bulk of the disc ectomy was completed with good decompression of the central dura the interspace was then sized for an appropriate size spacer and was felt that 8 x 9 x 23 mm spacer with 4 degrees of lordosis would fit best to these spacers were filled with autograft allograft and Proteus and a total of 9 cc of Proteus was placed into the interspace along with the 2 cages.  Once the interspace was packed and completed attention was turned to placing the rods 18 cm straight rods were then used to contour the placement of the rod in a neutral construct from T10-L2.  The rods were then provisionally tightened and grafting was then undertaken in the intertransverse space between L1 and L2 the lateral space between T12 and L1 and then the posterior spaces from T10-T11 and T11-T12.  A total of 40 cc of morselized allograft along with the Proteus and remaining autograft was packed into these areas.  Once all the grafting had been performed in the system was tightened to the appropriate torque specks final radiographs were obtained in AP and lateral projections.  Good placement of the interbody spacers was noted.  With this the wound was checked for hemostasis and the thoracodorsal and lumbar dorsal fascia was closed with #1 Vicryl in interrupted fashion 2-0 Vicryl was used in the subcutaneous tissues 3-0 Vicryl subcuticularly along with some 4-0 Vicryl subcuticularly and the top layer Dermabond was placed on the skin blood  loss for the procedure was estimated 300 cc. ?

## 2021-09-28 NOTE — Anesthesia Procedure Notes (Signed)
Procedure Name: Intubation ?Date/Time: 09/28/2021 7:50 AM ?Performed by: Carolan Clines, CRNA ?Pre-anesthesia Checklist: Patient identified, Emergency Drugs available, Suction available and Patient being monitored ?Patient Re-evaluated:Patient Re-evaluated prior to induction ?Oxygen Delivery Method: Circle System Utilized ?Preoxygenation: Pre-oxygenation with 100% oxygen ?Induction Type: IV induction ?Ventilation: Mask ventilation without difficulty ?Laryngoscope Size: Glidescope and 3 ?Grade View: Grade I ?Tube type: Oral ?Tube size: 7.0 mm ?Number of attempts: 1 ?Airway Equipment and Method: Stylet and Oral airway ?Placement Confirmation: ETT inserted through vocal cords under direct vision, positive ETCO2 and breath sounds checked- equal and bilateral ?Secured at: 22 cm ?Tube secured with: Tape ?Dental Injury: Teeth and Oropharynx as per pre-operative assessment  ?Comments: Intubated by Everlene Other SRNA ? ? ? ? ?

## 2021-09-28 NOTE — OR Nursing (Signed)
[facility] BONE CEMENT USED DURING PROCEDURE LOT NUMBER [id number] ?

## 2021-09-28 NOTE — Anesthesia Postprocedure Evaluation (Signed)
Anesthesia Post Note ? ?Patient: [name] ? ?Procedure(s) Performed: Thoracic twelve -Lumbar one Posterior Lumbar Interbody Fusion, Posterior Arthrodesis Pedicle fixation Thoracic ten-Lumbar three with augmented pedicle screws and mazor (Back) ?APPLICATION OF ROBOTIC ASSISTANCE FOR SPINAL PROCEDURE ? ?  ? ?Patient location during evaluation: PACU ?Anesthesia Type: General ?Level of consciousness: awake and alert ?Pain management: pain level controlled ?Vital Signs Assessment: post-procedure vital signs reviewed and stable ?Respiratory status: spontaneous breathing, nonlabored ventilation, respiratory function stable and patient connected to nasal cannula oxygen ?Cardiovascular status: blood pressure returned to baseline and stable ?Postop Assessment: no apparent nausea or vomiting ?Anesthetic complications: no ? ? ?No notable events documented. ? ?Last Vitals:  ?Vitals:  ? 09/28/21 1440 09/28/21 1455  ?BP: (!) 141/70 134/62  ?Pulse: 73 71  ?Resp: 14 19  ?Temp:  36.4 ?C  ?SpO2: 100% 95%  ?  ?Last Pain:  ?Vitals:  ? 09/28/21 1440  ?TempSrc:   ?PainSc: 0-No pain  ? ? ?  ?  ?  ?  ?  ?  ? ?Toma Erichsen L Jerrion Tabbert ? ? ? ? ?

## 2021-09-28 NOTE — Progress Notes (Signed)
Patient ID: [name], female   DOB: [birth year], 81 y.o.   MRN: [id number] ?Vital signs are stable ?Motor function appears good ?She is still rather sleepy ?Complaining of some back pain ?We will use Toradol for pain control ?

## 2021-09-28 NOTE — H&P (Signed)
[name] is an 81 y.o. female.   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  She initially had lower lumbar decompression and fusion at L4-5 and then required subsequent fusion at L3-4 and L2-3 then ultimately had degeneration at L1-2 and underwent an anterolateral decompression with lateral plate fixation.  Since that time she has undergone degenerative changes at the T12-L1 level and after careful consideration of her options I advised that stabilization needs to be carried to the T9 level in order to affect stabilization across the thoracolumbar junction.  She is now admitted for that procedure.  In addition [name] has had decompression and fusion at the L5-S1 level with pedicular fixation she is having some prominence of pelvic screw on the left side and this had been removed.  Her fusion from L2 to the sacrum is stable.  The L1-L2 level has some question of pseudoarthrosis. ? ?Past Medical History:  ?Diagnosis Date  ? Anxiety   ? Arthritis   ? Bartholin's gland abscess 04/2017  ? Left, incised and drained  ? High cholesterol   ? History of iron deficiency anemia   ? Hypertension   ? ? ?Past Surgical History:  ?Procedure Laterality Date  ? ANTERIOR LAT LUMBAR FUSION N/A 09/03/2019  ? Procedure: Lumbar one-two Anterolateral lumbar interbody fusion with lateral plate;  Surgeon: Kristeen Miss, MD;  Location: [facility];  Service: Neurosurgery;  Laterality: N/A;  ? BACK SURGERY    ? BREAST EXCISIONAL BIOPSY Right   ? x 2  ? BUNIONECTOMY    ? CHOLECYSTECTOMY    ? COLONOSCOPY    ? DIAGNOSTIC LAPAROSCOPY    ? EYE SURGERY Bilateral   ? CATARACT SURGERY  ? HARDWARE REMOVAL Right 02/09/2021  ? Procedure: removal of right iliac crest screw with Met-rx;  Surgeon: Kristeen Miss, MD;  Location: [facility];  Service: Neurosurgery;  Laterality: Right;  ? LUMBAR FUSION   2011  ? OPEN SURGICAL REPAIR OF GLUTEAL TENDON Right 01/14/2019  ? Procedure: Right hip bursectomy; gluteal tendon repair;  Surgeon: Gaynelle Arabian, MD;  Location: [facility];  Service: Orthopedics;  Laterality: Right;  22mn  ? right knee replacement   2011  ? TONSILLECTOMY AND ADENOIDECTOMY    ? TOTAL KNEE ARTHROPLASTY Left 09/19/2016  ? Procedure: LEFT TOTAL KNEE ARTHROPLASTY;  Surgeon: FGaynelle Arabian MD;  Location: [facility];  Service: Orthopedics;  Laterality: Left;  requests 561ms ?with abductor block  ? ? ?Family History  ?Problem Relation Age of Onset  ? Stroke Mother   ? Cancer Father   ?     colon  ? Diabetes Father   ? Breast cancer Maternal Aunt   ? Breast cancer Maternal Grandmother   ? ?Social History:  reports that she has never smoked. She has never used smokeless tobacco. She reports that she does not drink alcohol and does not use drugs. ? ?Allergies: No Known Allergies ? ?Medications Prior to Admission  ?Medication Sig Dispense Refill  ? amLODipine (NORVASC) 5 MG tablet Take 5 mg by mouth at bedtime.    ? Cholecalciferol (VITAMIN D3) 50 MCG (2000 UT) capsule Take 2,000 Units by mouth daily.    ? donepezil (ARICEPT) 5 MG tablet Take 1 tablet by mouth at bedtime.    ? enalapril (VASOTEC) 20 MG tablet Take 20 mg by mouth 2 (two) times daily.    ?  estradiol (ESTRACE) 0.5 MG tablet Take one half tablet (0.25 mg) by mouth daily. (Patient taking differently: Take 0.5 mg by mouth daily.) 45 tablet 0  ? gabapentin (NEURONTIN) 300 MG capsule Take 300 mg by mouth daily as needed (Nerve pain).    ? HYDROcodone-acetaminophen (NORCO/VICODIN) 5-325 MG tablet Take 1 tablet by mouth daily as needed for moderate pain or severe pain.    ? meloxicam (MOBIC) 15 MG tablet Take 15 mg by mouth daily.    ? methocarbamol (ROBAXIN) 500 MG tablet Take 1 tablet (500 mg total) by mouth every 6 (six) hours as needed for muscle spasms. 40 tablet 3  ? pravastatin (PRAVACHOL) 20 MG tablet Take 20 mg by mouth at bedtime.    ? progesterone  (PROMETRIUM) 100 MG capsule Take 1 capsule (100 mg total) by mouth daily. (Patient taking differently: Take 100 mg by mouth at bedtime.) 90 capsule 0  ? sertraline (ZOLOFT) 50 MG tablet Take 50 mg by mouth daily.    ? ? ?Results for orders placed or performed during the hospital encounter of 09/27/21 (from the past 48 hour(s))  ?Basic metabolic panel per protocol     Status: Abnormal  ? Collection Time: 09/27/21  1:08 PM  ?Result Value Ref Range  ? Sodium 140 135 - 145 mmol/L  ? Potassium 3.5 3.5 - 5.1 mmol/L  ? Chloride 106 98 - 111 mmol/L  ? CO2 27 22 - 32 mmol/L  ? Glucose, Bld 102 (H) 70 - 99 mg/dL  ?  Comment: Glucose reference range applies only to samples taken after fasting for at least 8 hours.  ? BUN 9 8 - 23 mg/dL  ? Creatinine, Ser 0.72 0.44 - 1.00 mg/dL  ? Calcium 9.2 8.9 - 10.3 mg/dL  ? GFR, Estimated >60 >60 mL/min  ?  Comment: (NOTE) ?Calculated using the CKD-EPI Creatinine Equation (2021) ?  ? Anion gap 7 5 - 15  ?  Comment: Performed at [facility], [place] [address], [place], [place] [address]  ?CBC per protocol     Status: None  ? Collection Time: 09/27/21  1:08 PM  ?Result Value Ref Range  ? WBC 5.8 4.0 - 10.5 K/uL  ? RBC 4.19 3.87 - 5.11 MIL/uL  ? Hemoglobin 12.7 12.0 - 15.0 g/dL  ? HCT 38.5 36.0 - 46.0 %  ? MCV 91.9 80.0 - 100.0 fL  ? MCH 30.3 26.0 - 34.0 pg  ? MCHC 33.0 30.0 - 36.0 g/dL  ? RDW 13.4 11.5 - 15.5 %  ? Platelets 235 150 - 400 K/uL  ? nRBC 0.0 0.0 - 0.2 %  ?  Comment: Performed at [facility], [place] [address], [place], [place] [address]  ?Surgical pcr screen     Status: None  ? Collection Time: 09/27/21  1:08 PM  ? Specimen: Nasal Mucosa; Nasal Swab  ?Result Value Ref Range  ? MRSA, PCR NEGATIVE NEGATIVE  ? Staphylococcus aureus NEGATIVE NEGATIVE  ?  Comment: (NOTE) ?The Xpert SA Assay (FDA approved for NASAL specimens in patients 54 ?years of age and older), is one component of a comprehensive ?surveillance program. It is not intended to diagnose infection nor to ?guide  or monitor treatment. ?Performed at [facility], [place] [address], [place], [place] ?[address] ?  ?Type and screen [place]     Status: None  ? Collection Time: 09/27/21  1:16 PM  ?Result Value Ref Range  ? ABO/RH(D) O NEG   ?  Antibody Screen NEG   ? Sample Expiration 10/11/2021,2359   ? Extend sample reason    ?  NO TRANSFUSIONS OR PREGNANCY IN THE PAST 3 MONTHS ?Performed at [facility], [place] [address], [place], [place] [address] ?  ? ?No results found. ? ?Review of Systems  ?Musculoskeletal:  Positive for back pain and gait problem.  ?Neurological:  Positive for weakness.  ?All other systems reviewed and are negative. ? ?Blood pressure (!) 162/77, pulse 75, temperature 97.7 ?F (36.5 ?C), temperature source Oral, resp. rate 17, height '5\' 7"'  (1.702 m), weight 83.9 kg, last menstrual period 06/06/1996, SpO2 97 %. ?Physical Exam ?Constitutional:   ?   Appearance: Normal appearance.  ?HENT:  ?   Head: Normocephalic and atraumatic.  ?   Right Ear: Tympanic membrane normal.  ?   Left Ear: Tympanic membrane normal.  ?   Nose: Nose normal.  ?   Mouth/Throat:  ?   Mouth: Mucous membranes are moist.  ?Eyes:  ?   Extraocular Movements: Extraocular movements intact.  ?   Conjunctiva/sclera: Conjunctivae normal.  ?   Pupils: Pupils are equal, round, and reactive to light.  ?Cardiovascular:  ?   Rate and Rhythm: Normal rate and regular rhythm.  ?   Pulses: Normal pulses.  ?   Heart sounds: Normal heart sounds.  ?Pulmonary:  ?   Effort: Pulmonary effort is normal.  ?   Breath sounds: Normal breath sounds.  ?Abdominal:  ?   General: Abdomen is flat.  ?   Palpations: Abdomen is soft.  ?Musculoskeletal:  ?   Cervical back: Normal range of motion and neck supple.  ?   Comments: Positive straight leg raising at 30 degrees Patrick's maneuver is negative.  ?Skin: ?   General: Skin is warm and dry.  ?   Capillary Refill: Capillary refill takes less than 2 seconds.  ?Neurological:  ?   Mental Status: She  is alert.  ?   Comments: Alert and oriented moving all extremities well.  Walks with a slight forward stoop and pitches to the left.  Is using a cane.  Absent reflexes in the patella and the Achilles.  S

## 2021-09-28 NOTE — Evaluation (Signed)
Physical Therapy Evaluation ?Patient Details ?Name: Kristen MontgomeryMartha E Rund ?MRN: [id number] ?DOB: 04/20/1941 ?Today's Date: 09/28/2021 ? ?History of Present Illness ? Pt is an 81 y.o. 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 PMH includes 3x prior back sxs (most recently 08/2019), bilateral TKA (2011, 2018), HTN, arthritis, anxiety. ?  ?Clinical Impression ? Pt presents with an overall decrease in functional mobility secondary to above. PTA, pt reports mod indep with SPC, lives alone; pt not forthcoming with assist available upon return home, but reports, "I have people I can ask for help." Initiated educ re: precautions, positioning, brace wear, activity recommendations and importance of mobility. Today, pt limited by nausea/vomiting once sitting EOB, unable to further progress mobility secondary to this. Pt would benefit from continued acute PT services to maximize functional mobility and independence prior to d/c with HHPT services pending progress.     ? ?Recommendations for follow up therapy are one component of a multi-disciplinary discharge planning process, led by the attending physician.  Recommendations may be updated based on patient status, additional functional criteria and insurance authorization. ? ?Follow Up Recommendations Home health PT ? ?  ?Assistance Recommended at Discharge Frequent or constant Supervision/Assistance  ?Patient can return home with the following ? A little help with walking and/or transfers;A little help with bathing/dressing/bathroom;Assistance with cooking/housework;Assist for transportation;Help with stairs or ramp for entrance ? ?  ?Equipment Recommendations None recommended by PT  ?Recommendations for Other Services ?    ?  ?Functional Status Assessment Patient has had a recent decline in their functional status and demonstrates the ability to make significant improvements in function in a  reasonable and predictable amount of time.  ? ?  ?Precautions / Restrictions Precautions ?Precautions: Back;Fall ?Precaution Booklet Issued: No ?Required Braces or Orthoses: Spinal Brace ?Spinal Brace: Lumbar corset;Applied in sitting position ?Restrictions ?Weight Bearing Restrictions: No  ? ?  ? ?Mobility ? Bed Mobility ?Overal bed mobility: Needs Assistance ?Bed Mobility: Rolling, Sidelying to Sit ?Rolling: Supervision ?Sidelying to sit: Mod assist, HOB elevated ?  ?  ?  ?General bed mobility comments: cues for log roll, pt prefers different technique ("I always get my legs OOB like this"), but then requiring modA for trunk elevation with increased time and effort; use of bed rail; return to supine with modA for BLE management, pt requesting to stay in sidelying (pillow placed between knees) ?  ? ?Transfers ?  ?  ?  ?  ?  ?  ?  ?  ?  ?General transfer comment: unable to progress to standing secondary to nausea/vomiting sitting EOB ?  ? ?Ambulation/Gait ?  ?  ?  ?  ?  ?  ?  ?  ? ?Stairs ?  ?  ?  ?  ?  ? ?Wheelchair Mobility ?  ? ?Modified Rankin (Stroke Patients Only) ?  ? ?  ? ?Balance Overall balance assessment: Needs assistance ?  ?Sitting balance-Leahy Scale: Fair ?Sitting balance - Comments: prolonged sitting EOB without UE support (nausea/vomiting) ?  ?  ?  ?  ?  ?  ?  ?  ?  ?  ?  ?  ?  ?  ?  ?   ? ? ? ?Pertinent Vitals/Pain Pain Assessment ?Pain Assessment: Faces ?Faces Pain Scale: Hurts even more ?Pain Location: back ?Pain Descriptors / Indicators: Guarding, Grimacing ?Pain Intervention(s): RN gave pain meds during session, Monitored during session, Limited activity within patient's tolerance  ? ? ?  Home Living Family/patient expects to be discharged to:: Private residence ?Living Arrangements: Alone ?Available Help at Discharge: Friend(s);Available PRN/intermittently ?Type of Home: House ?Home Access: Level entry ?  ?  ?  ?Home Layout: One level ?Home Equipment: Agricultural consultant (2 wheels);Cane - single  point;Shower seat ?Additional Comments: pt reports "I can call someone for help if needed..." not forthcoming with further details regarding assist available at d/c  ?  ?Prior Function Prior Level of Function : Independent/Modified Independent ?  ?  ?  ?  ?  ?  ?Mobility Comments: Mod indep ambulating with SPC ?  ?  ? ? ?Hand Dominance  ?   ? ?  ?Extremity/Trunk Assessment  ? Upper Extremity Assessment ?Upper Extremity Assessment: Overall WFL for tasks assessed ?  ? ?Lower Extremity Assessment ?Lower Extremity Assessment: Generalized weakness ?  ? ?Cervical / Trunk Assessment ?Cervical / Trunk Assessment: Back Surgery  ?Communication  ? Communication: Expressive difficulties (soft voice requiring cues to speak up)  ?Cognition Arousal/Alertness: Awake/alert, Lethargic, Suspect due to medications ?Behavior During Therapy: Flat affect ?Overall Cognitive Status: No family/caregiver present to determine baseline cognitive functioning ?  ?  ?  ?  ?  ?  ?  ?  ?  ?  ?  ?  ?  ?  ?  ?  ?General Comments: suspect current cognitive status related to post-op/medications/pain/nausea - pt answering questions appropriately with increased time, requiring cues to keep eyes open initially, more alert once seated EOB; suspect internally distracted by pain ?  ?  ? ?  ?General Comments General comments (skin integrity, edema, etc.): initiated post-op back education re: precautions (pt recalled 1/3 back precautions from prior sx), positioning, brace wear, activity recommendations - pt with limited teachback of education, likely secondary to pain/nausea/vomiting with mobility ? ?  ?Exercises    ? ?Assessment/Plan  ?  ?PT Assessment Patient needs continued PT services  ?PT Problem List Decreased strength;Decreased activity tolerance;Decreased balance;Decreased mobility;Decreased knowledge of use of DME;Decreased knowledge of precautions;Pain ? ?   ?  ?PT Treatment Interventions DME instruction;Gait training;Functional mobility  training;Therapeutic activities;Therapeutic exercise;Balance training;Patient/family education   ? ?PT Goals (Current goals can be found in the Care Plan section)  ?Acute Rehab PT Goals ?Patient Stated Goal: decreased pain, return home ?PT Goal Formulation: With patient ?Time For Goal Achievement: 10/12/21 ?Potential to Achieve Goals: Good ? ?  ?Frequency Min 5X/week ?  ? ? ?Co-evaluation   ?  ?  ?  ?  ? ? ?  ?AM-PAC PT "6 Clicks" Mobility  ?Outcome Measure Help needed turning from your back to your side while in a flat bed without using bedrails?: A Lot ?Help needed moving from lying on your back to sitting on the side of a flat bed without using bedrails?: A Lot ?Help needed moving to and from a bed to a chair (including a wheelchair)?: A Little ?Help needed standing up from a chair using your arms (e.g., wheelchair or bedside chair)?: A Little ?Help needed to walk in hospital room?: A Lot ?Help needed climbing 3-5 steps with a railing? : A Lot ?6 Click Score: 14 ? ?  ?End of Session   ?Activity Tolerance: Treatment limited secondary to medical complications (Comment) ?Patient left: in bed;with call bell/phone within reach;with bed alarm set;with nursing/sitter in room ?Nurse Communication: Mobility status;Patient requests pain meds;Other (comment) (anti-nausea meds) ?PT Visit Diagnosis: Other abnormalities of gait and mobility (R26.89);Pain ?  ? ?Time: 6213-0865 ?PT Time Calculation (min) (ACUTE ONLY): 24 min ? ? ?  Charges:   PT Evaluation ?$PT Eval Moderate Complexity: 1 Mod ?  ?  ?   ?Ina Homes, PT, DPT ?Acute Rehabilitation Services  ?Pager [phone] ?Office [phone] ? ?Malachy Chamber ?09/28/2021, 6:27 PM ? ?

## 2021-09-29 ENCOUNTER — Encounter (HOSPITAL_COMMUNITY): Payer: Self-pay | Admitting: Neurological Surgery

## 2021-09-29 LAB — CBC
HCT: 26.6 % — ABNORMAL LOW (ref 36.0–46.0)
Hemoglobin: 9.2 g/dL — ABNORMAL LOW (ref 12.0–15.0)
MCH: 31.1 pg (ref 26.0–34.0)
MCHC: 34.6 g/dL (ref 30.0–36.0)
MCV: 89.9 fL (ref 80.0–100.0)
Platelets: 196 10*3/uL (ref 150–400)
RBC: 2.96 MIL/uL — ABNORMAL LOW (ref 3.87–5.11)
RDW: 13.5 % (ref 11.5–15.5)
WBC: 12.6 10*3/uL — ABNORMAL HIGH (ref 4.0–10.5)
nRBC: 0 % (ref 0.0–0.2)

## 2021-09-29 LAB — BASIC METABOLIC PANEL
Anion gap: 6 (ref 5–15)
BUN: 10 mg/dL (ref 8–23)
CO2: 26 mmol/L (ref 22–32)
Calcium: 8.1 mg/dL — ABNORMAL LOW (ref 8.9–10.3)
Chloride: 105 mmol/L (ref 98–111)
Creatinine, Ser: 0.68 mg/dL (ref 0.44–1.00)
GFR, Estimated: >60 mL/min (ref >60)
Glucose, Bld: 125 mg/dL — ABNORMAL HIGH (ref 70–99)
Potassium: 3.1 mmol/L — ABNORMAL LOW (ref 3.5–5.1)
Sodium: 137 mmol/L (ref 135–145)

## 2021-09-29 MED ORDER — HYDROMORPHONE HCL 1 MG/ML IJ SOLN
0.5000 mg | INTRAMUSCULAR | Status: DC | PRN
  Administered 2021-09-29 – 2021-10-01 (×4): 1 mg via INTRAVENOUS
  Filled 2021-09-29 (×4): qty 1

## 2021-09-29 MED FILL — Heparin Sodium (Porcine) Inj 1000 Unit/ML: INTRAMUSCULAR | Qty: 30 | Status: AC

## 2021-09-29 MED FILL — Sodium Chloride IV Soln 0.9%: INTRAVENOUS | Qty: 1000 | Status: AC

## 2021-09-29 NOTE — Evaluation (Signed)
Occupational Therapy Evaluation ?Patient Details ?Name: [name] ?MRN: [id number] ?DOB: 29-Jul-1940 ?Today's Date: 09/29/2021 ? ? ?History of Present Illness Pt is an 81 y.o. 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 PMH includes 3x prior back sxs (most recently 08/2019), bilateral TKA (2011, 2018), HTN, arthritis, anxiety.  ? ?Clinical Impression ?  ?Patient admitted for the procedure above.  PTA she lives alone, but states she has friends that can assist PRN.  Currently she is needing up to Mod A for lower body ADL at sit/stand level, and Min A for mobility over short distances, per PT eval.  The patient will need to demonstrate improved mobility distance and at a Mod I level, tolerate out of bed multiple times during the day, and be able to care for meal prep and ADL at a sit/stand level with greater independence than what she is currently exhibiting.  She would really be better served going to a SNF for short term rehab, as this OT is not convinced she has adequate assist at home to transition home alone.  OT will follow in the acute setting to maximize her functional status, and she is going home with [facility] services. Per the patient.   ?   ? ?Recommendations for follow up therapy are one component of a multi-disciplinary discharge planning process, led by the attending physician.  Recommendations may be updated based on patient status, additional functional criteria and insurance authorization.  ? ?Follow Up Recommendations ? Home health OT  ?  ?Assistance Recommended at Discharge Intermittent Supervision/Assistance  ?Patient can return home with the following Assistance with cooking/housework;Help with stairs or ramp for entrance;Assist for transportation;A little help with bathing/dressing/bathroom ? ?  ?Functional Status Assessment ? Patient has had a recent decline in their functional status and demonstrates the  ability to make significant improvements in function in a reasonable and predictable amount of time.  ?Equipment Recommendations ? None recommended by OT  ?  ?Recommendations for Other Services   ? ? ?  ?Precautions / Restrictions Precautions ?Precautions: Back;Fall ?Required Braces or Orthoses: Spinal Brace ?Spinal Brace: Lumbar corset ?Restrictions ?Weight Bearing Restrictions: No  ? ?  ? ?Mobility Bed Mobility ?Overal bed mobility: Needs Assistance ?Bed Mobility: Sidelying to Sit, Sit to Sidelying ?  ?Sidelying to sit: Min assist ?  ?  ?Sit to sidelying: Min assist ?  ?  ? ?Transfers ?  ?  ?  ?  ?  ?  ?  ?  ?  ?  ?  ? ?  ?Balance Overall balance assessment: Needs assistance ?Sitting-balance support: Feet supported, No upper extremity supported ?Sitting balance-Leahy Scale: Fair ?  ?  ?  ?  ?  ?  ?  ?  ?  ?  ?  ?  ?  ?  ?  ?  ?   ? ?ADL either performed or assessed with clinical judgement  ? ?ADL   ?  ?  ?Grooming: Wash/dry hands;Wash/dry face;Set up;Sitting ?  ?  ?  ?  ?  ?Upper Body Dressing : Minimal assistance;Sitting ?  ?Lower Body Dressing: Moderate assistance;Sit to/from stand ?  ?  ?  ?  ?  ?  ?  ?  ?   ? ? ? ?Vision Patient Visual Report: No change from baseline ?   ?   ?Perception Perception ?Perception: Not tested ?  ?Praxis Praxis ?Praxis: Not tested ?  ? ?Pertinent Vitals/Pain Pain  Assessment ?Faces Pain Scale: Hurts even more ?Pain Location: back ?Pain Descriptors / Indicators: Aching, Tender ?Pain Intervention(s): Monitored during session  ? ? ? ?Hand Dominance Right ?  ?Extremity/Trunk Assessment Upper Extremity Assessment ?Upper Extremity Assessment: Overall WFL for tasks assessed ?  ?Lower Extremity Assessment ?Lower Extremity Assessment: Defer to PT evaluation ?  ?Cervical / Trunk Assessment ?Cervical / Trunk Assessment: Back Surgery ?  ?Communication Communication ?Communication: No difficulties ?  ?Cognition Arousal/Alertness: Awake/alert ?Behavior During Therapy: [facility] for tasks  assessed/performed ?Overall Cognitive Status: Impaired/Different from baseline ?Area of Impairment: Safety/judgement ?  ?  ?  ?  ?  ?  ?  ?  ?  ?  ?Memory: Decreased recall of precautions ?  ?Safety/Judgement: Decreased awareness of deficits ?  ?  ?  ?  ?  ?General Comments    ? ?  ?Exercises   ?  ?Shoulder Instructions    ? ? ?Home Living Family/patient expects to be discharged to:: Private residence ?Living Arrangements: Alone ?Available Help at Discharge: Friend(s);Available PRN/intermittently ?Type of Home: House ?Home Access: Level entry ?  ?  ?Home Layout: One level ?  ?  ?Bathroom Shower/Tub: Walk-in shower ?  ?Bathroom Toilet: Handicapped height ?Bathroom Accessibility: Yes ?  ?Home Equipment: Conservation officer, nature (2 wheels);Cane - single point;Shower seat;Adaptive equipment ?Adaptive Equipment: Reacher;Sock aid ?  ?  ? ?  ?Prior Functioning/Environment   ?  ?  ?  ?  ?  ?  ?Mobility Comments: Mod indep ambulating with SPC ?ADLs Comments: states she was able to complete ADL and IADL ?  ? ?  ?  ?OT Problem List: Decreased strength;Impaired balance (sitting and/or standing);Decreased safety awareness;Decreased knowledge of use of DME or AE;Decreased activity tolerance;Pain ?  ?   ?OT Treatment/Interventions: Self-care/ADL training;Patient/family education;Balance training;Therapeutic activities  ?  ?OT Goals(Current goals can be found in the care plan section) Acute Rehab OT Goals ?Patient Stated Goal: Return home ?OT Goal Formulation: With patient ?Time For Goal Achievement: 10/13/21 ?Potential to Achieve Goals: Fair ?ADL Goals ?Pt Will Perform Grooming: with modified independence;standing ?Pt Will Perform Upper Body Dressing: with modified independence;sitting ?Pt Will Perform Lower Body Dressing: with modified independence;sit to/from stand ?Pt Will Transfer to Toilet: with modified independence;regular height toilet;ambulating  ?OT Frequency: Min 2X/week ?  ? ?Co-evaluation   ?  ?  ?  ?  ? ?  ?AM-PAC OT "6  Clicks" Daily Activity     ?Outcome Measure Help from another person eating meals?: None ?Help from another person taking care of personal grooming?: None ?Help from another person toileting, which includes using toliet, bedpan, or urinal?: A Little ?Help from another person bathing (including washing, rinsing, drying)?: A Lot ?Help from another person to put on and taking off regular upper body clothing?: A Little ?Help from another person to put on and taking off regular lower body clothing?: A Lot ?6 Click Score: 18 ?  ?End of Session   ? ?Activity Tolerance: Patient tolerated treatment well ?Patient left: in bed;with call bell/phone within reach ? ?OT Visit Diagnosis: Unsteadiness on feet (R26.81)  ?              ?Time: [id number] ?OT Time Calculation (min): 20 min ?Charges:  OT General Charges ?$OT Visit: 1 Visit ?OT Evaluation ?$OT Eval Moderate Complexity: 1 Mod ? ?09/29/2021 ? ?RP, OTR/L ? ?Acute Rehabilitation Services ? ?Office:  [phone] ? ? ?Francia Verry D Trase Bunda ?09/29/2021, 4:57 PM ?

## 2021-09-29 NOTE — Progress Notes (Signed)
4:00  Pt vomitted after the administration of pain medication.  IV zofran was administered and bed changed. ?

## 2021-09-29 NOTE — Progress Notes (Signed)
Physical Therapy Treatment ?Patient Details ?Name: [name] ?MRN: [id number] ?DOB: [birth year] ?Today's Date: 09/29/2021 ? ? ?History of Present Illness Pt is an 81 y.o. 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 PMH includes 3x prior back sxs (most recently 08/2019), bilateral TKA (2011, 2018), HTN, arthritis, anxiety. ? ?  ?PT Comments  ? ? Pt tolerates treatment well progressing to out of bed mobility. Pt is able to ambulate for limited household distances with UE support of walker at this time. Pt continues to benefit from verbal cues and physical assistance to perform bed mobility, and will likely need to be at a modI level prior to discharge home as she lives alone. PT continues to recommend HHPT services along with aggressive mobilization.   ?Recommendations for follow up therapy are one component of a multi-disciplinary discharge planning process, led by the attending physician.  Recommendations may be updated based on patient status, additional functional criteria and insurance authorization. ? ?Follow Up Recommendations ? Home health PT ?  ?  ?Assistance Recommended at Discharge Intermittent Supervision/Assistance  ?Patient can return home with the following A little help with walking and/or transfers;A little help with bathing/dressing/bathroom;Assistance with cooking/housework;Assist for transportation;Help with stairs or ramp for entrance ?  ?Equipment Recommendations ? None recommended by PT  ?  ?Recommendations for Other Services   ? ? ?  ?Precautions / Restrictions Precautions ?Precautions: Back;Fall ?Precaution Booklet Issued: No ?Precaution Comments: pt recalls 2/3 back precautions, 3/3 with verbal cues for BLT acronym ?Required Braces or Orthoses: Spinal Brace ?Spinal Brace: Lumbar corset;Applied in sitting position ?Restrictions ?Weight Bearing Restrictions: No  ?  ? ?Mobility ? Bed Mobility ?Overal  bed mobility: Needs Assistance ?Bed Mobility: Rolling, Sidelying to Sit ?Rolling: Min guard ?Sidelying to sit: Min assist ?  ?  ?  ?General bed mobility comments: minA to aide in elevating trunk into sitting ?  ? ?Transfers ?Overall transfer level: Needs assistance ?Equipment used: Rolling walker (2 wheels) ?Transfers: Sit to/from Stand, Bed to chair/wheelchair/BSC ?Sit to Stand: Min assist ?  ?Step pivot transfers: Min guard ?  ?  ?  ?  ?  ? ?Ambulation/Gait ?Ambulation/Gait assistance: Min guard ?Gait Distance (Feet): 30 Feet ?Assistive device: Rolling walker (2 wheels) ?Gait Pattern/deviations: Step-through pattern ?Gait velocity: reduced ?Gait velocity interpretation: <1.31 ft/sec, indicative of household ambulator ?  ?General Gait Details: pt with slowed step-through gait, mild increased in thoracic flexion ? ? ?Stairs ?  ?  ?  ?  ?  ? ? ?Wheelchair Mobility ?  ? ?Modified Rankin (Stroke Patients Only) ?  ? ? ?  ?Balance Overall balance assessment: Needs assistance ?Sitting-balance support: Feet supported, No upper extremity supported ?Sitting balance-Leahy Scale: Fair ?  ?  ?Standing balance support: Bilateral upper extremity supported, Reliant on assistive device for balance ?Standing balance-Leahy Scale: Poor ?  ?  ?  ?  ?  ?  ?  ?  ?  ?  ?  ?  ?  ? ?  ?Cognition Arousal/Alertness: Awake/alert ?Behavior During Therapy: [facility] for tasks assessed/performed ?Overall Cognitive Status: Impaired/Different from baseline ?Area of Impairment: Memory ?  ?  ?  ?  ?  ?  ?  ?  ?  ?  ?Memory: Decreased recall of precautions ?  ?  ?  ?  ?  ?  ?  ? ?  ?Exercises   ? ?  ?General Comments General comments (skin integrity, edema, etc.): VSS  on RA ?  ?  ? ?Pertinent Vitals/Pain Pain Assessment ?Pain Assessment: 0-10 ?Pain Score: 8  ?Pain Location: back ?Pain Descriptors / Indicators: Aching ?Pain Intervention(s): Patient requesting pain meds-RN notified  ? ? ?Home Living   ?  ?  ?  ?  ?  ?  ?  ?  ?  ?   ?  ?Prior Function    ?  ?   ?   ? ?PT Goals (current goals can now be found in the care plan section) Acute Rehab PT Goals ?Patient Stated Goal: decreased pain, return home ?Progress towards PT goals: Progressing toward goals ? ?  ?Frequency ? ? ? Min 5X/week ? ? ? ?  ?PT Plan Current plan remains appropriate  ? ? ?Co-evaluation   ?  ?  ?  ?  ? ?  ?AM-PAC PT "6 Clicks" Mobility   ?Outcome Measure ? Help needed turning from your back to your side while in a flat bed without using bedrails?: A Little ?Help needed moving from lying on your back to sitting on the side of a flat bed without using bedrails?: A Little ?Help needed moving to and from a bed to a chair (including a wheelchair)?: A Little ?Help needed standing up from a chair using your arms (e.g., wheelchair or bedside chair)?: A Little ?Help needed to walk in hospital room?: A Little ?Help needed climbing 3-5 steps with a railing? : Total ?6 Click Score: 16 ? ?  ?End of Session   ?Activity Tolerance: Patient tolerated treatment well ?Patient left: in chair;with call bell/phone within reach;with chair alarm set ?Nurse Communication: Mobility status ?PT Visit Diagnosis: Other abnormalities of gait and mobility (R26.89);Pain ?  ? ? ?Time: [id number] ?PT Time Calculation (min) (ACUTE ONLY): 31 min ? ?Charges:  $Gait Training: 8-22 mins ?$Therapeutic Activity: 8-22 mins          ?          ? ?Zenaida Niece, PT, DPT ?Acute Rehabilitation ?Pager: [phone] ?Office [phone] ? ? ? ?Zenaida Niece ?09/29/2021, 11:22 AM ? ?

## 2021-09-30 NOTE — Care Management Important Message (Signed)
Important Message ? ?Patient Details  ?Name: [name] ?MRN: [id number] ?Date of Birth: Jan 31, 1941 ? ? ?Medicare Important Message Given:  Yes ? ? ? ? ?Alexandr Yaworski ?09/30/2021, 3:19 PM ?

## 2021-09-30 NOTE — TOC Progression Note (Signed)
Transition of Care (TOC) - Progression Note  ? ? ?Patient Details  ?Name: [name] ?MRN: [id number] ?Date of Birth: Jan 23, 1941 ? ?Transition of Care (TOC) CM/SW Contact  ?Angelita Ingles, RN ?Phone Number:[phone] ? ?09/30/2021, 3:25 PM ? ?Clinical Narrative:    ?TOC continues follow. Patient now has recommendation for SNF. Sw will follow.  ? ? ?Expected Discharge Plan: [place] ?Barriers to Discharge: Continued Medical Work up ? ?Expected Discharge Plan and Services ?Expected Discharge Plan: [place] ?  ?Discharge Planning Services: CM Consult ?  ?Living arrangements for the past 2 months: [place] ?                ?  ?  ?  ?  ?  ?HH Arranged: PT, OT ?  ?  ?  ?  ? ? ?Social Determinants of Health (SDOH) Interventions ?  ? ?Readmission Risk Interventions ?   ? View : No data to display.  ?  ?  ?  ? ? ?

## 2021-09-30 NOTE — Progress Notes (Signed)
Physical Therapy Treatment ?Patient Details ?Name: [name] ?MRN: [id number] ?DOB: 13-Nov-1940 ?Today's Date: 09/30/2021 ? ? ?History of Present Illness Pt is an 81 y.o. 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 PMH includes 3x prior back sxs (most recently 08/2019), bilateral TKA (2011, 2018), HTN, arthritis, anxiety. ? ?  ?PT Comments  ? ? Pt is limited by nausea and vomiting during session, reports poor PO intake. Pt continues to require intermittent assistance for transfers and shows slowed gait speed compared to previous session. Pt reports limited assistance from elderly friends at the time of discharge. Pt is also unable to demonstrate the ability to don/doff back brace. PT updates recommendations to SNF placement, pt remains undecided on discharge plan.    ?Recommendations for follow up therapy are one component of a multi-disciplinary discharge planning process, led by the attending physician.  Recommendations may be updated based on patient status, additional functional criteria and insurance authorization. ? ?Follow Up Recommendations ? Skilled nursing-short term rehab (<3 hours/day) (limited progress and limited caregiver support) ?  ?  ?Assistance Recommended at Discharge Intermittent Supervision/Assistance  ?Patient can return home with the following A little help with walking and/or transfers;A little help with bathing/dressing/bathroom;Assistance with cooking/housework;Direct supervision/assist for medications management;Direct supervision/assist for financial management;Assist for transportation;Help with stairs or ramp for entrance ?  ?Equipment Recommendations ? None recommended by PT  ?  ?Recommendations for Other Services   ? ? ?  ?Precautions / Restrictions Precautions ?Precautions: Back;Fall ?Precaution Booklet Issued: No ?Precaution Comments: pt continues to require assistance with brace, unable  to demonstrate ability to don/doff at this time ?Required Braces or Orthoses: Spinal Brace ?Spinal Brace: Lumbar corset ?Restrictions ?Weight Bearing Restrictions: No  ?  ? ?Mobility ? Bed Mobility ?  ?  ?  ?  ?  ?  ?  ?  ?  ? ?Transfers ?Overall transfer level: Needs assistance ?Equipment used: Rolling walker (2 wheels) ?Transfers: Sit to/from Stand, Bed to chair/wheelchair/BSC ?Sit to Stand: Min guard, Min assist ?  ?  ?  ?  ?  ?General transfer comment: pt stands with increased time from recliner. Pt requires physical assistance to stand from elevated bed ?  ? ?Ambulation/Gait ?Ambulation/Gait assistance: Min guard ?Gait Distance (Feet): 15 Feet (15' x 2) ?Assistive device: Rolling walker (2 wheels) ?Gait Pattern/deviations: Step-to pattern ?Gait velocity: reduced ?Gait velocity interpretation: <1.31 ft/sec, indicative of household ambulator ?  ?General Gait Details: pt with slowed step-to gait, distances limited by nausea and vomiting ? ? ?Stairs ?  ?  ?  ?  ?  ? ? ?Wheelchair Mobility ?  ? ?Modified Rankin (Stroke Patients Only) ?  ? ? ?  ?Balance Overall balance assessment: Needs assistance ?Sitting-balance support: No upper extremity supported, Feet supported ?Sitting balance-Leahy Scale: Fair ?  ?  ?Standing balance support: Bilateral upper extremity supported, Reliant on assistive device for balance ?Standing balance-Leahy Scale: Poor ?  ?  ?  ?  ?  ?  ?  ?  ?  ?  ?  ?  ?  ? ?  ?Cognition Arousal/Alertness: Awake/alert ?Behavior During Therapy: [facility] for tasks assessed/performed ?Overall Cognitive Status: Impaired/Different from baseline ?Area of Impairment: Safety/judgement, Awareness ?  ?  ?  ?  ?  ?  ?  ?  ?  ?  ?  ?  ?Safety/Judgement: Decreased awareness of safety, Decreased awareness of deficits ?Awareness: Emergent ?  ?General Comments: poor recognition  of assistance requirements and available caregiver support ?  ?  ? ?  ?Exercises   ? ?  ?General Comments General comments (skin integrity, edema,  etc.): pt on 3L  [place] upon PT arrival. PT weans to room air, sats 90-92% at end of session. Pt mildly lethargic at completion of session. PT places pt back on 2L [place] ?  ?  ? ?Pertinent Vitals/Pain Pain Assessment ?Pain Assessment: 0-10 ?Faces Pain Scale: Hurts even more ?Pain Location: back ?Pain Descriptors / Indicators: Grimacing ?Pain Intervention(s): Monitored during session  ? ? ?Home Living   ?  ?  ?  ?  ?  ?  ?  ?  ?  ?   ?  ?Prior Function    ?  ?  ?   ? ?PT Goals (current goals can now be found in the care plan section) Acute Rehab PT Goals ?Patient Stated Goal: decreased pain, return home ?Progress towards PT goals: Not progressing toward goals - comment (limited by nausea and vomiting) ? ?  ?Frequency ? ? ? Min 5X/week ? ? ? ?  ?PT Plan Current plan remains appropriate  ? ? ?Co-evaluation   ?  ?  ?  ?  ? ?  ?AM-PAC PT "6 Clicks" Mobility   ?Outcome Measure ? Help needed turning from your back to your side while in a flat bed without using bedrails?: A Little ?Help needed moving from lying on your back to sitting on the side of a flat bed without using bedrails?: A Little ?Help needed moving to and from a bed to a chair (including a wheelchair)?: A Little ?Help needed standing up from a chair using your arms (e.g., wheelchair or bedside chair)?: A Little ?Help needed to walk in hospital room?: Total ?Help needed climbing 3-5 steps with a railing? : Total ?6 Click Score: 14 ? ?  ?End of Session Equipment Utilized During Treatment: Oxygen ?Activity Tolerance: Treatment limited secondary to medical complications (Comment) (nause and vomiting) ?Patient left: in chair;with call bell/phone within reach;with chair alarm set ?Nurse Communication: Mobility status ?PT Visit Diagnosis: Other abnormalities of gait and mobility (R26.89);Pain ?Pain - part of body:  (back) ?  ? ? ?Time: 2458-0998 ?PT Time Calculation (min) (ACUTE ONLY): 41 min ? ?Charges:  $Gait Training: 23-37 mins ?$Therapeutic Activity: 8-22 mins           ?          ? ?Arlyss Gandy, PT, DPT ?Acute Rehabilitation ?Pager: [phone] ?Office [phone] ? ? ? ?Arlyss Gandy ?09/30/2021, 11:48 AM ? ?

## 2021-09-30 NOTE — TOC Initial Note (Signed)
Transition of Care (TOC) - Initial/Assessment Note  ? ? ?Patient Details  ?Name: [name] ?MRN: [id number] ?Date of Birth: 01-Dec-1940 ? ?Transition of Care (TOC) CM/SW Contact:    ?Lockie Pares, RN ?Phone Number: ?09/30/2021, 8:57 AM ? ?Clinical Narrative:                 ?81 year old with PMH of multiple surgeries, DDD, here with stabilization of t9.  She has had Home Health previously, last in 2021 with [facility].  ?She will need HH PT and OT upon discharge.  ? ?CM will follow for additional needs, recommendations, and transitions. ? ?Expected Discharge Plan: Home w Home Health Services ?Barriers to Discharge: Continued Medical Work up ? ? ?Patient Goals and CMS Choice ?  ?  ?  ? ?Expected Discharge Plan and Services ?Expected Discharge Plan: Home w Home Health Services ?  ?Discharge Planning Services: CM Consult ?  ?Living arrangements for the past 2 months: Single Family Home ?                ?  ?  ?  ?  ?  ?HH Arranged: PT, OT ?  ?  ?  ?  ? ?Prior Living Arrangements/Services ?Living arrangements for the past 2 months: Single Family Home ?  ?Patient language and need for interpreter reviewed:: Yes ?       ?Need for Family Participation in Patient Care: Yes (Comment) ?Care giver support system in place?: Yes (comment) ?  ?Criminal Activity/Legal Involvement Pertinent to Current Situation/Hospitalization: No - Comment as needed ? ?Activities of Daily Living ?  ?  ? ?Permission Sought/Granted ?  ?  ?   ?   ?   ?   ? ?Emotional Assessment ?  ?  ?  ?Orientation: : Oriented to Self, Oriented to Place, Oriented to  Time, Oriented to Situation ?Alcohol / Substance Use: Not Applicable ?Psych Involvement: No (comment) ? ?Admission diagnosis:  Lumbar stenosis with neurogenic claudication [M48.062] ?Patient Active Problem List  ? Diagnosis Date Noted  ? Other spondylosis with radiculopathy, lumbar region 02/09/2021  ? Lumbar stenosis with neurogenic claudication 09/03/2019  ? Trochanteric bursitis, right hip  01/14/2019  ? Lumbar radiculopathy, chronic 06/29/2018  ? OA (osteoarthritis) of knee 09/19/2016  ? Palpitations 12/09/2015  ? Right carotid bruit 12/09/2015  ? Syncope 12/08/2015  ? Degenerative disc disease, lumbar 12/08/2015  ? Essential hypertension 12/08/2015  ? High cholesterol   ? Arthritis   ? ?PCP:  Geoffry Paradise, MD ?Pharmacy:   ?[id number] - [place] [place] - [address] [facility] [address] ?[address] ?[place] [address] ?Phone: [phone] Fax: [phone] ? ? ? ? ?Social Determinants of Health (SDOH) Interventions ?  ? ?Readmission Risk Interventions ?   ? View : No data to display.  ?  ?  ?  ? ? ? ?

## 2021-10-01 ENCOUNTER — Inpatient Hospital Stay (HOSPITAL_COMMUNITY): Payer: Medicare Other

## 2021-10-01 DIAGNOSIS — E876 Hypokalemia: Secondary | ICD-10-CM

## 2021-10-01 DIAGNOSIS — R110 Nausea: Secondary | ICD-10-CM | POA: Diagnosis not present

## 2021-10-01 DIAGNOSIS — I10 Essential (primary) hypertension: Secondary | ICD-10-CM

## 2021-10-01 LAB — HEPATIC FUNCTION PANEL
ALT: 20 U/L (ref 0–44)
AST: 52 U/L — ABNORMAL HIGH (ref 15–41)
Albumin: 2.5 g/dL — ABNORMAL LOW (ref 3.5–5.0)
Alkaline Phosphatase: 109 U/L (ref 38–126)
Bilirubin, Direct: 0.2 mg/dL (ref 0.0–0.2)
Indirect Bilirubin: 0.5 mg/dL (ref 0.3–0.9)
Total Bilirubin: 0.7 mg/dL (ref 0.3–1.2)
Total Protein: 5.6 g/dL — ABNORMAL LOW (ref 6.5–8.1)

## 2021-10-01 LAB — BASIC METABOLIC PANEL
Anion gap: 9 (ref 5–15)
BUN: 11 mg/dL (ref 8–23)
CO2: 28 mmol/L (ref 22–32)
Calcium: 8.2 mg/dL — ABNORMAL LOW (ref 8.9–10.3)
Chloride: 97 mmol/L — ABNORMAL LOW (ref 98–111)
Creatinine, Ser: 0.77 mg/dL (ref 0.44–1.00)
GFR, Estimated: >60 mL/min (ref >60)
Glucose, Bld: 125 mg/dL — ABNORMAL HIGH (ref 70–99)
Potassium: 2.8 mmol/L — ABNORMAL LOW (ref 3.5–5.1)
Sodium: 134 mmol/L — ABNORMAL LOW (ref 135–145)

## 2021-10-01 LAB — CBC
HCT: 26.3 % — ABNORMAL LOW (ref 36.0–46.0)
Hemoglobin: 8.7 g/dL — ABNORMAL LOW (ref 12.0–15.0)
MCH: 30.3 pg (ref 26.0–34.0)
MCHC: 33.1 g/dL (ref 30.0–36.0)
MCV: 91.6 fL (ref 80.0–100.0)
Platelets: 182 10*3/uL (ref 150–400)
RBC: 2.87 MIL/uL — ABNORMAL LOW (ref 3.87–5.11)
RDW: 13.4 % (ref 11.5–15.5)
WBC: 11.6 10*3/uL — ABNORMAL HIGH (ref 4.0–10.5)
nRBC: 0 % (ref 0.0–0.2)

## 2021-10-01 LAB — MAGNESIUM: Magnesium: 1.8 mg/dL (ref 1.7–2.4)

## 2021-10-01 MED ORDER — PANTOPRAZOLE SODIUM 40 MG IV SOLR
40.0000 mg | Freq: Once | INTRAVENOUS | Status: AC
  Administered 2021-10-01: 40 mg via INTRAVENOUS
  Filled 2021-10-01: qty 10

## 2021-10-01 MED ORDER — POTASSIUM CHLORIDE 10 MEQ/100ML IV SOLN
10.0000 meq | INTRAVENOUS | Status: AC
  Administered 2021-10-01 (×3): 10 meq via INTRAVENOUS
  Filled 2021-10-01 (×2): qty 100

## 2021-10-01 MED ORDER — MAGNESIUM SULFATE 2 GM/50ML IV SOLN
2.0000 g | Freq: Once | INTRAVENOUS | Status: AC
  Administered 2021-10-01: 2 g via INTRAVENOUS
  Filled 2021-10-01: qty 50

## 2021-10-01 MED ORDER — PANTOPRAZOLE SODIUM 40 MG PO TBEC
40.0000 mg | DELAYED_RELEASE_TABLET | Freq: Every day | ORAL | Status: DC
  Administered 2021-10-02 – 2021-10-06 (×5): 40 mg via ORAL
  Filled 2021-10-01 (×5): qty 1

## 2021-10-01 MED ORDER — SODIUM CHLORIDE 0.45 % IV SOLN: INTRAVENOUS | Status: DC

## 2021-10-01 MED ORDER — ENOXAPARIN SODIUM 40 MG/0.4ML IJ SOSY
40.0000 mg | PREFILLED_SYRINGE | INTRAMUSCULAR | Status: DC
  Administered 2021-10-01 – 2021-10-05 (×5): 40 mg via SUBCUTANEOUS
  Filled 2021-10-01 (×5): qty 0.4

## 2021-10-01 MED ORDER — POLYETHYLENE GLYCOL 3350 17 G PO PACK
17.0000 g | PACK | Freq: Two times a day (BID) | ORAL | Status: DC
  Administered 2021-10-01 – 2021-10-06 (×8): 17 g via ORAL
  Filled 2021-10-01 (×9): qty 1

## 2021-10-01 MED ORDER — SENNOSIDES-DOCUSATE SODIUM 8.6-50 MG PO TABS
2.0000 | ORAL_TABLET | Freq: Two times a day (BID) | ORAL | Status: DC
  Administered 2021-10-01 – 2021-10-06 (×10): 2 via ORAL
  Filled 2021-10-01 (×10): qty 2

## 2021-10-01 MED ORDER — POTASSIUM CHLORIDE CRYS ER 20 MEQ PO TBCR
40.0000 meq | EXTENDED_RELEASE_TABLET | ORAL | Status: AC
  Administered 2021-10-01 (×2): 40 meq via ORAL
  Filled 2021-10-01 (×2): qty 2

## 2021-10-01 NOTE — Progress Notes (Signed)
Mobility Specialist Progress Note  ? ? 10/01/21 1618  ?Mobility  ?Activity Refused mobility  ? ?Pt c/o pain. Will f/u as schedule permits.  ? ?[place] ?Mobility Specialist  ?Primary: 5N M.S. Phone: [phone] ?Secondary: 6N M.S. Phone: [phone] ?  ?

## 2021-10-01 NOTE — Progress Notes (Signed)
Physical Therapy Treatment ?Patient Details ?Name: [name] ?MRN: [id number] ?DOB: Sep 19, 1940 ?Today's Date: 10/01/2021 ? ? ?History of Present Illness Pt is an 81 y.o. 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 PMH includes 3x prior back sxs (most recently 08/2019), bilateral TKA (2011, 2018), HTN, arthritis, anxiety. ? ?  ?PT Comments  ? ? Pt tolerates treatment well, ambulating for increased distances without reports of nausea. Pt continues to remain weak, needing assistance to perform bed mobility and transfer. Pt will benefit from continued aggressive mobilization in an effort to improve activity tolerance and restore independence. PT updates recommendations to AIR as the pt demonstrates improved activity tolerance and participation.   ?Recommendations for follow up therapy are one component of a multi-disciplinary discharge planning process, led by the attending physician.  Recommendations may be updated based on patient status, additional functional criteria and insurance authorization. ? ?Follow Up Recommendations ? Acute inpatient rehab (3hours/day) ?  ?  ?Assistance Recommended at Discharge Intermittent Supervision/Assistance  ?Patient can return home with the following A little help with walking and/or transfers;A little help with bathing/dressing/bathroom;Assistance with cooking/housework;Direct supervision/assist for medications management;Direct supervision/assist for financial management;Assist for transportation;Help with stairs or ramp for entrance ?  ?Equipment Recommendations ? None recommended by PT  ?  ?Recommendations for Other Services Rehab consult ? ? ?  ?Precautions / Restrictions Precautions ?Precautions: Back;Fall ?Precaution Booklet Issued: No ?Precaution Comments: pt continues to require assistance with brace, but is able to don with minA ?Required Braces or Orthoses: Spinal Brace ?Spinal  Brace: Lumbar corset ?Restrictions ?Weight Bearing Restrictions: No  ?  ? ?Mobility ? Bed Mobility ?Overal bed mobility: Needs Assistance ?Bed Mobility: Sit to Sidelying, Rolling ?Rolling: Min assist ?  ?  ?  ?Sit to sidelying: Mod assist ?General bed mobility comments: requiring assistance for LE management ?  ? ?Transfers ?Overall transfer level: Needs assistance ?Equipment used: Rolling walker (2 wheels) ?Transfers: Sit to/from Stand ?Sit to Stand: Min assist ?  ?  ?  ?  ?  ?General transfer comment: cues for increased trunk flexion and hand placement ?  ? ?Ambulation/Gait ?Ambulation/Gait assistance: Min guard ?Gait Distance (Feet): 45 Feet ?Assistive device: Rolling walker (2 wheels) ?Gait Pattern/deviations: Step-through pattern, Decreased stride length ?Gait velocity: reduced ?Gait velocity interpretation: <1.31 ft/sec, indicative of household ambulator ?  ?General Gait Details: pt with slowed step-through gait, very short stride length ? ? ?Stairs ?  ?  ?  ?  ?  ? ? ?Wheelchair Mobility ?  ? ?Modified Rankin (Stroke Patients Only) ?  ? ? ?  ?Balance Overall balance assessment: Needs assistance ?Sitting-balance support: No upper extremity supported, Feet supported ?Sitting balance-Leahy Scale: Fair ?  ?  ?Standing balance support: Bilateral upper extremity supported, Reliant on assistive device for balance ?Standing balance-Leahy Scale: Poor ?  ?  ?  ?  ?  ?  ?  ?  ?  ?  ?  ?  ?  ? ?  ?Cognition Arousal/Alertness: Awake/alert ?Behavior During Therapy: [facility] for tasks assessed/performed ?Overall Cognitive Status: Within Functional Limits for tasks assessed ?  ?  ?  ?  ?  ?  ?  ?  ?  ?  ?  ?  ?  ?  ?  ?  ?  ?  ?  ? ?  ?Exercises   ? ?  ?General Comments General comments (skin integrity, edema, etc.): VSS on RA ?  ?  ? ?  Pertinent Vitals/Pain Pain Assessment ?Pain Assessment: Faces ?Faces Pain Scale: Hurts even more ?Pain Location: back ?Pain Descriptors / Indicators: Grimacing ?Pain Intervention(s): Monitored  during session  ? ? ?Home Living   ?  ?  ?  ?  ?  ?  ?  ?  ?  ?   ?  ?Prior Function    ?  ?  ?   ? ?PT Goals (current goals can now be found in the care plan section) Acute Rehab PT Goals ?Patient Stated Goal: decreased pain, return home ?Progress towards PT goals: Progressing toward goals ? ?  ?Frequency ? ? ? Min 5X/week ? ? ? ?  ?PT Plan Current plan remains appropriate  ? ? ?Co-evaluation   ?  ?  ?  ?  ? ?  ?AM-PAC PT "6 Clicks" Mobility   ?Outcome Measure ? Help needed turning from your back to your side while in a flat bed without using bedrails?: A Little ?Help needed moving from lying on your back to sitting on the side of a flat bed without using bedrails?: A Lot ?Help needed moving to and from a bed to a chair (including a wheelchair)?: A Little ?Help needed standing up from a chair using your arms (e.g., wheelchair or bedside chair)?: A Little ?Help needed to walk in hospital room?: A Little ?Help needed climbing 3-5 steps with a railing? : Total ?6 Click Score: 15 ? ?  ?End of Session Equipment Utilized During Treatment: Back brace ?Activity Tolerance: Patient tolerated treatment well ?Patient left: in bed;with call bell/phone within reach;with bed alarm set ?Nurse Communication: Mobility status ?PT Visit Diagnosis: Other abnormalities of gait and mobility (R26.89);Pain ?  ? ? ?Time: [phone] ?PT Time Calculation (min) (ACUTE ONLY): 26 min ? ?Charges:  $Gait Training: 8-22 mins ?$Therapeutic Activity: 8-22 mins          ?          ? ?Zenaida Niece, PT, DPT ?Acute Rehabilitation ?Pager: [phone] ?Office [phone] ? ? ? ?Zenaida Niece ?10/01/2021, 10:31 AM ? ?

## 2021-10-01 NOTE — Progress Notes (Signed)
Inpatient Rehab Admissions Coordinator:  ? ?Spoke to pt over the phone.  Explained CIR goals/expectations and insurance auth process.  Note she's progressing well with therapies.  I will start insurance auth request today.  ? ?Shann Medal, PT, DPT ?Admissions Coordinator ?[phone] ?10/01/21  ?1:42 PM ? ?

## 2021-10-01 NOTE — Progress Notes (Signed)
Pt refused to go down to xray for 2 view abd xray as she was "too tired and would not be going down to get an xray tonight" ? ?Nurse notified [facility] team ?

## 2021-10-01 NOTE — TOC Initial Note (Addendum)
Transition of Care (TOC) - Initial/Assessment Note  ? ? ?Patient Details  ?Name: [name] ?MRN: [id number] ?Date of Birth: [birth year] ? ?Transition of Care (TOC) CM/SW Contact:    ?Vinie Sill, LCSW ?Phone Number: ?10/01/2021, 1:18 PM ? ?Clinical Narrative:                 ? ?CSW met with patient at bedside. CSW introduced self and explained role. Patient confirmed she lives int he home alone. CSW discussed with patient SNF as back up plan to inpatient rehab. Patient states " I haven't heard good things about them". She expressed she does not want to go to short term rehab at [facility]. Patient declined CSW starting the process as back up plan. Patient states, if unable to admit to CIR she wants to discharge home w/ HH. ? ?TOC will continue to follow and assist with discharge planning. ? ?Thurmond Butts, MSW, LCSW ?Clinical Social Worker ? ? ? ?Expected Discharge Plan: [place] ?Barriers to Discharge: Continued Medical Work up, Ship broker ? ? ?Patient Goals and CMS Choice ?  ?  ?  ? ?Expected Discharge Plan and Services ?Expected Discharge Plan: [place] ?  ?Discharge Planning Services: CM Consult ?  ?Living arrangements for the past 2 months: [place] ?                ?  ?  ?  ?  ?  ?HH Arranged: PT, OT ?  ?  ?  ?  ? ?Prior Living Arrangements/Services ?Living arrangements for the past 2 months: [place] ?Lives with:: Self ?Patient language and need for interpreter reviewed:: Yes ?       ?Need for Family Participation in Patient Care: Yes (Comment) ?Care giver support system in place?: Yes (comment) ?  ?Criminal Activity/Legal Involvement Pertinent to Current Situation/Hospitalization: No - Comment as needed ? ?Activities of Daily Living ?Home Assistive Devices/Equipment: Cane (specify quad or straight), Walker (specify type) ?ADL Screening (condition at time of admission) ?Patient's cognitive ability adequate to safely complete daily activities?: Yes ?Is the  patient deaf or have difficulty hearing?: No ?Does the patient have difficulty seeing, even when wearing glasses/contacts?: No ?Does the patient have difficulty concentrating, remembering, or making decisions?: No ?Patient able to express need for assistance with ADLs?: Yes ?Does the patient have difficulty dressing or bathing?: No ?Independently performs ADLs?: Yes (appropriate for developmental age) ?Does the patient have difficulty walking or climbing stairs?: Yes ?Weakness of Legs: Both ?Weakness of Arms/Hands: Both ? ?Permission Sought/Granted ?  ?  ?   ?   ?   ?   ? ?Emotional Assessment ?  ?Attitude/Demeanor/Rapport: Engaged ?Affect (typically observed): Pleasant ?Orientation: : Oriented to Self, Oriented to Place, Oriented to  Time, Oriented to Situation ?Alcohol / Substance Use: Not Applicable ?Psych Involvement: No (comment) ? ?Admission diagnosis:  Lumbar stenosis with neurogenic claudication [M48.062] ?Patient Active Problem List  ? Diagnosis Date Noted  ? Other spondylosis with radiculopathy, lumbar region 02/09/2021  ? Lumbar stenosis with neurogenic claudication 09/03/2019  ? Trochanteric bursitis, right hip 01/14/2019  ? Lumbar radiculopathy, chronic 06/29/2018  ? OA (osteoarthritis) of knee 09/19/2016  ? Palpitations 12/09/2015  ? Right carotid bruit 12/09/2015  ? Syncope 12/08/2015  ? Degenerative disc disease, lumbar 12/08/2015  ? Essential hypertension 12/08/2015  ? High cholesterol   ? Arthritis   ? ?PCP:  Burnard Bunting, MD ?Pharmacy:   ?[id number] [place], [place] - [address] ?[place] ?[place] [address] ?Phone: [phone] Fax: [phone] ? ? ? ? ?Social Determinants of Health (SDOH) Interventions ?  ? ?Readmission Risk Interventions ?   ? View : No data to display.  ?  ?  ?  ? ? ? ?

## 2021-10-01 NOTE — Progress Notes (Signed)
Occupational Therapy Treatment ?Patient Details ?Name: [name] ?MRN: [id number] ?DOB: 1940-11-10 ?Today's Date: 10/01/2021 ? ? ?History of present illness Pt is an 81 y.o. 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 PMH includes 3x prior back sxs (most recently 08/2019), bilateral TKA (2011, 2018), HTN, arthritis, anxiety. ?  ?OT comments ? Pt demonstrates strong L lean this session and requires (A) To sustain EOB sitting with bil feet on the floor to don brace with (A). Pt reports 10/10 pain and pt verbalized no medication since 6am. Pt encouraged and calling for RN to bring medications at the conclusion of session. Session bed level with education on brace due to pain level noted with repositioning. Recommendation updated to CIR at this time.  ?  ? ?Recommendations for follow up therapy are one component of a multi-disciplinary discharge planning process, led by the attending physician.  Recommendations may be updated based on patient status, additional functional criteria and insurance authorization. ?   ?Follow Up Recommendations ? Acute inpatient rehab (3hours/day)  ?  ?Assistance Recommended at Discharge Intermittent Supervision/Assistance  ?Patient can return home with the following ? Assistance with cooking/housework;Help with stairs or ramp for entrance;Assist for transportation;A little help with bathing/dressing/bathroom ?  ?Equipment Recommendations ? None recommended by OT  ?  ?Recommendations for Other Services   ? ?  ?Precautions / Restrictions Precautions ?Precautions: Back;Fall ?Precaution Booklet Issued: No ?Precaution Comments: back brace ?Required Braces or Orthoses: Spinal Brace ?Spinal Brace: Lumbar corset  ? ? ?  ? ?Mobility Bed Mobility ?Overal bed mobility: Needs Assistance ?Bed Mobility: Rolling, Supine to Sit, Sit to Supine ?Rolling: Mod assist ?  ?Supine to sit: Max assist, HOB elevated ?Sit  to supine: Mod assist ?  ?General bed mobility comments: pt increasing hOB up on her own and unable to push of bed surface with L UE. ?  ? ?Transfers ?  ?  ?  ?  ?  ?  ?  ?  ?  ?  ?  ?  ?Balance Overall balance assessment: Needs assistance ?Sitting-balance support: Bilateral upper extremity supported, Feet supported ?Sitting balance-Leahy Scale: Poor ?Sitting balance - Comments: strong L lean ?  ?  ?  ?  ?  ?  ?  ?  ?  ?  ?  ?  ?  ?  ?  ?   ? ?ADL either performed or assessed with clinical judgement  ? ?ADL Overall ADL's : Needs assistance/impaired ?  ?  ?  ?  ?  ?  ?  ?  ?Upper Body Dressing : Moderate assistance;Sitting ?Upper Body Dressing Details (indicate cue type and reason): strong L lean and needs (A) to sustain static sitting. pt able to clasp brace with increased time ?  ?  ?  ?  ?  ?  ?  ?  ?  ?General ADL Comments: pt reports being tired and painful. Rn notified need for pain medications ?  ? ?Extremity/Trunk Assessment Upper Extremity Assessment ?Upper Extremity Assessment: LUE deficits/detail ?LUE Deficits / Details: decreased ROM reaching posteriorly ?  ?Lower Extremity Assessment ?Lower Extremity Assessment: Defer to PT evaluation ?  ?  ?  ? ?Vision   ?  ?  ?Perception   ?  ?Praxis   ?  ? ?Cognition Arousal/Alertness: Awake/alert ?Behavior During Therapy: [facility] for tasks assessed/performed ?Overall Cognitive Status: Within Functional Limits for tasks assessed ?  ?  ?  ?  ?  ?  ?  ?  ?  ?  ?  ?  ?  ?  ?  ?  ?  General Comments: flat facial expressions, slow processing ?  ?  ?   ?Exercises   ? ?  ?Shoulder Instructions   ? ? ?  ?General Comments VSS  ? ? ?Pertinent Vitals/ Pain       Pain Assessment ?Pain Assessment: 0-10 ?Pain Score: 10-Worst pain ever ?Pain Location: back ?Pain Descriptors / Indicators: Grimacing ?Pain Intervention(s): Monitored during session, Repositioned, Premedicated before session ? ?Home Living   ?  ?  ?  ?  ?  ?  ?  ?  ?  ?  ?  ?  ?  ?  ?  ?  ?  ?  ? ?  ?Prior  Functioning/Environment    ?  ?  ?  ?   ? ?Frequency ? Min 2X/week  ? ? ? ? ?  ?Progress Toward Goals ? ?OT Goals(current goals can now be found in the care plan section) ? Progress towards OT goals: Progressing toward goals ? ?Acute Rehab OT Goals ?Patient Stated Goal: to get warm ?OT Goal Formulation: With patient ?Time For Goal Achievement: 10/13/21 ?Potential to Achieve Goals: Fair ?ADL Goals ?Pt Will Perform Grooming: with modified independence;standing ?Pt Will Perform Upper Body Dressing: with modified independence;sitting ?Pt Will Perform Lower Body Dressing: with modified independence;sit to/from stand ?Pt Will Transfer to Toilet: with modified independence;regular height toilet;ambulating  ?Plan Discharge plan remains appropriate   ? ?Co-evaluation ? ? ?   ?  ?  ?  ?  ? ?  ?AM-PAC OT "6 Clicks" Daily Activity     ?Outcome Measure ? ? Help from another person eating meals?: None ?Help from another person taking care of personal grooming?: None ?Help from another person toileting, which includes using toliet, bedpan, or urinal?: A Little ?Help from another person bathing (including washing, rinsing, drying)?: A Lot ?Help from another person to put on and taking off regular upper body clothing?: A Little ?Help from another person to put on and taking off regular lower body clothing?: A Lot ?6 Click Score: 18 ? ?  ?End of Session Equipment Utilized During Treatment: Back brace ? ?OT Visit Diagnosis: Unsteadiness on feet (R26.81) ?  ?Activity Tolerance Patient tolerated treatment well ?  ?Patient Left in bed;with call bell/phone within reach ?  ?Nurse Communication Mobility status ?  ? ?   ? ?Time: [id number] ?OT Time Calculation (min): 21 min ? ?Charges: OT General Charges ?$OT Visit: 1 Visit ?OT Treatments ?$Self Care/Home Management : 8-22 mins ? ? ?Brynn, OTR/L  ?Acute Rehabilitation Services ?Office: [phone] ?. ? ? ?Jeri Modena ?10/01/2021, 4:00 PM ?

## 2021-10-01 NOTE — Consult Note (Addendum)
?[facility] ?Medical Consultation ? ?[name] UTM:[id number] DOB: [birth year] DOA: 09/28/2021 ? ? ?PCP: [name]   ? ?Requesting physician: Dr. Ellene Route with the neurosurgery ?Date of consultation: 10/01/2021 ? ?Reason for consultation: Hypokalemia ? ?Chief Complaint: Nausea ? ?HPI: [name] is a 81 y.o. female with a past medical history of essential hypertension, hyperlipidemia, chronic back issues, who is hospitalized for severe spinal stenosis and underwent posterior decompression and discectomy of T12-L1 along with posterior lumbar interbody fusion.  Surgery was performed on 4/25.  Patient has been complaining of nausea and has been fatigued for the past 48 hours.  No vomiting reported.  Blood work revealed hypokalemia.  Patient denies any chest pain or shortness of breath.  No real abdominal discomfort.  Denies any dysuria.  Experiencing back pain as is expected after surgery.  Last bowel movement was on Monday.  Has been passing gas from below. ? ? ?Home Medications: ?Prior to Admission medications   ?Medication Sig Start Date End Date Taking? Authorizing Provider  ?amLODipine (NORVASC) 5 MG tablet Take 5 mg by mouth at bedtime.   Yes Provider, Historical, Blake  ?Cholecalciferol (VITAMIN D3) 50 MCG (2000 UT) capsule Take 2,000 Units by mouth daily.   Yes Provider, Historical, Blake  ?donepezil (ARICEPT) 5 MG tablet Take 1 tablet by mouth at bedtime.   Yes Provider, Historical, Blake  ?enalapril (VASOTEC) 20 MG tablet Take 20 mg by mouth 2 (two) times daily.   Yes Provider, Historical, Blake  ?estradiol (ESTRACE) 0.5 MG tablet Take one half tablet (0.25 mg) by mouth daily. ?Patient taking differently: Take 0.5 mg by mouth daily. 07/07/21  Yes [name]  ?gabapentin (NEURONTIN) 300 MG capsule Take 300 mg by mouth daily as needed (Nerve pain). 01/28/20  Yes Provider, Historical, Blake  ?HYDROcodone-acetaminophen (NORCO/VICODIN) 5-325 MG tablet Take 1 tablet by mouth daily as needed  for moderate pain or severe pain. 04/20/21  Yes Provider, Historical, Blake  ?meloxicam (MOBIC) 15 MG tablet Take 15 mg by mouth daily.   Yes Provider, Historical, Blake  ?methocarbamol (ROBAXIN) 500 MG tablet Take 1 tablet (500 mg total) by mouth every 6 (six) hours as needed for muscle spasms. 09/04/19  Yes Kristeen Miss, Blake  ?pravastatin (PRAVACHOL) 20 MG tablet Take 20 mg by mouth at bedtime.   Yes Provider, Historical, Blake  ?progesterone (PROMETRIUM) 100 MG capsule Take 1 capsule (100 mg total) by mouth daily. ?Patient taking differently: Take 100 mg by mouth at bedtime. 07/07/21  Yes [name]  ?sertraline (ZOLOFT) 50 MG tablet Take 50 mg by mouth daily.   Yes Provider, Historical, Blake  ? ? ?Current Inpatient Medications: Scheduled: ? amLODipine  5 mg Oral QHS  ? donepezil  5 mg Oral QHS  ? enalapril  20 mg Oral BID  ? estradiol  0.5 mg Oral Daily  ? [START ON 10/02/2021] pantoprazole  40 mg Oral Q1200  ? pantoprazole (PROTONIX) IV  40 mg Intravenous Once  ? polyethylene glycol  17 g Oral BID  ? potassium chloride  40 mEq Oral Q4H  ? pravastatin  20 mg Oral QHS  ? progesterone  100 mg Oral QHS  ? senna-docusate  2 tablet Oral BID  ? sertraline  50 mg Oral Daily  ? sodium chloride flush  3 mL Intravenous Q12H  ? ?Continuous: ? sodium chloride    ? sodium chloride    ?  ceFAZolin (ANCEF) IV 2 g (10/01/21 0915)  ?  methocarbamol (ROBAXIN) IV    ? potassium chloride    ? ?KVQ:QVZDGLOVFIEPP **OR** acetaminophen, alum & mag hydroxide-simeth, bisacodyl, gabapentin, HYDROcodone-acetaminophen, HYDROmorphone (DILAUDID) injection, menthol-cetylpyridinium **OR** phenol, methocarbamol **OR** methocarbamol (ROBAXIN) IV, methocarbamol, ondansetron **OR** ondansetron (ZOFRAN) IV, sodium chloride flush, sodium phosphate ? ?Allergies: No Known Allergies ? ?Past Medical History: ?Past Medical History:  ?Diagnosis Date  ? Anxiety   ? Arthritis   ? Bartholin's gland abscess 04/2017  ? Left, incised and drained  ? High  cholesterol   ? History of iron deficiency anemia   ? Hypertension   ? ? ?Past Surgical History:  ?Procedure Laterality Date  ? ANTERIOR LAT LUMBAR FUSION N/A 09/03/2019  ? Procedure: Lumbar one-two Anterolateral lumbar interbody fusion with lateral plate;  Surgeon: Kristeen Miss, Blake;  Location: [place];  Service: Neurosurgery;  Laterality: N/A;  ? APPLICATION OF ROBOTIC ASSISTANCE FOR SPINAL PROCEDURE N/A 09/28/2021  ? Procedure: APPLICATION OF ROBOTIC ASSISTANCE FOR SPINAL PROCEDURE;  Surgeon: Kristeen Miss, Blake;  Location: [place];  Service: Neurosurgery;  Laterality: N/A;  ? BACK SURGERY    ? BREAST EXCISIONAL BIOPSY Right   ? x 2  ? BUNIONECTOMY    ? CHOLECYSTECTOMY    ? COLONOSCOPY    ? DIAGNOSTIC LAPAROSCOPY    ? EYE SURGERY Bilateral   ? CATARACT SURGERY  ? HARDWARE REMOVAL Right 02/09/2021  ? Procedure: removal of right iliac crest screw with Met-rx;  Surgeon: Kristeen Miss, Blake;  Location: [place];  Service: Neurosurgery;  Laterality: Right;  ? LUMBAR FUSION  2011  ? OPEN SURGICAL REPAIR OF GLUTEAL TENDON Right 01/14/2019  ? Procedure: Right hip bursectomy; gluteal tendon repair;  Surgeon: [name];  Location: [facility];  Service: Orthopedics;  Laterality: Right;  50mn  ? right knee replacement   2011  ? TONSILLECTOMY AND ADENOIDECTOMY    ? TOTAL KNEE ARTHROPLASTY Left 09/19/2016  ? Procedure: LEFT TOTAL KNEE ARTHROPLASTY;  Surgeon: [name];  Location: [facility];  Service: Orthopedics;  Laterality: Left;  requests 56ms ?with abductor block  ? ? ?Social History:  ?Denies any smoking, alcohol use or illicit drug use.  Lives by herself.  Uses a cane to ambulate. ? ?Family History:  ?Family History  ?Problem Relation Age of Onset  ? Stroke Mother   ? Cancer Father   ?     colon  ? Diabetes Father   ? Breast cancer Maternal Aunt   ? Breast cancer Maternal Grandmother   ?  ? ?Review of Systems - History obtained from the patient ?General ROS: positive for  - fatigue ?Psychological ROS: negative ?Ophthalmic  ROS: negative ?ENT ROS: negative ?Allergy and Immunology ROS: negative ?Hematological and Lymphatic ROS: negative ?Endocrine ROS: negative ?Respiratory ROS: no cough, shortness of breath, or wheezing ?Cardiovascular ROS: no chest pain or dyspnea on exertion ?Gastrointestinal ROS: As in HPI ?Genito-Urinary ROS: no dysuria, trouble voiding, or hematuria ?Musculoskeletal ROS: positive for -back pain ?Neurological ROS: no TIA or stroke symptoms ?Dermatological ROS: negative ? ? ?Physical Examination: ?Vitals:  ? 10/01/21 0529 10/01/21 0753 10/01/21 1127 10/01/21 1604  ?BP: (!) 145/60 (!) 130/50 104/90 (!) 130/53  ?Pulse: 92 80 89 94  ?Resp: '16 18 18 17  ' ?Temp: 98.5 ?F (36.9 ?C) 98 ?F (36.7 ?C) 97.9 ?F (36.6 ?C) 98.4 ?F (36.9 ?C)  ?TempSrc: Oral Oral    ?SpO2: 90% 94% 91% 92%  ?Weight:      ?Height:      ? ? ?General appearance: alert, cooperative, appears  stated age, and no distress ?Head: Normocephalic, without obvious abnormality, atraumatic ?Eyes: conjunctivae/corneas clear. PERRL, EOM's intact.  ?Resp: clear to auscultation bilaterally ?Cardio: S1-S2 is normal regular.  Systolic murmur appreciated over the precordium. ?GI: Abdomen is soft.  Mildly tender in the right lower quadrant without any rebound rigidity or guarding.  No masses organomegaly.  Bowel sounds present and normal. ?Extremities: extremities normal, atraumatic, no cyanosis or edema ?Pulses: 2+ and symmetric ?Skin: Skin color, texture, turgor normal. No rashes or lesions ?Lymph nodes: Cervical, supraclavicular, and axillary nodes normal. ?Neurologic: No focal neurological deficits ? ?Laboratory Data: ?Results for orders placed or performed during the hospital encounter of 09/28/21 (from the past 48 hour(s))  ?CBC     Status: Abnormal  ? Collection Time: 10/01/21 10:42 AM  ?Result Value Ref Range  ? WBC 11.6 (H) 4.0 - 10.5 K/uL  ? RBC 2.87 (L) 3.87 - 5.11 MIL/uL  ? Hemoglobin 8.7 (L) 12.0 - 15.0 g/dL  ? HCT 26.3 (L) 36.0 - 46.0 %  ? MCV 91.6 80.0 -  100.0 fL  ? MCH 30.3 26.0 - 34.0 pg  ? MCHC 33.1 30.0 - 36.0 g/dL  ? RDW 13.4 11.5 - 15.5 %  ? Platelets 182 150 - 400 K/uL  ? nRBC 0.0 0.0 - 0.2 %  ?  Comment: Performed at [facility], [place] [address]

## 2021-10-01 NOTE — Progress Notes (Signed)
Patient ID: [name], female   DOB: [birth year], 81 y.o.   MRN: [id number] ?Vital signs are stable however patient still having difficulty with nausea.  She has been receiving some Zofran.  Her postoperative potassium is 3.1.  Hemoglobin was 26.8.  She does not feel much energy and nausea is quite bothersome.  P.o. intake appears poor.  We will recheck a CBC and a be met.  I will asked the hospitalist to see her. ? ?She lives alone and clearly will not be able to take care of herself at this time.  I believe that she would be a good candidate for inpatient rehabilitation if her energy level was just a bit better so that she could participate fully.  We will see what we can do to get her to that goal. ?

## 2021-10-02 DIAGNOSIS — M48062 Spinal stenosis, lumbar region with neurogenic claudication: Principal | ICD-10-CM

## 2021-10-02 LAB — COMPREHENSIVE METABOLIC PANEL
ALT: 14 U/L (ref 0–44)
AST: 34 U/L (ref 15–41)
Albumin: 2.4 g/dL — ABNORMAL LOW (ref 3.5–5.0)
Alkaline Phosphatase: 100 U/L (ref 38–126)
Anion gap: 7 (ref 5–15)
BUN: 5 mg/dL — ABNORMAL LOW (ref 8–23)
CO2: 29 mmol/L (ref 22–32)
Calcium: 8 mg/dL — ABNORMAL LOW (ref 8.9–10.3)
Chloride: 98 mmol/L (ref 98–111)
Creatinine, Ser: 0.67 mg/dL (ref 0.44–1.00)
GFR, Estimated: >60 mL/min (ref >60)
Glucose, Bld: 102 mg/dL — ABNORMAL HIGH (ref 70–99)
Potassium: 3.6 mmol/L (ref 3.5–5.1)
Sodium: 134 mmol/L — ABNORMAL LOW (ref 135–145)
Total Bilirubin: 0.5 mg/dL (ref 0.3–1.2)
Total Protein: 5.3 g/dL — ABNORMAL LOW (ref 6.5–8.1)

## 2021-10-02 LAB — CBC
HCT: 26.1 % — ABNORMAL LOW (ref 36.0–46.0)
Hemoglobin: 8.6 g/dL — ABNORMAL LOW (ref 12.0–15.0)
MCH: 30.2 pg (ref 26.0–34.0)
MCHC: 33 g/dL (ref 30.0–36.0)
MCV: 91.6 fL (ref 80.0–100.0)
Platelets: 223 10*3/uL (ref 150–400)
RBC: 2.85 MIL/uL — ABNORMAL LOW (ref 3.87–5.11)
RDW: 13.2 % (ref 11.5–15.5)
WBC: 9.3 10*3/uL (ref 4.0–10.5)
nRBC: 0 % (ref 0.0–0.2)

## 2021-10-02 LAB — IRON AND TIBC
Iron: 19 ug/dL — ABNORMAL LOW (ref 28–170)
Saturation Ratios: 7 % — ABNORMAL LOW (ref 10.4–31.8)
TIBC: 269 ug/dL (ref 250–450)
UIBC: 250 ug/dL

## 2021-10-02 LAB — RETICULOCYTES
Immature Retic Fract: 31.5 % — ABNORMAL HIGH (ref 2.3–15.9)
RBC.: 2.92 MIL/uL — ABNORMAL LOW (ref 3.87–5.11)
Retic Count, Absolute: 64.5 10*3/uL (ref 19.0–186.0)
Retic Ct Pct: 2.2 % (ref 0.4–3.1)

## 2021-10-02 LAB — VITAMIN B12: Vitamin B-12: 489 pg/mL (ref 180–914)

## 2021-10-02 LAB — FOLATE: Folate: 18.6 ng/mL (ref >5.9)

## 2021-10-02 LAB — FERRITIN: Ferritin: 133 ng/mL (ref 11–307)

## 2021-10-02 LAB — MAGNESIUM: Magnesium: 2.5 mg/dL — ABNORMAL HIGH (ref 1.7–2.4)

## 2021-10-02 MED ORDER — POTASSIUM CHLORIDE CRYS ER 20 MEQ PO TBCR
30.0000 meq | EXTENDED_RELEASE_TABLET | Freq: Once | ORAL | Status: AC
  Administered 2021-10-02: 30 meq via ORAL
  Filled 2021-10-02: qty 1

## 2021-10-02 MED ORDER — SODIUM CHLORIDE 0.9 % IV SOLN
250.0000 mg | Freq: Every day | INTRAVENOUS | Status: AC
  Administered 2021-10-02 – 2021-10-03 (×2): 250 mg via INTRAVENOUS
  Filled 2021-10-02 (×2): qty 20

## 2021-10-02 NOTE — Progress Notes (Signed)
Mobility Specialist Progress Note  ? ? 10/02/21 1227  ?Mobility  ?Activity Dangled on edge of bed ?(exercises)  ?Level of Assistance Moderate assist, patient does 50-74%  ?Activity Response Tolerated well  ?$Mobility charge 1 Mobility  ? ?Pt received declining ambulation but agreeable to exercises. C/o being tired from being up in the chair. Completed 2 sets of 5x/leg extensions and marches. Left in bed with call bell in reach.  ? ?[name] ?Mobility Specialist  ?Primary: 5N M.S. Phone: [phone] ?Secondary: 6N M.S. Phone: [phone] ?  ?

## 2021-10-02 NOTE — Progress Notes (Signed)
?PROGRESS NOTE ? ? ? ?[name]  AOZ:[id number] DOB: 1941/03/25 DOA: 09/28/2021 ?PCP: Geoffry Paradise, MD  ? ? ?Brief Narrative:  ? ?[name] is a 81 year old female with past medical history significant for essential hypertension, hyperlipidemia, chronic back issues who presented to [facility] for severe back pain secondary to severe spinal stenosis and underwent posterior decompression and discectomy of T12-L1 along with posterior lumbar interbody fusion on 09/28/2021.  Patient has been complaining of nausea with fatigue over the last 48 hours, no vomiting reported.  Denies chest pain, shortness of breath, abdominal pain, no dysuria or increased urinary frequency.  Laboratory evaluation revealed hypokalemia.  No bowel movement reported since Monday.  Hospitalist service consulted by neurosurgery service for evaluation of nausea, hypokalemia. ? ?Assessment & Plan: ?  ?Nausea ?Constipation ?Patient complaining of nausea, denies vomiting.  Continues with poor oral intake.  No bowel movement reported since 4/24.  Abdominal x-ray with no evidence of bowel obstruction or free air; but notable for mild stool burden. ?--Senokot-S 2 tablets BID ?--MiraLAX daily as needed ? ?Hypokalemia ?Potassium 3.1 on 4/26, dropped to 2.8 on 4/28.  Aggressively repleted with oral and IV potassium. ?--Potassium 3.6 and magnesium 2.5 this morning, will give an additional 30 meq Kdur today ?--Repeat electrolytes in a.m. ? ?Iron deficiency anemia ?Hemoglobin 9.2 on 4/26, decreased to 8.6 today.  Anemia panel with iron 19, TIBC 269, ferritin 133, folate 18.6, vitamin B12 49. ?--IV iron daily x2 ? ?Severe spinal stenosis ?Underwent posterior decompression and discectomy of T12-L1 with posterior lumbar interbody fusion on 09/28/2021. ?--Continue PT OT efforts while inpatient, pending insurance authorization for CIR ?--Further per neurosurgery ? ?Essential hypertension ?--Amlodipine 5 mg p.o. daily ?--Enalapril 20 mg p.o. twice  daily ?--Bisacodyl suppository 10 mg daily PRN ? ?Depression/anxiety: Sertraline 50 mg p.o. daily ? ?Hyperlipidemia ?--Pravastatin 20 mg p.o. daily ? ? ? ?DVT prophylaxis: enoxaparin (LOVENOX) injection 40 mg Start: 10/01/21 2200 ?SCD's Start: 09/28/21 1513 ? ?  Code Status: Full Code ?Family Communication: None present at bedside ? ?Disposition Plan:  ?Level of care: Med-Surg ?Status is: Inpatient ?Remains inpatient appropriate because: Pending insurance authorization for CIR, disposition per primary, neurosurgery ?  ? ?Subjective: ?Patient seen examined bedside, resting comfortably.  Continues with mild nausea.  Reports she ate some potatoes yesterday.  No other specific questions or concerns at this time.  Discussed with patient, anemia panel with low iron and will infuse IV iron today.  Still reports no bowel movement, started on Senokot last night with as needed MiraLAX and bisacodyl suppositories.  Hypokalemia improved.  Denies headache, no dizziness, no chest pain, no shortness of breath, no vomiting/diarrhea, no weakness, no paresthesias.  No acute events overnight per nursing staff. ? ?Objective: ?Vitals:  ? 10/01/21 2027 10/01/21 2324 10/02/21 0321 10/02/21 0805  ?BP: (!) 150/60 (!) 124/51 (!) 149/63 (!) 146/62  ?Pulse: 87 88 90 89  ?Resp: 16 16 16 18   ?Temp: 99 ?F (37.2 ?C) 98.3 ?F (36.8 ?C) 98.3 ?F (36.8 ?C) 98.1 ?F (36.7 ?C)  ?TempSrc:    Oral  ?SpO2: 91% 92% 98% 94%  ?Weight:      ?Height:      ? ? ?Intake/Output Summary (Last 24 hours) at 10/02/2021 10/04/2021 ?Last data filed at 10/02/2021 0815 ?Gross per 24 hour  ?Intake 600 ml  ?Output 1800 ml  ?Net -1200 ml  ? ?Filed Weights  ? 09/28/21 0616  ?Weight: 83.9 kg  ? ? ?Examination: ? ?Physical Exam: ?GEN: NAD, alert and oriented  x 3, wd/wn ?HEENT: NCAT, PERRL, EOMI, sclera clear, MMM ?PULM: CTAB w/o wheezes/crackles, normal respiratory effort, on 2 L nasal cannula ?CV: RRR w/o M/G/R ?GI: abd soft, NTND, NABS, no R/G/M ?MSK: no peripheral edema, muscle  strength globally intact 5/5 bilateral upper/lower extremities ?NEURO: CN II-XII intact, no focal deficits, sensation to light touch intact ?PSYCH: normal mood/affect ?Integumentary: dry/intact, no rashes or wounds ? ? ? ?Data Reviewed: I have personally reviewed following labs and imaging studies ? ?CBC: ?Recent Labs  ?Lab 09/27/21 ?1308 09/29/21 ?0313 10/01/21 ?1042 10/02/21 ?0215  ?WBC 5.8 12.6* 11.6* 9.3  ?HGB 12.7 9.2* 8.7* 8.6*  ?HCT 38.5 26.6* 26.3* 26.1*  ?MCV 91.9 89.9 91.6 91.6  ?PLT 235 196 182 223  ? ?Basic Metabolic Panel: ?Recent Labs  ?Lab 09/27/21 ?1308 09/29/21 ?0313 10/01/21 ?1042 10/01/21 ?1632 10/02/21 ?0215  ?NA 140 137 134*  --  134*  ?K 3.5 3.1* 2.8*  --  3.6  ?CL 106 105 97*  --  98  ?CO2 27 26 28   --  29  ?GLUCOSE 102* 125* 125*  --  102*  ?BUN 9 10 11   --  5*  ?CREATININE 0.72 0.68 0.77  --  0.67  ?CALCIUM 9.2 8.1* 8.2*  --  8.0*  ?MG  --   --   --  1.8 2.5*  ? ?GFR: ?Estimated Creatinine Clearance: 62.4 mL/min (by C-G formula based on SCr of 0.67 mg/dL). ?Liver Function Tests: ?Recent Labs  ?Lab 10/01/21 ?1632 10/02/21 ?0215  ?AST 52* 34  ?ALT 20 14  ?ALKPHOS 109 100  ?BILITOT 0.7 0.5  ?PROT 5.6* 5.3*  ?ALBUMIN 2.5* 2.4*  ? ?No results for input(s): LIPASE, AMYLASE in the last 168 hours. ?No results for input(s): AMMONIA in the last 168 hours. ?Coagulation Profile: ?No results for input(s): INR, PROTIME in the last 168 hours. ?Cardiac Enzymes: ?No results for input(s): CKTOTAL, CKMB, CKMBINDEX, TROPONINI in the last 168 hours. ?BNP (last 3 results) ?No results for input(s): PROBNP in the last 8760 hours. ?HbA1C: ?No results for input(s): HGBA1C in the last 72 hours. ?CBG: ?No results for input(s): GLUCAP in the last 168 hours. ?Lipid Profile: ?No results for input(s): CHOL, HDL, LDLCALC, TRIG, CHOLHDL, LDLDIRECT in the last 72 hours. ?Thyroid Function Tests: ?No results for input(s): TSH, T4TOTAL, FREET4, T3FREE, THYROIDAB in the last 72 hours. ?Anemia Panel: ?Recent Labs  ?   10/02/21 ?0215  ?VITAMINB12 489  ?FOLATE 18.6  ?FERRITIN 133  ?TIBC 269  ?IRON 19*  ?RETICCTPCT 2.2  ? ?Sepsis Labs: ?No results for input(s): PROCALCITON, LATICACIDVEN in the last 168 hours. ? ?Recent Results (from the past 240 hour(s))  ?Surgical pcr screen     Status: None  ? Collection Time: 09/27/21  1:08 PM  ? Specimen: Nasal Mucosa; Nasal Swab  ?Result Value Ref Range Status  ? MRSA, PCR NEGATIVE NEGATIVE Final  ? Staphylococcus aureus NEGATIVE NEGATIVE Final  ?  Comment: (NOTE) ?The Xpert SA Assay (FDA approved for NASAL specimens in patients 9722 ?years of age and older), is one component of a comprehensive ?surveillance program. It is not intended to diagnose infection nor to ?guide or monitor treatment. ?Performed at [facility], [address] [place], [place] ?[id number] ?  ?  ? ? ? ? ? ?Radiology Studies: ?DG Abd Portable 2V ? ?Result Date: 10/01/2021 ?CLINICAL DATA:  Nausea and vomiting. EXAM: PORTABLE ABDOMEN - 2 VIEW COMPARISON:  February 12, 2021 FINDINGS: There is no evidence of bowel dilatation. There is  no evidence of free air. A mild stool burden is seen. No radio-opaque calculi or other significant radiographic abnormality is seen. Radiopaque surgical clips are seen within the right upper quadrant. Extensive postoperative changes are seen throughout the thoracolumbar spine, with interval lower thoracic spine surgical intervention noted since the prior study. IMPRESSION: No evidence of bowel obstruction or free air. Electronically Signed   By: Aram Candela M.D.   On: 10/01/2021 21:34   ? ? ? ? ? ?Scheduled Meds: ? amLODipine  5 mg Oral QHS  ? donepezil  5 mg Oral QHS  ? enalapril  20 mg Oral BID  ? enoxaparin (LOVENOX) injection  40 mg Subcutaneous Q24H  ? estradiol  0.5 mg Oral Daily  ? pantoprazole  40 mg Oral Q1200  ? polyethylene glycol  17 g Oral BID  ? pravastatin  20 mg Oral QHS  ? progesterone  100 mg Oral QHS  ? senna-docusate  2 tablet Oral BID  ? sertraline  50 mg Oral Daily   ? sodium chloride flush  3 mL Intravenous Q12H  ? ?Continuous Infusions: ? sodium chloride    ?  ceFAZolin (ANCEF) IV 2 g (10/02/21 0913)  ? ferric gluconate (FERRLECIT) IVPB    ? methocarbamol (ROBAXIN) IV    ? ? ? LOS: 4 days  ? ? ?T

## 2021-10-02 NOTE — Progress Notes (Signed)
Subjective: ?Patient reports  feeling better condition of back pain and weakness in her arms and hands baseline no pain in her legs. ? ?Objective: ?Vital signs in last 24 hours: ?Temp:  [97.9 ?F (36.6 ?C)-99 ?F (37.2 ?C)] 98.3 ?F (36.8 ?C) (04/29 0321) ?Pulse Rate:  [80-94] 90 (04/29 0321) ?Resp:  [16-18] 16 (04/29 0321) ?BP: (104-150)/(50-90) 149/63 (04/29 0321) ?SpO2:  [91 %-98 %] 98 % (04/29 0321) ? ?Intake/Output from previous day: ?04/28 0701 - 04/29 0700 ?In: 600 [P.O.:600] ?Out: 600 [Urine:600] ?Intake/Output this shift: ?No intake/output data recorded. ? ?Awake alert generalized weakness but appears to be nonfocal lower extremities do appear to be 5 out of 5 incision appears to be clean dry and intact ? ?Lab Results: ?Recent Labs  ?  10/01/21 ?1042 10/02/21 ?0215  ?WBC 11.6* 9.3  ?HGB 8.7* 8.6*  ?HCT 26.3* 26.1*  ?PLT 182 223  ? ?BMET ?Recent Labs  ?  10/01/21 ?1042 10/02/21 ?0215  ?NA 134* 134*  ?K 2.8* 3.6  ?CL 97* 98  ?CO2 28 29  ?GLUCOSE 125* 102*  ?BUN 11 5*  ?CREATININE 0.77 0.67  ?CALCIUM 8.2* 8.0*  ? ? ?Studies/Results: ?DG Abd Portable 2V ? ?Result Date: 10/01/2021 ?CLINICAL DATA:  Nausea and vomiting. EXAM: PORTABLE ABDOMEN - 2 VIEW COMPARISON:  February 12, 2021 FINDINGS: There is no evidence of bowel dilatation. There is no evidence of free air. A mild stool burden is seen. No radio-opaque calculi or other significant radiographic abnormality is seen. Radiopaque surgical clips are seen within the right upper quadrant. Extensive postoperative changes are seen throughout the thoracolumbar spine, with interval lower thoracic spine surgical intervention noted since the prior study. IMPRESSION: No evidence of bowel obstruction or free air. Electronically Signed   By: Aram Candela M.D.   On: 10/01/2021 21:34   ? ?Assessment/Plan: ?Patient is now postop day 4 from T10-L2 fusion making progress but certainly looks like she will need inpatient rehab continue to work on physical Occupational Therapy  and work on placement ? LOS: 4 days  ? ? ? ?Mariam Dollar ?10/02/2021, 7:49 AM ? ? ? ? ?

## 2021-10-02 NOTE — Progress Notes (Signed)
Physical Therapy Treatment ?Patient Details ?Name: [name] ?MRN: [id number] ?DOB: 12-Feb-1941 ?Today's Date: 10/02/2021 ? ? ?History of Present Illness Pt is an 81 y.o. 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 PMH includes 3x prior back sxs (most recently 08/2019), bilateral TKA (2011, 2018), HTN, arthritis, anxiety. ? ?  ?PT Comments  ? ? Pt reports she is feeling poorly (back pain, stomach pain, nausea, and headache) but is agreeable to participate with therapy. Pt ambulated in room on RA with SpO2 remaining >93%. Urine incontinence dripping onto the floor during ambulation despite mesh panties and pad present. Will continue to follow acutely for mobility progression.  ?  ?Recommendations for follow up therapy are one component of a multi-disciplinary discharge planning process, led by the attending physician.  Recommendations may be updated based on patient status, additional functional criteria and insurance authorization. ? ?Follow Up Recommendations ? Acute inpatient rehab (3hours/day) ?  ?  ?Assistance Recommended at Discharge Intermittent Supervision/Assistance  ?Patient can return home with the following A little help with walking and/or transfers;A little help with bathing/dressing/bathroom;Assistance with cooking/housework;Direct supervision/assist for medications management;Direct supervision/assist for financial management;Assist for transportation;Help with stairs or ramp for entrance ?  ?Equipment Recommendations ? None recommended by PT  ?  ?Recommendations for Other Services Rehab consult ? ? ?  ?Precautions / Restrictions Precautions ?Precautions: Back;Fall ?Precaution Booklet Issued: No ?Precaution Comments: back brace ?Required Braces or Orthoses: Spinal Brace ?Spinal Brace: Lumbar corset ?Restrictions ?Weight Bearing Restrictions: No  ?  ? ?Mobility ? Bed Mobility ?Overal bed mobility: Needs  Assistance ?Bed Mobility: Rolling, Supine to Sit ?Rolling: Min assist ?Sidelying to sit: Mod assist ?  ?  ?  ?General bed mobility comments: pt able to roll with HOB elevated and use of bedrails. Initiated trunk elevation but unable to progress to fully upright without mod A. Assist to progress hips forward to EOB. ?  ? ?Transfers ?Overall transfer level: Needs assistance ?Equipment used: Rolling walker (2 wheels) ?Transfers: Sit to/from Stand ?Sit to Stand: Min assist ?  ?  ?  ?  ?  ?General transfer comment: increased time and effort with cues for hand placement. ?  ? ?Ambulation/Gait ?Ambulation/Gait assistance: Min guard ?Gait Distance (Feet): 30 Feet ?Assistive device: Rolling walker (2 wheels) ?Gait Pattern/deviations: Step-through pattern, Decreased stride length ?Gait velocity: reduced ?Gait velocity interpretation: <1.31 ft/sec, indicative of household ambulator ?  ?General Gait Details: pt with slowed step-through gait, very short stride length. Pt reports mild SOB on RA ? ? ?Stairs ?  ?  ?  ?  ?  ? ? ?Wheelchair Mobility ?  ? ?Modified Rankin (Stroke Patients Only) ?  ? ? ?  ?Balance Overall balance assessment: Needs assistance ?Sitting-balance support: Bilateral upper extremity supported, Feet supported ?Sitting balance-Leahy Scale: Poor ?Sitting balance - Comments: strong L lean ?  ?Standing balance support: Bilateral upper extremity supported, Reliant on assistive device for balance ?Standing balance-Leahy Scale: Poor ?  ?  ?  ?  ?  ?  ?  ?  ?  ?  ?  ?  ?  ? ?  ?Cognition Arousal/Alertness: Awake/alert ?Behavior During Therapy: [facility] for tasks assessed/performed ?Overall Cognitive Status: Within Functional Limits for tasks assessed ?  ?  ?  ?  ?  ?  ?  ?  ?  ?  ?  ?  ?  ?  ?  ?  ?  ?  ?  ? ?  ?  Exercises   ? ?  ?General Comments General comments (skin integrity, edema, etc.): Session performed on RA with SpO2 remaining >93% ?  ?  ? ?Pertinent Vitals/Pain Pain Assessment ?Pain Assessment: Faces ?Faces Pain  Scale: Hurts little more ?Pain Location: back ?Pain Descriptors / Indicators: Grimacing ?Pain Intervention(s): Monitored during session, Limited activity within patient's tolerance, Repositioned  ? ? ?Home Living   ?  ?  ?  ?  ?  ?  ?  ?  ?  ?   ?  ?Prior Function    ?  ?  ?   ? ?PT Goals (current goals can now be found in the care plan section) Acute Rehab PT Goals ?Patient Stated Goal: decreased pain, return home ?PT Goal Formulation: With patient ?Time For Goal Achievement: 10/12/21 ?Potential to Achieve Goals: Good ?Progress towards PT goals: Progressing toward goals ? ?  ?Frequency ? ? ? Min 5X/week ? ? ? ?  ?PT Plan Current plan remains appropriate  ? ? ?Co-evaluation   ?  ?  ?  ?  ? ?  ?AM-PAC PT "6 Clicks" Mobility   ?Outcome Measure ? Help needed turning from your back to your side while in a flat bed without using bedrails?: A Little ?Help needed moving from lying on your back to sitting on the side of a flat bed without using bedrails?: A Lot ?Help needed moving to and from a bed to a chair (including a wheelchair)?: A Little ?Help needed standing up from a chair using your arms (e.g., wheelchair or bedside chair)?: A Little ?Help needed to walk in hospital room?: A Little ?Help needed climbing 3-5 steps with a railing? : Total ?6 Click Score: 15 ? ?  ?End of Session Equipment Utilized During Treatment: Back brace ?Activity Tolerance: Patient tolerated treatment well ?Patient left: with call bell/phone within reach;in chair ?Nurse Communication: Mobility status;Other (comment) (O2 sats with mobility) ?PT Visit Diagnosis: Other abnormalities of gait and mobility (R26.89);Pain ?Pain - part of body:  (back) ?  ? ? ?Time: [id number] ?PT Time Calculation (min) (ACUTE ONLY): 19 min ? ?Charges:  $Gait Training: 8-22 mins          ?          ? ?Benjiman Core, PTA ?Acute Rehab ? ?Allena Katz ?10/02/2021, 2:02 PM ? ?

## 2021-10-03 DIAGNOSIS — M48062 Spinal stenosis, lumbar region with neurogenic claudication: Secondary | ICD-10-CM | POA: Diagnosis not present

## 2021-10-03 LAB — BASIC METABOLIC PANEL
Anion gap: 10 (ref 5–15)
BUN: 5 mg/dL — ABNORMAL LOW (ref 8–23)
CO2: 28 mmol/L (ref 22–32)
Calcium: 8.3 mg/dL — ABNORMAL LOW (ref 8.9–10.3)
Chloride: 96 mmol/L — ABNORMAL LOW (ref 98–111)
Creatinine, Ser: 0.57 mg/dL (ref 0.44–1.00)
GFR, Estimated: >60 mL/min (ref >60)
Glucose, Bld: 113 mg/dL — ABNORMAL HIGH (ref 70–99)
Potassium: 3.6 mmol/L (ref 3.5–5.1)
Sodium: 134 mmol/L — ABNORMAL LOW (ref 135–145)

## 2021-10-03 LAB — CBC
HCT: 26.2 % — ABNORMAL LOW (ref 36.0–46.0)
Hemoglobin: 9.1 g/dL — ABNORMAL LOW (ref 12.0–15.0)
MCH: 31 pg (ref 26.0–34.0)
MCHC: 34.7 g/dL (ref 30.0–36.0)
MCV: 89.1 fL (ref 80.0–100.0)
Platelets: 264 10*3/uL (ref 150–400)
RBC: 2.94 MIL/uL — ABNORMAL LOW (ref 3.87–5.11)
RDW: 13.3 % (ref 11.5–15.5)
WBC: 8.5 10*3/uL (ref 4.0–10.5)
nRBC: 0 % (ref 0.0–0.2)

## 2021-10-03 MED ORDER — POTASSIUM CHLORIDE CRYS ER 20 MEQ PO TBCR
40.0000 meq | EXTENDED_RELEASE_TABLET | Freq: Once | ORAL | Status: AC
  Administered 2021-10-03: 40 meq via ORAL
  Filled 2021-10-03: qty 2

## 2021-10-03 MED ORDER — BISACODYL 10 MG RE SUPP
10.0000 mg | Freq: Once | RECTAL | Status: AC
  Administered 2021-10-03: 10 mg via RECTAL
  Filled 2021-10-03: qty 1

## 2021-10-03 NOTE — Progress Notes (Signed)
NEUROSURGERY PROGRESS NOTE ? ?Doing well. Complains of appropriate beck soreness. ?No leg pain ?No numbness, tingling or weakness ?Ambulating and voiding well ?Good strength and sensation ?Incision CDI ? ?Temp:  [97.5 ?F (36.4 ?C)-98.2 ?F (36.8 ?C)] 98.2 ?F (36.8 ?C) (04/30 [id number]) ?Pulse Rate:  [81-87] 82 (04/30 0737) ?Resp:  [16-18] 16 (04/30 0737) ?BP: (140-175)/(57-84) 140/62 (04/30 0737) ?SpO2:  [91 %-98 %] 92 % (04/30 0737) ? ?Plan: ?S/p thoracolumbar fusion, awaiting CIR placement. Continue therapies for now.  ? ?Eleonore Chiquito, NP ?10/03/2021 ?8:30 AM ?  ?

## 2021-10-03 NOTE — Progress Notes (Signed)
?PROGRESS NOTE ? ? ? ?Kristen VANDERWEELE  Blake:[id number] DOB: July 11, 1940 DOA: 09/28/2021 ?PCP: Geoffry Paradise, MD  ? ? ?Brief Narrative:  ? ?[name] is a 81 year old female with past medical history significant for essential hypertension, hyperlipidemia, chronic back issues who presented to [facility] for severe back pain secondary to severe spinal stenosis and underwent posterior decompression and discectomy of T12-L1 along with posterior lumbar interbody fusion on 09/28/2021.  Patient has been complaining of nausea with fatigue over the last 48 hours, no vomiting reported.  Denies chest pain, shortness of breath, abdominal pain, no dysuria or increased urinary frequency.  Laboratory evaluation revealed hypokalemia.  No bowel movement reported since Monday.  Hospitalist service consulted by neurosurgery service for evaluation of nausea, hypokalemia. ? ?Assessment & Plan: ?  ?Nausea ?Constipation ?Patient complaining of nausea, denies vomiting.  Continues with poor oral intake.  No bowel movement reported since 4/24.  Abdominal x-ray with no evidence of bowel obstruction or free air; but notable for mild stool burden. ?--Senokot-S 2 tablets BID ?--MiraLAX BID ?--Bisacodyl suppository x1 today ?--Needs increased mobility ? ?Hypokalemia ?Potassium 3.6 this morning, will replete. ?--Repeat electrolytes in a.m. ? ?Iron deficiency anemia ?Hemoglobin 9.2 on 4/26, decreased to 8.6 today.  Anemia panel with iron 19, TIBC 269, ferritin 133, folate 18.6, vitamin B12 49. ?--IV iron daily x 2 ? ?Severe spinal stenosis ?Underwent posterior decompression and discectomy of T12-L1 with posterior lumbar interbody fusion on 09/28/2021. ?--Continue PT OT efforts while inpatient, pending insurance authorization for CIR ?--Further per neurosurgery ? ?Essential hypertension ?--Amlodipine 5 mg p.o. daily ?--Enalapril 20 mg p.o. twice daily ? ?Depression/anxiety: Sertraline 50 mg p.o. daily ? ?Hyperlipidemia ?--Pravastatin 20 mg p.o.  daily ? ? ? ?DVT prophylaxis: enoxaparin (LOVENOX) injection 40 mg Start: 10/01/21 2200 ?SCD's Start: 09/28/21 1513 ? ?  Code Status: Full Code ?Family Communication: None present at bedside ? ?Disposition Plan:  ?Level of care: Med-Surg ?Status is: Inpatient ?Remains inpatient appropriate because: Pending insurance authorization for CIR, disposition per primary, neurosurgery ?  ? ?Subjective: ?Patient seen examined bedside, resting comfortably.  Lying in bed.  Continues to report poor appetite, had "some things to drink yesterday and some toast".  Awaiting insurance authorization for CIR.  No reported bowel movement yesterday and discussed will give a suppository today.  Denies headache, no dizziness, no chest pain, no shortness of breath, no vomiting/diarrhea, no weakness, no paresthesias.  No acute events overnight per nursing staff. ? ?Objective: ?Vitals:  ? 10/02/21 2338 10/03/21 0341 10/03/21 0723 10/03/21 0737  ?BP: (!) 166/63 (!) 175/69 (!) 144/57 140/62  ?Pulse: 84 81  82  ?Resp:    16  ?Temp: 98.1 ?F (36.7 ?C) (!) 97.5 ?F (36.4 ?C)  98.2 ?F (36.8 ?C)  ?TempSrc: Oral   Oral  ?SpO2: 91% 93%  92%  ?Weight:      ?Height:      ? ? ?Intake/Output Summary (Last 24 hours) at 10/03/2021 1131 ?Last data filed at 10/03/2021 9562 ?Gross per 24 hour  ?Intake 1173 ml  ?Output 3100 ml  ?Net -1927 ml  ? ?Filed Weights  ? 09/28/21 0616  ?Weight: 83.9 kg  ? ? ?Examination: ? ?Physical Exam: ?GEN: NAD, alert and oriented x 3, wd/wn ?HEENT: NCAT, PERRL, EOMI, sclera clear, MMM ?PULM: CTAB w/o wheezes/crackles, normal respiratory effort, on 2 L nasal cannula ?CV: RRR w/o M/G/R ?GI: abd soft, NTND, NABS, no R/G/M ?MSK: no peripheral edema, muscle strength globally intact 5/5 bilateral upper/lower extremities ?NEURO: CN II-XII intact,  no focal deficits, sensation to light touch intact ?PSYCH: normal mood/affect ?Integumentary: dry/intact, no rashes or wounds ? ? ? ?Data Reviewed: I have personally reviewed following labs and  imaging studies ? ?CBC: ?Recent Labs  ?Lab 09/27/21 ?1308 09/29/21 ?0313 10/01/21 ?1042 10/02/21 ?0215 10/03/21 ?0145  ?WBC 5.8 12.6* 11.6* 9.3 8.5  ?HGB 12.7 9.2* 8.7* 8.6* 9.1*  ?HCT 38.5 26.6* 26.3* 26.1* 26.2*  ?MCV 91.9 89.9 91.6 91.6 89.1  ?PLT 235 196 182 223 264  ? ?Basic Metabolic Panel: ?Recent Labs  ?Lab 09/27/21 ?1308 09/29/21 ?0313 10/01/21 ?1042 10/01/21 ?1632 10/02/21 ?0215 10/03/21 ?0145  ?NA 140 137 134*  --  134* 134*  ?K 3.5 3.1* 2.8*  --  3.6 3.6  ?CL 106 105 97*  --  98 96*  ?CO2 27 26 28   --  29 28  ?GLUCOSE 102* 125* 125*  --  102* 113*  ?BUN 9 10 11   --  5* 5*  ?CREATININE 0.72 0.68 0.77  --  0.67 0.57  ?CALCIUM 9.2 8.1* 8.2*  --  8.0* 8.3*  ?MG  --   --   --  1.8 2.5*  --   ? ?GFR: ?Estimated Creatinine Clearance: 62.4 mL/min (by C-G formula based on SCr of 0.57 mg/dL). ?Liver Function Tests: ?Recent Labs  ?Lab 10/01/21 ?1632 10/02/21 ?0215  ?AST 52* 34  ?ALT 20 14  ?ALKPHOS 109 100  ?BILITOT 0.7 0.5  ?PROT 5.6* 5.3*  ?ALBUMIN 2.5* 2.4*  ? ?No results for input(s): LIPASE, AMYLASE in the last 168 hours. ?No results for input(s): AMMONIA in the last 168 hours. ?Coagulation Profile: ?No results for input(s): INR, PROTIME in the last 168 hours. ?Cardiac Enzymes: ?No results for input(s): CKTOTAL, CKMB, CKMBINDEX, TROPONINI in the last 168 hours. ?BNP (last 3 results) ?No results for input(s): PROBNP in the last 8760 hours. ?HbA1C: ?No results for input(s): HGBA1C in the last 72 hours. ?CBG: ?No results for input(s): GLUCAP in the last 168 hours. ?Lipid Profile: ?No results for input(s): CHOL, HDL, LDLCALC, TRIG, CHOLHDL, LDLDIRECT in the last 72 hours. ?Thyroid Function Tests: ?No results for input(s): TSH, T4TOTAL, FREET4, T3FREE, THYROIDAB in the last 72 hours. ?Anemia Panel: ?Recent Labs  ?  10/02/21 ?0215  ?VITAMINB12 489  ?FOLATE 18.6  ?FERRITIN 133  ?TIBC 269  ?IRON 19*  ?RETICCTPCT 2.2  ? ?Sepsis Labs: ?No results for input(s): PROCALCITON, LATICACIDVEN in the last 168 hours. ? ?Recent  Results (from the past 240 hour(s))  ?Surgical pcr screen     Status: None  ? Collection Time: 09/27/21  1:08 PM  ? Specimen: Nasal Mucosa; Nasal Swab  ?Result Value Ref Range Status  ? MRSA, PCR NEGATIVE NEGATIVE Final  ? Staphylococcus aureus NEGATIVE NEGATIVE Final  ?  Comment: (NOTE) ?The Xpert SA Assay (FDA approved for NASAL specimens in patients 45 ?years of age and older), is one component of a comprehensive ?surveillance program. It is not intended to diagnose infection nor to ?guide or monitor treatment. ?Performed at [facility], [address] [place], [address] ?[place] ?  ?  ? ? ? ? ? ?Radiology Studies: ?DG Abd Portable 2V ? ?Result Date: 10/01/2021 ?CLINICAL DATA:  Nausea and vomiting. EXAM: PORTABLE ABDOMEN - 2 VIEW COMPARISON:  February 12, 2021 FINDINGS: There is no evidence of bowel dilatation. There is no evidence of free air. A mild stool burden is seen. No radio-opaque calculi or other significant radiographic abnormality is seen. Radiopaque surgical clips are seen within the right  upper quadrant. Extensive postoperative changes are seen throughout the thoracolumbar spine, with interval lower thoracic spine surgical intervention noted since the prior study. IMPRESSION: No evidence of bowel obstruction or free air. Electronically Signed   By: Aram Candelahaddeus  Houston M.D.   On: 10/01/2021 21:34   ? ? ? ? ? ?Scheduled Meds: ? amLODipine  5 mg Oral QHS  ? bisacodyl  10 mg Rectal Once  ? donepezil  5 mg Oral QHS  ? enalapril  20 mg Oral BID  ? enoxaparin (LOVENOX) injection  40 mg Subcutaneous Q24H  ? estradiol  0.5 mg Oral Daily  ? pantoprazole  40 mg Oral Q1200  ? polyethylene glycol  17 g Oral BID  ? pravastatin  20 mg Oral QHS  ? progesterone  100 mg Oral QHS  ? senna-docusate  2 tablet Oral BID  ? sertraline  50 mg Oral Daily  ? sodium chloride flush  3 mL Intravenous Q12H  ? ?Continuous Infusions: ? sodium chloride    ? ferric gluconate (FERRLECIT) IVPB 250 mg (10/03/21 [id number])  ?  methocarbamol (ROBAXIN) IV    ? ? ? LOS: 5 days  ? ? ?Time spent: 45 minutes spent on chart review, discussion with nursing staff, consultants, updating family and interview/physical exam; more than 50% of that time was spent in c

## 2021-10-03 NOTE — Progress Notes (Signed)
Mobility Specialist Progress Note  ? ? 10/03/21 1648  ?Mobility  ?Activity Ambulated with assistance in room  ?Level of Assistance Minimal assist, patient does 75% or more  ?Assistive Device Front wheel walker  ?Distance Ambulated (ft) 30 ft  ?Activity Response Tolerated fair  ?$Mobility charge 1 Mobility  ? ?Pt received in bed and agreeable. Pt required minG to stand. C/o L foot cramp at door and wanted to turn around. Left in chair with call bell in reach.  ? ?[place] ?Mobility Specialist  ?Primary: 5N M.S. Phone: [phone] ?Secondary: 6N M.S. Phone: [phone] ?  ?

## 2021-10-04 ENCOUNTER — Inpatient Hospital Stay (HOSPITAL_COMMUNITY): Payer: Medicare Other

## 2021-10-04 DIAGNOSIS — M48062 Spinal stenosis, lumbar region with neurogenic claudication: Secondary | ICD-10-CM | POA: Diagnosis not present

## 2021-10-04 LAB — CBC
HCT: 29.8 % — ABNORMAL LOW (ref 36.0–46.0)
Hemoglobin: 9.6 g/dL — ABNORMAL LOW (ref 12.0–15.0)
MCH: 29.8 pg (ref 26.0–34.0)
MCHC: 32.2 g/dL (ref 30.0–36.0)
MCV: 92.5 fL (ref 80.0–100.0)
Platelets: 334 10*3/uL (ref 150–400)
RBC: 3.22 MIL/uL — ABNORMAL LOW (ref 3.87–5.11)
RDW: 13.4 % (ref 11.5–15.5)
WBC: 10 10*3/uL (ref 4.0–10.5)
nRBC: 0.2 % (ref 0.0–0.2)

## 2021-10-04 LAB — BASIC METABOLIC PANEL
Anion gap: 10 (ref 5–15)
BUN: 10 mg/dL (ref 8–23)
CO2: 26 mmol/L (ref 22–32)
Calcium: 8.7 mg/dL — ABNORMAL LOW (ref 8.9–10.3)
Chloride: 98 mmol/L (ref 98–111)
Creatinine, Ser: 0.69 mg/dL (ref 0.44–1.00)
GFR, Estimated: >60 mL/min (ref >60)
Glucose, Bld: 106 mg/dL — ABNORMAL HIGH (ref 70–99)
Potassium: 4.3 mmol/L (ref 3.5–5.1)
Sodium: 134 mmol/L — ABNORMAL LOW (ref 135–145)

## 2021-10-04 LAB — MAGNESIUM: Magnesium: 2.1 mg/dL (ref 1.7–2.4)

## 2021-10-04 MED ORDER — FERROUS SULFATE 325 (65 FE) MG PO TABS
325.0000 mg | ORAL_TABLET | Freq: Every day | ORAL | Status: DC
  Administered 2021-10-04 – 2021-10-06 (×3): 325 mg via ORAL
  Filled 2021-10-04 (×3): qty 1

## 2021-10-04 NOTE — Progress Notes (Signed)
?PROGRESS NOTE ? ? ? ?[name]  [id number] DOB: [birth year] DOA: 09/28/2021 ?PCP: Burnard Bunting, MD  ? ? ?Brief Narrative:  ? ?GRABIELA Blake is a 81 year old female with past medical history significant for essential hypertension, hyperlipidemia, chronic back issues who presented to [facility] for severe back pain secondary to severe spinal stenosis and underwent posterior decompression and discectomy of T12-L1 along with posterior lumbar interbody fusion on 09/28/2021.  Patient has been complaining of nausea with fatigue over the last 48 hours, no vomiting reported.  Denies chest pain, shortness of breath, abdominal pain, no dysuria or increased urinary frequency.  Laboratory evaluation revealed hypokalemia.  No bowel movement reported since Monday.  Hospitalist service consulted by neurosurgery service for evaluation of nausea, hypokalemia. ? ?Assessment & Plan: ?  ?Nausea ?Constipation ?Patient complaining of nausea, denies vomiting.  Continues with poor oral intake.  No bowel movement reported since 4/24.  Abdominal x-ray with no evidence of bowel obstruction or free air; but notable for mild stool burden. ?--BM x 1 yesterday ?--Senokot-S 2 tablets BID ?--MiraLAX BID ?--Bisacodyl suppository as needed ?--Needs increased mobility ? ?Hypokalemia: Solved ?Potassium 3.3 this morning ? ?Iron deficiency anemia ?Hemoglobin 9.2 on 4/26, decreased to low 8.6.  Anemia panel with iron 19, TIBC 269, ferritin 133, folate 18.6, vitamin B12 49. ?--Hgb 9.6 this am ?--s/p IV iron x 2 ?-- Ferrous sulfate 325 mg p.o. daily ? ?Severe spinal stenosis ?Underwent posterior decompression and discectomy of T12-L1 with posterior lumbar interbody fusion on 09/28/2021. ?--Continue PT OT efforts while inpatient, pending insurance authorization for CIR ?--Further per neurosurgery ? ?Essential hypertension ?--Amlodipine 5 mg p.o. daily ?--Enalapril 20 mg p.o. twice daily ? ?Depression/anxiety: Sertraline 50 mg p.o.  daily ? ?Hyperlipidemia ?--Pravastatin 20 mg p.o. daily ? ? ? ?DVT prophylaxis: enoxaparin (LOVENOX) injection 40 mg Start: 10/01/21 2200 ?SCD's Start: 09/28/21 1513 ? ?  Code Status: Full Code ?Family Communication: None present at bedside ? ?Disposition Plan:  ?Level of care: Med-Surg ?Status is: Inpatient ?Remains inpatient appropriate because: Pending insurance authorization for CIR, disposition per primary, neurosurgery ?  ? ?Subjective: ?Patient seen examined bedside, resting comfortably.  Lying in bed.  Continues to report poor appetite, had "some potatoes and other things yesterday".  1 bowel movement yesterday.  Awaiting insurance authorization for CIR.  Denies headache, no dizziness, no chest pain, no shortness of breath, no vomiting/diarrhea, no weakness, no paresthesias.  No acute events overnight per nursing staff.  Did discuss with RN this morning to closely document oral intake today.  Medically stable for discharge to CIR once insurance authorization received. ? ?Objective: ?Vitals:  ? 10/03/21 1955 10/03/21 2324 10/04/21 0358 10/04/21 0700  ?BP: (!) 155/62   137/65  ?Pulse: 82   88  ?Resp:  16 18   ?Temp: 97.9 ?F (36.6 ?C) 98.1 ?F (36.7 ?C) 98.9 ?F (37.2 ?C) 98.3 ?F (36.8 ?C)  ?TempSrc: Oral Oral Oral Oral  ?SpO2: 97%  94% 96%  ?Weight:      ?Height:      ? ? ?Intake/Output Summary (Last 24 hours) at 10/04/2021 1031 ?Last data filed at 10/04/2021 0151 ?Gross per 24 hour  ?Intake 240 ml  ?Output 400 ml  ?Net -160 ml  ? ?Filed Weights  ? 09/28/21 0616  ?Weight: 83.9 kg  ? ? ?Examination: ? ?Physical Exam: ?GEN: NAD, alert and oriented x 3, wd/wn ?HEENT: NCAT, PERRL, EOMI, sclera clear, MMM ?PULM: CTAB w/o wheezes/crackles, normal respiratory effort, on room air ?CV: RRR w/o M/G/R ?GI:  abd soft, NTND, NABS, no R/G/M ?MSK: no peripheral edema, muscle strength globally intact 5/5 bilateral upper/lower extremities ?NEURO: CN II-XII intact, no focal deficits, sensation to light touch intact ?PSYCH: normal  mood/affect ?Integumentary: dry/intact, no rashes or wounds ? ? ? ?Data Reviewed: I have personally reviewed following labs and imaging studies ? ?CBC: ?Recent Labs  ?Lab 09/29/21 ?0313 10/01/21 ?1042 10/02/21 ?0215 10/03/21 ?0145 10/04/21 ?0301  ?WBC 12.6* 11.6* 9.3 8.5 10.0  ?HGB 9.2* 8.7* 8.6* 9.1* 9.6*  ?HCT 26.6* 26.3* 26.1* 26.2* 29.8*  ?MCV 89.9 91.6 91.6 89.1 92.5  ?PLT 196 182 223 264 334  ? ?Basic Metabolic Panel: ?Recent Labs  ?Lab 09/29/21 ?0313 10/01/21 ?1042 10/01/21 ?1632 10/02/21 ?0215 10/03/21 ?0145 10/04/21 ?0301  ?NA 137 134*  --  134* 134* 134*  ?K 3.1* 2.8*  --  3.6 3.6 4.3  ?CL 105 97*  --  98 96* 98  ?CO2 26 28  --  29 28 26   ?GLUCOSE 125* 125*  --  102* 113* 106*  ?BUN 10 11  --  5* 5* 10  ?CREATININE 0.68 0.77  --  0.67 0.57 0.69  ?CALCIUM 8.1* 8.2*  --  8.0* 8.3* 8.7*  ?MG  --   --  1.8 2.5*  --  2.1  ? ?GFR: ?Estimated Creatinine Clearance: 62.4 mL/min (by C-G formula based on SCr of 0.69 mg/dL). ?Liver Function Tests: ?Recent Labs  ?Lab 10/01/21 ?1632 10/02/21 ?0215  ?AST 52* 34  ?ALT 20 14  ?ALKPHOS 109 100  ?BILITOT 0.7 0.5  ?PROT 5.6* 5.3*  ?ALBUMIN 2.5* 2.4*  ? ?No results for input(s): LIPASE, AMYLASE in the last 168 hours. ?No results for input(s): AMMONIA in the last 168 hours. ?Coagulation Profile: ?No results for input(s): INR, PROTIME in the last 168 hours. ?Cardiac Enzymes: ?No results for input(s): CKTOTAL, CKMB, CKMBINDEX, TROPONINI in the last 168 hours. ?BNP (last 3 results) ?No results for input(s): PROBNP in the last 8760 hours. ?HbA1C: ?No results for input(s): HGBA1C in the last 72 hours. ?CBG: ?No results for input(s): GLUCAP in the last 168 hours. ?Lipid Profile: ?No results for input(s): CHOL, HDL, LDLCALC, TRIG, CHOLHDL, LDLDIRECT in the last 72 hours. ?Thyroid Function Tests: ?No results for input(s): TSH, T4TOTAL, FREET4, T3FREE, THYROIDAB in the last 72 hours. ?Anemia Panel: ?Recent Labs  ?  10/02/21 ?0215  ?VITAMINB12 489  ?FOLATE 18.6  ?FERRITIN 133  ?TIBC 269   ?IRON 19*  ?RETICCTPCT 2.2  ? ?Sepsis Labs: ?No results for input(s): PROCALCITON, LATICACIDVEN in the last 168 hours. ? ?Recent Results (from the past 240 hour(s))  ?Surgical pcr screen     Status: None  ? Collection Time: 09/27/21  1:08 PM  ? Specimen: Nasal Mucosa; Nasal Swab  ?Result Value Ref Range Status  ? MRSA, PCR NEGATIVE NEGATIVE Final  ? Staphylococcus aureus NEGATIVE NEGATIVE Final  ?  Comment: (NOTE) ?The Xpert SA Assay (FDA approved for NASAL specimens in patients 43 ?years of age and older), is one component of a comprehensive ?surveillance program. It is not intended to diagnose infection nor to ?guide or monitor treatment. ?Performed at [facility], [address] [place], [place] ?[address] ?  ?  ? ? ? ? ? ?Radiology Studies: ?No results found. ? ? ? ? ? ?Scheduled Meds: ? amLODipine  5 mg Oral QHS  ? donepezil  5 mg Oral QHS  ? enalapril  20 mg Oral BID  ? enoxaparin (LOVENOX) injection  40 mg Subcutaneous Q24H  ?  estradiol  0.5 mg Oral Daily  ? pantoprazole  40 mg Oral Q1200  ? polyethylene glycol  17 g Oral BID  ? pravastatin  20 mg Oral QHS  ? progesterone  100 mg Oral QHS  ? senna-docusate  2 tablet Oral BID  ? sertraline  50 mg Oral Daily  ? sodium chloride flush  3 mL Intravenous Q12H  ? ?Continuous Infusions: ? sodium chloride    ? methocarbamol (ROBAXIN) IV    ? ? ? LOS: 6 days  ? ? ?Time spent: 41 minutes spent on chart review, discussion with nursing staff, consultants, updating family and interview/physical exam; more than 50% of that time was spent in counseling and/or coordination of care. ? ? ? ?Nygel Prokop J [place] DO ?[facility] ?Available via Epic secure chat 7am-7pm ?After these hours, please refer to coverage provider listed on [email or web address] ?10/04/2021, 10:31 AM  ? ?

## 2021-10-04 NOTE — Progress Notes (Signed)
Patient ID: [name], female   DOB: 01/12/1941, 81 y.o.   MRN: [id number] ?Called to talk w medical director ... not available... they left an "escalation email" for them to get back to me...whatever that means ?

## 2021-10-04 NOTE — Progress Notes (Signed)
Physical Therapy Treatment ?Patient Details ?Name: [name] ?MRN: [id number] ?DOB: [birth year] ?Today's Date: 10/04/2021 ? ? ?History of Present Illness Pt is an 81 y.o. 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 PMH includes 3x prior back sxs (most recently 08/2019), bilateral TKA (2011, 2018), HTN, arthritis, anxiety. ? ?  ?PT Comments  ? ? Pt progressing towards her physical therapy goals. Pt asking to not wear spinal brace; discussed surgeon's recommendations for bracing with mobility and rationale and donned at beginning of session. Pt requiring up to mod assist for bed mobility and minimal assist for transfers. Pt performing seated warm up exercise and ambulating 45 ft with a walker at a min guard assist level. Pt reporting L foot pain; x-ray negative for acute fx. May benefit from wearing shoes for improved support. Pt presents with generalized weakness, impaired sitting/standing balance and decreased activity tolerance. Presents as a high fall risk based on decreased gait speed and history of falls. Recommend post acute rehab to address deficits and maximize functional mobility.  ?  ?Recommendations for follow up therapy are one component of a multi-disciplinary discharge planning process, led by the attending physician.  Recommendations may be updated based on patient status, additional functional criteria and insurance authorization. ? ?Follow Up Recommendations ? Acute inpatient rehab (3hours/day) ?  ?  ?Assistance Recommended at Discharge Intermittent Supervision/Assistance  ?Patient can return home with the following A little help with walking and/or transfers;A little help with bathing/dressing/bathroom;Assistance with cooking/housework;Direct supervision/assist for medications management;Direct supervision/assist for financial management;Assist for transportation;Help with stairs or ramp for entrance ?   ?Equipment Recommendations ? None recommended by PT  ?  ?Recommendations for Other Services Rehab consult ? ? ?  ?Precautions / Restrictions Precautions ?Precautions: Back;Fall ?Precaution Booklet Issued: No ?Precaution Comments: back brace ?Required Braces or Orthoses: Spinal Brace ?Spinal Brace: Lumbar corset ?Restrictions ?Weight Bearing Restrictions: No  ?  ? ?Mobility ? Bed Mobility ?Overal bed mobility: Needs Assistance ?Bed Mobility: Sit to Supine, Sidelying to Sit ?  ?Sidelying to sit: Min assist ?  ?  ?Sit to sidelying: Mod assist ?General bed mobility comments: Assist for truncal elevation to complete last ~25% from supine to sitting position. Assist for BLE's back into bed ?  ? ?Transfers ?Overall transfer level: Needs assistance ?Equipment used: Rolling walker (2 wheels) ?Transfers: Sit to/from Stand ?Sit to Stand: Min assist ?  ?  ?  ?  ?  ?General transfer comment: MinA to power up to standing. Increased time/effort ?  ? ?Ambulation/Gait ?Ambulation/Gait assistance: Min guard ?Gait Distance (Feet): 45 Feet ?Assistive device: Rolling walker (2 wheels) ?Gait Pattern/deviations: Decreased stride length, Step-to pattern, Decreased step length - right, Trunk flexed ?Gait velocity: reduced ?Gait velocity interpretation: <1.31 ft/sec, indicative of household ambulator ?  ?General Gait Details: Cues for upright posture, but pt unable to correct. Slowed step to gait ? ? ?Stairs ?  ?  ?  ?  ?  ? ? ?Wheelchair Mobility ?  ? ?Modified Rankin (Stroke Patients Only) ?  ? ? ?  ?Balance Overall balance assessment: Needs assistance ?Sitting-balance support: Feet supported ?Sitting balance-Leahy Scale: Fair ?  ?  ?Standing balance support: Bilateral upper extremity supported, Reliant on assistive device for balance ?Standing balance-Leahy Scale: Poor ?  ?  ?  ?  ?  ?  ?  ?  ?  ?  ?  ?  ?  ? ?  ?Cognition Arousal/Alertness: Awake/alert ?Behavior  During Therapy: [facility] for tasks assessed/performed ?Overall Cognitive Status:  Within Functional Limits for tasks assessed ?  ?  ?  ?  ?  ?  ?  ?  ?  ?  ?  ?  ?  ?  ?  ?  ?  ?  ?  ? ?  ?Exercises General Exercises - Lower Extremity ?Long Arc Quad: Both, 10 reps, Seated ?Hip Flexion/Marching: Both, 10 reps, Seated ? ?  ?General Comments   ?  ?  ? ?Pertinent Vitals/Pain Pain Assessment ?Pain Assessment: Faces ?Faces Pain Scale: Hurts little more ?Pain Location: back ?Pain Descriptors / Indicators: Grimacing ?Pain Intervention(s): Limited activity within patient's tolerance, Monitored during session  ? ? ?Home Living   ?  ?  ?  ?  ?  ?  ?  ?  ?  ?   ?  ?Prior Function    ?  ?  ?   ? ?PT Goals (current goals can now be found in the care plan section) Acute Rehab PT Goals ?Patient Stated Goal: decreased pain, return home ?PT Goal Formulation: With patient ?Time For Goal Achievement: 10/12/21 ?Potential to Achieve Goals: Good ?Progress towards PT goals: Progressing toward goals ? ?  ?Frequency ? ? ? Min 5X/week ? ? ? ?  ?PT Plan Current plan remains appropriate  ? ? ?Co-evaluation   ?  ?  ?  ?  ? ?  ?AM-PAC PT "6 Clicks" Mobility   ?Outcome Measure ? Help needed turning from your back to your side while in a flat bed without using bedrails?: A Little ?Help needed moving from lying on your back to sitting on the side of a flat bed without using bedrails?: A Little ?Help needed moving to and from a bed to a chair (including a wheelchair)?: A Little ?Help needed standing up from a chair using your arms (e.g., wheelchair or bedside chair)?: A Little ?Help needed to walk in hospital room?: A Little ?Help needed climbing 3-5 steps with a railing? : Total ?6 Click Score: 16 ? ?  ?End of Session Equipment Utilized During Treatment: Back brace ?Activity Tolerance: Patient tolerated treatment well ?Patient left: with call bell/phone within reach;in bed;Other (comment) (per pt request) ?Nurse Communication: Mobility status ?PT Visit Diagnosis: Other abnormalities of gait and mobility (R26.89);Pain ?Pain - part  of body:  (back) ?  ? ? ?Time: 4784-1282 ?PT Time Calculation (min) (ACUTE ONLY): 24 min ? ?Charges:  $Gait Training: 8-22 mins ?$Therapeutic Activity: 8-22 mins          ?          ? ?Lillia Pauls, PT, DPT ?Acute Rehabilitation Services ?Pager [phone] ?Office [phone] ? ? ? ?Carloine Ernestina Penna ?10/04/2021, 4:53 PM ? ?

## 2021-10-04 NOTE — Progress Notes (Signed)
Inpatient Rehab Admissions Coordinator:  ? ?Notified by insurance of request for peer to peer.  I sent Dr. Danielle Dess a message to complete by 12 noon today if desired.  I will follow.  ? ?Estill Dooms, PT, DPT ?Admissions Coordinator ?[phone] ?10/04/21  ?10:33 AM ? ? ? ?

## 2021-10-04 NOTE — Progress Notes (Signed)
Patient ID: [name], female   DOB: [birth year], 81 y.o.   MRN: [id number] ?Vital signs are stable patient is awake alert complaining of back brace hurting her when she is seated I advised that while she is seated in her recliner she can have the brace loose or off but when she is walking I want her to be in especially now during the acute phase of her convalescence. ? ?I had a peer to peer review with Electrical engineer who noted that she would be approved for subacute rehabilitation care I left a message with case managers regarding that issue. ? ?At the current time it seems that her strength is improving albeit slowly.  She continues with supportive care and appreciate the help of the hospitalist and managing medical issues. ?

## 2021-10-05 NOTE — TOC Progression Note (Addendum)
Transition of Care (TOC) - Progression Note  ? ? ?Patient Details  ?Name: [name] ?MRN: [id number] ?Date of Birth: 1941/02/24 ? ?Transition of Care (TOC) CM/SW Contact  ?Eduard Roux, LCSW ?Phone Number: ?10/05/2021, 10:24 AM ? ?Clinical Narrative:    ? ?CSW received message from CM, patient now agreeable to SNF  and agreeable to send out referrals.  ? ?CSW will provide bed offers once available ?CSW will continue to follow and assist with discharge planning. ? ?Antony Blackbird, MSW, LCSW ?Clinical Social Worker ? ? ? ?Expected Discharge Plan: IP Rehab Facility ?Barriers to Discharge: Continued Medical Work up, English as a second language teacher ? ?Expected Discharge Plan and Services ?Expected Discharge Plan: IP Rehab Facility ?  ?Discharge Planning Services: CM Consult ?  ?Living arrangements for the past 2 months: Single Family Home ?                ?  ?  ?  ?  ?  ?HH Arranged: PT, OT ?  ?  ?  ?  ? ? ?Social Determinants of Health (SDOH) Interventions ?  ? ?Readmission Risk Interventions ?   ? View : No data to display.  ?  ?  ?  ? ? ?

## 2021-10-05 NOTE — Progress Notes (Signed)
?PROGRESS NOTE ? ? ? ?[name]  ZLD:[id number] DOB: 1941/05/01 DOA: 09/28/2021 ?PCP: Geoffry Paradise, MD  ? ? ?Brief Narrative:  ? ?[name] is a 81 year old female with past medical history significant for essential hypertension, hyperlipidemia, chronic back issues who presented to [facility] for severe back pain secondary to severe spinal stenosis and underwent posterior decompression and discectomy of T12-L1 along with posterior lumbar interbody fusion on 09/28/2021.  Patient has been complaining of nausea with fatigue over the last 48 hours, no vomiting reported.  Denies chest pain, shortness of breath, abdominal pain, no dysuria or increased urinary frequency.  Laboratory evaluation revealed hypokalemia.  No bowel movement reported since Monday.  Hospitalist service consulted by neurosurgery service for evaluation of nausea, hypokalemia. ? ?Assessment & Plan: ?  ?Nausea ?Constipation ?Patient complaining of nausea, denies vomiting.  Continues with poor oral intake.  No bowel movement reported since 4/24.  Abdominal x-ray with no evidence of bowel obstruction or free air; but notable for mild stool burden. ?--Senokot-S 2 tablets BID ?--MiraLAX BID ?--Bisacodyl suppository as needed ?--Needs increased mobility ? ?Hypokalemia: Resolved ?Potassium 4.3 this morning ? ?Iron deficiency anemia ?Hemoglobin 9.2 on 4/26, decreased to low 8.6.  Anemia panel with iron 19, TIBC 269, ferritin 133, folate 18.6, vitamin B12 49. ?--Hgb 9.6 this am ?--s/p IV iron x 2 ?--Ferrous sulfate 325 mg p.o. daily ? ?Severe spinal stenosis ?Underwent posterior decompression and discectomy of T12-L1 with posterior lumbar interbody fusion on 09/28/2021. ?--Continue PT OT efforts while inpatient, pending insurance authorization for CIR ?--Further per neurosurgery ? ?Essential hypertension ?--Amlodipine 5 mg p.o. daily ?--Enalapril 20 mg p.o. twice daily ? ?Depression/anxiety: Sertraline 50 mg p.o. daily ? ?Hyperlipidemia ?--Pravastatin 20 mg  p.o. daily ? ? ? ?DVT prophylaxis: enoxaparin (LOVENOX) injection 40 mg Start: 10/01/21 2200 ?SCD's Start: 09/28/21 1513 ? ?  Code Status: Full Code ?Family Communication: None present at bedside ? ?Disposition Plan:  ?Level of care: Med-Surg ?Status is: Inpatient ?Remains inpatient appropriate because: Pending insurance authorization for CIR, disposition per primary, neurosurgery ?  ? ?Subjective: ?Patient seen examined bedside, resting comfortably.  Sitting in bedside chair with breakfast tray present.  Asking for assistance opening food items.  Reports that she had a bowel movement yesterday, but not reported in EMR.  Insurance declined CIR admission, now pending SNF placement.  Denies headache, no dizziness, no chest pain, no shortness of breath, no vomiting/diarrhea, no weakness, no paresthesias.  No acute events overnight per nursing staff.   Medically stable for discharge to CIR once facility/placement identified. ? ?Objective: ?Vitals:  ? 10/04/21 2026 10/05/21 0035 10/05/21 0355 10/05/21 0800  ?BP: (!) 155/60 (!) 144/71 (!) 168/63 (!) 150/64  ?Pulse: 80 76 76 70  ?Resp:  16  14  ?Temp: 97.7 ?F (36.5 ?C) 98.1 ?F (36.7 ?C) 97.6 ?F (36.4 ?C) 98 ?F (36.7 ?C)  ?TempSrc:  Oral  Oral  ?SpO2: 96% 95% 95% 96%  ?Weight:      ?Height:      ? ? ?Intake/Output Summary (Last 24 hours) at 10/05/2021 1013 ?Last data filed at 10/04/2021 2142 ?Gross per 24 hour  ?Intake --  ?Output 450 ml  ?Net -450 ml  ? ?Filed Weights  ? 09/28/21 0616  ?Weight: 83.9 kg  ? ? ?Examination: ? ?Physical Exam: ?GEN: NAD, alert and oriented x 3, wd/wn ?HEENT: NCAT, PERRL, EOMI, sclera clear, MMM ?PULM: CTAB w/o wheezes/crackles, normal respiratory effort, on room air ?CV: RRR w/o M/G/R ?GI: abd soft, NTND, NABS, no  R/G/M ?MSK: no peripheral edema, muscle strength globally intact 5/5 bilateral upper/lower extremities ?NEURO: CN II-XII intact, no focal deficits, sensation to light touch intact ?PSYCH: normal mood/affect ?Integumentary: dry/intact, no  rashes or wounds ? ? ? ?Data Reviewed: I have personally reviewed following labs and imaging studies ? ?CBC: ?Recent Labs  ?Lab 09/29/21 ?0313 10/01/21 ?1042 10/02/21 ?0215 10/03/21 ?0145 10/04/21 ?0301  ?WBC 12.6* 11.6* 9.3 8.5 10.0  ?HGB 9.2* 8.7* 8.6* 9.1* 9.6*  ?HCT 26.6* 26.3* 26.1* 26.2* 29.8*  ?MCV 89.9 91.6 91.6 89.1 92.5  ?PLT 196 182 223 264 334  ? ?Basic Metabolic Panel: ?Recent Labs  ?Lab 09/29/21 ?0313 10/01/21 ?1042 10/01/21 ?1632 10/02/21 ?0215 10/03/21 ?0145 10/04/21 ?0301  ?NA 137 134*  --  134* 134* 134*  ?K 3.1* 2.8*  --  3.6 3.6 4.3  ?CL 105 97*  --  98 96* 98  ?CO2 26 28  --  29 28 26   ?GLUCOSE 125* 125*  --  102* 113* 106*  ?BUN 10 11  --  5* 5* 10  ?CREATININE 0.68 0.77  --  0.67 0.57 0.69  ?CALCIUM 8.1* 8.2*  --  8.0* 8.3* 8.7*  ?MG  --   --  1.8 2.5*  --  2.1  ? ?GFR: ?Estimated Creatinine Clearance: 62.4 mL/min (by C-G formula based on SCr of 0.69 mg/dL). ?Liver Function Tests: ?Recent Labs  ?Lab 10/01/21 ?1632 10/02/21 ?0215  ?AST 52* 34  ?ALT 20 14  ?ALKPHOS 109 100  ?BILITOT 0.7 0.5  ?PROT 5.6* 5.3*  ?ALBUMIN 2.5* 2.4*  ? ?No results for input(s): LIPASE, AMYLASE in the last 168 hours. ?No results for input(s): AMMONIA in the last 168 hours. ?Coagulation Profile: ?No results for input(s): INR, PROTIME in the last 168 hours. ?Cardiac Enzymes: ?No results for input(s): CKTOTAL, CKMB, CKMBINDEX, TROPONINI in the last 168 hours. ?BNP (last 3 results) ?No results for input(s): PROBNP in the last 8760 hours. ?HbA1C: ?No results for input(s): HGBA1C in the last 72 hours. ?CBG: ?No results for input(s): GLUCAP in the last 168 hours. ?Lipid Profile: ?No results for input(s): CHOL, HDL, LDLCALC, TRIG, CHOLHDL, LDLDIRECT in the last 72 hours. ?Thyroid Function Tests: ?No results for input(s): TSH, T4TOTAL, FREET4, T3FREE, THYROIDAB in the last 72 hours. ?Anemia Panel: ?No results for input(s): VITAMINB12, FOLATE, FERRITIN, TIBC, IRON, RETICCTPCT in the last 72 hours. ? ?Sepsis Labs: ?No results for  input(s): PROCALCITON, LATICACIDVEN in the last 168 hours. ? ?Recent Results (from the past 240 hour(s))  ?Surgical pcr screen     Status: None  ? Collection Time: 09/27/21  1:08 PM  ? Specimen: Nasal Mucosa; Nasal Swab  ?Result Value Ref Range Status  ? MRSA, PCR NEGATIVE NEGATIVE Final  ? Staphylococcus aureus NEGATIVE NEGATIVE Final  ?  Comment: (NOTE) ?The Xpert SA Assay (FDA approved for NASAL specimens in patients 4322 ?years of age and older), is one component of a comprehensive ?surveillance program. It is not intended to diagnose infection nor to ?guide or monitor treatment. ?Performed at [facility], [address] [place], [place] ?[id number] ?  ?  ? ? ? ? ? ?Radiology Studies: ?DG Foot Complete Left ? ?Result Date: 10/04/2021 ?CLINICAL DATA:  Pain left foot EXAM: LEFT FOOT - COMPLETE 3+ VIEW COMPARISON:  None. FINDINGS: No recent fracture or dislocation is seen. Hallux valgus deformity is noted. Surgical screws seen in the first and second metatarsals and proximal phalanx of big toe. There is linear calcification in the Achilles tendon  close to calcaneus suggesting calcific tendinosis. IMPRESSION: No recent fracture or dislocation is seen. Hallux valgus deformity is noted. Other findings as described in the body of the report. Electronically Signed   By: Ernie Avena M.D.   On: 10/04/2021 14:10   ? ? ? ? ? ?Scheduled Meds: ? amLODipine  5 mg Oral QHS  ? donepezil  5 mg Oral QHS  ? enalapril  20 mg Oral BID  ? enoxaparin (LOVENOX) injection  40 mg Subcutaneous Q24H  ? estradiol  0.5 mg Oral Daily  ? ferrous sulfate  325 mg Oral Q breakfast  ? pantoprazole  40 mg Oral Q1200  ? polyethylene glycol  17 g Oral BID  ? pravastatin  20 mg Oral QHS  ? progesterone  100 mg Oral QHS  ? senna-docusate  2 tablet Oral BID  ? sertraline  50 mg Oral Daily  ? sodium chloride flush  3 mL Intravenous Q12H  ? ?Continuous Infusions: ? sodium chloride    ? methocarbamol (ROBAXIN) IV    ? ? ? LOS: 7 days  ? ? ?Time  spent: 41 minutes spent on chart review, discussion with nursing staff, consultants, updating family and interview/physical exam; more than 50% of that time was spent in counseling and/or coordination

## 2021-10-05 NOTE — TOC Progression Note (Signed)
Transition of Care (TOC) - Progression Note  ? ? ?Patient Details  ?Name: [name] ?MRN: [id number] ?Date of Birth: 1940/10/09 ? ?Transition of Care (TOC) CM/SW Contact  ?Eduard Roux, LCSW ?Phone Number: ?10/05/2021, 2:24 PM ? ?Clinical Narrative:    ? ?Received PSARR approval  [id number] A ? ?CSW visit with patient , provided bed offers w/ medicare.gov ratings. CSW advsied the patient to select 2 preferred SNFs for consideration. Informed [facility] has no availability.  ? ?Antony Blackbird, MSW, LCSW ?Clinical Social Worker ? ? ? ? ?Expected Discharge Plan: IP Rehab Facility ?Barriers to Discharge: English as a second language teacher, SNF Pending bed offer ? ?Expected Discharge Plan and Services ?Expected Discharge Plan: IP Rehab Facility ?  ?Discharge Planning Services: CM Consult ?  ?Living arrangements for the past 2 months: Single Family Home ?                ?  ?  ?  ?  ?  ?HH Arranged: PT, OT ?  ?  ?  ?  ? ? ?Social Determinants of Health (SDOH) Interventions ?  ? ?Readmission Risk Interventions ?   ? View : No data to display.  ?  ?  ?  ? ? ?

## 2021-10-05 NOTE — TOC Progression Note (Signed)
Transition of Care (TOC) - Progression Note  ? ? ?Patient Details  ?Name: [name] ?MRN: [id number] ?Date of Birth: Dec 21, 1940 ? ?Transition of Care (TOC) CM/SW Contact  ?Vinie Sill, LCSW ?Phone Number: ?10/05/2021, 12:59 PM ? ?Clinical Narrative:    ? ?Uploaded clinicals to [facility] for review  ? ?Expected Discharge Plan: [place] ?Barriers to Discharge: [place] (PASRR), Insurance Authorization, SNF Pending bed offer ? ?Expected Discharge Plan and Services ?Expected Discharge Plan: [place] ?  ?Discharge Planning Services: CM Consult ?  ?Living arrangements for the past 2 months: [place] ?                ?  ?  ?  ?  ?  ?HH Arranged: PT, OT ?  ?  ?  ?  ? ? ?Social Determinants of Health (SDOH) Interventions ?  ? ?Readmission Risk Interventions ?   ? View : No data to display.  ?  ?  ?  ? ? ?

## 2021-10-05 NOTE — Progress Notes (Signed)
Inpatient Rehab Admissions Coordinator:  ? ?Received denial from insurance for CIR prior auth request.  Pt will require SNF versus HH.  TOC aware and working on dispo.  Will sign off.   ? ?Estill Dooms, PT, DPT ?Admissions Coordinator ?[phone] ?10/05/21  ?10:36 AM ? ?

## 2021-10-05 NOTE — Progress Notes (Signed)
Mobility Specialist: Progress Note ? ? 10/05/21 1548  ?Mobility  ?Activity Ambulated with assistance in hallway  ?Level of Assistance Contact guard assist, steadying assist  ?Assistive Device Front wheel walker  ?Distance Ambulated (ft) 128 ft ?(94'+34')  ?Activity Response Tolerated well  ?$Mobility charge 1 Mobility  ? ?Pt received in the chair and agreeable to ambulation. C/o lower back pain during session she rated 5/10, otherwise no c/o throughout. Pt to the recliner after session with call bell and phone at her side.  ? ?Cristal Deer Mattie Nordell ?Mobility Specialist ?Mobility Specialist 5 [address]: [phone] ?Mobility Specialist 6 North: [phone] ? ?

## 2021-10-05 NOTE — [place] FL2 (Signed)
?[place] MEDICAID FL2 LEVEL OF CARE SCREENING TOOL  ?  ? ?IDENTIFICATION  ?Patient Name: ?[name] Birthdate: [birth year] Sex: female Admission Date (Current Location): ?09/28/2021  ?[place] Number: ? [place] ?  Facility and Address:  ?[facility], [address] [place], [place] [address] ?     Provider Number: ?   ?Attending Physician Name and Address:  ?Barnett Abu, MD ? Relative Name and Phone Number:  ?  ?   ?Current Level of Care: ?Hospital Recommended Level of Care: ?Skilled Nursing Facility Prior Approval Number: ?  ? ?Date Approved/Denied: ?  PASRR Number: ?Under Review ? ?Discharge Plan: ?SNF ?  ? ?Current Diagnoses: ?Patient Active Problem List  ? Diagnosis Date Noted  ? Other spondylosis with radiculopathy, lumbar region 02/09/2021  ? Lumbar stenosis with neurogenic claudication 09/03/2019  ? Trochanteric bursitis, right hip 01/14/2019  ? Lumbar radiculopathy, chronic 06/29/2018  ? OA (osteoarthritis) of knee 09/19/2016  ? Palpitations 12/09/2015  ? Right carotid bruit 12/09/2015  ? Syncope 12/08/2015  ? Degenerative disc disease, lumbar 12/08/2015  ? Essential hypertension 12/08/2015  ? High cholesterol   ? Arthritis   ? ? ?Orientation RESPIRATION BLADDER Height & Weight   ?  ?Self, Time, Situation, Place ? Normal External catheter, Incontinent Weight: 185 lb (83.9 kg) ?Height:  5\' 7"  (170.2 cm)  ?BEHAVIORAL SYMPTOMS/MOOD NEUROLOGICAL BOWEL NUTRITION STATUS  ?    Continent Diet (please see discharge summary)  ?AMBULATORY STATUS COMMUNICATION OF NEEDS Skin   ?Limited Assist Verbally Surgical wounds (closed incision, Back) ?  ?  ?  ?    ?     ?     ? ? ?Personal Care Assistance Level of Assistance  ?Bathing, Feeding, Dressing Bathing Assistance: Limited assistance ?Feeding assistance: Independent ?Dressing Assistance: Limited assistance ?   ? ?Functional Limitations Info  ?Sight, Hearing, Speech Sight Info: Adequate ?Hearing Info: Adequate ?Speech Info:  Adequate  ? ? ?SPECIAL CARE FACTORS FREQUENCY  ?PT (By licensed PT), OT (By licensed OT)   ?  ?PT Frequency: 5x per week ?OT Frequency: 5x per week ?  ?  ?  ?   ? ? ?Contractures Contractures Info: Not present  ? ? ?Additional Factors Info  ?Code Status, Allergies Code Status Info: Full ?Allergies Info: NKA ?  ?  ?  ?   ? ?Current Medications (10/05/2021):  This is the current hospital active medication list ?Current Facility-Administered Medications  ?Medication Dose Route Frequency Provider Last Rate Last Admin  ? 0.9 %  sodium chloride infusion  250 mL Intravenous Continuous 12/05/2021, MD      ? acetaminophen (TYLENOL) tablet 650 mg  650 mg Oral Q4H PRN Barnett Abu, MD   650 mg at 10/04/21 2308  ? Or  ? acetaminophen (TYLENOL) suppository 650 mg  650 mg Rectal Q4H PRN 2309, MD      ? alum & mag hydroxide-simeth (MAALOX/MYLANTA) 200-200-20 MG/5ML suspension 30 mL  30 mL Oral Q6H PRN 12-13-2000, MD      ? amLODipine (NORVASC) tablet 5 mg  5 mg Oral Barnett Abu, MD   5 mg at 10/04/21 2137  ? bisacodyl (DULCOLAX) suppository 10 mg  10 mg Rectal Daily PRN 2138, MD      ? donepezil (ARICEPT) tablet 5 mg  5 mg Oral Barnett Abu, MD   5 mg at 10/04/21 2136  ? enalapril (VASOTEC) tablet 20 mg  20 mg Oral BID Elsner,  Sherilyn Cooter, MD   20 mg at 10/05/21 0941  ? enoxaparin (LOVENOX) injection 40 mg  40 mg Subcutaneous Q24H Osvaldo Shipper, MD   40 mg at 10/04/21 2137  ? estradiol (ESTRACE) tablet 0.5 mg  0.5 mg Oral Daily Barnett Abu, MD   0.5 mg at 10/05/21 0940  ? ferrous sulfate tablet 325 mg  325 mg Oral Q breakfast [place], Eric J, DO   325 mg at 10/05/21 2878  ? gabapentin (NEURONTIN) capsule 300 mg  300 mg Oral Daily PRN Barnett Abu, MD      ? HYDROcodone-acetaminophen (NORCO/VICODIN) 5-325 MG per tablet 1-2 tablet  1-2 tablet Oral Q4H PRN Barnett Abu, MD   2 tablet at 10/05/21 0950  ? HYDROmorphone (DILAUDID) injection 0.5-1 mg  0.5-1 mg Intravenous Q3H PRN Barnett Abu, MD   1 mg  at 10/01/21 6767  ? menthol-cetylpyridinium (CEPACOL) lozenge 3 mg  1 lozenge Oral PRN Barnett Abu, MD      ? Or  ? phenol (CHLORASEPTIC) mouth spray 1 spray  1 spray Mouth/Throat PRN Barnett Abu, MD      ? methocarbamol (ROBAXIN) tablet 500 mg  500 mg Oral Q6H PRN Barnett Abu, MD   500 mg at 10/04/21 2308  ? Or  ? methocarbamol (ROBAXIN) 500 mg in dextrose 5 % 50 mL IVPB  500 mg Intravenous Q6H PRN Barnett Abu, MD      ? methocarbamol (ROBAXIN) tablet 500 mg  500 mg Oral Q6H PRN Barnett Abu, MD      ? ondansetron (ZOFRAN) tablet 4 mg  4 mg Oral Q6H PRN Barnett Abu, MD      ? Or  ? ondansetron (ZOFRAN) injection 4 mg  4 mg Intravenous Q6H PRN Barnett Abu, MD   4 mg at 10/01/21 0554  ? pantoprazole (PROTONIX) EC tablet 40 mg  40 mg Oral Q1200 Osvaldo Shipper, MD   40 mg at 10/04/21 1147  ? polyethylene glycol (MIRALAX / GLYCOLAX) packet 17 g  17 g Oral BID Osvaldo Shipper, MD   17 g at 10/05/21 2094  ? pravastatin (PRAVACHOL) tablet 20 mg  20 mg Oral Claris Gladden, MD   20 mg at 10/04/21 2137  ? progesterone (PROMETRIUM) capsule 100 mg  100 mg Oral Claris Gladden, MD   100 mg at 10/04/21 2136  ? senna-docusate (Senokot-S) tablet 2 tablet  2 tablet Oral BID Osvaldo Shipper, MD   2 tablet at 10/05/21 0941  ? sertraline (ZOLOFT) tablet 50 mg  50 mg Oral Daily Barnett Abu, MD   50 mg at 10/05/21 0941  ? sodium chloride flush (NS) 0.9 % injection 3 mL  3 mL Intravenous Lance Muss, MD   3 mL at 10/05/21 0941  ? sodium chloride flush (NS) 0.9 % injection 3 mL  3 mL Intravenous PRN Barnett Abu, MD      ? sodium phosphate (FLEET) 7-19 GM/118ML enema 1 enema  1 enema Rectal Once PRN Barnett Abu, MD      ? ? ? ?Discharge Medications: ?Please see discharge summary for a list of discharge medications. ? ?Relevant Imaging Results: ? ?Relevant Lab Results: ? ? ?Additional Information ?SSN [id number]   Pfizer COVID-19 Vaccine 04/20/2020 ? ?Eduard Roux, LCSW ? ? ? ? ?

## 2021-10-05 NOTE — TOC Progression Note (Signed)
Transition of Care (TOC) - Progression Note  ? ? ?Patient Details  ?Name: [name] ?MRN: [id number] ?Date of Birth: 12-15-1940 ? ?Transition of Care (TOC) CM/SW Contact  ?Beckie Busing, RN ?Phone Number:[phone] ? ?10/05/2021, 9:37 AM ? ?Clinical Narrative:    ?CM at bedside to offer patient choice for home health services since patient has been denied CIR and has previously refused SNF placement for short term rehab. CM presented medicare.gov listing for Home health agencies and explained process of home health therapies. Patient states that she is from home alone and acknowledges that it would be a slow process if she had to go home. Patient would like list of rehab facilities because she is now rethinking her disposition plan. CM provided patient list of SNF facilities. Patient says she guesses that she will have to do rehab. SW made aware. TOC will continue to follow.  ? ? ?Expected Discharge Plan: IP Rehab Facility ?Barriers to Discharge: Continued Medical Work up, English as a second language teacher ? ?Expected Discharge Plan and Services ?Expected Discharge Plan: IP Rehab Facility ?  ?Discharge Planning Services: CM Consult ?  ?Living arrangements for the past 2 months: Single Family Home ?                ?  ?  ?  ?  ?  ?HH Arranged: PT, OT ?  ?  ?  ?  ? ? ?Social Determinants of Health (SDOH) Interventions ?  ? ?Readmission Risk Interventions ?   ? View : No data to display.  ?  ?  ?  ? ? ?

## 2021-10-05 NOTE — Progress Notes (Signed)
Patient ID: [name], female   DOB: 1941-04-07, 81 y.o.   MRN: [id number] ?Vitals stable ?Patient is slowly improving her functional status ?She has not been approved for acute inpatient rehabilitation ?Is looking at SNF placement ?We will continue to work towards that goal ?

## 2021-10-05 NOTE — TOC Progression Note (Signed)
Transition of Care (TOC) - Progression Note  ? ? ?Patient Details  ?Name: [name] ?MRN: [id number] ?Date of Birth: 22-Aug-1940 ? ?Transition of Care (TOC) CM/SW Contact  ?Eduard Roux, LCSW ?Phone Number: ?10/05/2021, 4:28 PM ? ?Clinical Narrative:    ? ?Patient has decided on [place] and [place] ? ?Message was sent to [facility] for confirmation- waiting on response ? ? ? ?Expected Discharge Plan: IP Rehab Facility ?Barriers to Discharge: English as a second language teacher, SNF Pending bed offer ? ?Expected Discharge Plan and Services ?Expected Discharge Plan: IP Rehab Facility ?  ?Discharge Planning Services: CM Consult ?  ?Living arrangements for the past 2 months: Single Family Home ?                ?  ?  ?  ?  ?  ?HH Arranged: PT, OT ?  ?  ?  ?  ? ? ?Social Determinants of Health (SDOH) Interventions ?  ? ?Readmission Risk Interventions ?   ? View : No data to display.  ?  ?  ?  ? ? ?

## 2021-10-05 NOTE — Progress Notes (Signed)
Physical Therapy Treatment ?Patient Details ?Name: [name] ?MRN: [id number] ?DOB: 1940-09-19 ?Today's Date: 10/05/2021 ? ? ?History of Present Illness Pt is an 81 y.o. 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 PMH includes 3x prior back sxs (most recently 08/2019), bilateral TKA (2011, 2018), HTN, arthritis, anxiety. ? ?  ?PT Comments  ? ? Pt progressing slowly towards her physical therapy goals; is agreeable to participate. Pt participated in seated exercises and then ambulating 30 ft x 2 with a walker and seated rest break in between bouts. Donned shoes this session which pt states helped with left foot pain. Pt continues with decreased functional mobility secondary to generalized weakness, impaired balance, decreased activity tolerance and pain. Recommend post acute rehab to address. ?   ?Recommendations for follow up therapy are one component of a multi-disciplinary discharge planning process, led by the attending physician.  Recommendations may be updated based on patient status, additional functional criteria and insurance authorization. ? ?Follow Up Recommendations ? Acute inpatient rehab (3hours/day) ?  ?  ?Assistance Recommended at Discharge Intermittent Supervision/Assistance  ?Patient can return home with the following A little help with walking and/or transfers;A little help with bathing/dressing/bathroom;Assistance with cooking/housework;Direct supervision/assist for medications management;Direct supervision/assist for financial management;Assist for transportation;Help with stairs or ramp for entrance ?  ?Equipment Recommendations ? None recommended by PT  ?  ?Recommendations for Other Services   ? ? ?  ?Precautions / Restrictions Precautions ?Precautions: Back;Fall ?Restrictions ?Weight Bearing Restrictions: No  ?  ? ?Mobility ? Bed Mobility ?  ?  ?  ?  ?  ?  ?  ?General bed mobility comments: OOB in  chair ?  ? ?Transfers ?Overall transfer level: Needs assistance ?Equipment used: Rolling walker (2 wheels) ?Transfers: Sit to/from Stand ?Sit to Stand: Min assist ?  ?  ?  ?  ?  ?General transfer comment: MinA to rise ?  ? ?Ambulation/Gait ?Ambulation/Gait assistance: Min guard ?Gait Distance (Feet): 60 Feet (30 ft, 30 ft) ?Assistive device: Rolling walker (2 wheels) ?Gait Pattern/deviations: Decreased stride length, Step-to pattern, Decreased step length - right, Trunk flexed ?Gait velocity: decreased ?  ?  ?General Gait Details: Slowed step to pattern, fatigues easily, chair follow utilized. min guard for safety ? ? ?Stairs ?  ?  ?  ?  ?  ? ? ?Wheelchair Mobility ?  ? ?Modified Rankin (Stroke Patients Only) ?  ? ? ?  ?Balance Overall balance assessment: Needs assistance ?Sitting-balance support: Feet supported ?Sitting balance-Leahy Scale: Fair ?  ?  ?Standing balance support: Bilateral upper extremity supported, Reliant on assistive device for balance ?Standing balance-Leahy Scale: Poor ?  ?  ?  ?  ?  ?  ?  ?  ?  ?  ?  ?  ?  ? ?  ?Cognition Arousal/Alertness: Awake/alert ?Behavior During Therapy: [facility] for tasks assessed/performed ?Overall Cognitive Status: Within Functional Limits for tasks assessed ?  ?  ?  ?  ?  ?  ?  ?  ?  ?  ?  ?  ?  ?  ?  ?  ?  ?  ?  ? ?  ?Exercises General Exercises - Lower Extremity ?Long Arc Quad: Both, 5 reps, Seated ?Hip Flexion/Marching: Both, 5 reps, Seated ?Toe Raises: Both, 10 reps, Seated ?Heel Raises: Both, 10 reps, Seated ? ?  ?General Comments   ?  ?  ? ?Pertinent Vitals/Pain Pain Assessment ?Pain Assessment: Faces ?  Faces Pain Scale: Hurts little more ?Pain Location: back ?Pain Descriptors / Indicators: Grimacing ?Pain Intervention(s): Monitored during session  ? ? ?Home Living   ?  ?  ?  ?  ?  ?  ?  ?  ?  ?   ?  ?Prior Function    ?  ?  ?   ? ?PT Goals (current goals can now be found in the care plan section) Acute Rehab PT Goals ?Patient Stated Goal: decreased pain, return  home ?Potential to Achieve Goals: Good ?Progress towards PT goals: Progressing toward goals ? ?  ?Frequency ? ? ? Min 5X/week ? ? ? ?  ?PT Plan Current plan remains appropriate  ? ? ?Co-evaluation   ?  ?  ?  ?  ? ?  ?AM-PAC PT "6 Clicks" Mobility   ?Outcome Measure ? Help needed turning from your back to your side while in a flat bed without using bedrails?: A Little ?Help needed moving from lying on your back to sitting on the side of a flat bed without using bedrails?: A Little ?Help needed moving to and from a bed to a chair (including a wheelchair)?: A Little ?Help needed standing up from a chair using your arms (e.g., wheelchair or bedside chair)?: A Little ?Help needed to walk in hospital room?: A Little ?Help needed climbing 3-5 steps with a railing? : A Little ?6 Click Score: 18 ? ?  ?End of Session Equipment Utilized During Treatment: Gait belt;Back brace ?Activity Tolerance: Patient tolerated treatment well ?Patient left: in chair;with call bell/phone within reach ?Nurse Communication: Mobility status ?PT Visit Diagnosis: Other abnormalities of gait and mobility (R26.89);Pain ?  ? ? ?Time: 1610-9604 ?PT Time Calculation (min) (ACUTE ONLY): 27 min ? ?Charges:  $Therapeutic Activity: 23-37 mins          ?          ? ?Lillia Pauls, PT, DPT ?Acute Rehabilitation Services ?Pager [phone] ?Office [phone] ? ? ? ?[name] ?10/05/2021, 5:02 PM ? ?

## 2021-10-05 NOTE — Progress Notes (Signed)
? ?      ? ?                                                                                         ?                       ?  Re: [name] ?Date of Birth: 1940/10/02 ?Date: 10/05/2021 ? ?To Whom It May Concern: ? ?Please be advised that the above-named patient will require a short-term nursing home stay-anticipated 30 days or less for rehabilitation and strengthening. The plan is to return home.   ? ?

## 2021-10-05 NOTE — Progress Notes (Signed)
OT Cancellation Note ? ?Patient Details ?Name: WINDSOR ZIRKELBACH ?MRN: [id number] ?DOB: 21-Apr-1941 ? ? ?Cancelled Treatment:    Reason Eval/Treat Not Completed: Patient declined, no reason specified (Pt politely declining, stating she has ambulated with both PT and mobility today and would like to rest until she is ready to get back in the bed. OT treatment to f/u as appropriate.) ? ?Garnet Chatmon A Clarita Mcelvain ?10/05/2021, 3:57 PM ?

## 2021-10-06 MED ORDER — METHOCARBAMOL 500 MG PO TABS: 500.0000 mg | ORAL_TABLET | Freq: Four times a day (QID) | ORAL | 0 refills | Status: AC | PRN

## 2021-10-06 MED ORDER — HYDROCODONE-ACETAMINOPHEN 5-325 MG PO TABS: 1.0000 | ORAL_TABLET | ORAL | 0 refills | Status: DC | PRN

## 2021-10-06 NOTE — Progress Notes (Signed)
Mobility Specialist: Progress Note ? ? 10/06/21 1551  ?Mobility  ?Activity Ambulated with assistance in hallway  ?Level of Assistance Minimal assist, patient does 75% or more  ?Assistive Device Front wheel walker  ?Distance Ambulated (ft) 110 ft  ?Activity Response Tolerated well  ?$Mobility charge 1 Mobility  ? ?Pt received in the bed and agreeable to ambulation. C/o 7/10 back pain as well as BUE soreness during session. Cues for upright posture to help relieve pressure on arms. Pt to the recliner after session with call bell and phone at her side. Chair alarm is on.  ? ?[name] ?Mobility Specialist ?Mobility Specialist 5 North: [phone] ?Mobility Specialist 6 [address]: [phone] ? ?

## 2021-10-06 NOTE — Progress Notes (Signed)
Orthopedic Tech Progress Note ?Patient Details:  ?[name] ?March 03, 1941 ?[id number] ? ?  Ortho Devices ?Type of Ortho Device: Soft collar ?Ortho Device/Splint Location: NECK ?Ortho Device/Splint Interventions: Ordered ?  ?Post Interventions ?Patient Tolerated: Well ?Instructions Provided: Care of device ? ?Donald Pore ?10/06/2021, 2:41 PM ? ?

## 2021-10-06 NOTE — Progress Notes (Signed)
Physical Therapy Treatment ?Patient Details ?Name: [name] ?MRN: [id number] ?DOB: Feb 21, 1941 ?Today's Date: 10/06/2021 ? ? ?History of Present Illness Pt is an 81 y.o. 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 PMH includes 3x prior back sxs (most recently 08/2019), bilateral TKA (2011, 2018), HTN, arthritis, anxiety. ? ?  ?PT Comments  ? ? Pt refuses ambulation this session, electing only to transfer from bed to recliner despite PT encouragement. Pt reports ambulating twice yesterday, PT acknowledges this success but reiterates that the patient should be ambulating multiple times every day. Pt participates in LE exercise to aide in improving muscular endurance. PT updates recommendations to SNF placement as insurance denied AIR.  ?Recommendations for follow up therapy are one component of a multi-disciplinary discharge planning process, led by the attending physician.  Recommendations may be updated based on patient status, additional functional criteria and insurance authorization. ? ?Follow Up Recommendations ? Skilled nursing-short term rehab (<3 hours/day) ?  ?  ?Assistance Recommended at Discharge Intermittent Supervision/Assistance  ?Patient can return home with the following A little help with walking and/or transfers;A little help with bathing/dressing/bathroom;Assistance with cooking/housework;Direct supervision/assist for medications management;Direct supervision/assist for financial management;Assist for transportation;Help with stairs or ramp for entrance ?  ?Equipment Recommendations ? None recommended by PT  ?  ?Recommendations for Other Services   ? ? ?  ?Precautions / Restrictions Precautions ?Precautions: Back;Fall ?Precaution Booklet Issued: No ?Precaution Comments: back brace ?Required Braces or Orthoses: Spinal Brace ?Spinal Brace: Lumbar corset (pt reports Dr. Ellene Route ok'd for no brace when sitting  in recliner) ?Restrictions ?Weight Bearing Restrictions: No  ?  ? ?Mobility ? Bed Mobility ?Overal bed mobility: Needs Assistance ?Bed Mobility: Rolling, Sidelying to Sit ?Rolling: Min guard ?Sidelying to sit: Min guard, HOB elevated ?  ?  ?  ?General bed mobility comments: increased time, use of bed rails ?  ? ?Transfers ?Overall transfer level: Needs assistance ?Equipment used: Rolling walker (2 wheels) ?Transfers: Sit to/from Stand ?Sit to Stand: Min guard, From elevated surface ?  ?  ?  ?  ?  ?General transfer comment: increased time ?  ? ?Ambulation/Gait ?Ambulation/Gait assistance: Min guard ?Gait Distance (Feet): 3 Feet ?Assistive device: Rolling walker (2 wheels) ?Gait Pattern/deviations: Step-to pattern ?Gait velocity: reduced ?Gait velocity interpretation: <1.31 ft/sec, indicative of household ambulator ?  ?General Gait Details: pt with slowed step-to gait, reduced foot clearance and step length ? ? ?Stairs ?  ?  ?  ?  ?  ? ? ?Wheelchair Mobility ?  ? ?Modified Rankin (Stroke Patients Only) ?  ? ? ?  ?Balance Overall balance assessment: Needs assistance ?Sitting-balance support: No upper extremity supported, Feet supported ?Sitting balance-Leahy Scale: Fair ?  ?  ?Standing balance support: Bilateral upper extremity supported, Reliant on assistive device for balance ?Standing balance-Leahy Scale: Poor ?  ?  ?  ?  ?  ?  ?  ?  ?  ?  ?  ?  ?  ? ?  ?Cognition Arousal/Alertness: Awake/alert ?Behavior During Therapy: [facility] for tasks assessed/performed ?Overall Cognitive Status: Within Functional Limits for tasks assessed ?  ?  ?  ?  ?  ?  ?  ?  ?  ?  ?  ?  ?  ?  ?  ?  ?  ?  ?  ? ?  ?Exercises General Exercises - Lower Extremity ?Ankle Circles/Pumps: AROM, Both, 10 reps ?Gluteal Sets: AROM, Both, 10 reps ?  Long Arc Quad: AROM, Both, 10 reps ?Hip Flexion/Marching: AROM, Both, 10 reps ? ?  ?General Comments General comments (skin integrity, edema, etc.): VSS on RA ?  ?  ? ?Pertinent Vitals/Pain Pain Assessment ?Pain  Assessment: Faces ?Faces Pain Scale: Hurts even more ?Pain Location: L mid-back ?Pain Descriptors / Indicators: Grimacing ?Pain Intervention(s): Monitored during session  ? ? ?Home Living   ?  ?  ?  ?  ?  ?  ?  ?  ?  ?   ?  ?Prior Function    ?  ?  ?   ? ?PT Goals (current goals can now be found in the care plan section) Acute Rehab PT Goals ?Patient Stated Goal: decreased pain, return home ?Progress towards PT goals: Not progressing toward goals - comment (self-limiting, refusing ambulation despite PT encouragement) ? ?  ?Frequency ? ? ? Min 5X/week ? ? ? ?  ?PT Plan Current plan remains appropriate  ? ? ?Co-evaluation   ?  ?  ?  ?  ? ?  ?AM-PAC PT "6 Clicks" Mobility   ?Outcome Measure ? Help needed turning from your back to your side while in a flat bed without using bedrails?: A Little ?Help needed moving from lying on your back to sitting on the side of a flat bed without using bedrails?: A Little ?Help needed moving to and from a bed to a chair (including a wheelchair)?: A Little ?Help needed standing up from a chair using your arms (e.g., wheelchair or bedside chair)?: A Little ?Help needed to walk in hospital room?: A Lot ?Help needed climbing 3-5 steps with a railing? : Total ?6 Click Score: 15 ? ?  ?End of Session   ?Activity Tolerance: Other (comment) (self-limiting, refusing ambulation this morning) ?Patient left: in chair;with call bell/phone within reach;with chair alarm set ?Nurse Communication: Mobility status ?PT Visit Diagnosis: Other abnormalities of gait and mobility (R26.89);Pain ?Pain - part of body:  (back) ?  ? ? ?Time: [id number] ?PT Time Calculation (min) (ACUTE ONLY): 23 min ? ?Charges:  $Therapeutic Exercise: 8-22 mins ?$Therapeutic Activity: 8-22 mins          ?          ? ?Zenaida Niece, PT, DPT ?Acute Rehabilitation ?Pager: [phone] ?Office [phone] ? ? ? ?Zenaida Niece ?10/06/2021, 9:07 AM ? ?

## 2021-10-06 NOTE — Progress Notes (Signed)
Patient ID: [name], female   DOB: [birth year], 81 y.o.   MRN: [id number] ?Vital signs are stable ?Patient is complaining of neck pain today ?She says has been going on for days but this is the first note that I have had that she has had any.  I removed her dressing today the incision on the back is clean and dry ?Waiting decision regarding SNF.  I will add some baclofen 10 mg for the neck pain in addition she can use a soft collar as needed. ?

## 2021-10-06 NOTE — Progress Notes (Signed)
Occupational Therapy Treatment ?Patient Details ?Name: [name] ?MRN: [id number] ?DOB: 1940/12/31 ?Today's Date: 10/06/2021 ? ? ?History of present illness Pt is an 81 y.o. 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 PMH includes 3x prior back sxs (most recently 08/2019), bilateral TKA (2011, 2018), HTN, arthritis, anxiety. ?  ?OT comments ? Patient with fair progress toward patient focused goals.  Increased discomfort to her neck, a soft collar has been ordered for her neck.  She is able to figure four better for lower body ADL, but hip kit training continues.  Patient continues to be hesitant about moving, but is moving with occasional Min A for in room mobility/toileting, and up to Mod A for ADL completion at a sit/stand level.  OT will continue efforts in the acute setting, and SNF is recommended for post acute rehab prior to returning home.  The patient will not have the adequate assist at home, to directly transfer there without 24 hour assist.    ? ?Recommendations for follow up therapy are one component of a multi-disciplinary discharge planning process, led by the attending physician.  Recommendations may be updated based on patient status, additional functional criteria and insurance authorization. ?   ?Follow Up Recommendations ? Skilled nursing-short term rehab (<3 hours/day)  ?  ?Assistance Recommended at Discharge Frequent or constant Supervision/Assistance  ?Patient can return home with the following ? Assistance with cooking/housework;Help with stairs or ramp for entrance;Assist for transportation;A little help with bathing/dressing/bathroom ?  ?Equipment Recommendations ? BSC/3in1;Tub/shower seat  ?  ?Recommendations for Other Services   ? ?  ?Precautions / Restrictions Precautions ?Precautions: Back;Fall ?Precaution Booklet Issued: No ?Precaution Comments: back brace on for mobility ?Required Braces or  Orthoses: Spinal Brace ?Spinal Brace: Lumbar corset;Applied in sitting position ?Restrictions ?Weight Bearing Restrictions: No  ? ? ?  ? ?Mobility Bed Mobility ?Overal bed mobility: Needs Assistance ?  ?  ?  ?  ?  ?  ?General bed mobility comments: up in the recliner ?  ? ?Transfers ?Overall transfer level: Needs assistance ?Equipment used: Rolling walker (2 wheels) ?Transfers: Sit to/from Stand ?Sit to Stand: Min assist ?  ?  ?  ?  ?  ?  ?  ?  ?Balance Overall balance assessment: Needs assistance ?Sitting-balance support: Feet supported ?Sitting balance-Leahy Scale: Good ?  ?  ?Standing balance support: Reliant on assistive device for balance ?Standing balance-Leahy Scale: Poor ?  ?  ?  ?  ?  ?  ?  ?  ?  ?  ?  ?  ?   ? ?ADL either performed or assessed with clinical judgement  ? ?ADL Overall ADL's : Needs assistance/impaired ?Eating/Feeding: Independent;Sitting ?  ?Grooming: Wash/dry hands;Wash/dry face;Min guard;Standing ?  ?Upper Body Bathing: Moderate assistance;Sitting ?  ?Lower Body Bathing: Moderate assistance;Sit to/from stand ?  ?Upper Body Dressing : Moderate assistance;Sitting ?  ?Lower Body Dressing: Moderate assistance;Sit to/from stand ?  ?Toilet Transfer: Minimal assistance;Rolling walker (2 wheels) ?  ?  ?  ?  ?  ?  ?  ?  ? ?Extremity/Trunk Assessment Upper Extremity Assessment ?Upper Extremity Assessment: Overall WFL for tasks assessed ?  ?Lower Extremity Assessment ?Lower Extremity Assessment: Defer to PT evaluation ?  ?Cervical / Trunk Assessment ?Cervical / Trunk Assessment: Back Surgery ?  ? ?Vision   ?  ?  ?Perception   ?  ?Praxis   ?  ? ?Cognition Arousal/Alertness: Awake/alert ?Behavior During Therapy:  Flat affect ?Overall Cognitive Status: Within Functional Limits for tasks assessed ?  ?  ?  ?  ?  ?  ?  ?  ?  ?  ?  ?  ?  ?  ?  ?  ?  ?  ?  ?   ?Exercises   ? ?  ?Shoulder Instructions   ? ? ?  ?General Comments VSS on RA  ? ? ?Pertinent Vitals/ Pain       Pain Assessment ?Pain Assessment:  Faces ?Faces Pain Scale: Hurts little more ?Pain Location: neck and back ?Pain Descriptors / Indicators: Grimacing ?Pain Intervention(s): Monitored during session ? ?   ?  ?  ?  ?  ?  ?  ?  ?  ?  ?  ?  ?  ?  ?  ?  ?  ?  ?  ? ?  ?    ?  ?  ?  ?   ? ?Frequency ? Min 2X/week  ? ? ? ? ?  ?Progress Toward Goals ? ?OT Goals(current goals can now be found in the care plan section) ? Progress towards OT goals: Progressing toward goals ? ?Acute Rehab OT Goals ?OT Goal Formulation: With patient ?Time For Goal Achievement: 10/13/21 ?Potential to Achieve Goals: Fair  ?Plan Discharge plan needs to be updated   ? ?Co-evaluation ? ? ?   ?  ?  ?  ?  ? ?  ?AM-PAC OT "6 Clicks" Daily Activity     ?Outcome Measure ? ? Help from another person eating meals?: None ?Help from another person taking care of personal grooming?: A Little ?Help from another person toileting, which includes using toliet, bedpan, or urinal?: A Little ?Help from another person bathing (including washing, rinsing, drying)?: A Lot ?Help from another person to put on and taking off regular upper body clothing?: A Lot ?Help from another person to put on and taking off regular lower body clothing?: A Lot ?6 Click Score: 16 ? ?  ?End of Session Equipment Utilized During Treatment: Back brace ? ?OT Visit Diagnosis: Unsteadiness on feet (R26.81) ?  ?Activity Tolerance Patient tolerated treatment well ?  ?Patient Left in chair;with call bell/phone within reach ?  ?Nurse Communication Mobility status ?  ? ?   ? ?Time: 7824-2353 ?OT Time Calculation (min): 16 min ? ?Charges: OT General Charges ?$OT Visit: 1 Visit ?OT Treatments ?$Self Care/Home Management : 8-22 mins ? ?10/06/2021 ? ?RP, OTR/L ? ?Acute Rehabilitation Services ? ?Office:  [phone] ? ? ?Maitri Schnoebelen D Mendy Lapinsky ?10/06/2021, 11:35 AM ? ? ?

## 2021-10-06 NOTE — Progress Notes (Incomplete)
? ? ? [facility] ?                                                                            ? ? ?[name], is a 81 y.o. female, DOB - 1940/11/16, [id number] ?Admit date - 09/28/2021    ?Outpatient Primary MD for the patient is Burnard Bunting, MD ? ?LOS - 8  days ? ? ? ?Brief summary  ? ?No notes on file ? ? ?Assessment & Plan  ? ? ?Assessment and Plan: ?No notes have been filed under this hospital service. ?Service: Hospitalist ? ? ? ?  ? ? ?RN Pressure Injury Documentation: ?  ? ?Malnutrition Type: ? ?  ? ? ?Malnutrition Characteristics: ? ?  ? ? ?Nutrition Interventions: ? ?  ? ?Estimated body mass index is 28.98 kg/m? as calculated from the following: ?  Height as of this encounter: 5\' 7"  (1.702 m). ?  Weight as of this encounter: 83.9 kg. ? ?Code Status: *** ?DVT Prophylaxis:  enoxaparin (LOVENOX) injection 40 mg Start: 10/01/21 2200 ?SCD's Start: 09/28/21 1513 ? ? ?Level of Care: Level of care: Med-Surg ?Family Communication: Updated patient's *** ? ?Disposition Plan:     Remains inpatient appropriate:  ***  ? ?Procedures:  ?*** ? ?Consultants:   ?*** ? ?Antimicrobials:  ? ?Anti-infectives (From admission, onward)  ? ? Start     Dose/Rate Route Frequency Ordered Stop  ? 09/28/21 1800  ceFAZolin (ANCEF) IVPB 2g/100 mL premix       ? 2 g ?200 mL/hr over 30 Minutes Intravenous Every 8 hours 09/28/21 1512 10/03/21 0920  ? 09/28/21 0600  ceFAZolin (ANCEF) IVPB 2g/100 mL premix       ? 2 g ?200 mL/hr over 30 Minutes Intravenous On call to O.R. 09/28/21 0559 09/28/21 1214  ? ?  ? ? ? ?Medications ? ?Scheduled Meds: ? amLODipine  5 mg Oral QHS  ? donepezil  5 mg Oral QHS  ? enalapril  20 mg Oral BID  ? enoxaparin (LOVENOX) injection  40 mg Subcutaneous Q24H  ? estradiol  0.5 mg Oral Daily  ? ferrous sulfate  325 mg Oral Q breakfast  ? pantoprazole  40 mg Oral Q1200  ? polyethylene glycol  17 g Oral BID  ? pravastatin  20 mg Oral QHS  ? progesterone  100 mg Oral QHS  ? senna-docusate  2 tablet Oral  BID  ? sertraline  50 mg Oral Daily  ? sodium chloride flush  3 mL Intravenous Q12H  ? ?Continuous Infusions: ? sodium chloride    ? methocarbamol (ROBAXIN) IV    ? ?PRN Meds:.acetaminophen **OR** acetaminophen, alum & mag hydroxide-simeth, bisacodyl, gabapentin, HYDROcodone-acetaminophen, HYDROmorphone (DILAUDID) injection, menthol-cetylpyridinium **OR** phenol, methocarbamol **OR** methocarbamol (ROBAXIN) IV, methocarbamol, ondansetron **OR** ondansetron (ZOFRAN) IV, sodium chloride flush, sodium phosphate ? ? ? ?Subjective:  ? ?[name] was seen and examined today.  *** Patient denies dizziness, chest pain, shortness of breath, abdominal pain, N/V/D/C, new weakness, numbess, tingling. No acute events overnight.   ? ?Objective:  ? ?Vitals:  ? 10/06/21 0319 10/06/21 0807 10/06/21 1210 10/06/21 1632  ?BP: (!) 113/53 128/60 (!) 119/57 (!) 105/55  ?Pulse: 77 78 84 99  ?Resp:  18 18 18 18   ?Temp: 97.9 ?F (36.6 ?C) 97.6 ?F (36.4 ?C) 97.7 ?F (36.5 ?C) (!) 97.5 ?F (36.4 ?C)  ?TempSrc: Oral   Oral  ?SpO2: 96% 100% 94% 98%  ?Weight:      ?Height:      ? ? ?Intake/Output Summary (Last 24 hours) at 10/06/2021 1754 ?Last data filed at 10/06/2021 1640 ?Gross per 24 hour  ?Intake 240 ml  ?Output 500 ml  ?Net -260 ml  ? ?Filed Weights  ? 09/28/21 0616  ?Weight: 83.9 kg  ? ? ? ?Exam ?General: Alert and oriented x 3, NAD ?Cardiovascular: S1 S2 auscultated, no murmurs, RRR ?Respiratory: Clear to auscultation bilaterally, no wheezing, rales or rhonchi ?Gastrointestinal: Soft, nontender, nondistended, + bowel sounds ?Ext: no pedal edema bilaterally ?Neuro: AAOx3, Cr N's II- XII. Strength 5/5 upper and lower extremities bilaterally ?Skin: No rashes ?Psych: Normal affect and demeanor, alert and oriented x3  ? ? ?Data Reviewed:  I have personally reviewed following labs and imaging studies ? ? ?CBC ?Lab Results  ?Component Value Date  ? WBC 10.0 10/04/2021  ? RBC 3.22 (L) 10/04/2021  ? HGB 9.6 (L) 10/04/2021  ? HCT 29.8 (L) 10/04/2021  ?  MCV 92.5 10/04/2021  ? MCH 29.8 10/04/2021  ? PLT 334 10/04/2021  ? MCHC 32.2 10/04/2021  ? RDW 13.4 10/04/2021  ? LYMPHSABS 0.7 12/01/2006  ? MONOABS 0.6 12/01/2006  ? EOSABS 0.0 12/01/2006  ? BASOSABS 0.0 12/01/2006  ? ? ? ?Last metabolic panel ?Lab Results  ?Component Value Date  ? NA 134 (L) 10/04/2021  ? K 4.3 10/04/2021  ? CL 98 10/04/2021  ? CO2 26 10/04/2021  ? BUN 10 10/04/2021  ? CREATININE 0.69 10/04/2021  ? GLUCOSE 106 (H) 10/04/2021  ? GFRNONAA >60 10/04/2021  ? GFRAA >60 08/27/2019  ? CALCIUM 8.7 (L) 10/04/2021  ? PROT 5.3 (L) 10/02/2021  ? ALBUMIN 2.4 (L) 10/02/2021  ? BILITOT 0.5 10/02/2021  ? ALKPHOS 100 10/02/2021  ? AST 34 10/02/2021  ? ALT 14 10/02/2021  ? ANIONGAP 10 10/04/2021  ? ? ?CBG (last 3)  ?No results for input(s): GLUCAP in the last 72 hours.  ? ? ?Coagulation Profile: ?No results for input(s): INR, PROTIME in the last 168 hours. ? ? ?Radiology Studies: ?No results found. ? ? ? ? ?Hosie Poisson M.D. ?[facility] ?10/06/2021, 5:54 PM ? ?Available via Epic secure chat 7am-7pm ?After 7 pm, please refer to night coverage provider listed on amion. ? ? ? ?

## 2021-10-06 NOTE — TOC Progression Note (Signed)
Transition of Care (TOC) - Progression Note  ? ? ?Patient Details  ?Name: [name] ?MRN: [id number] ?Date of Birth: 1940-10-08 ? ?Transition of Care (TOC) CM/SW Contact  ?Vinie Sill, LCSW ?Phone Number: ?10/06/2021, 11:27 AM ? ?Clinical Narrative:    ? ?CSW met with patient at bedside. Patient remains agreeable to SNF placement. Patient accepted bed offer w/ [facility]. ? ?CSW explained she will need to work with OT for updated clinicals that will be sent to insurance for SNF approval. Patient states understanding.  ? ?CSW will continue to follow and assist with discharge planning.  ? ?Expected Discharge Plan: [place] ?Barriers to Discharge: Ship broker, SNF Pending bed offer ? ?Expected Discharge Plan and Services ?Expected Discharge Plan: [facility] ?  ?Discharge Planning Services: CM Consult ?  ?Living arrangements for the past 2 months: [facility] ?                ?  ?  ?  ?  ?  ?HH Arranged: PT, OT ?  ?  ?  ?  ? ? ?Social Determinants of Health (SDOH) Interventions ?  ? ?Readmission Risk Interventions ?   ? View : No data to display.  ?  ?  ?  ? ? ?

## 2021-10-06 NOTE — TOC Initial Note (Addendum)
Transition of Care (TOC) - Initial/Assessment Note  ? ? ?Patient Details  ?Name: [name] ?MRN: [id number] ?Date of Birth: 07-05-1940 ? ?Transition of Care (TOC) CM/SW Contact:    ?Eduard Roux, LCSW ?Phone Number: ?10/06/2021, 2:07 PM ? ?Clinical Narrative:                 ? ? ? ?Antony Blackbird, MSW, LCSW ?Clinical Social Worker ? ? ? ?Expected Discharge Plan: IP Rehab Facility ?Barriers to Discharge: English as a second language teacher, SNF Pending bed offer ? ? ?Patient Goals and CMS Choice ?  ?  ?  ? ?Expected Discharge Plan and Services ?Expected Discharge Plan: IP Rehab Facility ?  ?Discharge Planning Services: CM Consult ?  ?Living arrangements for the past 2 months: Single Family Home ?                ?  ?  ?  ?  ?  ?HH Arranged: PT, OT ?  ?  ?  ?  ? ?Prior Living Arrangements/Services ?Living arrangements for the past 2 months: Single Family Home ?Lives with:: Self ?Patient language and need for interpreter reviewed:: Yes ?       ?Need for Family Participation in Patient Care: Yes (Comment) ?Care giver support system in place?: Yes (comment) ?  ?Criminal Activity/Legal Involvement Pertinent to Current Situation/Hospitalization: No - Comment as needed ? ?Activities of Daily Living ?Home Assistive Devices/Equipment: Cane (specify quad or straight), Walker (specify type) ?ADL Screening (condition at time of admission) ?Patient's cognitive ability adequate to safely complete daily activities?: Yes ?Is the patient deaf or have difficulty hearing?: No ?Does the patient have difficulty seeing, even when wearing glasses/contacts?: No ?Does the patient have difficulty concentrating, remembering, or making decisions?: No ?Patient able to express need for assistance with ADLs?: Yes ?Does the patient have difficulty dressing or bathing?: No ?Independently performs ADLs?: Yes (appropriate for developmental age) ?Does the patient have difficulty walking or climbing stairs?: Yes ?Weakness of Legs: Both ?Weakness of Arms/Hands:  Both ? ?Permission Sought/Granted ?  ?  ?   ?   ?   ?   ? ?Emotional Assessment ?  ?Attitude/Demeanor/Rapport: Engaged ?Affect (typically observed): Pleasant ?Orientation: : Oriented to Self, Oriented to Place, Oriented to  Time, Oriented to Situation ?Alcohol / Substance Use: Not Applicable ?Psych Involvement: No (comment) ? ?Admission diagnosis:  Lumbar stenosis with neurogenic claudication [M48.062] ?Patient Active Problem List  ? Diagnosis Date Noted  ? Other spondylosis with radiculopathy, lumbar region 02/09/2021  ? Lumbar stenosis with neurogenic claudication 09/03/2019  ? Trochanteric bursitis, right hip 01/14/2019  ? Lumbar radiculopathy, chronic 06/29/2018  ? OA (osteoarthritis) of knee 09/19/2016  ? Palpitations 12/09/2015  ? Right carotid bruit 12/09/2015  ? Syncope 12/08/2015  ? Degenerative disc disease, lumbar 12/08/2015  ? Essential hypertension 12/08/2015  ? High cholesterol   ? Arthritis   ? ?PCP:  Geoffry Paradise, MD ?Pharmacy:   ?[id number] - [place], [place] - [address] ?[address] ? [place] [address] ?Phone: [phone] Fax: [phone] ? ? ? ? ?Social Determinants of Health (SDOH) Interventions ?  ? ?Readmission Risk Interventions ?   ? View : No data to display.  ?  ?  ?  ? ? ? ?

## 2021-10-06 NOTE — TOC Progression Note (Signed)
Transition of Care (TOC) - Progression Note  ? ? ?Patient Details  ?Name: [name] ?MRN: [id number] ?Date of Birth: Nov 01, 1940 ? ?Transition of Care (TOC) CM/SW Contact  ?Eduard Roux, LCSW ?Phone Number: ?10/06/2021, 2:05 PM ? ?Clinical Narrative:    ? ?Received [employer], broadcasting # [phone] ?Approval dates 05/03-05/05 ? ? ? ?Expected Discharge Plan: IP Rehab Facility ?Barriers to Discharge: English as a second language teacher, SNF Pending bed offer ? ?Expected Discharge Plan and Services ?Expected Discharge Plan: IP Rehab Facility ?  ?Discharge Planning Services: CM Consult ?  ?Living arrangements for the past 2 months: Single Family Home ?                ?  ?  ?  ?  ?  ?HH Arranged: PT, OT ?  ?  ?  ?  ? ? ?Social Determinants of Health (SDOH) Interventions ?  ? ?Readmission Risk Interventions ?   ? View : No data to display.  ?  ?  ?  ? ? ?

## 2021-10-06 NOTE — Progress Notes (Signed)
Pt discharged at this time. PIV removed, all belongings packed. Discharge information and medication script given to PTAR. This RN attempted to call [facility] for report with no response. Voicemail was left with callback number for report.  ? ?Robina Ade, RN ? ?

## 2021-10-06 NOTE — TOC Progression Note (Signed)
Transition of Care (TOC) - Progression Note  ? ? ?Patient Details  ?Name: [name] ?MRN: [id number] ?Date of Birth: June 07, 1940 ? ?Transition of Care (TOC) CM/SW Contact  ?Eduard Roux, LCSW ?Phone Number: ?10/06/2021, 12:52 PM ? ?Clinical Narrative:    ? ?CSW started insurance approval for SNF- reference # [id number] ? ? ? ? ?Expected Discharge Plan: IP Rehab Facility ?Barriers to Discharge: English as a second language teacher, SNF Pending bed offer ? ?Expected Discharge Plan and Services ?Expected Discharge Plan: IP Rehab Facility ?  ?Discharge Planning Services: CM Consult ?  ?Living arrangements for the past 2 months: Single Family Home ?                ?  ?  ?  ?  ?  ?HH Arranged: PT, OT ?  ?  ?  ?  ? ? ?Social Determinants of Health (SDOH) Interventions ?  ? ?Readmission Risk Interventions ?   ? View : No data to display.  ?  ?  ?  ? ? ?

## 2021-10-06 NOTE — Discharge Summary (Addendum)
Physician Discharge Summary  ?Patient ID: ?[name] ?MRN: [id number] ?DOB/AGE: May 01, 1941 81 y.o. ? ?Admit date: 09/28/2021 ?Discharge date: 10/06/2021 ? 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 ?Discharge Diagnoses: Spinal Stenosis T12-L1 with myelopathy. 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  Lumbar stenosis with neurogenic claudication ? ? ?Discharged Condition: fair ? ?Hospital Course: Patient tolerated surgery well but needs assistance with ADLs and mobility ? ?Consults: None ? ?Significant Diagnostic Studies: none ? ?Treatments: surgery: see op note ? ?Discharge Exam: ?Blood pressure (!) 119/57, pulse 84, temperature 97.7 ?F (36.5 ?C), resp. rate 18, height 5\' 7"  (1.702 m), weight 83.9 kg, last menstrual period 06/06/1996, SpO2 94 %. ?Incision is clean and dry, motor strength is good  mobilizes slowly. ? ?Disposition: Discharge disposition: 03-Skilled Nursing Facility ? ? ? ? ? ? ?Discharge Instructions   ? ? Call MD for:  redness, tenderness, or signs of infection (pain, swelling, redness, odor or green/yellow discharge around incision site)   Complete by: As directed ?  ? Call MD for:  severe uncontrolled pain   Complete by: As directed ?  ? Call MD for:  temperature >100.4   Complete by: As directed ?  ? Diet - low sodium heart healthy   Complete by: As directed ?  ? Discharge wound care:   Complete by: As directed ?  ? Patient may shower. Place dry dressing only if there is any bleed through from the incision.  ? Incentive spirometry RT   Complete by: As directed ?  ? Increase activity slowly   Complete by: As directed ?  ? ?  ? ?Allergies as of 10/06/2021   ?No Known Allergies ?  ? ?  ?Medication List  ?  ? ?TAKE these medications   ? ?amLODipine 5 MG tablet ?Commonly known as: NORVASC ?Take 5 mg by mouth at bedtime. ?  ?donepezil 5 MG tablet ?Commonly known as: ARICEPT ?Take 1 tablet by mouth at bedtime. ?  ?enalapril 20 MG  tablet ?Commonly known as: VASOTEC ?Take 20 mg by mouth 2 (two) times daily. ?  ?estradiol 0.5 MG tablet ?Commonly known as: ESTRACE ?Take one half tablet (0.25 mg) by mouth daily. ?What changed:  ?how much to take ?how to take this ?when to take this ?additional instructions ?  ?gabapentin 300 MG capsule ?Commonly known as: NEURONTIN ?Take 300 mg by mouth daily as needed (Nerve pain). ?  ?HYDROcodone-acetaminophen 5-325 MG tablet ?Commonly known as: NORCO/VICODIN ?Take 1 tablet by mouth daily as needed for moderate pain or severe pain. ?What changed: Another medication with the same name was added. Make sure you understand how and when to take each. ?  ?HYDROcodone-acetaminophen 5-325 MG tablet ?Commonly known as: NORCO/VICODIN ?Take 1 tablet by mouth every 4 (four) hours as needed for moderate pain or severe pain. ?What changed: You were already taking a medication with the same name, and this prescription was added. Make sure you understand how and when to take each. ?  ?meloxicam 15 MG tablet ?Commonly known as: MOBIC ?Take 15 mg by mouth daily. ?  ?methocarbamol 500 MG tablet ?Commonly known as: ROBAXIN ?Take 1 tablet (500 mg total) by mouth every 6 (six) hours as needed for muscle spasms. ?What changed: Another medication with the same name was added. Make sure you understand how and when to take each. ?  ?methocarbamol 500 MG tablet ?Commonly known as: ROBAXIN ?Take 1 tablet (500 mg total) by mouth  every 6 (six) hours as needed for muscle spasms. ?What changed: You were already taking a medication with the same name, and this prescription was added. Make sure you understand how and when to take each. ?  ?pravastatin 20 MG tablet ?Commonly known as: PRAVACHOL ?Take 20 mg by mouth at bedtime. ?  ?progesterone 100 MG capsule ?Commonly known as: Prometrium ?Take 1 capsule (100 mg total) by mouth daily. ?What changed: when to take this ?  ?sertraline 50 MG tablet ?Commonly known as: ZOLOFT ?Take 50 mg by mouth  daily. ?  ?Vitamin D3 50 MCG (2000 UT) capsule ?Take 2,000 Units by mouth daily. ?  ? ?  ? ?  ?  ? ? ?  ?Discharge Care Instructions  ?(From admission, onward)  ?  ? ? ?  ? ?  Start     Ordered  ? 10/06/21 0000  Discharge wound care:       ?Comments: Patient may shower. Place dry dressing only if there is any bleed through from the incision.  ? 10/06/21 1422  ? ?  ?  ? ?  ? ? ? ?Signed: ?[name] ?10/06/2021, 3:21 PM ? ? ?

## 2021-10-06 NOTE — TOC Transition Note (Addendum)
Transition of Care (TOC) - CM/SW Discharge Note ? ? ?Patient Details  ?Name: [name] ?MRN: [id number] ?Date of Birth: [birth year] ? ?Transition of Care (TOC) CM/SW Contact:  ?Loreda Silverio Aris Lot, LCSW ?Phone Number: ?10/06/2021, 3:38 PM ? ? ?Clinical Narrative:    ? ?PASSR Received: [id number] A ? ?Patient will DC to: [facility] ?Anticipated DC date: 10/06/21 ?Family notified: Pt states she notified family/friends ?Transport by: Sharin Mons ? ? ?Per MD patient ready for DC to [facility]. RN, patient, and facility notified of DC. Discharge Summary and FL2 sent to facility. RN to call report prior to discharge [phone]). DC packet on chart. Ambulance transport requested for patient.  ? ?CSW will sign off for now as social work intervention is no longer needed. Please consult [place] again if new needs arise.  ? ?  ?Barriers to Discharge: No Barriers Identified ? ? ?Patient Goals and CMS Choice ?  ?  ?  ? ?Discharge Placement ?  ?           ?Patient chooses bed at: [facility] ?Patient to be transferred to facility by: PTAR ?Name of family member notified: Pt states she notified family/friends ?Patient and family notified of of transfer: 10/06/21 ? ?Discharge Plan and Services ?  ?Discharge Planning Services: CM Consult ?           ?  ?  ?  ?  ?  ?HH Arranged: PT, OT ?  ?  ?  ?  ? ?Social Determinants of Health (SDOH) Interventions ?  ? ? ?Readmission Risk Interventions ?   ? View : No data to display.  ?  ?  ?  ? ? ? ? ? ?

## 2021-10-07 ENCOUNTER — Other Ambulatory Visit: Payer: Self-pay | Admitting: Obstetrics and Gynecology

## 2021-10-12 ENCOUNTER — Other Ambulatory Visit: Payer: Self-pay

## 2021-10-29 ENCOUNTER — Emergency Department (HOSPITAL_COMMUNITY): Payer: Medicare Other

## 2021-10-29 ENCOUNTER — Encounter (HOSPITAL_COMMUNITY): Payer: Self-pay

## 2021-10-29 ENCOUNTER — Inpatient Hospital Stay (HOSPITAL_COMMUNITY)
Admission: EM | Admit: 2021-10-29 | Discharge: 2021-11-08 | DRG: 982 | Disposition: A | Discharge status: Skilled Nursing Facility | Payer: Medicare Other | Attending: Internal Medicine | Admitting: Internal Medicine

## 2021-10-29 ENCOUNTER — Observation Stay (HOSPITAL_COMMUNITY): Payer: Medicare Other

## 2021-10-29 ENCOUNTER — Other Ambulatory Visit: Payer: Self-pay

## 2021-10-29 DIAGNOSIS — Z981 Arthrodesis status: Secondary | ICD-10-CM

## 2021-10-29 DIAGNOSIS — A419 Sepsis, unspecified organism: Secondary | ICD-10-CM | POA: Diagnosis not present

## 2021-10-29 DIAGNOSIS — N179 Acute kidney failure, unspecified: Secondary | ICD-10-CM | POA: Diagnosis present

## 2021-10-29 DIAGNOSIS — E8809 Other disorders of plasma-protein metabolism, not elsewhere classified: Secondary | ICD-10-CM | POA: Diagnosis present

## 2021-10-29 DIAGNOSIS — E785 Hyperlipidemia, unspecified: Secondary | ICD-10-CM | POA: Diagnosis present

## 2021-10-29 DIAGNOSIS — Z6828 Body mass index (BMI) 28.0-28.9, adult: Secondary | ICD-10-CM

## 2021-10-29 DIAGNOSIS — Z885 Allergy status to narcotic agent status: Secondary | ICD-10-CM

## 2021-10-29 DIAGNOSIS — Z96652 Presence of left artificial knee joint: Secondary | ICD-10-CM | POA: Diagnosis present

## 2021-10-29 DIAGNOSIS — E876 Hypokalemia: Secondary | ICD-10-CM | POA: Diagnosis not present

## 2021-10-29 DIAGNOSIS — D649 Anemia, unspecified: Secondary | ICD-10-CM | POA: Diagnosis not present

## 2021-10-29 DIAGNOSIS — D509 Iron deficiency anemia, unspecified: Secondary | ICD-10-CM | POA: Diagnosis present

## 2021-10-29 DIAGNOSIS — Z823 Family history of stroke: Secondary | ICD-10-CM

## 2021-10-29 DIAGNOSIS — M48062 Spinal stenosis, lumbar region with neurogenic claudication: Secondary | ICD-10-CM

## 2021-10-29 DIAGNOSIS — F0394 Unspecified dementia, unspecified severity, with anxiety: Secondary | ICD-10-CM | POA: Diagnosis present

## 2021-10-29 DIAGNOSIS — R531 Weakness: Secondary | ICD-10-CM

## 2021-10-29 DIAGNOSIS — E663 Overweight: Secondary | ICD-10-CM | POA: Diagnosis present

## 2021-10-29 DIAGNOSIS — I10 Essential (primary) hypertension: Secondary | ICD-10-CM | POA: Diagnosis not present

## 2021-10-29 DIAGNOSIS — Z791 Long term (current) use of non-steroidal anti-inflammatories (NSAID): Secondary | ICD-10-CM

## 2021-10-29 DIAGNOSIS — Z800 Family history of malignant neoplasm of digestive organs: Secondary | ICD-10-CM

## 2021-10-29 DIAGNOSIS — F0393 Unspecified dementia, unspecified severity, with mood disturbance: Secondary | ICD-10-CM | POA: Diagnosis present

## 2021-10-29 DIAGNOSIS — Z602 Problems related to living alone: Secondary | ICD-10-CM | POA: Diagnosis present

## 2021-10-29 DIAGNOSIS — Z833 Family history of diabetes mellitus: Secondary | ICD-10-CM

## 2021-10-29 DIAGNOSIS — K529 Noninfective gastroenteritis and colitis, unspecified: Principal | ICD-10-CM | POA: Diagnosis present

## 2021-10-29 DIAGNOSIS — M8008XA Age-related osteoporosis with current pathological fracture, vertebra(e), initial encounter for fracture: Secondary | ICD-10-CM | POA: Diagnosis present

## 2021-10-29 DIAGNOSIS — G629 Polyneuropathy, unspecified: Secondary | ICD-10-CM | POA: Diagnosis present

## 2021-10-29 DIAGNOSIS — Z803 Family history of malignant neoplasm of breast: Secondary | ICD-10-CM

## 2021-10-29 DIAGNOSIS — Z79899 Other long term (current) drug therapy: Secondary | ICD-10-CM

## 2021-10-29 DIAGNOSIS — I951 Orthostatic hypotension: Secondary | ICD-10-CM | POA: Diagnosis present

## 2021-10-29 LAB — URINALYSIS, ROUTINE W REFLEX MICROSCOPIC
Bilirubin Urine: NEGATIVE
Glucose, UA: NEGATIVE mg/dL
Ketones, ur: 5 mg/dL — AB
Leukocytes,Ua: NEGATIVE
Nitrite: NEGATIVE
Protein, ur: NEGATIVE mg/dL
Specific Gravity, Urine: 1.011 (ref 1.005–1.030)
pH: 5 (ref 5.0–8.0)

## 2021-10-29 LAB — COMPREHENSIVE METABOLIC PANEL
ALT: 12 U/L (ref 0–44)
AST: 20 U/L (ref 15–41)
Albumin: 3.4 g/dL — ABNORMAL LOW (ref 3.5–5.0)
Alkaline Phosphatase: 113 U/L (ref 38–126)
Anion gap: 6 (ref 5–15)
BUN: 21 mg/dL (ref 8–23)
CO2: 21 mmol/L — ABNORMAL LOW (ref 22–32)
Calcium: 9.2 mg/dL (ref 8.9–10.3)
Chloride: 111 mmol/L (ref 98–111)
Creatinine, Ser: 1.11 mg/dL — ABNORMAL HIGH (ref 0.44–1.00)
GFR, Estimated: 50 mL/min — ABNORMAL LOW (ref >60)
Glucose, Bld: 137 mg/dL — ABNORMAL HIGH (ref 70–99)
Potassium: 4.9 mmol/L (ref 3.5–5.1)
Sodium: 138 mmol/L (ref 135–145)
Total Bilirubin: 0.5 mg/dL (ref 0.3–1.2)
Total Protein: 6.6 g/dL (ref 6.5–8.1)

## 2021-10-29 LAB — CBC WITH DIFFERENTIAL/PLATELET
Abs Immature Granulocytes: 0.04 10*3/uL (ref 0.00–0.07)
Basophils Absolute: 0 10*3/uL (ref 0.0–0.1)
Basophils Relative: 0 %
Eosinophils Absolute: 0 10*3/uL (ref 0.0–0.5)
Eosinophils Relative: 0 %
HCT: 29.4 % — ABNORMAL LOW (ref 36.0–46.0)
Hemoglobin: 9.1 g/dL — ABNORMAL LOW (ref 12.0–15.0)
Immature Granulocytes: 1 %
Lymphocytes Relative: 8 %
Lymphs Abs: 0.6 10*3/uL — ABNORMAL LOW (ref 0.7–4.0)
MCH: 29.7 pg (ref 26.0–34.0)
MCHC: 31 g/dL (ref 30.0–36.0)
MCV: 96.1 fL (ref 80.0–100.0)
Monocytes Absolute: 0.9 10*3/uL (ref 0.1–1.0)
Monocytes Relative: 12 %
Neutro Abs: 5.6 10*3/uL (ref 1.7–7.7)
Neutrophils Relative %: 79 %
Platelets: 211 10*3/uL (ref 150–400)
RBC: 3.06 MIL/uL — ABNORMAL LOW (ref 3.87–5.11)
RDW: 14.5 % (ref 11.5–15.5)
WBC: 7.1 10*3/uL (ref 4.0–10.5)
nRBC: 0 % (ref 0.0–0.2)

## 2021-10-29 LAB — LACTIC ACID, PLASMA
Lactic Acid, Venous: 1.2 mmol/L (ref 0.5–1.9)
Lactic Acid, Venous: 1.6 mmol/L (ref 0.5–1.9)

## 2021-10-29 MED ORDER — METRONIDAZOLE 500 MG/100ML IV SOLN
500.0000 mg | Freq: Once | INTRAVENOUS | Status: AC
  Administered 2021-10-29: 500 mg via INTRAVENOUS
  Filled 2021-10-29: qty 100

## 2021-10-29 MED ORDER — MORPHINE SULFATE (PF) 4 MG/ML IV SOLN
4.0000 mg | Freq: Once | INTRAVENOUS | Status: AC
  Administered 2021-10-29: 4 mg via INTRAVENOUS
  Filled 2021-10-29: qty 1

## 2021-10-29 MED ORDER — SODIUM CHLORIDE 0.9 % IV SOLN
2.0000 g | Freq: Once | INTRAVENOUS | Status: AC
  Administered 2021-10-29: 2 g via INTRAVENOUS
  Filled 2021-10-29: qty 12.5

## 2021-10-29 MED ORDER — FENTANYL CITRATE PF 50 MCG/ML IJ SOSY
25.0000 ug | PREFILLED_SYRINGE | INTRAMUSCULAR | Status: DC | PRN
  Administered 2021-10-31 – 2021-11-05 (×6): 25 ug via INTRAVENOUS
  Filled 2021-10-29 (×6): qty 1

## 2021-10-29 MED ORDER — CEFTRIAXONE SODIUM 2 G IJ SOLR
2.0000 g | INTRAMUSCULAR | Status: DC
  Administered 2021-10-30 – 2021-11-05 (×7): 2 g via INTRAVENOUS
  Filled 2021-10-29 (×7): qty 20

## 2021-10-29 MED ORDER — METRONIDAZOLE 500 MG/100ML IV SOLN
500.0000 mg | Freq: Two times a day (BID) | INTRAVENOUS | Status: DC
  Administered 2021-10-30 – 2021-11-05 (×13): 500 mg via INTRAVENOUS
  Filled 2021-10-29 (×13): qty 100

## 2021-10-29 MED ORDER — ONDANSETRON HCL 4 MG PO TABS: 4.0000 mg | ORAL_TABLET | Freq: Four times a day (QID) | ORAL | Status: DC | PRN

## 2021-10-29 MED ORDER — MORPHINE SULFATE (PF) 2 MG/ML IV SOLN
2.0000 mg | INTRAVENOUS | Status: DC | PRN
Start: 2021-10-29 — End: 2021-10-29
  Filled 2021-10-29: qty 1

## 2021-10-29 MED ORDER — ONDANSETRON HCL 4 MG/2ML IJ SOLN
4.0000 mg | Freq: Once | INTRAMUSCULAR | Status: AC
  Administered 2021-10-29: 4 mg via INTRAVENOUS
  Filled 2021-10-29: qty 2

## 2021-10-29 MED ORDER — LACTATED RINGERS IV BOLUS
1000.0000 mL | Freq: Once | INTRAVENOUS | Status: AC
  Administered 2021-10-29: 1000 mL via INTRAVENOUS

## 2021-10-29 MED ORDER — LACTATED RINGERS IV SOLN: INTRAVENOUS | Status: AC

## 2021-10-29 MED ORDER — ACETAMINOPHEN 650 MG RE SUPP: 650.0000 mg | Freq: Four times a day (QID) | RECTAL | Status: DC | PRN

## 2021-10-29 MED ORDER — VANCOMYCIN HCL IN DEXTROSE 1-5 GM/200ML-% IV SOLN: 1000.0000 mg | Freq: Once | INTRAVENOUS | Status: DC

## 2021-10-29 MED ORDER — ONDANSETRON HCL 4 MG/2ML IJ SOLN
4.0000 mg | Freq: Four times a day (QID) | INTRAMUSCULAR | Status: DC | PRN
  Administered 2021-10-30 – 2021-11-02 (×5): 4 mg via INTRAVENOUS
  Filled 2021-10-29 (×5): qty 2

## 2021-10-29 MED ORDER — VANCOMYCIN HCL 1500 MG/300ML IV SOLN
1500.0000 mg | Freq: Once | INTRAVENOUS | Status: AC
  Administered 2021-10-29: 1500 mg via INTRAVENOUS
  Filled 2021-10-29: qty 300

## 2021-10-29 MED ORDER — KETOROLAC TROMETHAMINE 15 MG/ML IJ SOLN
15.0000 mg | Freq: Once | INTRAMUSCULAR | Status: AC
  Administered 2021-10-29: 15 mg via INTRAVENOUS
  Filled 2021-10-29: qty 1

## 2021-10-29 MED ORDER — METOCLOPRAMIDE HCL 5 MG/ML IJ SOLN
10.0000 mg | Freq: Once | INTRAMUSCULAR | Status: AC
Start: 2021-10-29 — End: 2021-10-29
  Administered 2021-10-29: 10 mg via INTRAVENOUS
  Filled 2021-10-29: qty 2

## 2021-10-29 MED ORDER — IOHEXOL 300 MG/ML  SOLN
100.0000 mL | Freq: Once | INTRAMUSCULAR | Status: AC | PRN
  Administered 2021-10-29: 100 mL via INTRAVENOUS

## 2021-10-29 MED ORDER — ACETAMINOPHEN 325 MG PO TABS
650.0000 mg | ORAL_TABLET | Freq: Four times a day (QID) | ORAL | Status: DC | PRN
  Administered 2021-11-01: 650 mg via ORAL
  Filled 2021-10-29 (×2): qty 2

## 2021-10-29 NOTE — ED Triage Notes (Signed)
Pt arrived via EMS, from home. C/o weakness, recent back surgery, states worsening wkn since then. Hypotensive on ems arrival after sitting up.

## 2021-10-29 NOTE — Assessment & Plan Note (Addendum)
Following lumbar spinal surgery last month. DDx includes: 1) Symptomatic anemia: would explain presentation today including: generalized weakness, orthostatic hypotension, mild tachycardia, pulm vasc congestion on CXR despite no PMH of CHF (seems most likely the culprit, see anemia below) 2) infection: no WBC, could explain the anemia though.  Usually would expect post-op infection to onset a bit faster.  But imaging should help evaluate for this.  Reportedly had fever 100.4 in ED per EDP note. 3) PE: could explain fever, wouldn't explain anemia though, and she would have had an unusually good response to IVF bolus for a PE. 4) Spinal cord compression: wouldn't really explain the anemia, ortho stasis, tachycardia a month later though; also wouldn't explain the generalized weakness not localized to legs, also no report of neurogenic bladder like symptoms. (MRI should be able to rule this out). 5) new onset CHF: why ortho stasis if volume overloaded though?

## 2021-10-29 NOTE — Assessment & Plan Note (Addendum)
Orthostatic hypotension today. 1) holding home BP meds for the moment

## 2021-10-29 NOTE — Assessment & Plan Note (Signed)
Recent spinal fusion surgery at end of March 1. MRI of T and L spine is pending

## 2021-10-29 NOTE — Assessment & Plan Note (Addendum)
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 1. Will de-escalate ABx to rocephin + flagyl 2. EDP has ordered c.diff 3. Will add on GI pathogen pnl 4. Has had a couple of BMs since being in ED, though apparently not diarrhea per patient.

## 2021-10-29 NOTE — ED Notes (Signed)
Pt. Placed on 2 L/min O2 via [place] for O2 sat of 86% RA.

## 2021-10-29 NOTE — Sepsis Progress Note (Deleted)
Notified bedside nurse of need to draw blood cultures as soon as able.Pt received cefepime at 1713.

## 2021-10-29 NOTE — Progress Notes (Signed)
A consult was received from an ED physician for vancomycin and cefepime per pharmacy dosing.  The patient's profile has been reviewed for ht/wt/allergies/indication/available labs.   A one time order has been placed for vancomycin 1500mg  IV x1 and cefepime 2g IV x1.   Further antibiotics/pharmacy consults should be ordered by admitting physician if indicated.                       Thank you,  , PharmD 10/29/2021 4:50 PM

## 2021-10-29 NOTE — Assessment & Plan Note (Addendum)
Looks like this onset a month ago after lumbar surgery. HGB 9.1 today compared to 9.6 on DC. Not clear why its persistent (and possibly worsening) though a month later?! Ongoing blood loss? 1. Getting CT AP 2. Getting MRI of T and L spine 3. Getting hemoccult, but no frank melena nor hematochezia

## 2021-10-29 NOTE — Assessment & Plan Note (Addendum)
Question of sepsis: Pt does have tachycardia. Apparently fever 100.4 in ED per EDP note. 1. Got empiric cefepime, flagyl, vanc in ED 2. Got 1L LR bolus and LR going at 125 for the moment 3. BP improved to [id number] systolic after fluid bolus (now [id number] systolic) 4. Lactate nl 5. Checking procalcitonin 6. BCx pending 7. CXR neg 8. UA neg 9. CT AP pending 10. MRI L and T spine pending

## 2021-10-29 NOTE — ED Provider Notes (Signed)
Patient initially seen by Dr. Anitra Lauth.  Please see her note.  Plan was for admission to the hospital.  Urinalysis was pending at the time of shift change.  No definite signs of UTI.  With her worsening back pain concern is for possible infection associated with her surgery.  I have ordered MRI thoracic and lumbar spine.  I will consult the medical service for admission.  Patient does continue to have nausea vomiting pain and is being treated empirically for possible sepsis.  Discussed with Dr Roosevelt Locks, MD 10/29/21 1945

## 2021-10-29 NOTE — ED Provider Notes (Signed)
[facility]-EMERGENCY DEPT Provider Note   CSN: [id number] Arrival date & time: 10/29/21  1225     History  Chief Complaint  Patient presents with   Weakness    [name] is a 81 y.o. female.  Pt is an 81y/o female with hx of hypertension, hyperlipidemia, chronic back issues who presented to [facility] for severe back pain secondary to severe spinal stenosis and underwent posterior decompression and discectomy of T12-L1 along with posterior lumbar interbody fusion on 09/28/2021 ongoing back pain, generalized weakness and near syncope today.  Patient reports that she has been having pain that she takes hydrocodone for it it is more painful when she gets up and moves around but today her brother was there as she lives alone and has not received any physical therapy since d/c from rehab last week and reported she took her walker and went to the bathroom and as she was coming out her legs buckled and she became very weak, hot and kept bending over and trying to sit downt.  EMS reports when they arrived patient's blood pressure was 80s systolic.  Patient reports she has been taking her medication which includes blood pressure medication and she has not had a great appetite.  She has been urinating and having bowel movements and denies any dysuria frequency or urgency.  She complains of severe back pain which seems worse today than baseline.  She did take a hydrocodone this morning.  She denies any specific weakness in her legs but more general weakness.  No chest pain, shortness of breath or palpitations.  The history is provided by the patient, medical records and a relative.  Weakness     Home Medications Prior to Admission medications   Medication Sig Start Date End Date Taking? Authorizing Provider  amLODipine (NORVASC) 5 MG tablet Take 5 mg by mouth at bedtime.    [provider]  Cholecalciferol (VITAMIN D3) 50 MCG (2000 UT) capsule Take 2,000 Units by mouth  daily.    [provider]  donepezil (ARICEPT) 5 MG tablet Take 1 tablet by mouth at bedtime.    [provider]  enalapril (VASOTEC) 20 MG tablet Take 20 mg by mouth 2 (two) times daily.    [provider]  estradiol (ESTRACE) 0.5 MG tablet Take one half tablet (0.25 mg) by mouth daily. Patient taking differently: Take 0.5 mg by mouth daily. 07/07/21   Patton Salles, MD  gabapentin (NEURONTIN) 300 MG capsule Take 300 mg by mouth daily as needed (Nerve pain). 01/28/20   [provider]  HYDROcodone-acetaminophen (NORCO/VICODIN) 5-325 MG tablet Take 1 tablet by mouth daily as needed for moderate pain or severe pain. 04/20/21   [provider]  HYDROcodone-acetaminophen (NORCO/VICODIN) 5-325 MG tablet Take 1 tablet by mouth every 4 (four) hours as needed for moderate pain or severe pain. 10/06/21   Meyran, Tiana Loft, NP  meloxicam (MOBIC) 15 MG tablet Take 15 mg by mouth daily.    [provider]  methocarbamol (ROBAXIN) 500 MG tablet Take 1 tablet (500 mg total) by mouth every 6 (six) hours as needed for muscle spasms. 09/04/19   Barnett Abu, MD  methocarbamol (ROBAXIN) 500 MG tablet Take 1 tablet (500 mg total) by mouth every 6 (six) hours as needed for muscle spasms. 10/06/21   Meyran, Tiana Loft, NP  pravastatin (PRAVACHOL) 20 MG tablet Take 20 mg by mouth at bedtime.    [provider]  progesterone (PROMETRIUM)  100 MG capsule Take 1 capsule (100 mg total) by mouth daily. Patient taking differently: Take 100 mg by mouth at bedtime. 07/07/21   Patton Salles, MD  sertraline (ZOLOFT) 50 MG tablet Take 50 mg by mouth daily.    [provider]      Allergies    Patient has no known allergies.    Review of Systems   Review of Systems  Neurological:  Positive for weakness.   Physical Exam Updated Vital Signs BP 130/72   Pulse 71   Temp (!) 97.4 F (36.3 C) (Oral)   Resp 18   LMP 06/06/1996    SpO2 98%  Physical Exam Vitals and nursing note reviewed.  Constitutional:      General: She is in acute distress.     Appearance: She is well-developed.  HENT:     Head: Normocephalic and atraumatic.  Eyes:     Pupils: Pupils are equal, round, and reactive to light.  Cardiovascular:     Rate and Rhythm: Normal rate and regular rhythm.     Heart sounds: Normal heart sounds. No murmur heard.   No friction rub.  Pulmonary:     Effort: Pulmonary effort is normal.     Breath sounds: Normal breath sounds. No wheezing or rales.  Abdominal:     General: Bowel sounds are normal. There is no distension.     Palpations: Abdomen is soft.     Tenderness: There is no abdominal tenderness. There is no guarding or rebound.  Musculoskeletal:        General: No tenderness. Normal range of motion.     Comments: No edema.  Glue present over the mid lumbar spine without drainage or significant erythema.  Only some mild erythema at the very base of the surgical wound  Skin:    General: Skin is warm and dry.     Coloration: Skin is pale.     Findings: No rash.  Neurological:     Mental Status: She is alert and oriented to person, place, and time. Mental status is at baseline.     Cranial Nerves: No cranial nerve deficit.     Comments: 4 out of 5 strength in bilateral lower extremities with normal sensation  Psychiatric:        Mood and Affect: Mood normal.        Behavior: Behavior normal.    ED Results / Procedures / Treatments   Labs (all labs ordered are listed, but only abnormal results are displayed) Labs Reviewed  CBC WITH DIFFERENTIAL/PLATELET - Abnormal; Notable for the following components:      Result Value   RBC 3.06 (*)    Hemoglobin 9.1 (*)    HCT 29.4 (*)    Lymphs Abs 0.6 (*)    All other components within normal limits  COMPREHENSIVE METABOLIC PANEL - Abnormal; Notable for the following components:   CO2 21 (*)    Glucose, Bld 137 (*)    Creatinine, Ser 1.11 (*)     Albumin 3.4 (*)    GFR, Estimated 50 (*)    All other components within normal limits  LACTIC ACID, PLASMA  URINALYSIS, ROUTINE W REFLEX MICROSCOPIC  LACTIC ACID, PLASMA    EKG EKG Interpretation  Date/Time:  Friday Oct 29 2021 12:48:38 EDT Ventricular Rate:  69 PR Interval:  170 QRS Duration: 111 QT Interval:  403 QTC Calculation: 432 R Axis:   -39 Text Interpretation: Sinus rhythm Incomplete left bundle  branch block Low voltage, precordial leads Consider anterior infarct No significant change since last tracing Confirmed by Gwyneth Sprout ([id number]) on 10/29/2021 1:24:32 PM  Radiology DG Chest Port 1 View  Result Date: 10/29/2021 CLINICAL DATA:  Near syncope. EXAM: PORTABLE CHEST 1 VIEW COMPARISON:  Two-view chest x-ray 02/09/2010 FINDINGS: Heart size exaggerated by low lung volumes. Atherosclerotic changes are present at the aortic arch. Pulmonary vascular congestion is present without frank edema. No effusions are present. No focal airspace consolidation is present. IMPRESSION: 1. Low lung volumes. 2. Pulmonary vascular congestion without frank edema. Electronically Signed   By: Marin Roberts M.D.   On: 10/29/2021 13:01    Procedures Procedures    Medications Ordered in ED Medications  morphine (PF) 4 MG/ML injection 4 mg (4 mg Intravenous Given 10/29/21 1311)  ondansetron (ZOFRAN) injection 4 mg (4 mg Intravenous Given 10/29/21 1310)  lactated ringers bolus 1,000 mL (1,000 mLs Intravenous New Bag/Given 10/29/21 1316)    ED Course/ Medical Decision Making/ A&P                           Medical Decision Making Amount and/or Complexity of Data Reviewed Labs: ordered. Radiology: ordered.  Risk Prescription drug management.   Pt with multiple medical problems and comorbidities and presenting today with a complaint that caries a high risk for morbidity and mortality.  Presenting today with worsening back pain, generalized weakness, hypotension and near syncope.   Patient has no respiratory or cardiac complaints at this time and does take blood pressure medication at home.  She has not received physical therapy services at home but has been getting up and moving around.  She reports she just does not have much of an appetite.  No focal abdominal pain at this time.  Patient is able to lift her legs off the bed but does seem to have severe back pain.  Glue is still intact over surgical site which was approximately 1 month ago but no significant redness or drainage.  Patient denies any fevers at home.  Patient found to be hypotensive here at 97/47 was given a bolus of fluid.  She has no prior history of heart failure and does not have significant evidence for fluid overload at this time.  Concern for dehydration, AKI, electrolyte abnormalities, lower suspicion for infectious etiology.  Low suspicion for postsurgical infection of the spine.  No evidence of dysrhythmia on cardiac monitoring which she is on continually.  Patient given pain control.  Labs and imaging are pending. I independently interpreted patient's labs and EKG.  EKG without new changes with a chronic bundle branch block and normal sinus rhythm.  CBC with stable hemoglobin of 9 and normal white count, lactic acid within normal limits and CMP with AKI with creatinine of 1.11 from baseline of 0.6.  After IV fluids patient's blood pressure has improved. I have independently visualized and interpreted pt's images today.  Chest x-ray today without evidence of cardiomegaly or pneumonia.  Radiology report low lung volumes and pulmonary vascular congestion without frank edema most likely from poor inspiratory film.  After IV fluids patient's blood pressure is improved to 130/72.  3:48 PM Pt's sister in law is now also present and both she and the pt's brother report that she got out of rehab on Friday and since that time she has been very sedentary in her house and no services have been provided.  Her brother reports  he last saw her  on Wednesday and he has been staying with her at the house for hours at a time and she just sits and is not getting up and moving around.  However today she was much worse and that is when she had a near syncopal event.  Patient's blood pressure has improved with IV fluids.  However unclear that patient is able to even get up or ambulate.  She is still complaining of back pain and giving the erythema at the bottom of the incision she may need repeat imaging to ensure no acute findings.  Lactic acid is within normal limits, UA is still pending.  Will attempt to ambulate the patient  4:46 PM Patient is now starting to have emesis and nausea.  She now feels warm and temperature is 100.4.  Sepsis order set now initiated.  Urine is still pending.  Feel that patient will need admission given her weakness, now fever and vomiting.  Antibiotics were initiated for unknown source.          Final Clinical Impression(s) / ED Diagnoses Final diagnoses:  None    Rx / DC Orders ED Discharge Orders     None         Gwyneth SproutPlunkett, Jullia Mulligan, MD 10/29/21 [phone]

## 2021-10-29 NOTE — Sepsis Progress Note (Signed)
Code Sepsis protocol being monitored by eLink. 

## 2021-10-29 NOTE — H&P (Addendum)
History and Physical    Patient: [name] YHC:[id number] DOB: 03-Feb-1941 DOA: 10/29/2021 DOS: the patient was seen and examined on 10/29/2021 PCP: Burnard Bunting, MD  Patient coming from: Home  Chief Complaint:  Chief Complaint  Patient presents with   Weakness   HPI: [name] is a 81 y.o. female with medical history significant of HTN, lumbar radiculopathy s/p fusion surgery of T12-L1 performed 09/28/21.  Since surgery she has had ongoing back pain, generalized weakness, and today had near syncope at home.  Pain worse when she gets up and ambulates.  No PT since discharge from rehab last week.  Lives alone normally, though brother at home with her today.  Took walker, went to bathroom, was coming back and legs buckled and she became very weak.  EMS called: initial SBPs in the 80s per report.  Has been taking her HTN meds.  Poor PO intake.  No urinary urgency nor frequency nor dysuria.  Says weakness is more generalized weakness than localized to legs.   Review of Systems: As mentioned in the history of present illness. All other systems reviewed and are negative. Past Medical History:  Diagnosis Date   Anxiety    Arthritis    Bartholin's gland abscess 04/2017   Left, incised and drained   High cholesterol    History of ESBL E. coli infection 2018   UTI   History of iron deficiency anemia    Hypertension    Past Surgical History:  Procedure Laterality Date   ANTERIOR LAT LUMBAR FUSION N/A 09/03/2019   Procedure: Lumbar one-two Anterolateral lumbar interbody fusion with lateral plate;  Surgeon: Kristeen Miss, MD;  Location: [place];  Service: Neurosurgery;  Laterality: N/A;   APPLICATION OF ROBOTIC ASSISTANCE FOR SPINAL PROCEDURE N/A 09/28/2021   Procedure: APPLICATION OF ROBOTIC ASSISTANCE FOR SPINAL PROCEDURE;  Surgeon: Kristeen Miss, MD;  Location: [place];  Service: Neurosurgery;  Laterality: N/A;   BACK SURGERY     BREAST EXCISIONAL BIOPSY Right    x 2    BUNIONECTOMY     CHOLECYSTECTOMY     COLONOSCOPY     DIAGNOSTIC LAPAROSCOPY     EYE SURGERY Bilateral    CATARACT SURGERY   HARDWARE REMOVAL Right 02/09/2021   Procedure: removal of right iliac crest screw with Met-rx;  Surgeon: Kristeen Miss, MD;  Location: [place];  Service: Neurosurgery;  Laterality: Right;   LUMBAR FUSION  2011   OPEN SURGICAL REPAIR OF GLUTEAL TENDON Right 01/14/2019   Procedure: Right hip bursectomy; gluteal tendon repair;  Surgeon: Gaynelle Arabian, MD;  Location: [facility];  Service: Orthopedics;  Laterality: Right;  34mn   right knee replacement   2011   TONSILLECTOMY AND ADENOIDECTOMY     TOTAL KNEE ARTHROPLASTY Left 09/19/2016   Procedure: LEFT TOTAL KNEE ARTHROPLASTY;  Surgeon: FGaynelle Arabian MD;  Location: [facility];  Service: Orthopedics;  Laterality: Left;  requests 549ms with abductor block   Social History:  reports that she has never smoked. She has never used smokeless tobacco. She reports that she does not drink alcohol and does not use drugs.  No Known Allergies  Family History  Problem Relation Age of Onset   Stroke Mother    Cancer Father        colon   Diabetes Father    Breast cancer Maternal Aunt    Breast cancer Maternal Grandmother     Prior to Admission medications   Medication Sig Start Date End Date Taking? Authorizing  Provider  amLODipine (NORVASC) 5 MG tablet Take 5 mg by mouth at bedtime.    [provider]  Cholecalciferol (VITAMIN D3) 50 MCG (2000 UT) capsule Take 2,000 Units by mouth daily.    [provider]  donepezil (ARICEPT) 5 MG tablet Take 1 tablet by mouth at bedtime.    [provider]  enalapril (VASOTEC) 20 MG tablet Take 20 mg by mouth 2 (two) times daily.    [provider]  estradiol (ESTRACE) 0.5 MG tablet Take one half tablet (0.25 mg) by mouth daily. Patient taking differently: Take 0.5 mg by mouth daily. 07/07/21   Nunzio Cobbs, MD  gabapentin (NEURONTIN) 300 MG capsule  Take 300 mg by mouth daily as needed (Nerve pain). 01/28/20   [provider]  HYDROcodone-acetaminophen (NORCO/VICODIN) 5-325 MG tablet Take 1 tablet by mouth daily as needed for moderate pain or severe pain. 04/20/21   [provider]  HYDROcodone-acetaminophen (NORCO/VICODIN) 5-325 MG tablet Take 1 tablet by mouth every 4 (four) hours as needed for moderate pain or severe pain. 10/06/21   Meyran, Ocie Cornfield, NP  meloxicam (MOBIC) 15 MG tablet Take 15 mg by mouth daily.    [provider]  methocarbamol (ROBAXIN) 500 MG tablet Take 1 tablet (500 mg total) by mouth every 6 (six) hours as needed for muscle spasms. 09/04/19   Kristeen Miss, MD  methocarbamol (ROBAXIN) 500 MG tablet Take 1 tablet (500 mg total) by mouth every 6 (six) hours as needed for muscle spasms. 10/06/21   Meyran, Ocie Cornfield, NP  pravastatin (PRAVACHOL) 20 MG tablet Take 20 mg by mouth at bedtime.    [provider]  progesterone (PROMETRIUM) 100 MG capsule Take 1 capsule (100 mg total) by mouth daily. Patient taking differently: Take 100 mg by mouth at bedtime. 07/07/21   Nunzio Cobbs, MD  sertraline (ZOLOFT) 50 MG tablet Take 50 mg by mouth daily.    [provider]    Physical Exam: Vitals:   10/29/21 1442 10/29/21 1545 10/29/21 1830 10/29/21 1930  BP: 130/72 (!) 157/87 (!) 153/67 (!) 150/77  Pulse: 71 85 (!) 110 (!) 107  Resp: 18 14 (!) 21 (!) 26  Temp:      TempSrc:      SpO2: 98% 100% 97% 90%   Constitutional: Ill appearing Eyes: PERRL, lids and conjunctivae normal ENMT: Mucous membranes are moist. Posterior pharynx clear of any exudate or lesions.Normal dentition.  Neck: normal, supple, no masses, no thyromegaly Respiratory: clear to auscultation bilaterally, no wheezing, no crackles. Normal respiratory effort. No accessory muscle use.  Cardiovascular: Regular rate and rhythm, no murmurs / rubs / gallops. No extremity edema. 2+ pedal pulses. No carotid  bruits.  Abdomen: no tenderness, no masses palpated. No hepatosplenomegaly. Bowel sounds positive.  Musculoskeletal: no clubbing / cyanosis. No joint deformity upper and lower extremities. Good ROM, no contractures. Normal muscle tone.  Skin: Mild erythema at base of surgical wound, no significant drainage.  Pale. Neurologic: 4/5 strength BLE Psychiatric: Normal judgment and insight. Alert and oriented x 3. Normal mood.   Data Reviewed:    CBC    Component Value Date/Time   WBC 7.1 10/29/2021 1244   RBC 3.06 (L) 10/29/2021 1244   HGB 9.1 (L) 10/29/2021 1244   HCT 29.4 (L) 10/29/2021 1244   PLT 211 10/29/2021 1244   MCV 96.1 10/29/2021 1244   MCH 29.7 10/29/2021 1244   MCHC 31.0 10/29/2021 1244  RDW 14.5 10/29/2021 1244   LYMPHSABS 0.6 (L) 10/29/2021 1244   MONOABS 0.9 10/29/2021 1244   EOSABS 0.0 10/29/2021 1244   BASOSABS 0.0 10/29/2021 1244   CMP     Component Value Date/Time   NA 138 10/29/2021 1244   K 4.9 10/29/2021 1244   CL 111 10/29/2021 1244   CO2 21 (L) 10/29/2021 1244   GLUCOSE 137 (H) 10/29/2021 1244   BUN 21 10/29/2021 1244   CREATININE 1.11 (H) 10/29/2021 1244   CALCIUM 9.2 10/29/2021 1244   PROT 6.6 10/29/2021 1244   ALBUMIN 3.4 (L) 10/29/2021 1244   AST 20 10/29/2021 1244   ALT 12 10/29/2021 1244   ALKPHOS 113 10/29/2021 1244   BILITOT 0.5 10/29/2021 1244   GFRNONAA 50 (L) 10/29/2021 1244   GFRAA >60 08/27/2019 1358   CXR = pulm vasc congestion  Urinalysis    Component Value Date/Time   COLORURINE YELLOW 10/29/2021 1818   APPEARANCEUR CLEAR 10/29/2021 1818   LABSPEC 1.011 10/29/2021 1818   PHURINE 5.0 10/29/2021 1818   GLUCOSEU NEGATIVE 10/29/2021 1818   HGBUR SMALL (A) 10/29/2021 1818   BILIRUBINUR NEGATIVE 10/29/2021 1818   KETONESUR 5 (A) 10/29/2021 1818   PROTEINUR NEGATIVE 10/29/2021 1818   UROBILINOGEN 0.2 08/22/2014 1139   NITRITE NEGATIVE 10/29/2021 1818   LEUKOCYTESUR NEGATIVE 10/29/2021 1818    CT AP, and MRI T and L spine  are pending.  Assessment and Plan: * Generalized weakness Following lumbar spinal surgery last month. DDx includes: 1) Symptomatic anemia: would explain presentation today including: generalized weakness, orthostatic hypotension, mild tachycardia, pulm vasc congestion on CXR despite no PMH of CHF (seems most likely the culprit, see anemia below) 2) infection: no WBC, could explain the anemia though.  Usually would expect post-op infection to onset a bit faster.  But imaging should help evaluate for this.  Reportedly had fever 100.4 in ED per EDP note. 3) PE: could explain fever, wouldn't explain anemia though, and she would have had an unusually good response to IVF bolus for a PE. 4) Spinal cord compression: wouldn't really explain the anemia, ortho stasis, tachycardia a month later though; also wouldn't explain the generalized weakness not localized to legs, also no report of neurogenic bladder like symptoms. (MRI should be able to rule this out). 5) new onset CHF: why ortho stasis if volume overloaded though?  Sepsis ([place]) Question of sepsis: Pt does have tachycardia. Apparently fever 100.4 in ED per EDP note. Got empiric cefepime, flagyl, vanc in ED Got 1L LR bolus and LR going at 125 for the moment BP improved to 160 systolic after fluid bolus (now 109 systolic) Lactate nl Checking procalcitonin BCx pending CXR neg UA neg CT AP pending MRI L and T spine pending  Anemia Looks like this onset a month ago after lumbar surgery. HGB 9.1 today compared to 9.6 on DC. Not clear why its persistent (and possibly worsening) though a month later?! Ongoing blood loss? Getting CT AP Getting MRI of T and L spine Getting hemoccult, but no frank melena nor hematochezia  Lumbar stenosis with neurogenic claudication Recent spinal fusion surgery at end of March MRI of T and L spine is pending  Essential hypertension Orthostatic hypotension today. 1) holding home BP meds for the  moment      Advance Care Planning:   Code Status: Full Code  Consults: None  Family Communication: Family at bedside  Severity of Illness: The appropriate patient status for this patient is OBSERVATION. Observation status  is judged to be reasonable and necessary in order to provide the required intensity of service to ensure the patient's safety. The patient's presenting symptoms, physical exam findings, and initial radiographic and laboratory data in the context of their medical condition is felt to place them at decreased risk for further clinical deterioration. Furthermore, it is anticipated that the patient will be medically stable for discharge from the hospital within 2 midnights of admission.   Author: Etta Quill., DO 10/29/2021 7:59 PM  For on call review [email or web address].

## 2021-10-29 NOTE — Progress Notes (Signed)
CT AP has come back showing findings of colitis of descending colon. Will de-escalate ABx to rocephin + flagyl EDP has ordered c.diff Will add on GI pathogen pnl

## 2021-10-30 DIAGNOSIS — R531 Weakness: Secondary | ICD-10-CM | POA: Diagnosis present

## 2021-10-30 DIAGNOSIS — E8809 Other disorders of plasma-protein metabolism, not elsewhere classified: Secondary | ICD-10-CM | POA: Diagnosis present

## 2021-10-30 DIAGNOSIS — Z833 Family history of diabetes mellitus: Secondary | ICD-10-CM | POA: Diagnosis not present

## 2021-10-30 DIAGNOSIS — Z823 Family history of stroke: Secondary | ICD-10-CM | POA: Diagnosis not present

## 2021-10-30 DIAGNOSIS — D509 Iron deficiency anemia, unspecified: Secondary | ICD-10-CM | POA: Diagnosis present

## 2021-10-30 DIAGNOSIS — I951 Orthostatic hypotension: Secondary | ICD-10-CM | POA: Diagnosis present

## 2021-10-30 DIAGNOSIS — Z803 Family history of malignant neoplasm of breast: Secondary | ICD-10-CM | POA: Diagnosis not present

## 2021-10-30 DIAGNOSIS — M8008XA Age-related osteoporosis with current pathological fracture, vertebra(e), initial encounter for fracture: Secondary | ICD-10-CM | POA: Diagnosis present

## 2021-10-30 DIAGNOSIS — D649 Anemia, unspecified: Secondary | ICD-10-CM | POA: Diagnosis not present

## 2021-10-30 DIAGNOSIS — D508 Other iron deficiency anemias: Secondary | ICD-10-CM | POA: Diagnosis not present

## 2021-10-30 DIAGNOSIS — E785 Hyperlipidemia, unspecified: Secondary | ICD-10-CM | POA: Diagnosis present

## 2021-10-30 DIAGNOSIS — M8088XA Other osteoporosis with current pathological fracture, vertebra(e), initial encounter for fracture: Secondary | ICD-10-CM | POA: Diagnosis not present

## 2021-10-30 DIAGNOSIS — Z791 Long term (current) use of non-steroidal anti-inflammatories (NSAID): Secondary | ICD-10-CM | POA: Diagnosis not present

## 2021-10-30 DIAGNOSIS — F0393 Unspecified dementia, unspecified severity, with mood disturbance: Secondary | ICD-10-CM | POA: Diagnosis present

## 2021-10-30 DIAGNOSIS — Z981 Arthrodesis status: Secondary | ICD-10-CM | POA: Diagnosis not present

## 2021-10-30 DIAGNOSIS — S22070A Wedge compression fracture of T9-T10 vertebra, initial encounter for closed fracture: Secondary | ICD-10-CM

## 2021-10-30 DIAGNOSIS — Z885 Allergy status to narcotic agent status: Secondary | ICD-10-CM | POA: Diagnosis not present

## 2021-10-30 DIAGNOSIS — E876 Hypokalemia: Secondary | ICD-10-CM | POA: Diagnosis not present

## 2021-10-30 DIAGNOSIS — F0394 Unspecified dementia, unspecified severity, with anxiety: Secondary | ICD-10-CM | POA: Diagnosis present

## 2021-10-30 DIAGNOSIS — K529 Noninfective gastroenteritis and colitis, unspecified: Principal | ICD-10-CM

## 2021-10-30 DIAGNOSIS — Z79899 Other long term (current) drug therapy: Secondary | ICD-10-CM | POA: Diagnosis not present

## 2021-10-30 DIAGNOSIS — N179 Acute kidney failure, unspecified: Secondary | ICD-10-CM | POA: Diagnosis present

## 2021-10-30 DIAGNOSIS — I10 Essential (primary) hypertension: Secondary | ICD-10-CM | POA: Diagnosis present

## 2021-10-30 DIAGNOSIS — A419 Sepsis, unspecified organism: Secondary | ICD-10-CM | POA: Diagnosis not present

## 2021-10-30 DIAGNOSIS — Z602 Problems related to living alone: Secondary | ICD-10-CM | POA: Diagnosis present

## 2021-10-30 DIAGNOSIS — M48062 Spinal stenosis, lumbar region with neurogenic claudication: Secondary | ICD-10-CM | POA: Diagnosis present

## 2021-10-30 DIAGNOSIS — Z800 Family history of malignant neoplasm of digestive organs: Secondary | ICD-10-CM | POA: Diagnosis not present

## 2021-10-30 DIAGNOSIS — Z96652 Presence of left artificial knee joint: Secondary | ICD-10-CM | POA: Diagnosis present

## 2021-10-30 LAB — BASIC METABOLIC PANEL
Anion gap: 5 (ref 5–15)
BUN: 16 mg/dL (ref 8–23)
CO2: 23 mmol/L (ref 22–32)
Calcium: 8.8 mg/dL — ABNORMAL LOW (ref 8.9–10.3)
Chloride: 109 mmol/L (ref 98–111)
Creatinine, Ser: 1.08 mg/dL — ABNORMAL HIGH (ref 0.44–1.00)
GFR, Estimated: 52 mL/min — ABNORMAL LOW (ref >60)
Glucose, Bld: 102 mg/dL — ABNORMAL HIGH (ref 70–99)
Potassium: 4.3 mmol/L (ref 3.5–5.1)
Sodium: 137 mmol/L (ref 135–145)

## 2021-10-30 LAB — CBC
HCT: 28.1 % — ABNORMAL LOW (ref 36.0–46.0)
Hemoglobin: 9 g/dL — ABNORMAL LOW (ref 12.0–15.0)
MCH: 30.2 pg (ref 26.0–34.0)
MCHC: 32 g/dL (ref 30.0–36.0)
MCV: 94.3 fL (ref 80.0–100.0)
Platelets: 192 10*3/uL (ref 150–400)
RBC: 2.98 MIL/uL — ABNORMAL LOW (ref 3.87–5.11)
RDW: 14.6 % (ref 11.5–15.5)
WBC: 15.2 10*3/uL — ABNORMAL HIGH (ref 4.0–10.5)
nRBC: 0 % (ref 0.0–0.2)

## 2021-10-30 LAB — PROCALCITONIN
Procalcitonin: <0.1 ng/mL
Procalcitonin: 0.58 ng/mL

## 2021-10-30 MED ORDER — METHOCARBAMOL 500 MG PO TABS
500.0000 mg | ORAL_TABLET | Freq: Four times a day (QID) | ORAL | Status: DC | PRN
  Administered 2021-10-30 – 2021-11-07 (×5): 500 mg via ORAL
  Filled 2021-10-30 (×6): qty 1

## 2021-10-30 MED ORDER — PROGESTERONE MICRONIZED 100 MG PO CAPS
100.0000 mg | ORAL_CAPSULE | Freq: Every day | ORAL | Status: DC
  Administered 2021-10-30 – 2021-11-07 (×9): 100 mg via ORAL
  Filled 2021-10-30 (×10): qty 1

## 2021-10-30 MED ORDER — PROMETHAZINE HCL 25 MG RE SUPP
25.0000 mg | Freq: Four times a day (QID) | RECTAL | Status: DC | PRN
Start: 2021-10-30 — End: 2021-11-08

## 2021-10-30 MED ORDER — SERTRALINE HCL 50 MG PO TABS
50.0000 mg | ORAL_TABLET | Freq: Every day | ORAL | Status: DC
Start: 2021-10-30 — End: 2021-11-08
  Administered 2021-10-30 – 2021-11-08 (×10): 50 mg via ORAL
  Filled 2021-10-30 (×11): qty 1

## 2021-10-30 MED ORDER — GABAPENTIN 300 MG PO CAPS
300.0000 mg | ORAL_CAPSULE | Freq: Every day | ORAL | Status: DC | PRN
  Administered 2021-10-30 – 2021-11-08 (×6): 300 mg via ORAL
  Filled 2021-10-30 (×6): qty 1

## 2021-10-30 MED ORDER — PROMETHAZINE HCL 25 MG PO TABS
25.0000 mg | ORAL_TABLET | Freq: Four times a day (QID) | ORAL | Status: DC | PRN
  Administered 2021-10-31: 25 mg via ORAL
  Filled 2021-10-30 (×2): qty 1

## 2021-10-30 MED ORDER — SODIUM CHLORIDE 0.9 % IV SOLN: INTRAVENOUS | Status: DC

## 2021-10-30 MED ORDER — HYDROCODONE-ACETAMINOPHEN 5-325 MG PO TABS
1.0000 | ORAL_TABLET | ORAL | Status: DC | PRN
  Administered 2021-10-30 – 2021-11-07 (×14): 1 via ORAL
  Filled 2021-10-30 (×14): qty 1

## 2021-10-30 MED ORDER — DONEPEZIL HCL 5 MG PO TABS
5.0000 mg | ORAL_TABLET | Freq: Every day | ORAL | Status: DC
Start: 2021-10-30 — End: 2021-11-08
  Administered 2021-10-30 – 2021-11-07 (×9): 5 mg via ORAL
  Filled 2021-10-30 (×9): qty 1

## 2021-10-30 MED ORDER — PRAVASTATIN SODIUM 10 MG PO TABS
20.0000 mg | ORAL_TABLET | Freq: Every day | ORAL | Status: DC
  Administered 2021-10-30 – 2021-11-07 (×9): 20 mg via ORAL
  Filled 2021-10-30: qty 2
  Filled 2021-10-30 (×4): qty 1
  Filled 2021-10-30: qty 2
  Filled 2021-10-30 (×2): qty 1
  Filled 2021-10-30 (×2): qty 2

## 2021-10-30 MED ORDER — MELOXICAM 7.5 MG PO TABS
15.0000 mg | ORAL_TABLET | Freq: Every day | ORAL | Status: DC
  Administered 2021-10-30 – 2021-11-08 (×9): 15 mg via ORAL
  Filled 2021-10-30: qty 1
  Filled 2021-10-30: qty 2
  Filled 2021-10-30 (×2): qty 1
  Filled 2021-10-30: qty 2
  Filled 2021-10-30 (×2): qty 1
  Filled 2021-10-30: qty 2
  Filled 2021-10-30 (×2): qty 1

## 2021-10-30 MED ORDER — PROCHLORPERAZINE EDISYLATE 10 MG/2ML IJ SOLN
10.0000 mg | Freq: Four times a day (QID) | INTRAMUSCULAR | Status: DC | PRN
Start: 2021-10-30 — End: 2021-11-08
  Administered 2021-10-30 – 2021-10-31 (×3): 10 mg via INTRAVENOUS
  Filled 2021-10-30 (×3): qty 2

## 2021-10-30 NOTE — Progress Notes (Signed)
PROGRESS NOTE    [name]  GUY:[id number] DOB: [birth year] DOA: 10/29/2021 PCP: Geoffry Paradise, MD   Brief Narrative:  Per admitting MD: [name] is a 81 y.o. female with medical history significant of HTN, lumbar radiculopathy s/p fusion surgery of T12-L1 performed 09/28/21.   Since surgery she has had ongoing back pain, generalized weakness, and today had near syncope at home. Pain worse when she gets up and ambulates. No PT since discharge from rehab last week.   Lives alone normally, though brother at home with her day of admission when she took walker, went to bathroom, was coming back and legs buckled and she became very weak.   EMS called: initial SBPs in the 80s per report. Has been taking her HTN meds. Poor PO intake. No urinary urgency nor frequency nor dysuria.   Says weakness is more generalized weakness than localized to legs.   Assessment & Plan:   Principal Problem:   Generalized weakness Active Problems:   Anemia   Sepsis (HCC)   Colitis   Lumbar stenosis with neurogenic claudication   Essential hypertension   Colitis WITH Sepsis and weakness Following lumbar spinal surgery last month. - will treat colitis -f/up stool studies -hydrate appropriately -c/w abx  T9 compression fracture acute -paged [facility] for opinion in setting of recent T12/L1 -awaiting call back   Sepsis (HCC) Question of sepsis: Pt does have tachycardia. Apparently fever 100.4 in ED per EDP note. Got empiric cefepime, flagyl, vanc in ED, now ctx and flagyl Got 1L LR bolus , will c/w gentle hydration NS 75 BP improved  Lactate nl 0.58 procalcitonin BCx NTD CXR neg UA neg CT AP reviewed MRI L and T spine reviewed   Anemia Looks like this onset a month ago after lumbar surgery. HGB 9.1 today compared to 9.6 on DC. Relatively stable   Lumbar stenosis with neurogenic claudication Recent spinal fusion surgery at end of March MRI of T and L spine t9  COMP FX AS ABOVE [facility]-AWAITING CALL BACK   Essential hypertension Orthostatic hypotension today. 1 holding home BP meds for the moment 2 Hydrate and titrate in as needed  DVT prophylaxis: SCD/Compression stockings  Code Status: full code    Code Status Orders  (From admission, onward)           Start     Ordered   10/29/21 2027  Full code  Continuous        10/29/21 2027           Code Status History     Date Active Date Inactive Code Status Order ID Comments User Context   09/28/2021 1512 10/07/2021 0117 Full Code [id number]  Barnett Abu, MD Inpatient   02/09/2021 1113 02/09/2021 1928 Full Code [id number]  Barnett Abu, MD Inpatient   09/03/2019 1721 09/04/2019 1822 Full Code [id number]  Barnett Abu, MD Inpatient   01/14/2019 1656 01/15/2019 1428 Full Code [id number]  Ollen Gross, MD Inpatient   06/29/2018 1425 07/05/2018 1559 Full Code [id number]  Barnett Abu, MD Inpatient   09/19/2016 1339 09/22/2016 1423 Full Code [id number]  Ollen Gross, MD Inpatient      Advance Directive Documentation    Flowsheet Row Most Recent Value  Type of Advance Directive Healthcare Power of Attorney, Living will  Pre-existing out of facility DNR order (yellow form or pink MOST form) --  "MOST" Form in Place? --      Family Communication: discussed with patient today  Disposition Plan:  pt will require inpt tx with iv abx and further NSU recs Consults called:  NSU Admission status: Inpatient   Consultants:  AS ABOVE  Procedures:  MR THORACIC SPINE WO CONTRAST  Result Date: 10/29/2021 CLINICAL DATA:  Concern for osteomyelitis EXAM: MRI THORACIC SPINE WITHOUT CONTRAST TECHNIQUE: Multiplanar, multisequence MR imaging of the thoracic spine was performed. No intravenous contrast was administered. COMPARISON:  MRI of the thoracic spine 08/01/2021 FINDINGS: Evaluation is somewhat limited by motion artifact. Alignment: S shaped curvature of the thoracolumbar spine. No  listhesis. Vertebrae: Focally increased T2 signal at the inferior aspect of T9, with up to 20% vertebral body height loss from the inferior endplate (series 18, image 7), which is new since 08/27/2021. Interval extension of previously noted lumbar spinal hardware to the T10 level; susceptibility artifact from the hardware limits evaluation of these levels. Within this limitation, increased T2 signal is noted about the endplates of T12-L1 (series 17, image 6), which appears increased compared to the prior exam, likely edema at the endplates associated with the interval placement of an interbody disc spacer at this level. Cord: Evaluation of the spinal cord in the lower thoracic spine is significantly limited by susceptibility artifact. Within this limitation, the spinal cord is normal in caliber and signal. Paraspinal and other soft tissues: No acute finding. Disc levels: Within the aforementioned limitations, no spinal canal stenosis or significant neural foraminal narrowing is seen, although the neural foramina at the postsurgical levels can not be well evaluated. IMPRESSION: 1. Acute appearing inferior endplate compression fracture at T9, with approximately 20% vertebral body height loss. 2. Interval extension of previously noted lumbar spinal hardware to the T10 level, with placement of an interbody disc spacer at T12-L1. Although evaluation of the levels is limited by susceptibility, there is increased T2 signal about the endplates at T12-L1, likely edema related to the previously noted degenerative changes in the disc spacer placement. Electronically Signed   By: Wiliam Ke M.D.   On: 10/29/2021 23:16   CT ABDOMEN PELVIS W CONTRAST  Result Date: 10/29/2021 CLINICAL DATA:  Abdominal pain, acute, nonlocalized EXAM: CT ABDOMEN AND PELVIS WITH CONTRAST TECHNIQUE: Multidetector CT imaging of the abdomen and pelvis was performed using the standard protocol following bolus administration of intravenous  contrast. RADIATION DOSE REDUCTION: This exam was performed according to the departmental dose-optimization program which includes automated exposure control, adjustment of the mA and/or kV according to patient size and/or use of iterative reconstruction technique. CONTRAST:  OMNIPAQUE IOHEXOL 300 MG/ML  SOLN COMPARISON:  None Available. FINDINGS: Lower chest: No acute abnormality Hepatobiliary: No focal liver abnormality is seen. Status post cholecystectomy. No biliary dilatation. Pancreas: No focal abnormality or ductal dilatation. Spleen: No focal abnormality.  Normal size. Adrenals/Urinary Tract: No adrenal abnormality. No focal renal abnormality. No stones or hydronephrosis. Urinary bladder is unremarkable. Stomach/Bowel: There is wall thickening surrounding the colon from the splenic flexure through the descending colon compatible with colitis. No bowel obstruction. Stomach and small bowel decompressed, unremarkable. Vascular/Lymphatic: Aortic atherosclerosis. No evidence of aneurysm or adenopathy. Reproductive: Uterus and adnexa unremarkable.  No mass. Other: No free fluid or free air. Musculoskeletal: No acute bony abnormality. Posterior fusion changes in the thoracolumbar spine. IMPRESSION: Wall thickening involving the descending colon compatible with colitis. Electronically Signed   By: Charlett Nose M.D.   On: 10/29/2021 20:17   DG Chest Port 1 View  Result Date: 10/29/2021 CLINICAL DATA:  Near syncope. EXAM: PORTABLE CHEST 1 VIEW COMPARISON:  Two-view chest x-ray  02/09/2010 FINDINGS: Heart size exaggerated by low lung volumes. Atherosclerotic changes are present at the aortic arch. Pulmonary vascular congestion is present without frank edema. No effusions are present. No focal airspace consolidation is present. IMPRESSION: 1. Low lung volumes. 2. Pulmonary vascular congestion without frank edema. Electronically Signed   By: Marin Robertshristopher  Mattern M.D.   On: 10/29/2021 13:01   DG Abd Portable  2V  Result Date: 10/01/2021 CLINICAL DATA:  Nausea and vomiting. EXAM: PORTABLE ABDOMEN - 2 VIEW COMPARISON:  February 12, 2021 FINDINGS: There is no evidence of bowel dilatation. There is no evidence of free air. A mild stool burden is seen. No radio-opaque calculi or other significant radiographic abnormality is seen. Radiopaque surgical clips are seen within the right upper quadrant. Extensive postoperative changes are seen throughout the thoracolumbar spine, with interval lower thoracic spine surgical intervention noted since the prior study. IMPRESSION: No evidence of bowel obstruction or free air. Electronically Signed   By: Aram Candelahaddeus  Houston M.D.   On: 10/01/2021 21:34   DG Foot Complete Left  Result Date: 10/04/2021 CLINICAL DATA:  Pain left foot EXAM: LEFT FOOT - COMPLETE 3+ VIEW COMPARISON:  None. FINDINGS: No recent fracture or dislocation is seen. Hallux valgus deformity is noted. Surgical screws seen in the first and second metatarsals and proximal phalanx of big toe. There is linear calcification in the Achilles tendon close to calcaneus suggesting calcific tendinosis. IMPRESSION: No recent fracture or dislocation is seen. Hallux valgus deformity is noted. Other findings as described in the body of the report. Electronically Signed   By: Ernie AvenaPalani  Rathinasamy M.D.   On: 10/04/2021 14:10      Subjective: Pt reported one episode of diarrhea this AM Still reports gen weakness, no focal finding  Objective: Vitals:   10/29/21 2300 10/30/21 0300 10/30/21 0607 10/30/21 1219  BP: 136/68 (!) 125/58 (!) 120/54 (!) 123/56  Pulse: (!) 101   85  Resp: 20 19 20 20   Temp: 99.1 F (37.3 C) 98.4 F (36.9 C) 98.2 F (36.8 C) 98.3 F (36.8 C)  TempSrc: Oral Oral Oral Oral  SpO2: 93% 100% 93% 92%    Intake/Output Summary (Last 24 hours) at 10/30/2021 1422 Last data filed at 10/30/2021 1227 Gross per 24 hour  Intake 2372.22 ml  Output 150 ml  Net 2222.22 ml   There were no vitals filed for  this visit.  Examination:  General exam: Appears calm and comfortable  Respiratory system: Clear to auscultation. Respiratory effort normal. Cardiovascular system: S1 & S2 heard, RRR. No JVD, murmurs, rubs, gallops or clicks. No pedal edema. Gastrointestinal system: TTP no focal findings, no r/g Central nervous system: Alert and oriented. No focal neurological deficits, non-focal Extremities: Symmetric 5 x 5 power. Skin: No rashes, lesions or ulcers Psychiatry: Judgement and insight appear normal. Mood & affect appropriate.     Data Reviewed: I have personally reviewed following labs and imaging studies  CBC: Recent Labs  Lab 10/29/21 1244 10/30/21 0331  WBC 7.1 15.2*  NEUTROABS 5.6  --   HGB 9.1* 9.0*  HCT 29.4* 28.1*  MCV 96.1 94.3  PLT 211 192   Basic Metabolic Panel: Recent Labs  Lab 10/29/21 1244 10/30/21 0331  NA 138 137  K 4.9 4.3  CL 111 109  CO2 21* 23  GLUCOSE 137* 102*  BUN 21 16  CREATININE 1.11* 1.08*  CALCIUM 9.2 8.8*   GFR: CrCl cannot be calculated (Unknown ideal weight.). Liver Function Tests: Recent Labs  Lab 10/29/21 1244  AST 20  ALT 12  ALKPHOS 113  BILITOT 0.5  PROT 6.6  ALBUMIN 3.4*   No results for input(s): LIPASE, AMYLASE in the last 168 hours. No results for input(s): AMMONIA in the last 168 hours. Coagulation Profile: No results for input(s): INR, PROTIME in the last 168 hours. Cardiac Enzymes: No results for input(s): CKTOTAL, CKMB, CKMBINDEX, TROPONINI in the last 168 hours. BNP (last 3 results) No results for input(s): PROBNP in the last 8760 hours. HbA1C: No results for input(s): HGBA1C in the last 72 hours. CBG: No results for input(s): GLUCAP in the last 168 hours. Lipid Profile: No results for input(s): CHOL, HDL, LDLCALC, TRIG, CHOLHDL, LDLDIRECT in the last 72 hours. Thyroid Function Tests: No results for input(s): TSH, T4TOTAL, FREET4, T3FREE, THYROIDAB in the last 72 hours. Anemia Panel: No results for  input(s): VITAMINB12, FOLATE, FERRITIN, TIBC, IRON, RETICCTPCT in the last 72 hours. Sepsis Labs: Recent Labs  Lab 10/29/21 1244 10/29/21 1405 10/29/21 1706 10/30/21 0331  PROCALCITON <0.10  --   --  0.58  LATICACIDVEN  --  1.2 1.6  --     Recent Results (from the past 240 hour(s))  Culture, blood (single)     Status: None (Preliminary result)   Collection Time: 10/29/21  5:06 PM   Specimen: BLOOD  Result Value Ref Range Status   Specimen Description   Final    BLOOD LEFT ANTECUBITAL Performed at [facility], [address], [place], [place] [address]    Special Requests   Final    BOTTLES DRAWN AEROBIC AND ANAEROBIC Blood Culture adequate volume Performed at [facility], [address], [place], [place] [address]    Culture   Final    NO GROWTH < 24 HOURS Performed at [facility], [address], [place], [place] [address]    Report Status PENDING  Incomplete         Radiology Studies: MR THORACIC SPINE WO CONTRAST  Result Date: 10/29/2021 CLINICAL DATA:  Concern for osteomyelitis EXAM: MRI THORACIC SPINE WITHOUT CONTRAST TECHNIQUE: Multiplanar, multisequence MR imaging of the thoracic spine was performed. No intravenous contrast was administered. COMPARISON:  MRI of the thoracic spine 08/01/2021 FINDINGS: Evaluation is somewhat limited by motion artifact. Alignment: S shaped curvature of the thoracolumbar spine. No listhesis. Vertebrae: Focally increased T2 signal at the inferior aspect of T9, with up to 20% vertebral body height loss from the inferior endplate (series 18, image 7), which is new since 08/27/2021. Interval extension of previously noted lumbar spinal hardware to the T10 level; susceptibility artifact from the hardware limits evaluation of these levels. Within this limitation, increased T2 signal is noted about the endplates of T12-L1 (series 17, image 6), which appears increased compared to the prior exam, likely  edema at the endplates associated with the interval placement of an interbody disc spacer at this level. Cord: Evaluation of the spinal cord in the lower thoracic spine is significantly limited by susceptibility artifact. Within this limitation, the spinal cord is normal in caliber and signal. Paraspinal and other soft tissues: No acute finding. Disc levels: Within the aforementioned limitations, no spinal canal stenosis or significant neural foraminal narrowing is seen, although the neural foramina at the postsurgical levels can not be well evaluated. IMPRESSION: 1. Acute appearing inferior endplate compression fracture at T9, with approximately 20% vertebral body height loss. 2. Interval extension of previously noted lumbar spinal hardware to the T10 level, with placement of an interbody disc spacer  at T12-L1. Although evaluation of the levels is limited by susceptibility, there is increased T2 signal about the endplates at T12-L1, likely edema related to the previously noted degenerative changes in the disc spacer placement. Electronically Signed   By: Wiliam Ke M.D.   On: 10/29/2021 23:16   CT ABDOMEN PELVIS W CONTRAST  Result Date: 10/29/2021 CLINICAL DATA:  Abdominal pain, acute, nonlocalized EXAM: CT ABDOMEN AND PELVIS WITH CONTRAST TECHNIQUE: Multidetector CT imaging of the abdomen and pelvis was performed using the standard protocol following bolus administration of intravenous contrast. RADIATION DOSE REDUCTION: This exam was performed according to the departmental dose-optimization program which includes automated exposure control, adjustment of the mA and/or kV according to patient size and/or use of iterative reconstruction technique. CONTRAST:  OMNIPAQUE IOHEXOL 300 MG/ML  SOLN COMPARISON:  None Available. FINDINGS: Lower chest: No acute abnormality Hepatobiliary: No focal liver abnormality is seen. Status post cholecystectomy. No biliary dilatation. Pancreas: No focal abnormality or  ductal dilatation. Spleen: No focal abnormality.  Normal size. Adrenals/Urinary Tract: No adrenal abnormality. No focal renal abnormality. No stones or hydronephrosis. Urinary bladder is unremarkable. Stomach/Bowel: There is wall thickening surrounding the colon from the splenic flexure through the descending colon compatible with colitis. No bowel obstruction. Stomach and small bowel decompressed, unremarkable. Vascular/Lymphatic: Aortic atherosclerosis. No evidence of aneurysm or adenopathy. Reproductive: Uterus and adnexa unremarkable.  No mass. Other: No free fluid or free air. Musculoskeletal: No acute bony abnormality. Posterior fusion changes in the thoracolumbar spine. IMPRESSION: Wall thickening involving the descending colon compatible with colitis. Electronically Signed   By: Charlett Nose M.D.   On: 10/29/2021 20:17   DG Chest Port 1 View  Result Date: 10/29/2021 CLINICAL DATA:  Near syncope. EXAM: PORTABLE CHEST 1 VIEW COMPARISON:  Two-view chest x-ray 02/09/2010 FINDINGS: Heart size exaggerated by low lung volumes. Atherosclerotic changes are present at the aortic arch. Pulmonary vascular congestion is present without frank edema. No effusions are present. No focal airspace consolidation is present. IMPRESSION: 1. Low lung volumes. 2. Pulmonary vascular congestion without frank edema. Electronically Signed   By: Marin Roberts M.D.   On: 10/29/2021 13:01        Scheduled Meds:  donepezil  5 mg Oral QHS   meloxicam  15 mg Oral Daily   pravastatin  20 mg Oral QHS   progesterone  100 mg Oral QHS   sertraline  50 mg Oral Daily   Continuous Infusions:  cefTRIAXone (ROCEPHIN)  IV 2 g (10/30/21 0526)   metronidazole Stopped (10/30/21 0828)     LOS: 0 days    Time spent: 35 min    Burke Keels, MD [facility]  If 7PM-7AM, please contact night-coverage  10/30/2021, 2:22 PM

## 2021-10-30 NOTE — Progress Notes (Signed)
Pt arrived to unit from MRI. Pt belongings intact. Pt transferred from stretcher to bed, VS obtained. Pt oriented to room/floor. Instructed on call light usage and fall prevention. Pt demonstrates understanding. Pt denies any further needs at this time.

## 2021-10-31 DIAGNOSIS — I10 Essential (primary) hypertension: Secondary | ICD-10-CM | POA: Diagnosis not present

## 2021-10-31 DIAGNOSIS — K529 Noninfective gastroenteritis and colitis, unspecified: Secondary | ICD-10-CM | POA: Diagnosis not present

## 2021-10-31 DIAGNOSIS — A419 Sepsis, unspecified organism: Secondary | ICD-10-CM | POA: Diagnosis not present

## 2021-10-31 DIAGNOSIS — D649 Anemia, unspecified: Secondary | ICD-10-CM | POA: Diagnosis not present

## 2021-10-31 LAB — CBC WITH DIFFERENTIAL/PLATELET
Abs Immature Granulocytes: 0.11 10*3/uL — ABNORMAL HIGH (ref 0.00–0.07)
Basophils Absolute: 0 10*3/uL (ref 0.0–0.1)
Basophils Relative: 0 %
Eosinophils Absolute: 0 10*3/uL (ref 0.0–0.5)
Eosinophils Relative: 0 %
HCT: 28.7 % — ABNORMAL LOW (ref 36.0–46.0)
Hemoglobin: 8.9 g/dL — ABNORMAL LOW (ref 12.0–15.0)
Immature Granulocytes: 1 %
Lymphocytes Relative: 3 %
Lymphs Abs: 0.5 10*3/uL — ABNORMAL LOW (ref 0.7–4.0)
MCH: 30.3 pg (ref 26.0–34.0)
MCHC: 31 g/dL (ref 30.0–36.0)
MCV: 97.6 fL (ref 80.0–100.0)
Monocytes Absolute: 1.5 10*3/uL — ABNORMAL HIGH (ref 0.1–1.0)
Monocytes Relative: 10 %
Neutro Abs: 12.6 10*3/uL — ABNORMAL HIGH (ref 1.7–7.7)
Neutrophils Relative %: 86 %
Platelets: 177 10*3/uL (ref 150–400)
RBC: 2.94 MIL/uL — ABNORMAL LOW (ref 3.87–5.11)
RDW: 14.6 % (ref 11.5–15.5)
WBC: 14.8 10*3/uL — ABNORMAL HIGH (ref 4.0–10.5)
nRBC: 0 % (ref 0.0–0.2)

## 2021-10-31 LAB — C DIFFICILE QUICK SCREEN W PCR REFLEX
C Diff antigen: NEGATIVE
C Diff interpretation: NOT DETECTED
C Diff toxin: NEGATIVE

## 2021-10-31 LAB — PROCALCITONIN: Procalcitonin: 0.37 ng/mL

## 2021-10-31 LAB — BASIC METABOLIC PANEL
Anion gap: 8 (ref 5–15)
BUN: 13 mg/dL (ref 8–23)
CO2: 20 mmol/L — ABNORMAL LOW (ref 22–32)
Calcium: 8.1 mg/dL — ABNORMAL LOW (ref 8.9–10.3)
Chloride: 107 mmol/L (ref 98–111)
Creatinine, Ser: 0.76 mg/dL (ref 0.44–1.00)
GFR, Estimated: >60 mL/min (ref >60)
Glucose, Bld: 91 mg/dL (ref 70–99)
Potassium: 3.4 mmol/L — ABNORMAL LOW (ref 3.5–5.1)
Sodium: 135 mmol/L (ref 135–145)

## 2021-10-31 MED ORDER — ENSURE ENLIVE PO LIQD: 237.0000 mL | Freq: Two times a day (BID) | ORAL | Status: DC

## 2021-10-31 MED ORDER — ADULT MULTIVITAMIN W/MINERALS CH
1.0000 | ORAL_TABLET | Freq: Every day | ORAL | Status: DC
  Administered 2021-10-31 – 2021-11-03 (×3): 1 via ORAL
  Filled 2021-10-31 (×6): qty 1

## 2021-10-31 MED ORDER — LIP MEDEX EX OINT
TOPICAL_OINTMENT | CUTANEOUS | Status: DC | PRN
  Administered 2021-10-31: 75 via TOPICAL
  Filled 2021-10-31: qty 14

## 2021-10-31 MED ORDER — BIOTENE DRY MOUTH MT LIQD: 15.0000 mL | OROMUCOSAL | Status: DC | PRN

## 2021-10-31 NOTE — Progress Notes (Signed)
PROGRESS NOTE    [name]  NGE:[id number] DOB: April 29, 1941 DOA: 10/29/2021 PCP: Geoffry Paradise, MD   Brief Narrative:  Per admitting MD: [name] is a 81 y.o. female with medical history significant of HTN, lumbar radiculopathy s/p fusion surgery of T12-L1 performed 09/28/21.   Since surgery she has had ongoing back pain, generalized weakness, and PTA had near syncope at home.  Lives alone normally, though brother at home with her day of admission when she took walker, went to bathroom, was coming back and legs buckled and she became very weak.  Pt reported back pain worse when she gets up and ambulates. .No PT since discharge from rehab last week.    EMS called: initial SBPs in the 80s per report. Has been taking her HTN meds. Poor PO intake. No urinary urgency nor frequency nor dysuria.  CT of abdomen pelvis showed colitis, stools finally collected this morning May 28, continue ceftriaxone and Flagyl. MRI of spine showed a new T9 compression fracture with 20% height loss, discussed with patient's on-call neurosurgical team-no surgical indications manage conservatively   Assessment & Plan:   Principal Problem:   Generalized weakness Active Problems:   Anemia   Sepsis (HCC)   Colitis   Lumbar stenosis with neurogenic claudication   Essential hypertension   Colitis WITH Sepsis and weakness Following lumbar spinal surgery last month. - will treat colitis -f/up stool studies-just collected this AM 5/28 -hydrate appropriately -3 DIARRHEAL EPISODES YESTERDAY , ONE THIS AM -c/w abx   T9 compression fracture acute -discussed with [place] [facility] for opinion in setting of recent T12/L1 -no surgical recommendations, will havem her NSU f/up in opffice   Anemia Looks like this onset a month ago after lumbar surgery. HGB 8.9 today compared to 9.6 on DC. Relatively stable   Lumbar stenosis with neurogenic claudication Recent spinal fusion surgery at end of  March MRI of T and L spine t9 COMP FX AS ABOVE [facility]-AS ABOVE   Essential hypertension Orthostatic hypotension today. 1 holding home BP meds for the moment 2 Hydrate and titrate in as needed   DVT prophylaxis: SCD/Compression stockings  Code Status: full code  DVT prophylaxis: SCD/Compression stockings  Code Status: FULL CODE    Code Status Orders  (From admission, onward)           Start     Ordered   10/29/21 2027  Full code  Continuous        10/29/21 2027           Code Status History     Date Active Date Inactive Code Status Order ID Comments User Context   09/28/2021 1512 10/07/2021 0117 Full Code [id number]  Barnett Abu, MD Inpatient   02/09/2021 1113 02/09/2021 1928 Full Code [id number]  Barnett Abu, MD Inpatient   09/03/2019 1721 09/04/2019 1822 Full Code [id number]  Barnett Abu, MD Inpatient   01/14/2019 1656 01/15/2019 1428 Full Code [id number]  Ollen Gross, MD Inpatient   06/29/2018 1425 07/05/2018 1559 Full Code [id number]  Barnett Abu, MD Inpatient   09/19/2016 1339 09/22/2016 1423 Full Code [id number]  Ollen Gross, MD Inpatient      Advance Directive Documentation    Flowsheet Row Most Recent Value  Type of Advance Directive Healthcare Power of Attorney, Living will  Pre-existing out of facility DNR order (yellow form or pink MOST form) --  "MOST" Form in Place? --      Family Communication: DISCUSSED WITH PATIENT AND FRIEND TODAY  Disposition Plan:    Patient not medically stable for discharge, requiring IV antiemetics, IV pain meds. Consults called: None Admission status: Inpatient   Consultants:  None  Procedures:  MR THORACIC SPINE WO CONTRAST  Result Date: 10/29/2021 CLINICAL DATA:  Concern for osteomyelitis EXAM: MRI THORACIC SPINE WITHOUT CONTRAST TECHNIQUE: Multiplanar, multisequence MR imaging of the thoracic spine was performed. No intravenous contrast was administered. COMPARISON:  MRI of the thoracic spine 08/01/2021  FINDINGS: Evaluation is somewhat limited by motion artifact. Alignment: S shaped curvature of the thoracolumbar spine. No listhesis. Vertebrae: Focally increased T2 signal at the inferior aspect of T9, with up to 20% vertebral body height loss from the inferior endplate (series 18, image 7), which is new since 08/27/2021. Interval extension of previously noted lumbar spinal hardware to the T10 level; susceptibility artifact from the hardware limits evaluation of these levels. Within this limitation, increased T2 signal is noted about the endplates of T12-L1 (series 17, image 6), which appears increased compared to the prior exam, likely edema at the endplates associated with the interval placement of an interbody disc spacer at this level. Cord: Evaluation of the spinal cord in the lower thoracic spine is significantly limited by susceptibility artifact. Within this limitation, the spinal cord is normal in caliber and signal. Paraspinal and other soft tissues: No acute finding. Disc levels: Within the aforementioned limitations, no spinal canal stenosis or significant neural foraminal narrowing is seen, although the neural foramina at the postsurgical levels can not be well evaluated. IMPRESSION: 1. Acute appearing inferior endplate compression fracture at T9, with approximately 20% vertebral body height loss. 2. Interval extension of previously noted lumbar spinal hardware to the T10 level, with placement of an interbody disc spacer at T12-L1. Although evaluation of the levels is limited by susceptibility, there is increased T2 signal about the endplates at T12-L1, likely edema related to the previously noted degenerative changes in the disc spacer placement. Electronically Signed   By: Wiliam Ke M.D.   On: 10/29/2021 23:16   CT ABDOMEN PELVIS W CONTRAST  Result Date: 10/29/2021 CLINICAL DATA:  Abdominal pain, acute, nonlocalized EXAM: CT ABDOMEN AND PELVIS WITH CONTRAST TECHNIQUE: Multidetector CT imaging  of the abdomen and pelvis was performed using the standard protocol following bolus administration of intravenous contrast. RADIATION DOSE REDUCTION: This exam was performed according to the departmental dose-optimization program which includes automated exposure control, adjustment of the mA and/or kV according to patient size and/or use of iterative reconstruction technique. CONTRAST:  OMNIPAQUE IOHEXOL 300 MG/ML  SOLN COMPARISON:  None Available. FINDINGS: Lower chest: No acute abnormality Hepatobiliary: No focal liver abnormality is seen. Status post cholecystectomy. No biliary dilatation. Pancreas: No focal abnormality or ductal dilatation. Spleen: No focal abnormality.  Normal size. Adrenals/Urinary Tract: No adrenal abnormality. No focal renal abnormality. No stones or hydronephrosis. Urinary bladder is unremarkable. Stomach/Bowel: There is wall thickening surrounding the colon from the splenic flexure through the descending colon compatible with colitis. No bowel obstruction. Stomach and small bowel decompressed, unremarkable. Vascular/Lymphatic: Aortic atherosclerosis. No evidence of aneurysm or adenopathy. Reproductive: Uterus and adnexa unremarkable.  No mass. Other: No free fluid or free air. Musculoskeletal: No acute bony abnormality. Posterior fusion changes in the thoracolumbar spine. IMPRESSION: Wall thickening involving the descending colon compatible with colitis. Electronically Signed   By: Charlett Nose M.D.   On: 10/29/2021 20:17   DG Chest Port 1 View  Result Date: 10/29/2021 CLINICAL DATA:  Near syncope. EXAM: PORTABLE CHEST 1 VIEW COMPARISON:  Two-view chest x-ray 02/09/2010 FINDINGS: Heart size exaggerated by low lung volumes. Atherosclerotic changes are present at the aortic arch. Pulmonary vascular congestion is present without frank edema. No effusions are present. No focal airspace consolidation is present. IMPRESSION: 1. Low lung volumes. 2. Pulmonary vascular congestion  without frank edema. Electronically Signed   By: Marin Roberts M.D.   On: 10/29/2021 13:01   DG Abd Portable 2V  Result Date: 10/01/2021 CLINICAL DATA:  Nausea and vomiting. EXAM: PORTABLE ABDOMEN - 2 VIEW COMPARISON:  February 12, 2021 FINDINGS: There is no evidence of bowel dilatation. There is no evidence of free air. A mild stool burden is seen. No radio-opaque calculi or other significant radiographic abnormality is seen. Radiopaque surgical clips are seen within the right upper quadrant. Extensive postoperative changes are seen throughout the thoracolumbar spine, with interval lower thoracic spine surgical intervention noted since the prior study. IMPRESSION: No evidence of bowel obstruction or free air. Electronically Signed   By: Aram Candela M.D.   On: 10/01/2021 21:34   DG Foot Complete Left  Result Date: 10/04/2021 CLINICAL DATA:  Pain left foot EXAM: LEFT FOOT - COMPLETE 3+ VIEW COMPARISON:  None. FINDINGS: No recent fracture or dislocation is seen. Hallux valgus deformity is noted. Surgical screws seen in the first and second metatarsals and proximal phalanx of big toe. There is linear calcification in the Achilles tendon close to calcaneus suggesting calcific tendinosis. IMPRESSION: No recent fracture or dislocation is seen. Hallux valgus deformity is noted. Other findings as described in the body of the report. Electronically Signed   By: Ernie Avena M.D.   On: 10/04/2021 14:10    Antimicrobials:  Ceftriaxone and Flagyl   Subjective: Patient reported some nausea and vomiting as well as diarrhea, 3 episodes yesterday and one this morning  Objective: Vitals:   10/30/21 2123 10/31/21 0253 10/31/21 0455 10/31/21 0929  BP: (!) 107/50 (!) 154/64 (!) 128/55 139/61  Pulse: 81  87 97  Resp: Temp: 98.2 F (36.8 C)  98 F (36.7 C)   TempSrc: Oral  Oral   SpO2: 95% 93% 97% 90%    Intake/Output Summary (Last 24 hours) at 10/31/2021 1130 Last data filed  at 10/31/2021 9562 Gross per 24 hour  Intake 2432.57 ml  Output 400 ml  Net 2032.57 ml   There were no vitals filed for this visit.  Examination:  General exam: Appears calm and comfortable  Respiratory system: Clear to auscultation. Respiratory effort normal. Cardiovascular system: S1 & S2 heard, RRR. No JVD, murmurs, rubs, gallops or clicks. No pedal edema. Gastrointestinal system: Mildly tender to palpation, no rebound no guarding not rigid active bowel sounds Central nervous system: Alert and oriented. No focal neurological deficits. Extremities: Symmetric 5 x 5 power. Skin: No rashes, lesions or ulcers Psychiatry: Judgement and insight appear normal. Mood & affect appropriate.     Data Reviewed: I have personally reviewed following labs and imaging studies  CBC: Recent Labs  Lab 10/29/21 1244 10/30/21 0331 10/31/21 0339  WBC 7.1 15.2* 14.8*  NEUTROABS 5.6  --  12.6*  HGB 9.1* 9.0* 8.9*  HCT 29.4* 28.1* 28.7*  MCV 96.1 94.3 97.6  PLT 211 192 177   Basic Metabolic Panel: Recent Labs  Lab 10/29/21 1244 10/30/21 0331 10/31/21 0339  NA 138 137 135  K 4.9 4.3 3.4*  CL 111 109 107  CO2 21* 23 20*  GLUCOSE 137* 102* 91  BUN CREATININE  1.11* 1.08* 0.76  CALCIUM 9.2 8.8* 8.1*   GFR: CrCl cannot be calculated (Unknown ideal weight.). Liver Function Tests: Recent Labs  Lab 10/29/21 1244  AST 20  ALT 12  ALKPHOS 113  BILITOT 0.5  PROT 6.6  ALBUMIN 3.4*   No results for input(s): LIPASE, AMYLASE in the last 168 hours. No results for input(s): AMMONIA in the last 168 hours. Coagulation Profile: No results for input(s): INR, PROTIME in the last 168 hours. Cardiac Enzymes: No results for input(s): CKTOTAL, CKMB, CKMBINDEX, TROPONINI in the last 168 hours. BNP (last 3 results) No results for input(s): PROBNP in the last 8760 hours. HbA1C: No results for input(s): HGBA1C in the last 72 hours. CBG: No results for input(s): GLUCAP in the last 168  hours. Lipid Profile: No results for input(s): CHOL, HDL, LDLCALC, TRIG, CHOLHDL, LDLDIRECT in the last 72 hours. Thyroid Function Tests: No results for input(s): TSH, T4TOTAL, FREET4, T3FREE, THYROIDAB in the last 72 hours. Anemia Panel: No results for input(s): VITAMINB12, FOLATE, FERRITIN, TIBC, IRON, RETICCTPCT in the last 72 hours. Sepsis Labs: Recent Labs  Lab 10/29/21 1244 10/29/21 1405 10/29/21 1706 10/30/21 0331 10/31/21 0339  PROCALCITON <0.10  --   --  0.58 0.37  LATICACIDVEN  --  1.2 1.6  --   --     Recent Results (from the past 240 hour(s))  Culture, blood (single)     Status: None (Preliminary result)   Collection Time: 10/29/21  5:06 PM   Specimen: BLOOD  Result Value Ref Range Status   Specimen Description   Final    BLOOD LEFT ANTECUBITAL Performed at [facility], [address] [place], [place] [address]    Special Requests   Final    BOTTLES DRAWN AEROBIC AND ANAEROBIC Blood Culture adequate volume Performed at [facility], [address] [place], [place] [address]    Culture   Final    NO GROWTH < 24 HOURS Performed at [facility], [address] [place], [place] [address]    Report Status PENDING  Incomplete  C Difficile Quick Screen w PCR reflex     Status: None   Collection Time: 10/31/21  9:35 AM   Specimen: Stool  Result Value Ref Range Status   C Diff antigen NEGATIVE NEGATIVE Final   C Diff toxin NEGATIVE NEGATIVE Final   C Diff interpretation No C. difficile detected.  Final    Comment: Performed at [facility], [address] [place], [place] [address]         Radiology Studies: MR THORACIC SPINE WO CONTRAST  Result Date: 10/29/2021 CLINICAL DATA:  Concern for osteomyelitis EXAM: MRI THORACIC SPINE WITHOUT CONTRAST TECHNIQUE: Multiplanar, multisequence MR imaging of the thoracic spine was performed. No intravenous contrast was administered. COMPARISON:  MRI of the  thoracic spine 08/01/2021 FINDINGS: Evaluation is somewhat limited by motion artifact. Alignment: S shaped curvature of the thoracolumbar spine. No listhesis. Vertebrae: Focally increased T2 signal at the inferior aspect of T9, with up to 20% vertebral body height loss from the inferior endplate (series 18, image 7), which is new since 08/27/2021. Interval extension of previously noted lumbar spinal hardware to the T10 level; susceptibility artifact from the hardware limits evaluation of these levels. Within this limitation, increased T2 signal is noted about the endplates of T12-L1 (series 17, image 6), which appears increased compared to the prior exam, likely edema at the endplates associated with the interval placement of an  interbody disc spacer at this level. Cord: Evaluation of the spinal cord in the lower thoracic spine is significantly limited by susceptibility artifact. Within this limitation, the spinal cord is normal in caliber and signal. Paraspinal and other soft tissues: No acute finding. Disc levels: Within the aforementioned limitations, no spinal canal stenosis or significant neural foraminal narrowing is seen, although the neural foramina at the postsurgical levels can not be well evaluated. IMPRESSION: 1. Acute appearing inferior endplate compression fracture at T9, with approximately 20% vertebral body height loss. 2. Interval extension of previously noted lumbar spinal hardware to the T10 level, with placement of an interbody disc spacer at T12-L1. Although evaluation of the levels is limited by susceptibility, there is increased T2 signal about the endplates at T12-L1, likely edema related to the previously noted degenerative changes in the disc spacer placement. Electronically Signed   By: Wiliam Ke M.D.   On: 10/29/2021 23:16   CT ABDOMEN PELVIS W CONTRAST  Result Date: 10/29/2021 CLINICAL DATA:  Abdominal pain, acute, nonlocalized EXAM: CT ABDOMEN AND PELVIS WITH CONTRAST TECHNIQUE:  Multidetector CT imaging of the abdomen and pelvis was performed using the standard protocol following bolus administration of intravenous contrast. RADIATION DOSE REDUCTION: This exam was performed according to the departmental dose-optimization program which includes automated exposure control, adjustment of the mA and/or kV according to patient size and/or use of iterative reconstruction technique. CONTRAST:  OMNIPAQUE IOHEXOL 300 MG/ML  SOLN COMPARISON:  None Available. FINDINGS: Lower chest: No acute abnormality Hepatobiliary: No focal liver abnormality is seen. Status post cholecystectomy. No biliary dilatation. Pancreas: No focal abnormality or ductal dilatation. Spleen: No focal abnormality.  Normal size. Adrenals/Urinary Tract: No adrenal abnormality. No focal renal abnormality. No stones or hydronephrosis. Urinary bladder is unremarkable. Stomach/Bowel: There is wall thickening surrounding the colon from the splenic flexure through the descending colon compatible with colitis. No bowel obstruction. Stomach and small bowel decompressed, unremarkable. Vascular/Lymphatic: Aortic atherosclerosis. No evidence of aneurysm or adenopathy. Reproductive: Uterus and adnexa unremarkable.  No mass. Other: No free fluid or free air. Musculoskeletal: No acute bony abnormality. Posterior fusion changes in the thoracolumbar spine. IMPRESSION: Wall thickening involving the descending colon compatible with colitis. Electronically Signed   By: Charlett Nose M.D.   On: 10/29/2021 20:17   DG Chest Port 1 View  Result Date: 10/29/2021 CLINICAL DATA:  Near syncope. EXAM: PORTABLE CHEST 1 VIEW COMPARISON:  Two-view chest x-ray 02/09/2010 FINDINGS: Heart size exaggerated by low lung volumes. Atherosclerotic changes are present at the aortic arch. Pulmonary vascular congestion is present without frank edema. No effusions are present. No focal airspace consolidation is present. IMPRESSION: 1. Low lung volumes. 2. Pulmonary  vascular congestion without frank edema. Electronically Signed   By: Marin Roberts M.D.   On: 10/29/2021 13:01        Scheduled Meds:  donepezil  5 mg Oral QHS   meloxicam  15 mg Oral Daily   pravastatin  20 mg Oral QHS   progesterone  100 mg Oral QHS   sertraline  50 mg Oral Daily   Continuous Infusions:  sodium chloride 75 mL/hr at 10/31/21 0507   cefTRIAXone (ROCEPHIN)  IV 2 g (10/31/21 0507)   metronidazole 500 mg (10/31/21 0926)     LOS: 1 day    Time spent: 35 MIN    Burke Keels, MD [facility]  If 7PM-7AM, please contact night-coverage  10/31/2021, 11:30 AM

## 2021-10-31 NOTE — Progress Notes (Signed)
The patient is refusing PO's. She reports experiencing intermittent nausea. Discussed use of antiemetic. The pt declines at this time. Will continue to monitor.

## 2021-10-31 NOTE — Progress Notes (Addendum)
Initial Nutrition Assessment  DOCUMENTATION CODES:   Not applicable  INTERVENTION:  - Liberalize diet from a heart healthy to a regular diet to provide widest variety of menu options to enhance nutritional adequacy  - Ensure Enlive po BID, each supplement provides 350 kcal and 20 grams of protein. (Chocolate )  - MVI with minerals daily  - Request updated weight  NUTRITION DIAGNOSIS:   Increased nutrient needs related to acute illness as evidenced by estimated needs.  GOAL:   Patient will meet greater than or equal to 90% of their needs  MONITOR:   PO intake, Supplement acceptance, Diet advancement, Labs, Weight trends  REASON FOR ASSESSMENT:   Malnutrition Screening Tool    ASSESSMENT:   Pt admitted with generalized weakness. PMH significant for HTN, lumbar radiculopathy s/p fusion surgery of T12-L1 performed 4/25.   S/p CT AP- findings of colitis of descending colon  Pt noted to have n/v yesterday.   Spoke with pt via phone call to room. She endorses minimal PO intake and has not tolerated much food within the past 3-4 days. She states that she has not eaten anything today. Her nausea has improved as long as she does not drink water. Discussed importance of adequate nutrition. She is agreeable to trying Ensure for additional nutrition supplementation.   Meal completions: 05/27: 0%-breakfast  Pt reports a usual wt of 181 lbs and endorses a 15 lbs wt loss within the last week. Unable to confirm this wt loss as there is limited documentation of recent wt. Between 02/09/21- 09/27/21, pt's wt appears to have increased from 81.2 kg to 84.1 kg. Will request updated wt and reassess at follow up.  Medications: IV NaCl @ 62ml/hr, rocephin and flagyl  Labs: potassium 3.4, calcium 8.1  NUTRITION - FOCUSED PHYSICAL EXAM: RD working remotely. Deferred to follow up.   Diet Order:   Diet Order             Diet regular Room service appropriate? Yes; Fluid consistency: Thin   Diet effective now                   EDUCATION NEEDS:   Education needs have been addressed  Skin:  Skin Assessment: Skin Integrity Issues: Skin Integrity Issues:: Incisions Incisions: back (closed)  Last BM:  5/27 (type 7)  Height:   Ht Readings from Last 1 Encounters:  09/28/21 5\' 7"  (1.702 m)    Weight:   Wt Readings from Last 1 Encounters:  09/28/21 83.9 kg    Ideal Body Weight:  61.4 kg  BMI:  There is no height or weight on file to calculate BMI.  Estimated Nutritional Needs:   Kcal:  1600-1800  Protein:  80-95g  Fluid:  >/=1.6L  Clayborne Dana, RDN, LDN Clinical Nutrition

## 2021-11-01 DIAGNOSIS — R531 Weakness: Secondary | ICD-10-CM | POA: Diagnosis not present

## 2021-11-01 LAB — CBC WITH DIFFERENTIAL/PLATELET
Abs Immature Granulocytes: 0.15 10*3/uL — ABNORMAL HIGH (ref 0.00–0.07)
Basophils Absolute: 0 10*3/uL (ref 0.0–0.1)
Basophils Relative: 0 %
Eosinophils Absolute: 0 10*3/uL (ref 0.0–0.5)
Eosinophils Relative: 0 %
HCT: 26.8 % — ABNORMAL LOW (ref 36.0–46.0)
Hemoglobin: 8.8 g/dL — ABNORMAL LOW (ref 12.0–15.0)
Immature Granulocytes: 1 %
Lymphocytes Relative: 7 %
Lymphs Abs: 1.1 10*3/uL (ref 0.7–4.0)
MCH: 30.9 pg (ref 26.0–34.0)
MCHC: 32.8 g/dL (ref 30.0–36.0)
MCV: 94 fL (ref 80.0–100.0)
Monocytes Absolute: 1.1 10*3/uL — ABNORMAL HIGH (ref 0.1–1.0)
Monocytes Relative: 7 %
Neutro Abs: 13.3 10*3/uL — ABNORMAL HIGH (ref 1.7–7.7)
Neutrophils Relative %: 85 %
Platelets: 214 10*3/uL (ref 150–400)
RBC: 2.85 MIL/uL — ABNORMAL LOW (ref 3.87–5.11)
RDW: 14.4 % (ref 11.5–15.5)
WBC: 15.8 10*3/uL — ABNORMAL HIGH (ref 4.0–10.5)
nRBC: 0 % (ref 0.0–0.2)

## 2021-11-01 LAB — GASTROINTESTINAL PANEL BY PCR, STOOL (REPLACES STOOL CULTURE)

## 2021-11-01 LAB — BASIC METABOLIC PANEL
Anion gap: 6 (ref 5–15)
BUN: 9 mg/dL (ref 8–23)
CO2: 23 mmol/L (ref 22–32)
Calcium: 8.3 mg/dL — ABNORMAL LOW (ref 8.9–10.3)
Chloride: 112 mmol/L — ABNORMAL HIGH (ref 98–111)
Creatinine, Ser: 0.59 mg/dL (ref 0.44–1.00)
GFR, Estimated: >60 mL/min (ref >60)
Glucose, Bld: 84 mg/dL (ref 70–99)
Potassium: 3.1 mmol/L — ABNORMAL LOW (ref 3.5–5.1)
Sodium: 141 mmol/L (ref 135–145)

## 2021-11-01 MED ORDER — AMLODIPINE BESYLATE 5 MG PO TABS
5.0000 mg | ORAL_TABLET | Freq: Every day | ORAL | Status: DC
  Administered 2021-11-01 – 2021-11-07 (×7): 5 mg via ORAL
  Filled 2021-11-01 (×8): qty 1

## 2021-11-01 MED ORDER — POTASSIUM CHLORIDE CRYS ER 20 MEQ PO TBCR
40.0000 meq | EXTENDED_RELEASE_TABLET | Freq: Once | ORAL | Status: AC
  Administered 2021-11-01: 40 meq via ORAL
  Filled 2021-11-01: qty 2

## 2021-11-01 MED ORDER — ENALAPRIL MALEATE 10 MG PO TABS
20.0000 mg | ORAL_TABLET | Freq: Two times a day (BID) | ORAL | Status: DC
  Administered 2021-11-01 – 2021-11-04 (×6): 20 mg via ORAL
  Filled 2021-11-01 (×7): qty 2

## 2021-11-01 NOTE — Progress Notes (Signed)
PROGRESS NOTE  [name] [id number] DOB: [birth year] DOA: 10/29/2021 PCP: Burnard Bunting, MD  HPI/Recap of past 24 hours: [name] is a 81 y.o. female with medical history significant of HTN, lumbar radiculopathy s/p fusion surgery of T12-L1 performed 09/28/21 who presents to [facility] ED with complaints of generalized weakness.  Since surgery she has had ongoing back pain, generalized weakness and prior to admission had near syncope at home.  MRI of spine showed a new T9 compression fracture with 20% height loss, Dr. Wyonia Hough, discussed the case with patient's on-call neurosurgical team-no surgical indications, recommended to manage conservatively.  Patient also had complaints of abdominal pain for which a CT scan was obtained showing colitis.  C. difficile PCR and GI panel by PCR negative.  The patient received Rocephin 2 g daily and IV Flagyl twice daily, initiated on 10/30/2021.  11/01/21: The patient was seen and examined at bedside reports back pain 8 out of 10.  Worse with movement.  Assessment/Plan: Principal Problem:   Generalized weakness Active Problems:   Anemia   Sepsis ([place])   Colitis   Lumbar stenosis with neurogenic claudication   Essential hypertension  Colitis WITH Sepsis and weakness Following lumbar spinal surgery last month. - will treat colitis, IV Rocephin 2 g daily and IV Flagyl 500 mg twice daily initiated on 10/30/2021. C. difficile PCR and GI panel by PCR negative. WBC uptrending 15,000 from 14,000 Monitor fever curve and WBC.   T9 compression fracture acute -Dr. Wyonia Hough discussed with [name] neurosurgy for opinion in setting of recent T12/L1 -no surgical recommendations, will have him follow-up in the outpatient setting.  Presyncope Obtain orthostatic vital signs Fall precautions PT OT to assess   Chronic normocytic anemia Looks like this onset a month ago after lumbar surgery. HGB 8.8 from 8.9, compared to 9.6 on DC. Monitor H&H.  No  overt bleeding noted.   Lumbar stenosis with neurogenic claudication Recent spinal fusion surgery at end of March MRI of T and L spine T9 COMP FX AS ABOVE [place] NEUROSURGERY-AS ABOVE Analgesics as needed.  Essential hypertension BP is not at goal, elevated.   Restart home oral antihypertensives.     DVT prophylaxis: SCD/Compression stockings        Code Status: Full code  Family Communication: None at bedside  Disposition Plan: Likely will discharge to home with the assistance of home health service.   Procedures: None.  Antimicrobials: Rocephin IV Flagyl  DVT prophylaxis: SCDs  Status is: Inpatient The patient requires at least 2 midnights for further evaluation and treatment of present condition.    Objective: Vitals:   10/31/21 1314 10/31/21 2007 11/01/21 0632 11/01/21 1330  BP: 140/70 (!) 150/68 (!) 160/71 (!) 172/79  Pulse: 83 78 73 79  Resp: 16   16  Temp: 98.6 F (37 C) 98.5 F (36.9 C) 98.2 F (36.8 C) 97.9 F (36.6 C)  TempSrc: Oral Oral Oral Oral  SpO2: 97% 92% 91% 96%    Intake/Output Summary (Last 24 hours) at 11/01/2021 1632 Last data filed at 11/01/2021 1300 Gross per 24 hour  Intake 1469.33 ml  Output 700 ml  Net 769.33 ml   There were no vitals filed for this visit.  Exam:  General: 81 y.o. year-old female well developed well nourished in no acute distress.  Alert and oriented x3. Cardiovascular: Regular rate and rhythm with no rubs or gallops.  No thyromegaly or JVD noted.   Respiratory: Clear to auscultation with no wheezes or rales.  Poor inspiratory effort. Abdomen: Soft nontender nondistended with normal bowel sounds x4 quadrants. Musculoskeletal: No lower extremity edema bilaterally. Skin: No ulcerative lesions noted or rashes, Psychiatry: Mood is appropriate for condition and setting   Data Reviewed: CBC: Recent Labs  Lab 10/29/21 1244 10/30/21 0331 10/31/21 0339 11/01/21 0323  WBC 7.1 15.2* 14.8* 15.8*   NEUTROABS 5.6  --  12.6* 13.3*  HGB 9.1* 9.0* 8.9* 8.8*  HCT 29.4* 28.1* 28.7* 26.8*  MCV 96.1 94.3 97.6 94.0  PLT 211 192 177 [id number]   Basic Metabolic Panel: Recent Labs  Lab 10/29/21 1244 10/30/21 0331 10/31/21 0339 11/01/21 0323  NA 138 137 135 141  K 4.9 4.3 3.4* 3.1*  CL 111 109 107 112*  CO2 21* 23 20* 23  GLUCOSE 137* 102* 91 84  BUN 21 16 13 9   CREATININE 1.11* 1.08* 0.76 0.59  CALCIUM 9.2 8.8* 8.1* 8.3*   GFR: CrCl cannot be calculated (Unknown ideal weight.). Liver Function Tests: Recent Labs  Lab 10/29/21 1244  AST 20  ALT 12  ALKPHOS 113  BILITOT 0.5  PROT 6.6  ALBUMIN 3.4*   No results for input(s): LIPASE, AMYLASE in the last 168 hours. No results for input(s): AMMONIA in the last 168 hours. Coagulation Profile: No results for input(s): INR, PROTIME in the last 168 hours. Cardiac Enzymes: No results for input(s): CKTOTAL, CKMB, CKMBINDEX, TROPONINI in the last 168 hours. BNP (last 3 results) No results for input(s): PROBNP in the last 8760 hours. HbA1C: No results for input(s): HGBA1C in the last 72 hours. CBG: No results for input(s): GLUCAP in the last 168 hours. Lipid Profile: No results for input(s): CHOL, HDL, LDLCALC, TRIG, CHOLHDL, LDLDIRECT in the last 72 hours. Thyroid Function Tests: No results for input(s): TSH, T4TOTAL, FREET4, T3FREE, THYROIDAB in the last 72 hours. Anemia Panel: No results for input(s): VITAMINB12, FOLATE, FERRITIN, TIBC, IRON, RETICCTPCT in the last 72 hours. Urine analysis:    Component Value Date/Time   COLORURINE YELLOW 10/29/2021 1818   APPEARANCEUR CLEAR 10/29/2021 1818   LABSPEC 1.011 10/29/2021 1818   PHURINE 5.0 10/29/2021 1818   GLUCOSEU NEGATIVE 10/29/2021 1818   HGBUR SMALL (A) 10/29/2021 1818   BILIRUBINUR NEGATIVE 10/29/2021 1818   KETONESUR 5 (A) 10/29/2021 1818   PROTEINUR NEGATIVE 10/29/2021 1818   UROBILINOGEN 0.2 08/22/2014 1139   NITRITE NEGATIVE 10/29/2021 1818   LEUKOCYTESUR NEGATIVE  10/29/2021 1818   Sepsis Labs: @LABRCNTIP (procalcitonin:4,lacticidven:4)  ) Recent Results (from the past 240 hour(s))  Culture, blood (single)     Status: None (Preliminary result)   Collection Time: 10/29/21  5:06 PM   Specimen: BLOOD  Result Value Ref Range Status   Specimen Description   Final    BLOOD LEFT ANTECUBITAL Performed at [facility], [place] [address], [place], [place] [address]    Special Requests   Final    BOTTLES DRAWN AEROBIC AND ANAEROBIC Blood Culture adequate volume Performed at [facility] [address], [place], [place] [address]    Culture   Final    NO GROWTH 3 DAYS Performed at [facility], [place] [address] [place], [place] [address]    Report Status PENDING  Incomplete  C Difficile Quick Screen w PCR reflex     Status: None   Collection Time: 10/31/21  9:35 AM   Specimen: Stool  Result Value Ref Range Status   C Diff antigen NEGATIVE NEGATIVE Final   C Diff toxin NEGATIVE NEGATIVE Final  C Diff interpretation No C. difficile detected.  Final    Comment: Performed at [facility], [place] [employer] [address] [place], [place] [address]  Gastrointestinal Panel by PCR , Stool     Status: None   Collection Time: 10/31/21  9:35 AM   Specimen: Stool  Result Value Ref Range Status   Campylobacter species NOT DETECTED NOT DETECTED Final   Plesimonas shigelloides NOT DETECTED NOT DETECTED Final   Salmonella species NOT DETECTED NOT DETECTED Final   Yersinia enterocolitica NOT DETECTED NOT DETECTED Final   Vibrio species NOT DETECTED NOT DETECTED Final   Vibrio cholerae NOT DETECTED NOT DETECTED Final   Enteroaggregative E coli (EAEC) NOT DETECTED NOT DETECTED Final   Enteropathogenic E coli (EPEC) NOT DETECTED NOT DETECTED Final   Enterotoxigenic E coli (ETEC) NOT DETECTED NOT DETECTED Final   Shiga like toxin producing E coli (STEC) NOT DETECTED NOT DETECTED Final   Shigella/Enteroinvasive E coli  (EIEC) NOT DETECTED NOT DETECTED Final   Cryptosporidium NOT DETECTED NOT DETECTED Final   Cyclospora cayetanensis NOT DETECTED NOT DETECTED Final   Entamoeba histolytica NOT DETECTED NOT DETECTED Final   Giardia lamblia NOT DETECTED NOT DETECTED Final   Adenovirus F40/41 NOT DETECTED NOT DETECTED Final   Astrovirus NOT DETECTED NOT DETECTED Final   Norovirus GI/GII NOT DETECTED NOT DETECTED Final   Rotavirus A NOT DETECTED NOT DETECTED Final   Sapovirus (I, II, IV, and V) NOT DETECTED NOT DETECTED Final    Comment: Performed at [facility], [address], [place], [place] [address]      Studies: No results found.  Scheduled Meds:  amLODipine  5 mg Oral QHS   donepezil  5 mg Oral QHS   enalapril  20 mg Oral BID   feeding supplement  237 mL Oral BID BM   meloxicam  15 mg Oral Daily   multivitamin with minerals  1 tablet Oral Daily   pravastatin  20 mg Oral QHS   progesterone  100 mg Oral QHS   sertraline  50 mg Oral Daily    Continuous Infusions:  sodium chloride 75 mL/hr at 10/31/21 1510   cefTRIAXone (ROCEPHIN)  IV 2 g (11/01/21 0428)   metronidazole 500 mg (11/01/21 0659)     LOS: 2 days     Kayleen Memos, MD [facility] Pager [phone]  If 7PM-7AM, please contact night-coverage [email or web address] Password [facility] 11/01/2021, 4:32 PM

## 2021-11-01 NOTE — TOC Initial Note (Signed)
Transition of Care [facility]) - Initial/Assessment Note    Patient Details  Name: [name] MRN: [id number] Date of Birth: Mar 20, 1941  Transition of Care [facility]) CM/SW Contact:    Leeroy Cha, RN Phone Number: 11/01/2021, 7:45 AM  Clinical Narrative:                  Transition of Care [facility]) Screening Note   Patient Details  Name: [name] Date of Birth: 03/12/1941   Transition of Care [facility]) CM/SW Contact:    Leeroy Cha, RN Phone Number: 11/01/2021, 7:45 AM    Transition of Care Department [facility]) has reviewed patient and no TOC needs have been identified at this time. We will continue to monitor patient advancement through interdisciplinary progression rounds. If new patient transition needs arise, please place a TOC consult.  May need to return to [facility] for rehab will follow  Expected Discharge Plan: Home/Self Care Barriers to Discharge: Continued Medical Work up   Patient Goals and CMS Choice Patient states their goals for this hospitalization and ongoing recovery are:: to go home CMS Medicare.gov Compare Post Acute Care list provided to:: Patient    Expected Discharge Plan and Services Expected Discharge Plan: Home/Self Care   Discharge Planning Services: CM Consult   Living arrangements for the past 2 months:  (dcd x1 Publishing rights manager before that was at [facility] for rehab)                                      Prior Living Arrangements/Services Living arrangements for the past 2 months: [place] (dcd x1 Publishing rights manager before that was at [facility] for rehab) Lives with:: Self Patient language and need for interpreter reviewed:: Yes Do you feel safe going back to the place where you live?: Yes            Criminal Activity/Legal Involvement Pertinent to Current Situation/Hospitalization: No - Comment as needed  Activities of Daily Living Home Assistive Devices/Equipment: Environmental consultant (specify type),  Cane (specify quad or straight), Wheelchair ADL Screening (condition at time of admission) Patient's cognitive ability adequate to safely complete daily activities?: Yes Is the patient deaf or have difficulty hearing?: No Does the patient have difficulty seeing, even when wearing glasses/contacts?: No Does the patient have difficulty concentrating, remembering, or making decisions?: No Patient able to express need for assistance with ADLs?: Yes Does the patient have difficulty dressing or bathing?: Yes Independently performs ADLs?: No Communication: Independent Dressing (OT): Needs assistance Is this a change from baseline?: Change from baseline, expected to last >3 days Grooming: Independent Feeding: Independent Bathing: Needs assistance Is this a change from baseline?: Change from baseline, expected to last >3 days Toileting: Needs assistance Is this a change from baseline?: Change from baseline, expected to last >3days In/Out Bed: Needs assistance Is this a change from baseline?: Change from baseline, expected to last >3 days Walks in Home: Needs assistance Is this a change from baseline?: Change from baseline, expected to last >3 days Does the patient have difficulty walking or climbing stairs?: Yes Weakness of Legs: Both Weakness of Arms/Hands: Both  Permission Sought/Granted                  Emotional Assessment Appearance:: Appears stated age Attitude/Demeanor/Rapport: Engaged Affect (typically observed): Calm Orientation: : Oriented to Place, Oriented to Self, Oriented to  Time, Oriented to Situation Alcohol /  Substance Use: Not Applicable, Never Used Psych Involvement: No (comment)  Admission diagnosis:  Colitis [K52.9] Generalized weakness [R53.1] Anemia, unspecified type [D64.9] Sepsis, due to unspecified organism, unspecified whether acute organ dysfunction present [facility]) [A41.9] Patient Active Problem List   Diagnosis Date Noted   Anemia 10/29/2021    Generalized weakness 10/29/2021   Sepsis ([place]) 10/29/2021    Class: Question of   Colitis 10/29/2021   Other spondylosis with radiculopathy, lumbar region 02/09/2021   Lumbar stenosis with neurogenic claudication 09/03/2019   Trochanteric bursitis, right hip 01/14/2019   Lumbar radiculopathy, chronic 06/29/2018   OA (osteoarthritis) of knee 09/19/2016   Palpitations 12/09/2015   Right carotid bruit 12/09/2015   Syncope 12/08/2015   Degenerative disc disease, lumbar 12/08/2015   Essential hypertension 12/08/2015   High cholesterol    Arthritis    PCP:  Burnard Bunting, MD Pharmacy:   [id number] - [place], [place]. AT [address] [place] [address] Phone: [phone] Fax: [phone]     Social Determinants of Health (SDOH) Interventions    Readmission Risk Interventions     View : No data to display.

## 2021-11-02 DIAGNOSIS — R531 Weakness: Secondary | ICD-10-CM | POA: Diagnosis not present

## 2021-11-02 LAB — BASIC METABOLIC PANEL
Anion gap: 9 (ref 5–15)
BUN: 6 mg/dL — ABNORMAL LOW (ref 8–23)
CO2: 23 mmol/L (ref 22–32)
Calcium: 8.5 mg/dL — ABNORMAL LOW (ref 8.9–10.3)
Chloride: 109 mmol/L (ref 98–111)
Creatinine, Ser: 0.56 mg/dL (ref 0.44–1.00)
GFR, Estimated: >60 mL/min (ref >60)
Glucose, Bld: 81 mg/dL (ref 70–99)
Potassium: 3 mmol/L — ABNORMAL LOW (ref 3.5–5.1)
Sodium: 141 mmol/L (ref 135–145)

## 2021-11-02 LAB — CBC WITH DIFFERENTIAL/PLATELET
Abs Immature Granulocytes: 0.16 10*3/uL — ABNORMAL HIGH (ref 0.00–0.07)
Basophils Absolute: 0.1 10*3/uL (ref 0.0–0.1)
Basophils Relative: 0 %
Eosinophils Absolute: 0.1 10*3/uL (ref 0.0–0.5)
Eosinophils Relative: 1 %
HCT: 30.5 % — ABNORMAL LOW (ref 36.0–46.0)
Hemoglobin: 9.8 g/dL — ABNORMAL LOW (ref 12.0–15.0)
Immature Granulocytes: 1 %
Lymphocytes Relative: 11 %
Lymphs Abs: 1.2 10*3/uL (ref 0.7–4.0)
MCH: 30.2 pg (ref 26.0–34.0)
MCHC: 32.1 g/dL (ref 30.0–36.0)
MCV: 93.8 fL (ref 80.0–100.0)
Monocytes Absolute: 1.1 10*3/uL — ABNORMAL HIGH (ref 0.1–1.0)
Monocytes Relative: 9 %
Neutro Abs: 8.8 10*3/uL — ABNORMAL HIGH (ref 1.7–7.7)
Neutrophils Relative %: 78 %
Platelets: 231 10*3/uL (ref 150–400)
RBC: 3.25 MIL/uL — ABNORMAL LOW (ref 3.87–5.11)
RDW: 14.2 % (ref 11.5–15.5)
WBC: 11.3 10*3/uL — ABNORMAL HIGH (ref 4.0–10.5)
nRBC: 0 % (ref 0.0–0.2)

## 2021-11-02 LAB — MAGNESIUM: Magnesium: 1.7 mg/dL (ref 1.7–2.4)

## 2021-11-02 MED ORDER — LOPERAMIDE HCL 2 MG PO CAPS
4.0000 mg | ORAL_CAPSULE | ORAL | Status: DC | PRN
  Administered 2021-11-02 – 2021-11-07 (×4): 4 mg via ORAL
  Filled 2021-11-02 (×4): qty 2

## 2021-11-02 MED ORDER — POTASSIUM CHLORIDE CRYS ER 20 MEQ PO TBCR
40.0000 meq | EXTENDED_RELEASE_TABLET | ORAL | Status: AC
  Administered 2021-11-02 (×2): 40 meq via ORAL
  Filled 2021-11-02 (×2): qty 2

## 2021-11-02 NOTE — Evaluation (Signed)
Physical Therapy Evaluation Patient Details Name: [name] MRN: [id number] DOB: [birth year] Today's Date: 11/02/2021  History of Present Illness  Pt is an 81 y.o. female with recent herniated nucleus pulposus with severe stenosis T12-L1, s/p T12-L1 PLIF, T10-L3 posterior arthrodesis pedicle fixation on 09/28/21. PMH includes prior back sxs (most recently 08/2019), bilateral TKA (2011, 2018), HTN, arthritis, anxiety.  Pt presented to [facility] ED with complaints of generalized weakness.  Since surgery she has had ongoing back pain, generalized weakness and prior to admission had near syncope at home.  MRI of spine showed a new T9 compression fracture with 20% height loss, Dr. Lurene Shadow, discussed the case with patient's on-call neurosurgical team-no surgical indications, recommended to manage conservatively.  Patient also had complaints of abdominal pain for which a CT scan was obtained showing colitis  Clinical Impression  Pt admitted with above diagnosis. Pt currently with functional limitations due to the deficits listed below (see PT Problem List). Pt will benefit from skilled PT to increase their independence and safety with mobility to allow discharge to the venue listed below.  Pt reports being modified independent prior to this admission and had recently had lumbar spine surgery, rehab and discharged back home.  Pt lives alone and very vague about assist available upon d/c.  Pt plans to return home and feels she will be able to perform at modified independent level upon d/c home "eventually" however based on current mobility and occasional min assist would recommend assist available at home for safe discharge.  If this is not available then pt may need SNF.      Recommendations for follow up therapy are one component of a multi-disciplinary discharge planning process, led by the attending physician.  Recommendations may be updated based on patient status, additional functional criteria and insurance  authorization.  Follow Up Recommendations Home health PT    Assistance Recommended at Discharge Intermittent Supervision/Assistance  Patient can return home with the following  A little help with walking and/or transfers;A little help with bathing/dressing/bathroom;Assistance with cooking/housework    Equipment Recommendations None recommended by PT  Recommendations for Other Services       Functional Status Assessment Patient has had a recent decline in their functional status and demonstrates the ability to make significant improvements in function in a reasonable and predictable amount of time.     Precautions / Restrictions Precautions Precautions: Fall;Back Required Braces or Orthoses: Spinal Brace Spinal Brace: Lumbar corset;Applied in sitting position      Mobility  Bed Mobility Overal bed mobility: Needs Assistance Bed Mobility: Rolling, Sidelying to Sit, Sit to Sidelying Rolling: Supervision Sidelying to sit: Supervision     Sit to sidelying: Min assist General bed mobility comments: cues for log rolling, pt with difficulty bringing LEs onto bed so provided light assist, pt utilized bed rails however reports no rails at home    Transfers Overall transfer level: Needs assistance Equipment used: Rolling walker (2 wheels) Transfers: Sit to/from Stand Sit to Stand: Min assist           General transfer comment: pt able to don/doff lumbar brace at EOB; verbal cues for precautions, assist to rise from regular chair with armrests    Ambulation/Gait Ambulation/Gait assistance: Min guard Gait Distance (Feet): 20 Feet Assistive device: Rolling walker (2 wheels) Gait Pattern/deviations: Step-through pattern, Decreased stride length Gait velocity: decr     General Gait Details: pt with slow but steady gait with RW, limited distance due to fatigue; pt denied any dizziness,  required seated rest break 10 ft x2  Stairs            Wheelchair Mobility     Modified Rankin (Stroke Patients Only)       Balance                                             Pertinent Vitals/Pain Pain Assessment Pain Assessment: Faces Faces Pain Scale: Hurts a little bit Pain Location: back with transitional movements Pain Descriptors / Indicators: Grimacing, Guarding Pain Intervention(s): Repositioned, Monitored during session    Home Living Family/patient expects to be discharged to:: Private residence Living Arrangements: Alone Available Help at Discharge: Friend(s);Available PRN/intermittently Type of Home: House Home Access: Level entry       Home Layout: One level Home Equipment: Agricultural consultant (2 wheels);Cane - single point;Shower seat;Adaptive equipment Additional Comments: pt reports "I can call someone for help if needed..." not forthcoming with further details regarding assist available at d/c    Prior Function Prior Level of Function : Independent/Modified Independent             Mobility Comments: pt reports she was ambulatory with RW upon return home after lumbar surgeryz ADLs Comments: pt reports she was able to complete ADLs, uses depends     Hand Dominance        Extremity/Trunk Assessment        Lower Extremity Assessment Lower Extremity Assessment: Generalized weakness       Communication   Communication: No difficulties  Cognition Arousal/Alertness: Awake/alert Behavior During Therapy: WFL for tasks assessed/performed Overall Cognitive Status: Within Functional Limits for tasks assessed                                          General Comments      Exercises     Assessment/Plan    PT Assessment Patient needs continued PT services  PT Problem List Decreased strength;Decreased activity tolerance;Decreased mobility;Decreased knowledge of precautions;Decreased knowledge of use of DME       PT Treatment Interventions DME instruction;Gait training;Balance  training;Therapeutic exercise;Functional mobility training;Therapeutic activities;Patient/family education    PT Goals (Current goals can be found in the Care Plan section)  Acute Rehab PT Goals PT Goal Formulation: With patient Time For Goal Achievement: 11/16/21 Potential to Achieve Goals: Good    Frequency Min 3X/week     Co-evaluation               AM-PAC PT "6 Clicks" Mobility  Outcome Measure Help needed turning from your back to your side while in a flat bed without using bedrails?: A Little Help needed moving from lying on your back to sitting on the side of a flat bed without using bedrails?: A Little Help needed moving to and from a bed to a chair (including a wheelchair)?: A Little Help needed standing up from a chair using your arms (e.g., wheelchair or bedside chair)?: A Little Help needed to walk in hospital room?: A Lot Help needed climbing 3-5 steps with a railing? : A Lot 6 Click Score: 16    End of Session Equipment Utilized During Treatment: Gait belt;Back brace Activity Tolerance: Patient limited by fatigue Patient left: in bed;with call bell/phone within reach   PT Visit Diagnosis: Difficulty in  walking, not elsewhere classified (R26.2)    Time: [id number] PT Time Calculation (min) (ACUTE ONLY): 35 min   Charges:   PT Evaluation $PT Eval Low Complexity: 1 Low PT Treatments $Gait Training: 8-22 mins       Thomasene MohairKati PT, DPT Acute Rehabilitation Services Pager: [phone] Office: [phone]   Janan HalterKati L Payson 11/02/2021, 1:38 PM

## 2021-11-02 NOTE — Progress Notes (Signed)
PROGRESS NOTE  [name] ZOX:[id number] DOB: 1940-10-30 DOA: 10/29/2021 PCP: Geoffry Paradise, MD  HPI/Recap of past 24 hours: [name] is a 81 y.o. female with medical history significant of HTN, lumbar radiculopathy s/p fusion surgery of T12-L1 performed 09/28/21 who presents to [facility] ED with complaints of generalized weakness.  Since surgery she has had ongoing back pain, generalized weakness and prior to admission had near syncope at home.  MRI of spine showed a new T9 compression fracture with 20% height loss, Dr. Lurene Shadow, discussed the case with patient's on-call neurosurgical team-no surgical indications, recommended to manage conservatively.  Patient also had complaints of abdominal pain for which a CT scan was obtained showing colitis.  C. difficile PCR and GI panel by PCR negative.  The patient received Rocephin 2 g daily and IV Flagyl twice daily, initiated on 10/30/2021.  11/01/21: The patient was seen and examined at bedside reports back pain 8 out of 10.  Worse with movement.  5/30: Patient continued to have some diarrhea, also experiencing intermittent nausea, no vomiting.  Patient refused orthostatic vitals this morning as she did not had her back brace.  PT is recommending home health services.  Assessment/Plan: Principal Problem:   Generalized weakness Active Problems:   Anemia   Sepsis (HCC)   Colitis   Lumbar stenosis with neurogenic claudication   Essential hypertension  Colitis WITH Sepsis and weakness Following lumbar spinal surgery last month. - will treat colitis, IV Rocephin 2 g daily and IV Flagyl 500 mg twice daily initiated on 10/30/2021-to complete a 5-day course. C. difficile PCR and GI panel by PCR negative. WBC uptrending 15,000 from 14,000 Monitor fever curve and WBC.   T9 compression fracture acute -Dr. Lurene Shadow discussed with [name] neurosurgy for opinion in setting of recent T12/L1 -no surgical recommendations, will have him follow-up in  the outpatient setting.  Presyncope Obtain orthostatic vital signs Fall precautions PT OT to assess   Chronic normocytic anemia Looks like this onset a month ago after lumbar surgery. HGB 8.8 from 8.9, compared to 9.6 on DC. Monitor H&H.  No overt bleeding noted.   Lumbar stenosis with neurogenic claudication Recent spinal fusion surgery at end of March MRI of T and L spine T9 COMP FX AS ABOVE [facility] NEUR[facility]ERY-AS ABOVE Analgesics as needed.  Essential hypertension BP is not at goal, elevated.   Restart home oral antihypertensives.     DVT prophylaxis: SCD/Compression stockings     Code Status: Full code  Family Communication: None at bedside  Disposition Plan: Likely will discharge to home with the assistance of home health service.   Procedures: None.  Antimicrobials: Rocephin IV Flagyl  DVT prophylaxis: SCDs  Status is: Inpatient The patient requires at least 2 midnights for further evaluation and treatment of present condition.    Objective: Vitals:   11/01/21 2110 11/02/21 0546 11/02/21 0857 11/02/21 1324  BP: (!) 163/75 138/66 (!) 149/71 (!) 145/62  Pulse: 80 77  74  Resp: 18 19  18   Temp: 98.6 F (37 C) 98.1 F (36.7 C)  97.7 F (36.5 C)  TempSrc: Oral Oral  Oral  SpO2: 94% 96%  94%    Intake/Output Summary (Last 24 hours) at 11/02/2021 1503 Last data filed at 11/02/2021 1400 Gross per 24 hour  Intake 2487.83 ml  Output 1400 ml  Net 1087.83 ml    There were no vitals filed for this visit.  Exam:  General.     In no acute distress. Pulmonary.  Lungs clear bilaterally, normal respiratory effort. CV.  Regular rate and rhythm, no JVD, rub or murmur. Abdomen.  Soft, mild diffuse tenderness, nondistended, BS positive. CNS.  Alert and oriented .  No focal neurologic deficit. Extremities.  No edema, no cyanosis, pulses intact and symmetrical. Psychiatry.  Judgment and insight appears normal.   Data Reviewed: CBC: Recent Labs  Lab  10/29/21 1244 10/30/21 0331 10/31/21 0339 11/01/21 0323 11/02/21 0329  WBC 7.1 15.2* 14.8* 15.8* 11.3*  NEUTROABS 5.6  --  12.6* 13.3* 8.8*  HGB 9.1* 9.0* 8.9* 8.8* 9.8*  HCT 29.4* 28.1* 28.7* 26.8* 30.5*  MCV 96.1 94.3 97.6 94.0 93.8  PLT 211 192 177 214 231    Basic Metabolic Panel: Recent Labs  Lab 10/29/21 1244 10/30/21 0331 10/31/21 0339 11/01/21 0323 11/02/21 0329  NA 138 137 135 141 141  K 4.9 4.3 3.4* 3.1* 3.0*  CL 111 109 107 112* 109  CO2 21* 23 20* 23 23  GLUCOSE 137* 102* 91 84 81  BUN 21 16 13 9  6*  CREATININE 1.11* 1.08* 0.76 0.59 0.56  CALCIUM 9.2 8.8* 8.1* 8.3* 8.5*  MG  --   --   --   --  1.7    GFR: CrCl cannot be calculated (Unknown ideal weight.). Liver Function Tests: Recent Labs  Lab 10/29/21 1244  AST 20  ALT 12  ALKPHOS 113  BILITOT 0.5  PROT 6.6  ALBUMIN 3.4*    No results for input(s): LIPASE, AMYLASE in the last 168 hours. No results for input(s): AMMONIA in the last 168 hours. Coagulation Profile: No results for input(s): INR, PROTIME in the last 168 hours. Cardiac Enzymes: No results for input(s): CKTOTAL, CKMB, CKMBINDEX, TROPONINI in the last 168 hours. BNP (last 3 results) No results for input(s): PROBNP in the last 8760 hours. HbA1C: No results for input(s): HGBA1C in the last 72 hours. CBG: No results for input(s): GLUCAP in the last 168 hours. Lipid Profile: No results for input(s): CHOL, HDL, LDLCALC, TRIG, CHOLHDL, LDLDIRECT in the last 72 hours. Thyroid Function Tests: No results for input(s): TSH, T4TOTAL, FREET4, T3FREE, THYROIDAB in the last 72 hours. Anemia Panel: No results for input(s): VITAMINB12, FOLATE, FERRITIN, TIBC, IRON, RETICCTPCT in the last 72 hours. Urine analysis:    Component Value Date/Time   COLORURINE YELLOW 10/29/2021 1818   APPEARANCEUR CLEAR 10/29/2021 1818   LABSPEC 1.011 10/29/2021 1818   PHURINE 5.0 10/29/2021 1818   GLUCOSEU NEGATIVE 10/29/2021 1818   HGBUR SMALL (A) 10/29/2021  1818   BILIRUBINUR NEGATIVE 10/29/2021 1818   KETONESUR 5 (A) 10/29/2021 1818   PROTEINUR NEGATIVE 10/29/2021 1818   UROBILINOGEN 0.2 08/22/2014 1139   NITRITE NEGATIVE 10/29/2021 1818   LEUKOCYTESUR NEGATIVE 10/29/2021 1818   Sepsis Labs: @LABRCNTIP (procalcitonin:4,lacticidven:4)  ) Recent Results (from the past 240 hour(s))  Culture, blood (single)     Status: None (Preliminary result)   Collection Time: 10/29/21  5:06 PM   Specimen: BLOOD  Result Value Ref Range Status   Specimen Description   Final    BLOOD LEFT ANTECUBITAL Performed at [facility], [address] [place], [place] [address]    Special Requests   Final    BOTTLES DRAWN AEROBIC AND ANAEROBIC Blood Culture adequate volume Performed at [facility], [address] [place], [place] [address]    Culture   Final    NO GROWTH 4 DAYS Performed at [facility], [address] [place], [place] [address]  Report Status PENDING  Incomplete  C Difficile Quick Screen w PCR reflex     Status: None   Collection Time: 10/31/21  9:35 AM   Specimen: Stool  Result Value Ref Range Status   C Diff antigen NEGATIVE NEGATIVE Final   C Diff toxin NEGATIVE NEGATIVE Final   C Diff interpretation No C. difficile detected.  Final    Comment: Performed at [facility], [address] [place], [place] [address]  Gastrointestinal Panel by PCR , Stool     Status: None   Collection Time: 10/31/21  9:35 AM   Specimen: Stool  Result Value Ref Range Status   Campylobacter species NOT DETECTED NOT DETECTED Final   Plesimonas shigelloides NOT DETECTED NOT DETECTED Final   Salmonella species NOT DETECTED NOT DETECTED Final   Yersinia enterocolitica NOT DETECTED NOT DETECTED Final   Vibrio species NOT DETECTED NOT DETECTED Final   Vibrio cholerae NOT DETECTED NOT DETECTED Final   Enteroaggregative E coli (EAEC) NOT DETECTED NOT DETECTED Final   Enteropathogenic E coli  (EPEC) NOT DETECTED NOT DETECTED Final   Enterotoxigenic E coli (ETEC) NOT DETECTED NOT DETECTED Final   Shiga like toxin producing E coli (STEC) NOT DETECTED NOT DETECTED Final   Shigella/Enteroinvasive E coli (EIEC) NOT DETECTED NOT DETECTED Final   Cryptosporidium NOT DETECTED NOT DETECTED Final   Cyclospora cayetanensis NOT DETECTED NOT DETECTED Final   Entamoeba histolytica NOT DETECTED NOT DETECTED Final   Giardia lamblia NOT DETECTED NOT DETECTED Final   Adenovirus F40/41 NOT DETECTED NOT DETECTED Final   Astrovirus NOT DETECTED NOT DETECTED Final   Norovirus GI/GII NOT DETECTED NOT DETECTED Final   Rotavirus A NOT DETECTED NOT DETECTED Final   Sapovirus (I, II, IV, and V) NOT DETECTED NOT DETECTED Final    Comment: Performed at [facility], [address], [place], [place] [address]      Studies: No results found.  Scheduled Meds:  amLODipine  5 mg Oral QHS   donepezil  5 mg Oral QHS   enalapril  20 mg Oral BID   feeding supplement  237 mL Oral BID BM   meloxicam  15 mg Oral Daily   multivitamin with minerals  1 tablet Oral Daily   potassium chloride  40 mEq Oral Q4H   pravastatin  20 mg Oral QHS   progesterone  100 mg Oral QHS   sertraline  50 mg Oral Daily    Continuous Infusions:  sodium chloride 75 mL/hr at 11/02/21 1012   cefTRIAXone (ROCEPHIN)  IV 2 g (11/02/21 0550)   metronidazole 500 mg (11/02/21 0733)     LOS: 3 days   Arnetha Courser, MD [facility] Pager [phone]  If 7PM-7AM, please contact night-coverage [email or web address] Password [email or web address] 11/02/2021, 3:03 PM

## 2021-11-02 NOTE — Progress Notes (Signed)
Pt refused orthostatic VS d/t not having her back brace here.

## 2021-11-02 NOTE — Progress Notes (Signed)
OT Cancellation Note  Patient Details Name: [name] MRN: [id number] DOB: August 20, 1940   Cancelled Treatment:    Reason Eval/Treat Not Completed: Other (comment) (pt does not have brace, reports she is unable to get up without it however also reports she has not been seen for a follow up and does not know how long she is to continue to wear brace. OT will reattempt as able, with clarification.)  Shanon Payor, OTD OTR/L  11/02/21, 11:07 AM

## 2021-11-03 DIAGNOSIS — I10 Essential (primary) hypertension: Secondary | ICD-10-CM | POA: Diagnosis not present

## 2021-11-03 DIAGNOSIS — D508 Other iron deficiency anemias: Secondary | ICD-10-CM | POA: Diagnosis not present

## 2021-11-03 DIAGNOSIS — K529 Noninfective gastroenteritis and colitis, unspecified: Secondary | ICD-10-CM | POA: Diagnosis not present

## 2021-11-03 DIAGNOSIS — R531 Weakness: Secondary | ICD-10-CM | POA: Diagnosis not present

## 2021-11-03 LAB — CBC WITH DIFFERENTIAL/PLATELET
Abs Immature Granulocytes: 0.27 10*3/uL — ABNORMAL HIGH (ref 0.00–0.07)
Basophils Absolute: 0.1 10*3/uL (ref 0.0–0.1)
Basophils Relative: 1 %
Eosinophils Absolute: 0.2 10*3/uL (ref 0.0–0.5)
Eosinophils Relative: 3 %
HCT: 29.1 % — ABNORMAL LOW (ref 36.0–46.0)
Hemoglobin: 9.4 g/dL — ABNORMAL LOW (ref 12.0–15.0)
Immature Granulocytes: 4 %
Lymphocytes Relative: 15 %
Lymphs Abs: 1 10*3/uL (ref 0.7–4.0)
MCH: 29.7 pg (ref 26.0–34.0)
MCHC: 32.3 g/dL (ref 30.0–36.0)
MCV: 91.8 fL (ref 80.0–100.0)
Monocytes Absolute: 1 10*3/uL (ref 0.1–1.0)
Monocytes Relative: 15 %
Neutro Abs: 4.3 10*3/uL (ref 1.7–7.7)
Neutrophils Relative %: 62 %
Platelets: 240 10*3/uL (ref 150–400)
RBC: 3.17 MIL/uL — ABNORMAL LOW (ref 3.87–5.11)
RDW: 14 % (ref 11.5–15.5)
WBC: 6.7 10*3/uL (ref 4.0–10.5)
nRBC: 0 % (ref 0.0–0.2)

## 2021-11-03 LAB — COMPREHENSIVE METABOLIC PANEL
ALT: 10 U/L (ref 0–44)
AST: 14 U/L — ABNORMAL LOW (ref 15–41)
Albumin: 2.5 g/dL — ABNORMAL LOW (ref 3.5–5.0)
Alkaline Phosphatase: 90 U/L (ref 38–126)
Anion gap: 10 (ref 5–15)
BUN: <5 mg/dL — ABNORMAL LOW (ref 8–23)
CO2: 26 mmol/L (ref 22–32)
Calcium: 8.2 mg/dL — ABNORMAL LOW (ref 8.9–10.3)
Chloride: 104 mmol/L (ref 98–111)
Creatinine, Ser: 0.45 mg/dL (ref 0.44–1.00)
GFR, Estimated: >60 mL/min (ref >60)
Glucose, Bld: 93 mg/dL (ref 70–99)
Potassium: 3.2 mmol/L — ABNORMAL LOW (ref 3.5–5.1)
Sodium: 140 mmol/L (ref 135–145)
Total Bilirubin: 0.6 mg/dL (ref 0.3–1.2)
Total Protein: 5.6 g/dL — ABNORMAL LOW (ref 6.5–8.1)

## 2021-11-03 LAB — CULTURE, BLOOD (SINGLE)
Culture: NO GROWTH
Special Requests: ADEQUATE

## 2021-11-03 LAB — MAGNESIUM: Magnesium: 1.6 mg/dL — ABNORMAL LOW (ref 1.7–2.4)

## 2021-11-03 LAB — PHOSPHORUS: Phosphorus: 3.1 mg/dL (ref 2.5–4.6)

## 2021-11-03 MED ORDER — POTASSIUM CHLORIDE CRYS ER 20 MEQ PO TBCR
40.0000 meq | EXTENDED_RELEASE_TABLET | Freq: Two times a day (BID) | ORAL | Status: AC
Start: 2021-11-03 — End: 2021-11-03
  Administered 2021-11-03 (×2): 40 meq via ORAL
  Filled 2021-11-03 (×2): qty 2

## 2021-11-03 MED ORDER — MAGNESIUM SULFATE 2 GM/50ML IV SOLN
2.0000 g | Freq: Once | INTRAVENOUS | Status: AC
  Administered 2021-11-03: 2 g via INTRAVENOUS
  Filled 2021-11-03: qty 50

## 2021-11-03 MED ORDER — POTASSIUM CHLORIDE 10 MEQ/100ML IV SOLN
10.0000 meq | INTRAVENOUS | Status: DC
  Filled 2021-11-03: qty 100

## 2021-11-03 NOTE — [place] FL2 (Signed)
[place] MEDICAID FL2 LEVEL OF CARE SCREENING TOOL     IDENTIFICATION  Patient Name: [name] Birthdate: 1941/05/14 Sex: female Admission Date (Current Location): 10/29/2021  [facility] and [place] Number:  Producer, television/film/video and Address:  [facility],  [address] [place], [place] [address]      Provider Number: [id number]  Attending Physician Name and Address:  Merlene Laughter, DO  Relative Name and Phone Number:       Current Level of Care: Hospital Recommended Level of Care: Skilled Nursing Facility Prior Approval Number:    Date Approved/Denied:   PASRR Number: [id number]  Discharge Plan: SNF    Current Diagnoses: Patient Active Problem List   Diagnosis Date Noted   Anemia 10/29/2021   Generalized weakness 10/29/2021   Sepsis (HCC) 10/29/2021   Colitis 10/29/2021   Other spondylosis with radiculopathy, lumbar region 02/09/2021   Lumbar stenosis with neurogenic claudication 09/03/2019   Trochanteric bursitis, right hip 01/14/2019   Lumbar radiculopathy, chronic 06/29/2018   OA (osteoarthritis) of knee 09/19/2016   Palpitations 12/09/2015   Right carotid bruit 12/09/2015   Syncope 12/08/2015   Degenerative disc disease, lumbar 12/08/2015   Essential hypertension 12/08/2015   High cholesterol    Arthritis     Orientation RESPIRATION BLADDER Height & Weight     Self, Time, Situation, Place  Normal Continent Weight: 82.1 kg Height:     BEHAVIORAL SYMPTOMS/MOOD NEUROLOGICAL BOWEL NUTRITION STATUS      Continent Diet (regular)  AMBULATORY STATUS COMMUNICATION OF NEEDS Skin   Extensive Assist Verbally Normal                       Personal Care Assistance Level of Assistance  Bathing, Feeding, Dressing Bathing Assistance: Limited assistance Feeding assistance: Limited assistance Dressing Assistance: Limited assistance     Functional Limitations Info  Sight, Hearing, Speech Sight Info: Adequate Hearing Info: Adequate Speech  Info: Adequate    SPECIAL CARE FACTORS FREQUENCY  PT (By licensed PT), OT (By licensed OT)     PT Frequency: 5 x weekly OT Frequency: 5 x weekly            Contractures Contractures Info: Not present    Additional Factors Info  Code Status Code Status Info: full             Current Medications (11/03/2021):  This is the current hospital active medication list Current Facility-Administered Medications  Medication Dose Route Frequency Provider Last Rate Last Admin   acetaminophen (TYLENOL) tablet 650 mg  650 mg Oral Q6H PRN Hillary Bow, DO   650 mg at 11/01/21 0414   Or   acetaminophen (TYLENOL) suppository 650 mg  650 mg Rectal Q6H PRN Hillary Bow, DO       amLODipine (NORVASC) tablet 5 mg  5 mg Oral QHS Dow Adolph N, DO   5 mg at 11/02/21 2232   antiseptic oral rinse (BIOTENE) solution 15 mL  15 mL Mouth Rinse PRN Jimmye Norman, NP       cefTRIAXone (ROCEPHIN) 2 g in sodium chloride 0.9 % 100 mL IVPB  2 g Intravenous Q24H Lyda Perone M, DO 200 mL/hr at 11/03/21 0541 2 g at 11/03/21 0541   donepezil (ARICEPT) tablet 5 mg  5 mg Oral QHS Lyda Perone M, DO   5 mg at 11/02/21 2232   enalapril (VASOTEC) tablet 20 mg  20 mg Oral BID Darlin Drop, DO  20 mg at 11/03/21 6283   feeding supplement (ENSURE ENLIVE / ENSURE PLUS) liquid 237 mL  237 mL Oral BID BM Spongberg, Susy Frizzle, MD       fentaNYL (SUBLIMAZE) injection 25 mcg  25 mcg Intravenous Q2H PRN Jimmye Norman, NP   25 mcg at 11/02/21 0353   gabapentin (NEURONTIN) capsule 300 mg  300 mg Oral Daily PRN Hillary Bow, DO   300 mg at 10/30/21 2131   HYDROcodone-acetaminophen (NORCO/VICODIN) 5-325 MG per tablet 1 tablet  1 tablet Oral Q4H PRN Hillary Bow, DO   1 tablet at 11/03/21 1355   lip balm (CARMEX) ointment   Topical PRN Noralee Stain, DO   75 application. at 10/31/21 0300   loperamide (IMODIUM) capsule 4 mg  4 mg Oral PRN Arnetha Courser, MD   4 mg at 11/02/21 0859    meloxicam (MOBIC) tablet 15 mg  15 mg Oral Daily Hillary Bow, DO   15 mg at 11/03/21 1517   methocarbamol (ROBAXIN) tablet 500 mg  500 mg Oral Q6H PRN Hillary Bow, DO   500 mg at 11/01/21 2131   metroNIDAZOLE (FLAGYL) IVPB 500 mg  500 mg Intravenous Q12H Hillary Bow, DO 100 mL/hr at 11/03/21 0629 500 mg at 11/03/21 6160   multivitamin with minerals tablet 1 tablet  1 tablet Oral Daily Marzetta Board, MD   1 tablet at 11/03/21 0927   ondansetron (ZOFRAN) tablet 4 mg  4 mg Oral Q6H PRN Hillary Bow, DO       Or   ondansetron [facility]) injection 4 mg  4 mg Intravenous Q6H PRN Hillary Bow, DO   4 mg at 11/02/21 0912   potassium chloride SA (KLOR-CON M) CR tablet 40 mEq  40 mEq Oral BID Marguerita Merles [place], DO   40 mEq at 11/03/21 1356   pravastatin (PRAVACHOL) tablet 20 mg  20 mg Oral QHS Lyda Perone M, DO   20 mg at 11/02/21 2232   promethazine (PHENERGAN) tablet 25 mg  25 mg Oral Q6H PRN Marzetta Board, MD   25 mg at 10/31/21 2125   Or   prochlorperazine (COMPAZINE) injection 10 mg  10 mg Intravenous Q6H PRN Marzetta Board, MD   10 mg at 10/31/21 0503   Or   promethazine (PHENERGAN) suppository 25 mg  25 mg Rectal Q6H PRN Marzetta Board, MD       progesterone (PROMETRIUM) capsule 100 mg  100 mg Oral QHS Lyda Perone M, DO   100 mg at 11/02/21 2231   sertraline (ZOLOFT) tablet 50 mg  50 mg Oral Daily Lyda Perone M, DO   50 mg at 11/03/21 7371     Discharge Medications: Please see discharge summary for a list of discharge medications.  Relevant Imaging Results:  Relevant Lab Results:   Additional Information SSN [id number]   Pfizer COVID-19 Vaccine 04/20/2020  Golda Acre, RN

## 2021-11-03 NOTE — Plan of Care (Signed)
°  Problem: Education: °Goal: Knowledge of General Education information will improve °Description: Including pain rating scale, medication(s)/side effects and non-pharmacologic comfort measures °Outcome: Progressing °  °Problem: Coping: °Goal: Level of anxiety will decrease °Outcome: Progressing °  °Problem: Pain Managment: °Goal: General experience of comfort will improve °Outcome: Progressing °  °Problem: Safety: °Goal: Ability to remain free from injury will improve °Outcome: Progressing °  °Problem: Skin Integrity: °Goal: Risk for impaired skin integrity will decrease °Outcome: Progressing °  °Problem: Respiratory: °Goal: Ability to maintain adequate ventilation will improve °Outcome: Progressing °  °

## 2021-11-03 NOTE — TOC Progression Note (Addendum)
Transition of Care [facility]) - Progression Note    Patient Details  Name: [name] MRN: [id number] Date of Birth: [birth year]  Transition of [facility]) CM/SW Contact  Golda Acre, RN Phone Number: 11/03/2021, 9:07 AM  Clinical Narrative:    Following for toc needs. Will need snf placement is from [facility]/ auth [facility] for [facility] through [facility].  Expected Discharge Plan: Home/Self Care Barriers to Discharge: Continued Medical Work up  Expected Discharge Plan and Services Expected Discharge Plan: Home/Self Care   Discharge Planning Services: CM Consult   Living arrangements for the past 2 months: Skilled Nursing Facility ([facility] before that was at [facility] for rehab)                                       Social Determinants of Health (SDOH) Interventions    Readmission Risk Interventions     View : No data to display.

## 2021-11-03 NOTE — Progress Notes (Signed)
Physical Therapy Treatment Patient Details Name: [name] MRN: [id number] DOB: December 10, 1940 Today's Date: 11/03/2021   History of Present Illness Pt is an 81 y.o. female with recent herniated nucleus pulposus with severe stenosis T12-L1, s/p T12-L1 PLIF, T10-L3 posterior arthrodesis pedicle fixation on 09/28/21. PMH includes prior back sxs (most recently 08/2019), bilateral TKA (2011, 2018), HTN, arthritis, anxiety.  Pt presented to [facility] ED with complaints of generalized weakness.  Since surgery she has had ongoing back pain, generalized weakness and prior to admission had near syncope at home.  MRI of spine showed a new T9 compression fracture with 20% height loss, Dr. Wyonia Hough, discussed the case with patient's on-call neurosurgical team-no surgical indications, recommended to manage conservatively.  Patient also had complaints of abdominal pain for which a CT scan was obtained showing colitis    PT Comments    Pt tolerated an increased ambulation distance of 56' with RW, pain and fatigue limit activity tolerance. Pt required ongoing verbal cues for safe hand placement on her RW. Her brother was in the room and stated that pt does not have assistance available at home. Pt is not functioning well enough to DC home alone. ST-SNF recommended.    Recommendations for follow up therapy are one component of a multi-disciplinary discharge planning process, led by the attending physician.  Recommendations may be updated based on patient status, additional functional criteria and insurance authorization.  Follow Up Recommendations  Skilled nursing-short term rehab (<3 hours/day)     Assistance Recommended at Discharge Intermittent Supervision/Assistance  Patient can return home with the following A little help with walking and/or transfers;A little help with bathing/dressing/bathroom;Assistance with cooking/housework;Assist for transportation;Help with stairs or ramp for entrance   Equipment  Recommendations  None recommended by PT    Recommendations for Other Services       Precautions / Restrictions Precautions Precautions: Fall;Back Required Braces or Orthoses: Spinal Brace Spinal Brace: Lumbar corset;Applied in sitting position     Mobility  Bed Mobility               General bed mobility comments: up in recliner    Transfers Overall transfer level: Needs assistance Equipment used: Rolling walker (2 wheels) Transfers: Sit to/from Stand Sit to Stand: Min assist           General transfer comment: multiple verbal cues for safe hand placement (pt had hand on release button on RW, she was resistant to moving her hand); min A to power up    Ambulation/Gait Ambulation/Gait assistance: Min guard Gait Distance (Feet): 65 Feet Assistive device: Rolling walker (2 wheels) Gait Pattern/deviations: Step-through pattern, Decreased stride length Gait velocity: decr     General Gait Details: pt with slow but steady gait with RW, limited distance due to fatigue; pt denied any dizziness   Stairs             Wheelchair Mobility    Modified Rankin (Stroke Patients Only)       Balance Overall balance assessment: Needs assistance   Sitting balance-Leahy Scale: Good       Standing balance-Leahy Scale: Fair Standing balance comment: reliant upon BUE support for dynamic standing                            Cognition Arousal/Alertness: Awake/alert Behavior During Therapy: WFL for tasks assessed/performed Overall Cognitive Status: Within Functional Limits for tasks assessed  Exercises      General Comments        Pertinent Vitals/Pain Pain Assessment Pain Score: 5  Pain Location: back with walking Pain Descriptors / Indicators: Cramping Pain Intervention(s): Limited activity within patient's tolerance, Monitored during session, Premedicated before session, Repositioned     Home Living                          Prior Function            PT Goals (current goals can now be found in the care plan section) Acute Rehab PT Goals PT Goal Formulation: With patient Time For Goal Achievement: 11/16/21 Potential to Achieve Goals: Good Progress towards PT goals: Progressing toward goals    Frequency    Min 3X/week      PT Plan Discharge plan needs to be updated    Co-evaluation              AM-PAC PT "6 Clicks" Mobility   Outcome Measure  Help needed turning from your back to your side while in a flat bed without using bedrails?: A Little Help needed moving from lying on your back to sitting on the side of a flat bed without using bedrails?: A Little Help needed moving to and from a bed to a chair (including a wheelchair)?: A Little Help needed standing up from a chair using your arms (e.g., wheelchair or bedside chair)?: A Little Help needed to walk in hospital room?: A Little Help needed climbing 3-5 steps with a railing? : A Lot 6 Click Score: 17    End of Session Equipment Utilized During Treatment: Back brace Activity Tolerance: Patient limited by fatigue Patient left: in bed;with call bell/phone within reach;with family/visitor present Nurse Communication: Mobility status PT Visit Diagnosis: Difficulty in walking, not elsewhere classified (R26.2);Pain     Time: [id number] PT Time Calculation (min) (ACUTE ONLY): 13 min  Charges:  $Gait Training: 8-22 mins                     Blondell Reveal Kistler PT 11/03/2021  Acute Rehabilitation Services Pager [phone] Office [phone]

## 2021-11-03 NOTE — Progress Notes (Signed)
Gave report to [facility], RN about patient, who will resume care of the patient until end of shift. Patient was stable and in bed upon giving on-coming nurse bedside patient handoff.

## 2021-11-03 NOTE — Evaluation (Signed)
Occupational Therapy Evaluation Patient Details Name: [name] MRN: [id number] DOB: 14-Jan-1941 Today's Date: 11/03/2021   History of Present Illness Pt is an 81 y.o. female with recent herniated nucleus pulposus with severe stenosis T12-L1, s/p T12-L1 PLIF, T10-L3 posterior arthrodesis pedicle fixation on 09/28/21. PMH includes prior back sxs (most recently 08/2019), bilateral TKA (2011, 2018), HTN, arthritis, anxiety.  Pt presented to [facility] ED with complaints of generalized weakness.  Since surgery she has had ongoing back pain, generalized weakness and prior to admission had near syncope at home.  MRI of spine showed a new T9 compression fracture with 20% height loss, Dr. Lurene Shadow, discussed the case with patient's on-call neurosurgical team-no surgical indications, recommended to manage conservatively.  Patient also had complaints of abdominal pain for which a CT scan was obtained showing colitis   Clinical Impression   Patient is a 81 year old female who was admitted for above. Patient was living at home alone prior level. Currently, patient is min A for bed mobility with cues for back precautions. Patient is max A for hygiene and LB dressing tasks with min A for UB dressing/ donning back brace. Patient is not at a level to be able to transition home alone successfully at this time.  Patient would continue to benefit from skilled OT services at this time while admitted and after d/c to address noted deficits in order to improve overall safety and independence in ADLs.       Recommendations for follow up therapy are one component of a multi-disciplinary discharge planning process, led by the attending physician.  Recommendations may be updated based on patient status, additional functional criteria and insurance authorization.   Follow Up Recommendations  Skilled nursing-short term rehab (<3 hours/day)    Assistance Recommended at Discharge Frequent or constant Supervision/Assistance  Patient  can return home with the following Assistance with cooking/housework;Direct supervision/assist for financial management;Assist for transportation;A little help with walking and/or transfers;A lot of help with bathing/dressing/bathroom;Help with stairs or ramp for entrance;Direct supervision/assist for medications management    Functional Status Assessment  Patient has had a recent decline in their functional status and demonstrates the ability to make significant improvements in function in a reasonable and predictable amount of time.  Equipment Recommendations  None recommended by OT    Recommendations for Other Services       Precautions / Restrictions Precautions Precautions: Fall;Back Required Braces or Orthoses: Spinal Brace Spinal Brace: Lumbar corset;Applied in sitting position Restrictions Weight Bearing Restrictions: No      Mobility Bed Mobility Overal bed mobility: Needs Assistance Bed Mobility: Rolling, Sidelying to Sit, Sit to Sidelying Rolling: Supervision Sidelying to sit: Supervision       General bed mobility comments: with education on log rolling and keeping shoulders and hips in line    Transfers                          Balance Overall balance assessment: Needs assistance   Sitting balance-Leahy Scale: Good     Standing balance support: During functional activity, Reliant on assistive device for balance Standing balance-Leahy Scale: Poor                             ADL either performed or assessed with clinical judgement   ADL Overall ADL's : Needs assistance/impaired Eating/Feeding: Set up;Sitting   Grooming: Supervision/safety;Sitting;Set up Grooming Details (indicate cue type and reason): patient  was min A to compelte shower cap on this date with increased time. Upper Body Bathing: Moderate assistance;Sitting   Lower Body Bathing: Maximal assistance;Sitting/lateral leans   Upper Body Dressing : Sitting;Minimal  assistance Upper Body Dressing Details (indicate cue type and reason): with lumbar corset sitting on edge of bed. Lower Body Dressing: Maximal assistance;Sitting/lateral leans   Toilet Transfer: Minimal assistance;Rolling walker (2 wheels) Toilet Transfer Details (indicate cue type and reason): with min A to power up and min guard to transfer from edge of bed to recliner on opposite side of room with increased time. Toileting- Clothing Manipulation and Hygiene: Maximal assistance;Sit to/from stand Toileting - Clothing Manipulation Details (indicate cue type and reason): needed BUE suport to maintain balance     Functional mobility during ADLs: Minimal assistance;Rolling walker (2 wheels)       Vision Patient Visual Report: No change from baseline       Perception     Praxis      Pertinent Vitals/Pain Pain Assessment Pain Assessment: Faces Faces Pain Scale: Hurts little more Pain Location: back with walking and headache Pain Descriptors / Indicators: Constant, Headache Pain Intervention(s): Limited activity within patient's tolerance, Monitored during session, Premedicated before session, Repositioned     Hand Dominance Right   Extremity/Trunk Assessment Upper Extremity Assessment Upper Extremity Assessment: Overall WFL for tasks assessed   Lower Extremity Assessment Lower Extremity Assessment: Defer to PT evaluation   Cervical / Trunk Assessment Cervical / Trunk Assessment: Normal   Communication Communication Communication: No difficulties   Cognition Arousal/Alertness: Awake/alert Behavior During Therapy: WFL for tasks assessed/performed Overall Cognitive Status: Within Functional Limits for tasks assessed                                       General Comments       Exercises     Shoulder Instructions      Home Living Family/patient expects to be discharged to:: Private residence Living Arrangements: Alone Available Help at Discharge:  Friend(s);Available PRN/intermittently Type of Home: House Home Access: Level entry     Home Layout: One level     Bathroom Shower/Tub: Producer, television/film/video: Handicapped height Bathroom Accessibility: Yes   Home Equipment: Agricultural consultant (2 wheels);Cane - single point;Shower seat;Adaptive equipment Adaptive Equipment: Reacher;Sock aid Additional Comments: pt reports "I can call someone for help if needed..." not forthcoming with further details regarding assist available at d/c      Prior Functioning/Environment Prior Level of Function : Independent/Modified Independent             Mobility Comments: pt reports she was ambulatory with RW upon return home after lumbar surgeryz ADLs Comments: pt reports she was able to complete ADLs, uses depends        OT Problem List: Decreased activity tolerance;Impaired balance (sitting and/or standing);Decreased safety awareness;Decreased knowledge of precautions;Decreased knowledge of use of DME or AE;Cardiopulmonary status limiting activity      OT Treatment/Interventions: Self-care/ADL training;Therapeutic exercise;Neuromuscular education;Energy conservation;DME and/or AE instruction;Therapeutic activities;Balance training;Patient/family education    OT Goals(Current goals can be found in the care plan section) Acute Rehab OT Goals Patient Stated Goal: to get better OT Goal Formulation: With patient Time For Goal Achievement: 11/17/21 Potential to Achieve Goals: Good  OT Frequency: Min 2X/week    Co-evaluation              AM-PAC OT "6 Clicks"  Daily Activity     Outcome Measure Help from another person eating meals?: None Help from another person taking care of personal grooming?: A Little Help from another person toileting, which includes using toliet, bedpan, or urinal?: A Lot Help from another person bathing (including washing, rinsing, drying)?: A Lot Help from another person to put on and taking off  regular upper body clothing?: A Little Help from another person to put on and taking off regular lower body clothing?: A Lot 6 Click Score: 16   End of Session Equipment Utilized During Treatment: Gait belt;Rolling walker (2 wheels) Nurse Communication: Mobility status  Activity Tolerance: Patient tolerated treatment well Patient left: in chair;with call bell/phone within reach;with nursing/sitter in room  OT Visit Diagnosis: Unsteadiness on feet (R26.81);Other abnormalities of gait and mobility (R26.89);Pain                Time: 1000-1032 OT Time Calculation (min): 32 min Charges:  OT General Charges $OT Visit: 1 Visit OT Evaluation $OT Eval Moderate Complexity: 1 Mod OT Treatments $Self Care/Home Management : 8-22 mins  [name] OTR/L, MS Acute Rehabilitation Department Office# [phone] Pager# [phone]   Ardyth HarpsMolly M Norleen Xie 11/03/2021, 3:14 PM

## 2021-11-03 NOTE — Progress Notes (Signed)
PROGRESS NOTE    [name]  [id number] DOB: 1940/08/17 DOA: 10/29/2021 PCP: Burnard Bunting, MD   Brief Narrative:  The patient is an 81 year old Caucasian female with a past medical history significant for but not limited to hypertension, history of lumbar radiculopathy status post fusion surgery of T12-L1 performed on 09/28/2021 with other comorbidities who presented to the [facility] ED with complaints of generalized weakness.  Since her surgery she has had ongoing back pain and generalized weakness and prior to admission she had near syncope at home.  MRI of lumbar spine showed a new T9 compression fracture with 20% height loss and Dr. Wyonia Hough discussed with the on-call neurosurgical team who recommended no surgical intervention at this time and recommended managing this conservatively.  Patient is also had complaints of abdominal pain for which a CT scan was done and showed colitis.  C. difficile PCR and GI pathogen panel negative.  Patient has been initiated on IV antibiotics with ceftriaxone and IV Flagyl on 10/30/2021.  Patient continues to have back pain and 8 out of 10 is worse with movement and yesterday she continued to experience some diarrhea had some intermittent nausea but no vomiting.  She refused orthostatic vital signs yesterday morning as she did not have a back brace and PT OT still recommend home health services  Today she is found to have a hypokalemia and WBC is now trending down   Assessment and Plan: * Generalized weakness Following lumbar spinal surgery last month. DDx includes: 1) Symptomatic anemia: would explain presentation today including: generalized weakness, orthostatic hypotension, mild tachycardia, pulm vasc congestion on CXR despite no PMH of CHF (seems most likely the culprit, see anemia below) 2) infection: no WBC, could explain the anemia though.  Usually would expect post-op infection to onset a bit faster.  But imaging should help evaluate for this.   Reportedly had fever 100.4 in ED per EDP note. 3) PE: could explain fever, wouldn't explain anemia though, and she would have had an unusually good response to IVF bolus for a PE. 4) Spinal cord compression: wouldn't really explain the anemia, ortho stasis, tachycardia a month later though; also wouldn't explain the generalized weakness not localized to legs, also no report of neurogenic bladder like symptoms. (MRI should be able to rule this out). 5) new onset CHF: why ortho stasis if volume overloaded though?  Colitis CT AP back now showing descending colon colitis. Will de-escalate ABx to rocephin + flagyl EDP has ordered c.diff Will add on GI pathogen pnl Has had a couple of BMs since being in ED, though apparently not diarrhea per patient.  Sepsis ([place]) Question of sepsis: Pt does have tachycardia. Apparently fever 100.4 in ED per EDP note. Got empiric cefepime, flagyl, vanc in ED Got 1L LR bolus and LR going at 125 for the moment BP improved to [id number] systolic after fluid bolus (now [id number] systolic) Lactate nl Checking procalcitonin BCx pending CXR neg UA neg CT AP pending MRI L and T spine pending  Anemia Looks like this onset a month ago after lumbar surgery. HGB 9.1 today compared to 9.6 on DC. Not clear why its persistent (and possibly worsening) though a month later?! Ongoing blood loss? Getting CT AP Getting MRI of T and L spine Getting hemoccult, but no frank melena nor hematochezia  Lumbar stenosis with neurogenic claudication Recent spinal fusion surgery at end of March MRI of T and L spine is pending  Essential hypertension Orthostatic hypotension today. 1) holding home  BP meds for the moment   Colitis WITH Sepsis and weakness -Following lumbar spinal surgery last month. -Will treat colitis, IV Rocephin 2 g daily and IV Flagyl 500 mg twice daily initiated on 10/30/2021-to complete a 5-day course but may need a longer course -C. difficile PCR and GI panel by  PCR negative. -WBC went from 15.2 -> 14.8 -> 15.8 -> 11.3 -> 6.7 -Continue to Monitor fever curve and WBC. -Continue with loperamide 4 mg p.o. as needed for diarrhea or loose stools -Getting IV fluid hydration with normal saline we will stop today -PT/OT recommending home Health    T9 compression fracture acute -Dr. Wyonia Hough discussed with [name] neurosurgy for opinion in setting of recent T12/L1 -No surgical recommendations, will have the patient follow-up in the outpatient setting.   Presyncope -Obtain orthostatic vital signs -Fall precautions -PT OT to assess and recommending [facility]   Hypomagnesemia -Patient's mag level is 1.6 -Replete with IV mag sulfate 2 g -Continue to monitor and replete as necessary -Repeat magnesium level in the a.m.  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 -Patient's hemoglobin/hematocrit is relatively stable now at 9.4/29.1 -Check anemia panel in the a.m; -Continue monitor for signs and symptoms of bleeding; no overt bleeding noted -Repeat CMP   Lumbar stenosis with neurogenic claudication -Recent spinal fusion surgery at end of March -MRI of T and L spine T9 COMP FX AS ABOVE -[place] NEUROSURGERY-AS ABOVE -Analgesics as needed with hydrocodone/acetaminophen 1 tab p.o. every 4 as needed for moderate pain and severe pain and also has fentanyl 25 mcg IV every 2 as needed severe pain -Also on meloxicam 50 mg p.o. daily and methocarbamol 500 g p.o. every 6 as needed for muscle spasms   Essential Hypertension -BP was not at goal, elevated yesterday but is now improved   -Restarted home oral antihypertensives with enalapril 20 mg p.o. twice daily -Continue monitor blood pressures per protocol -Last  blood pressure reading was 131/63  Hypoalbuminemia -Albumin level is now 2.5 -Continue monitor and trend and repeat CMP in a.m.  Hyperlipidemia -Continue with pravastatin 20 mg p.o. nightly  Depression and Anxiety -Continue sertraline 50 mg p.o. daily  Dementia -Continue donepezil 5 mg p.o. nightly  DVT prophylaxis: SCDs Start: 10/29/21 2027    Code Status: Full Code Family Communication: No family present at bedside   Disposition Plan:  Level of care: Progressive Status is: Inpatient Remains inpatient appropriate because: He is further clinical improvement anticipate discharge in the next 24 to 48 hours   Consultants:  Dr. Wyonia Hough discussed with neurosurgery  Procedures:  None  Antimicrobials:  Anti-infectives (From admission, onward)    Start     Dose/Rate Route Frequency Ordered Stop   10/30/21 0730  metroNIDAZOLE (FLAGYL) IVPB 500 mg        500 mg 100 mL/hr over 60 Minutes Intravenous Every 12 hours 10/29/21 2037     10/30/21 0500  cefTRIAXone (ROCEPHIN) 2 g in sodium chloride 0.9 % 100 mL IVPB        2 g 200 mL/hr over 30 Minutes Intravenous Every 24 hours 10/29/21 2037     10/29/21 1700  ceFEPIme (MAXIPIME) 2 g in sodium chloride 0.9 % 100 mL IVPB  2 g 200 mL/hr over 30 Minutes Intravenous  Once 10/29/21 1646 10/29/21 1743   10/29/21 1700  metroNIDAZOLE (FLAGYL) IVPB 500 mg        500 mg 100 mL/hr over 60 Minutes Intravenous  Once 10/29/21 1646 10/29/21 2023   10/29/21 1700  vancomycin (VANCOCIN) IVPB 1000 mg/200 mL premix  Status:  Discontinued        1,000 mg 200 mL/hr over 60 Minutes Intravenous  Once 10/29/21 1646 10/29/21 1649   10/29/21 1700  vancomycin (VANCOREADY) IVPB 1500 mg/300 mL        1,500 mg 150 mL/hr over 120 Minutes Intravenous  Once 10/29/21 1649 10/29/21 2131       Subjective: Seen and examined at bedside she is sitting in a chair and states that when she does not move her back pain does not hurt.  States when she does move  she hurts and at least an 8 out of 10.  No nausea or vomiting. Still having some bowel movements but minimal abdominal pain.  Denies any lightheadedness or dizziness.  No other concerns or complaints at this time.  Objective: Vitals:   11/03/21 0618 11/03/21 0927 11/03/21 1044 11/03/21 1246  BP: (!) 154/75 (!) 150/70 117/83 131/63  Pulse: 86  84 80  Resp: 20   18  Temp: 98.2 F (36.8 C)   97.8 F (36.6 C)  TempSrc: Oral   Oral  SpO2: 95%   95%  Weight:        Intake/Output Summary (Last 24 hours) at 11/03/2021 1435 Last data filed at 11/03/2021 1153 Gross per 24 hour  Intake 2128.35 ml  Output 2950 ml  Net -821.65 ml   Filed Weights   11/03/21 0500  Weight: 82.1 kg   Examination: Physical Exam:  Constitutional: WN/WD overweight elderly Caucasian female currently no acute distress appears slightly uncomfortable sitting in chair at bedside Respiratory: Diminished to auscultation bilaterally with coarse breath sounds, no wheezing, rales, rhonchi or crackles. Normal respiratory effort and patient is not tachypenic. No accessory muscle use.  Unlabored breathing Cardiovascular: RRR, no murmurs / rubs / gallops. S1 and S2 auscultated.  No appreciable extremity edema Abdomen: Soft, mildly-tender, distended secondary body habitus bowel sounds positive.  GU: Deferred. Musculoskeletal: Wearing a back brace Neurologic: CN 2-12 grossly intact with no focal deficits. Romberg sign and cerebellar reflexes not assessed.  Psychiatric: Normal judgment and insight. Alert and oriented x 3. Normal mood and appropriate affect.   Data Reviewed: I have personally reviewed following labs and imaging studies  CBC: Recent Labs  Lab 10/29/21 1244 10/30/21 0331 10/31/21 0339 11/01/21 0323 11/02/21 0329 11/03/21 0826  WBC 7.1 15.2* 14.8* 15.8* 11.3* 6.7  NEUTROABS 5.6  --  12.6* 13.3* 8.8* 4.3  HGB 9.1* 9.0* 8.9* 8.8* 9.8* 9.4*  HCT 29.4* 28.1* 28.7* 26.8* 30.5* 29.1*  MCV 96.1 94.3 97.6 94.0  93.8 91.8  PLT 211 192 177 214 231 [id number]   Basic Metabolic Panel: Recent Labs  Lab 10/30/21 0331 10/31/21 0339 11/01/21 0323 11/02/21 0329 11/03/21 0826  NA 137 135 141 141 140  K 4.3 3.4* 3.1* 3.0* 3.2*  CL 109 107 112* 109 104  CO2 23 20* 23 23 26   GLUCOSE 102* 91 84 81 93  BUN 16 13 9  6* <5*  CREATININE 1.08* 0.76 0.59 0.56 0.45  CALCIUM 8.8* 8.1* 8.3* 8.5* 8.2*  MG  --   --   --  1.7 1.6*  PHOS  --   --   --   --  3.1   GFR: Estimated Creatinine Clearance: 61.8 mL/min (by C-G formula based on SCr of 0.45 mg/dL). Liver Function Tests: Recent Labs  Lab 10/29/21 1244 11/03/21 0826  AST 20 14*  ALT 12 10  ALKPHOS 113 90  BILITOT 0.5 0.6  PROT 6.6 5.6*  ALBUMIN 3.4* 2.5*   No results for input(s): LIPASE, AMYLASE in the last 168 hours. No results for input(s): AMMONIA in the last 168 hours. Coagulation Profile: No results for input(s): INR, PROTIME in the last 168 hours. Cardiac Enzymes: No results for input(s): CKTOTAL, CKMB, CKMBINDEX, TROPONINI in the last 168 hours. BNP (last 3 results) No results for input(s): PROBNP in the last 8760 hours. HbA1C: No results for input(s): HGBA1C in the last 72 hours. CBG: No results for input(s): GLUCAP in the last 168 hours. Lipid Profile: No results for input(s): CHOL, HDL, LDLCALC, TRIG, CHOLHDL, LDLDIRECT in the last 72 hours. Thyroid Function Tests: No results for input(s): TSH, T4TOTAL, FREET4, T3FREE, THYROIDAB in the last 72 hours. Anemia Panel: No results for input(s): VITAMINB12, FOLATE, FERRITIN, TIBC, IRON, RETICCTPCT in the last 72 hours. Sepsis Labs: Recent Labs  Lab 10/29/21 1244 10/29/21 1405 10/29/21 1706 10/30/21 0331 10/31/21 0339  PROCALCITON <0.10  --   --  0.58 0.37  LATICACIDVEN  --  1.2 1.6  --   --     Recent Results (from the past 240 hour(s))  Culture, blood (single)     Status: None   Collection Time: 10/29/21  5:06 PM   Specimen: BLOOD  Result Value Ref Range Status   Specimen  Description   Final    BLOOD LEFT ANTECUBITAL Performed at [facility], [place] [address] [place], [place] [address]    Special Requests   Final    BOTTLES DRAWN AEROBIC AND ANAEROBIC Blood Culture adequate volume Performed at [place] [address], [place], [place] [address]    Culture   Final    NO GROWTH 5 DAYS Performed at [facility], [place] [address], [place], [place] [address]    Report Status 11/03/2021 FINAL  Final  C Difficile Quick Screen w PCR reflex     Status: None   Collection Time: 10/31/21  9:35 AM   Specimen: Stool  Result Value Ref Range Status   C Diff antigen NEGATIVE NEGATIVE Final   C Diff toxin NEGATIVE NEGATIVE Final   C Diff interpretation No C. difficile detected.  Final    Comment: Performed at [facility], [place] [address] [place], [place] [address]  Gastrointestinal Panel by PCR , Stool     Status: None   Collection Time: 10/31/21  9:35 AM   Specimen: Stool  Result Value Ref Range Status   Campylobacter species NOT DETECTED NOT DETECTED Final   Plesimonas shigelloides NOT DETECTED NOT DETECTED Final   Salmonella species NOT DETECTED NOT DETECTED Final   Yersinia enterocolitica NOT DETECTED NOT DETECTED Final   Vibrio species NOT DETECTED NOT DETECTED Final   Vibrio cholerae NOT DETECTED NOT DETECTED Final   Enteroaggregative E coli (EAEC) NOT DETECTED NOT DETECTED Final   Enteropathogenic E coli (EPEC) NOT DETECTED NOT DETECTED Final   Enterotoxigenic E coli (ETEC) NOT DETECTED NOT DETECTED Final   Shiga like toxin producing E coli (STEC) NOT DETECTED NOT DETECTED Final   Shigella/Enteroinvasive E coli (EIEC) NOT DETECTED NOT DETECTED Final   Cryptosporidium NOT DETECTED NOT DETECTED Final   Cyclospora cayetanensis NOT DETECTED NOT DETECTED  Final   Entamoeba histolytica NOT DETECTED NOT DETECTED Final   Giardia lamblia NOT DETECTED NOT DETECTED Final   Adenovirus F40/41 NOT  DETECTED NOT DETECTED Final   Astrovirus NOT DETECTED NOT DETECTED Final   Norovirus GI/GII NOT DETECTED NOT DETECTED Final   Rotavirus A NOT DETECTED NOT DETECTED Final   Sapovirus (I, II, IV, and V) NOT DETECTED NOT DETECTED Final    Comment: Performed at [facility], [address], [place], [place] [address]    Radiology Studies: No results found.  Scheduled Meds:  amLODipine  5 mg Oral QHS   donepezil  5 mg Oral QHS   enalapril  20 mg Oral BID   feeding supplement  237 mL Oral BID BM   meloxicam  15 mg Oral Daily   multivitamin with minerals  1 tablet Oral Daily   potassium chloride  40 mEq Oral BID   pravastatin  20 mg Oral QHS   progesterone  100 mg Oral QHS   sertraline  50 mg Oral Daily   Continuous Infusions:  sodium chloride 75 mL/hr at 11/03/21 0037   cefTRIAXone (ROCEPHIN)  IV 2 g (11/03/21 0541)   metronidazole 500 mg (11/03/21 0629)    LOS: 4 days   Raiford Noble, DO [facility] Available via Epic secure chat 7am-7pm After these hours, please refer to coverage provider listed on [email or web address] 11/03/2021, 2:35 PM

## 2021-11-03 NOTE — Care Management Important Message (Signed)
Important Message  Patient Details IM Letter placed in Patients room. Name: [name] MRN: [id number] Date of Birth: [birth year]   Medicare Important Message Given:  Yes     Kerin Salen 11/03/2021, 11:46 AM

## 2021-11-04 LAB — CBC WITH DIFFERENTIAL/PLATELET
Abs Immature Granulocytes: 0.33 10*3/uL — ABNORMAL HIGH (ref 0.00–0.07)
Basophils Absolute: 0.1 10*3/uL (ref 0.0–0.1)
Basophils Relative: 1 %
Eosinophils Absolute: 0.1 10*3/uL (ref 0.0–0.5)
Eosinophils Relative: 1 %
HCT: 29.5 % — ABNORMAL LOW (ref 36.0–46.0)
Hemoglobin: 9.5 g/dL — ABNORMAL LOW (ref 12.0–15.0)
Immature Granulocytes: 5 %
Lymphocytes Relative: 21 %
Lymphs Abs: 1.3 10*3/uL (ref 0.7–4.0)
MCH: 29.7 pg (ref 26.0–34.0)
MCHC: 32.2 g/dL (ref 30.0–36.0)
MCV: 92.2 fL (ref 80.0–100.0)
Monocytes Absolute: 1.1 10*3/uL — ABNORMAL HIGH (ref 0.1–1.0)
Monocytes Relative: 17 %
Neutro Abs: 3.3 10*3/uL (ref 1.7–7.7)
Neutrophils Relative %: 55 %
Platelets: 253 10*3/uL (ref 150–400)
RBC: 3.2 MIL/uL — ABNORMAL LOW (ref 3.87–5.11)
RDW: 14.2 % (ref 11.5–15.5)
WBC: 6.1 10*3/uL (ref 4.0–10.5)
nRBC: 0 % (ref 0.0–0.2)

## 2021-11-04 LAB — COMPREHENSIVE METABOLIC PANEL
ALT: 12 U/L (ref 0–44)
AST: 18 U/L (ref 15–41)
Albumin: 2.6 g/dL — ABNORMAL LOW (ref 3.5–5.0)
Alkaline Phosphatase: 88 U/L (ref 38–126)
Anion gap: 6 (ref 5–15)
BUN: <5 mg/dL — ABNORMAL LOW (ref 8–23)
CO2: 28 mmol/L (ref 22–32)
Calcium: 8.3 mg/dL — ABNORMAL LOW (ref 8.9–10.3)
Chloride: 107 mmol/L (ref 98–111)
Creatinine, Ser: 0.53 mg/dL (ref 0.44–1.00)
GFR, Estimated: >60 mL/min (ref >60)
Glucose, Bld: 107 mg/dL — ABNORMAL HIGH (ref 70–99)
Potassium: 3.3 mmol/L — ABNORMAL LOW (ref 3.5–5.1)
Sodium: 141 mmol/L (ref 135–145)
Total Bilirubin: 0.4 mg/dL (ref 0.3–1.2)
Total Protein: 5.5 g/dL — ABNORMAL LOW (ref 6.5–8.1)

## 2021-11-04 LAB — RETICULOCYTES
Immature Retic Fract: 33.3 % — ABNORMAL HIGH (ref 2.3–15.9)
RBC.: 3.23 MIL/uL — ABNORMAL LOW (ref 3.87–5.11)
Retic Count, Absolute: 53 10*3/uL (ref 19.0–186.0)
Retic Ct Pct: 1.6 % (ref 0.4–3.1)

## 2021-11-04 LAB — FERRITIN: Ferritin: 300 ng/mL (ref 11–307)

## 2021-11-04 LAB — IRON AND TIBC
Iron: 51 ug/dL (ref 28–170)
Saturation Ratios: 23 % (ref 10.4–31.8)
TIBC: 218 ug/dL — ABNORMAL LOW (ref 250–450)
UIBC: 167 ug/dL

## 2021-11-04 LAB — PHOSPHORUS: Phosphorus: 3.1 mg/dL (ref 2.5–4.6)

## 2021-11-04 LAB — MAGNESIUM: Magnesium: 2 mg/dL (ref 1.7–2.4)

## 2021-11-04 LAB — FOLATE: Folate: 13.7 ng/mL (ref >5.9)

## 2021-11-04 LAB — VITAMIN B12: Vitamin B-12: 442 pg/mL (ref 180–914)

## 2021-11-04 MED ORDER — ENALAPRIL MALEATE 5 MG PO TABS
20.0000 mg | ORAL_TABLET | Freq: Two times a day (BID) | ORAL | Status: DC
  Administered 2021-11-04 – 2021-11-08 (×8): 20 mg via ORAL
  Filled 2021-11-04 (×4): qty 4
  Filled 2021-11-04: qty 2
  Filled 2021-11-04: qty 4
  Filled 2021-11-04: qty 2
  Filled 2021-11-04 (×2): qty 4
  Filled 2021-11-04: qty 1

## 2021-11-04 MED ORDER — POTASSIUM CHLORIDE CRYS ER 20 MEQ PO TBCR
40.0000 meq | EXTENDED_RELEASE_TABLET | Freq: Two times a day (BID) | ORAL | Status: AC
Start: 2021-11-04 — End: 2021-11-04
  Administered 2021-11-04 (×2): 40 meq via ORAL
  Filled 2021-11-04 (×2): qty 2

## 2021-11-04 NOTE — Discharge Summary (Incomplete)
Physician Discharge Summary   Patient: [name] MRN: [id number] DOB: June 24, 1940  Admit date:     10/29/2021  Discharge date: 11/04/21  Discharge Physician: Marguerita Merles, DO   PCP: Geoffry Paradise, MD   Recommendations at discharge:  {Tip this will not be part of the note when signed- Example include specific recommendations for outpatient follow-up, pending tests to follow-up on. (Optional):26781}  ***  Discharge Diagnoses: Principal Problem:   Generalized weakness Active Problems:   Anemia   Sepsis (HCC)   Colitis   Lumbar stenosis with neurogenic claudication   Essential hypertension  Resolved Problems:   * No resolved hospital problems. [facility] Course: No notes on file  Assessment and Plan:Colitis WITH Sepsis and weakness -Following lumbar spinal surgery last month. -Will treat colitis, IV Rocephin 2 g daily and IV Flagyl 500 mg twice daily initiated on 10/30/2021-to complete a 5-day course but may need a longer course -C. difficile PCR and GI panel by PCR negative. -WBC went from 15.2 -> 14.8 -> 15.8 -> 11.3 -> 6.7 -Continue to Monitor fever curve and WBC. -Continue with loperamide 4 mg p.o. as needed for diarrhea or loose stools -Getting IV fluid hydration with normal saline we will stop today -PT/OT recommending home Health    T9 compression fracture acute -Dr. Lurene Shadow discussed with Robbie Lis neurosurgy for opinion in setting of recent T12/L1 -No surgical recommendations, will have the patient follow-up in the outpatient setting.   Presyncope -Obtain orthostatic vital signs -Fall precautions -PT OT to assess and recommending [facility]    Hypomagnesemia -Patient's mag level is 1.6 -Replete with IV mag sulfate 2 g -Continue to monitor and replete as necessary -Repeat magnesium level in the a.m.   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 -Patient's hemoglobin/hematocrit is relatively stable now at 9.4/29.1 -Check anemia panel in the a.m; -Continue monitor for signs and symptoms of bleeding; no overt bleeding noted -Repeat CMP   Lumbar stenosis with neurogenic claudication -Recent spinal fusion surgery at end of March -MRI of T and L spine T9 COMP FX AS ABOVE -[facility] NEUROSURGERY-AS ABOVE -Analgesics as needed with hydrocodone/acetaminophen 1 tab p.o. every 4 as needed for moderate pain and severe pain and also has fentanyl 25 mcg IV every 2 as needed severe pain -Also on meloxicam 50 mg p.o. daily and methocarbamol 500 g p.o. every 6 as needed for muscle spasms   Essential Hypertension -BP was not at goal, elevated yesterday but is now improved   -Restarted home oral antihypertensives with enalapril 20 mg p.o. twice daily -Continue monitor blood pressures per protocol -Last blood pressure reading was 131/63   Hypoalbuminemia -Albumin level is now 2.5 -Continue monitor and trend and repeat CMP in a.m.   Hyperlipidemia -Continue with pravastatin 20 mg p.o. nightly   Depression and Anxiety -Continue sertraline 50 mg p.o. daily   Dementia -Continue donepezil 5 mg p.o. nightly     {Tip this will not be part of the note when signed Body mass index is 28.35 kg/m. ,  Nutrition Documentation    Flowsheet Row ED to Hosp-Admission (Current) from 10/29/2021 in [place] 4TH FLOOR PROGRESSIVE CARE AND UROLOGY  Nutrition Problem Increased nutrient needs  Etiology acute illness  Nutrition Goal Patient will meet greater than or equal to 90% of their needs  Interventions Ensure Enlive (each supplement provides 350kcal and 20 grams of protein), MVI, Liberalize Diet, Refer to RD note for recommendations     ,  (Optional):26781}  {(NOTE) Pain control PDMP Statment  (Optional):26782} Consultants: *** Procedures performed: ***  Disposition: {Plan; Disposition:26390} Diet recommendation:  {Diet_Plan:26776} DISCHARGE MEDICATION: Allergies as of 11/04/2021       Reactions   Morphine Other (See Comments)   vomiting     Med Rec must be completed prior to using this [facility]***       Discharge Exam: Filed Weights   11/03/21 0500  Weight: 82.1 kg   ***  Condition at discharge: {DC Condition:26389}  The results of significant diagnostics from this hospitalization (including imaging, microbiology, ancillary and laboratory) are listed below for reference.   Imaging Studies: MR THORACIC SPINE WO CONTRAST  Result Date: 10/29/2021 CLINICAL DATA:  Concern for osteomyelitis EXAM: MRI THORACIC SPINE WITHOUT CONTRAST TECHNIQUE: Multiplanar, multisequence MR imaging of the thoracic spine was performed. No intravenous contrast was administered. COMPARISON:  MRI of the thoracic spine 08/01/2021 FINDINGS: Evaluation is somewhat limited by motion artifact. Alignment: S shaped curvature of the thoracolumbar spine. No listhesis. Vertebrae: Focally increased T2 signal at the inferior aspect of T9, with up to 20% vertebral body height loss from the inferior endplate (series 18, image 7), which is new since 08/27/2021. Interval extension of previously noted lumbar spinal hardware to the T10 level; susceptibility artifact from the hardware limits evaluation of these levels. Within this limitation, increased T2 signal is noted about the endplates of T12-L1 (series 17, image 6), which appears increased compared to the prior exam, likely edema at the endplates associated with the interval placement of an interbody disc spacer at this level. Cord: Evaluation of the spinal cord in the lower thoracic spine is significantly limited by susceptibility artifact. Within this limitation, the spinal cord is normal in caliber and signal. Paraspinal and other soft tissues: No acute  finding. Disc levels: Within the aforementioned limitations, no spinal canal stenosis or significant neural foraminal narrowing is seen, although the neural foramina at the postsurgical levels can not be well evaluated. IMPRESSION: 1. Acute appearing inferior endplate compression fracture at T9, with approximately 20% vertebral body height loss. 2. Interval extension of previously noted lumbar spinal hardware to the T10 level, with placement of an interbody disc spacer at T12-L1. Although evaluation of the levels is limited by susceptibility, there is increased T2 signal about the endplates at T12-L1, likely edema related to the previously noted degenerative changes in the disc spacer placement. Electronically Signed   By: Wiliam Ke M.D.   On: 10/29/2021 23:16   CT ABDOMEN PELVIS W CONTRAST  Result Date: 10/29/2021 CLINICAL DATA:  Abdominal pain, acute, nonlocalized EXAM: CT ABDOMEN AND PELVIS WITH CONTRAST TECHNIQUE: Multidetector CT imaging of the abdomen and pelvis was performed using the standard protocol following bolus administration of intravenous contrast. RADIATION DOSE REDUCTION: This exam was performed according to the departmental dose-optimization program which includes automated exposure control, adjustment of the mA and/or kV according to patient size and/or use of iterative reconstruction technique. CONTRAST:  OMNIPAQUE IOHEXOL 300 MG/ML  SOLN COMPARISON:  None Available. FINDINGS: Lower chest: No acute abnormality Hepatobiliary: No focal liver abnormality is seen. Status post cholecystectomy. No biliary dilatation. Pancreas: No focal abnormality or ductal dilatation. Spleen: No focal abnormality.  Normal size. Adrenals/Urinary Tract: No adrenal abnormality. No focal renal abnormality.  No stones or hydronephrosis. Urinary bladder is unremarkable. Stomach/Bowel: There is wall thickening surrounding the colon from the splenic flexure through the descending colon compatible with colitis.  No bowel obstruction. Stomach and small bowel decompressed, unremarkable. Vascular/Lymphatic: Aortic atherosclerosis. No evidence of aneurysm or adenopathy. Reproductive: Uterus and adnexa unremarkable.  No mass. Other: No free fluid or free air. Musculoskeletal: No acute bony abnormality. Posterior fusion changes in the thoracolumbar spine. IMPRESSION: Wall thickening involving the descending colon compatible with colitis. Electronically Signed   By: Charlett Nose M.D.   On: 10/29/2021 20:17   DG Chest Port 1 View  Result Date: 10/29/2021 CLINICAL DATA:  Near syncope. EXAM: PORTABLE CHEST 1 VIEW COMPARISON:  Two-view chest x-ray 02/09/2010 FINDINGS: Heart size exaggerated by low lung volumes. Atherosclerotic changes are present at the aortic arch. Pulmonary vascular congestion is present without frank edema. No effusions are present. No focal airspace consolidation is present. IMPRESSION: 1. Low lung volumes. 2. Pulmonary vascular congestion without frank edema. Electronically Signed   By: Marin Roberts M.D.   On: 10/29/2021 13:01    Microbiology: Results for orders placed or performed during the hospital encounter of 10/29/21  Culture, blood (single)     Status: None   Collection Time: 10/29/21  5:06 PM   Specimen: BLOOD  Result Value Ref Range Status   Specimen Description   Final    BLOOD LEFT ANTECUBITAL Performed at [facility], [address] [place], [place] [address]    Special Requests   Final    BOTTLES DRAWN AEROBIC AND ANAEROBIC Blood Culture adequate volume Performed at [facility], [address] [place], [place] [address]    Culture   Final    NO GROWTH 5 DAYS Performed at [facility], [address] [place], [place] [address]    Report Status 11/03/2021 FINAL  Final  C Difficile Quick Screen w PCR reflex     Status: None   Collection Time: 10/31/21  9:35 AM   Specimen: Stool  Result Value Ref Range Status   C Diff  antigen NEGATIVE NEGATIVE Final   C Diff toxin NEGATIVE NEGATIVE Final   C Diff interpretation No C. difficile detected.  Final    Comment: Performed at [facility], [address] [place], [place] [address]  Gastrointestinal Panel by PCR , Stool     Status: None   Collection Time: 10/31/21  9:35 AM   Specimen: Stool  Result Value Ref Range Status   Campylobacter species NOT DETECTED NOT DETECTED Final   Plesimonas shigelloides NOT DETECTED NOT DETECTED Final   Salmonella species NOT DETECTED NOT DETECTED Final   Yersinia enterocolitica NOT DETECTED NOT DETECTED Final   Vibrio species NOT DETECTED NOT DETECTED Final   Vibrio cholerae NOT DETECTED NOT DETECTED Final   Enteroaggregative E coli (EAEC) NOT DETECTED NOT DETECTED Final   Enteropathogenic E coli (EPEC) NOT DETECTED NOT DETECTED Final   Enterotoxigenic E coli (ETEC) NOT DETECTED NOT DETECTED Final   Shiga like toxin producing E coli (STEC) NOT DETECTED NOT DETECTED Final   Shigella/Enteroinvasive E coli (EIEC) NOT DETECTED NOT DETECTED Final   Cryptosporidium NOT DETECTED NOT DETECTED Final   Cyclospora cayetanensis NOT DETECTED NOT DETECTED Final   Entamoeba histolytica NOT DETECTED NOT DETECTED Final   Giardia lamblia NOT DETECTED NOT DETECTED Final   Adenovirus F40/41 NOT DETECTED NOT DETECTED Final   Astrovirus NOT DETECTED NOT DETECTED Final   Norovirus GI/GII NOT  DETECTED NOT DETECTED Final   Rotavirus A NOT DETECTED NOT DETECTED Final   Sapovirus (I, II, IV, and V) NOT DETECTED NOT DETECTED Final    Comment: Performed at [facility], [address] [place], [place] [address]    Labs: CBC: Recent Labs  Lab 10/31/21 [id number] 11/01/21 0323 11/02/21 0329 11/03/21 0826 11/04/21 0410  WBC 14.8* 15.8* 11.3* 6.7 6.1  NEUTROABS 12.6* 13.3* 8.8* 4.3 3.3  HGB 8.9* 8.8* 9.8* 9.4* 9.5*  HCT 28.7* 26.8* 30.5* 29.1* 29.5*  MCV 97.6 94.0 93.8 91.8 92.2  PLT 177 214 231 240 253   Basic  Metabolic Panel: Recent Labs  Lab 10/31/21 0339 11/01/21 0323 11/02/21 0329 11/03/21 0826 11/04/21 0410  NA 135 141 141 140 141  K 3.4* 3.1* 3.0* 3.2* 3.3*  CL 107 112* 109 104 107  CO2 20* 23 23 26 28   GLUCOSE 91 84 81 93 107*  BUN 13 9 6* <5* <5*  CREATININE 0.76 0.59 0.56 0.45 0.53  CALCIUM 8.1* 8.3* 8.5* 8.2* 8.3*  MG  --   --  1.7 1.6* 2.0  PHOS  --   --   --  3.1 3.1   Liver Function Tests: Recent Labs  Lab 10/29/21 1244 11/03/21 0826 11/04/21 0410  AST 20 14* 18  ALT 12 10 12   ALKPHOS 113 90 88  BILITOT 0.5 0.6 0.4  PROT 6.6 5.6* 5.5*  ALBUMIN 3.4* 2.5* 2.6*   CBG: No results for input(s): GLUCAP in the last 168 hours.  Discharge time spent: {LESS THAN/GREATER WUJW:11914}THAN:26388} 30 minutes.  Signed: Merlene Laughtermair Latif Alexsis Branscom, DO [facility] 11/04/2021

## 2021-11-04 NOTE — Progress Notes (Signed)
       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  Spoke with Dr. Danielle Dess with neurosurgery who came to see patient tonight, after having the opportunity to discuss with him [name] is now amenable to transfer to [facility] for kyphoplasty tomorrow.  Dr. Danielle Dess is able to take [name] to the OR tomorrow afternoon tentatively around 4 PM, [facility] will arrange transfer to [facility] tonight or early tomorrow morning.  Bishop Limbo DNP, MHA, FNP-BC Nurse Practitioner [facility] Pager [phone]

## 2021-11-04 NOTE — TOC Progression Note (Addendum)
Transition of Care [facility]) - Progression Note    Patient Details  Name: [name] MRN: [id number] Date of Birth: March 28, 1941  Transition of Care [facility]) CM/SW Contact  Golda Acre, RN Phone Number: 11/04/2021, 11:59 AM  Clinical Narrative:    Spoke with patient she would like to go to [facility].  Tct-Kelly Norris patient can come tomorrow am.  Md notified.   Expected Discharge Plan: Home/Self Care Barriers to Discharge: Continued Medical Work up  Expected Discharge Plan and Services Expected Discharge Plan: Home/Self Care   Discharge Planning Services: CM Consult   Living arrangements for the past 2 months: Skilled Nursing Facility (dcd x1 [facility] before that was at [facility] for rehab)                                       Social Determinants of Health (SDOH) Interventions    Readmission Risk Interventions     View : No data to display.

## 2021-11-04 NOTE — Progress Notes (Signed)
PROGRESS NOTE    [name]  RUE:[id number] DOB: 08/16/1940 DOA: 10/29/2021 PCP: Geoffry ParadiseAronson, Richard, MD   Brief Narrative:  The patient is an 81 year old Caucasian female with a past medical history significant for but not limited to hypertension, history of lumbar radiculopathy status post fusion surgery of T12-L1 performed on 09/28/2021 with other comorbidities who presented to the [facility] ED with complaints of generalized weakness.  Since her surgery she has had ongoing back pain and generalized weakness and prior to admission she had near syncope at home.  MRI of lumbar spine showed a new T9 compression fracture with 20% height loss and Dr. Lurene ShadowSpongberg discussed with the on-call neurosurgical team who recommended no surgical intervention at this time and recommended managing this conservatively.  Patient is also had complaints of abdominal pain for which a CT scan was done and showed colitis.  C. difficile PCR and GI pathogen panel negative.  Patient has been initiated on IV antibiotics with ceftriaxone and IV Flagyl on 10/30/2021.  Patient continues to have back pain and 8 out of 10 is worse with movement and yesterday she continued to experience some diarrhea had some intermittent nausea but no vomiting.  She refused orthostatic vital signs yesterday morning as she did not have a back brace and PT OT still recommend home health services   Again she is found to have a hypokalemia and WBC is now trending down is resolved.  We will go back [facility] and she wanted me to discuss with her primary neurosurgeon about what type of rehab she should have and Dr. Danielle DessElsner will come see the patient this evening. Patient's diarrhea has improved but she continues to still have some pain when ambulating.  Assessment and Plan:  Colitis WITH Sepsis and Generalized weakness -Following lumbar spinal surgery last month. -Will treat colitis, IV Rocephin 2 g daily and IV Flagyl 500 mg twice daily initiated  on 10/30/2021-to complete a 5-day course but may need a longer course -C. difficile PCR and GI panel by PCR negative. -WBC went from 15.2 -> 14.8 -> 15.8 -> 11.3 -> 6.7 -> 6.1 -Continue to Monitor fever curve and WBC. -Continue with loperamide 4 mg p.o. as needed for diarrhea or loose stools -Getting IV fluid hydration with normal saline we will stop today -PT/OT recommending home Health initially but now will go back [facility]   T9 compression fracture acute -Dr. Lurene ShadowSpongberg discussed with Robbie Liscarolina neurosurgy for opinion in setting of recent T12/L1 -No surgical recommendations, will have the patient follow-up in the outpatient setting but I let Dr. Danielle DessElsner know the patient is hospitalized and he will come see her later this evening    Presyncope -Obtain orthostatic vital signs -Fall precautions -PT OT to assess and recommending Home Health initially but now recommending SNF   Hypomagnesemia -Patient's mag level is now 2.0 -Continue to monitor and replete as necessary -Repeat magnesium level in the a.m.   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 -Patient's hemoglobin/hematocrit is relatively stable now at 9.5/29.5 -Checking Anemia Panel and showed an iron level of 51, UIBC 167, TIBC of 218, saturation ratios of 23%, ferritin level 300, folate level 13.7, vitamin B12 442 -Continue monitor for  signs and symptoms of bleeding; no overt bleeding noted -Repeat CBC within a.m.   Lumbar stenosis with neurogenic claudication -Recent spinal fusion surgery at end of March -MRI of T and L spine T9 COMP FX AS ABOVE -[place] [facility]-AS ABOVE -Analgesics as needed with hydrocodone/acetaminophen 1 tab p.o. every 4 as needed for  moderate pain and severe pain and also has fentanyl 25 mcg IV every 2 as needed severe pain -Also on meloxicam 50 mg p.o. daily and methocarbamol 500 g p.o. every 6 as needed for muscle spasms   Essential Hypertension -BP was not at goal, elevated yesterday but is now improved   -Restarted home oral antihypertensives with enalapril 20 mg p.o. twice daily -Continue monitor blood pressures per protocol -Last blood pressure reading was 147/70   Hypoalbuminemia -Albumin level is now 2.5 yesterday and today is two-point -Continue monitor and trend and repeat CMP in a.m.   Hyperlipidemia -Continue with Pravastatin 20 mg p.o. nightly   Depression and Anxiety -Continue Sertraline 50 mg p.o. daily   Dementia -Continue Donepezil 5 mg p.o. nightly  DVT prophylaxis: SCDs Start: 10/29/21 2027    Code Status: Full Code Family Communication: No family currently at bedside but spoke with Loura Halt over the telephone  Disposition Plan:  Level of care: Progressive Status is: Inpatient Remains inpatient appropriate because: He is to go back to SNF and needs insurance authorization and bed acceptance and can be accepted back tomorrow on 11/05/2021   Consultants:  Dr. Lurene Shadow discussed with neurosurgery; I spoke with Dr. Danielle Dess and he will come by to see the patient   Procedures:  None  Antimicrobials:  Anti-infectives (From admission, onward)    Start     Dose/Rate Route Frequency Ordered Stop   10/30/21 0730  metroNIDAZOLE (FLAGYL) IVPB 500 mg        500 mg 100 mL/hr over 60 Minutes Intravenous Every 12 hours 10/29/21 2037     10/30/21 0500  cefTRIAXone (ROCEPHIN) 2 g in sodium chloride 0.9 % 100 mL IVPB        2 g 200 mL/hr over 30 Minutes Intravenous Every 24 hours 10/29/21 2037     10/29/21 1700  ceFEPIme (MAXIPIME) 2 g in sodium chloride 0.9 % 100 mL IVPB        2 g 200 mL/hr over 30 Minutes Intravenous  Once 10/29/21 1646 10/29/21 1743   10/29/21 1700  metroNIDAZOLE (FLAGYL)  IVPB 500 mg        500 mg 100 mL/hr over 60 Minutes Intravenous  Once 10/29/21 1646 10/29/21 2023   10/29/21 1700  vancomycin (VANCOCIN) IVPB 1000 mg/200 mL premix  Status:  Discontinued        1,000 mg 200 mL/hr over 60 Minutes Intravenous  Once 10/29/21 1646 10/29/21 1649   10/29/21 1700  vancomycin (VANCOREADY) IVPB 1500 mg/300 mL        1,500 mg 150 mL/hr over 120 Minutes Intravenous  Once 10/29/21 1649 10/29/21 2131       Subjective: Seen and examined at bedside and the patient was sitting in chair at bedside.  States that she has pain on ambulation but states that when she is sitting it is okay.  Denies any nausea or vomiting.  States that she has had 1 bowel movement this morning and is improving.  Denies any lightheadedness or dizziness.  No other concerns or complaints at this time.  Objective: Vitals:   11/03/21 2138 11/03/21 2328 11/04/21 0454 11/04/21 0549  BP: (!) 168/77 Marland Kitchen)  160/74  (!) 147/70  Pulse: 77   70  Resp: 20   18  Temp: 98.2 F (36.8 C)   98.2 F (36.8 C)  TempSrc: Oral   Oral  SpO2: 97%   91%  Weight:      Height:   5\' 7"  (1.702 m)     Intake/Output Summary (Last 24 hours) at 11/04/2021 01/04/2022 Last data filed at 11/04/2021 01/04/2022 Gross per 24 hour  Intake 2507.63 ml  Output 1850 ml  Net 657.63 ml   Filed Weights   11/03/21 0500  Weight: 82.1 kg   Examination: Physical Exam:  Constitutional: WN/WD overweight elderly Caucasian female currently no acute distress appears calm sitting in the chair at bedside Respiratory: Clear to auscultation bilaterally, no wheezing, rales, rhonchi or crackles. Normal respiratory effort and patient is not tachypenic. No accessory muscle use.  Cardiovascular: RRR, no murmurs / rubs / gallops. S1 and S2 auscultated. No extremity edema. 2+ pedal pulses. No carotid bruits.  Abdomen: Soft, non-tender, Distended 2/2 body habitus. Bowel sounds positive.  GU: Deferred. Skin: No rashes, lesions, ulcers on a limited skin  evaluation. No induration; Warm and dry.  Neurologic: CN 2-12 grossly intact with no focal deficits. Romberg sign and cerebellar reflexes not assessed.  Psychiatric: Normal judgment and insight. Alert and oriented x 3. Normal mood and appropriate affect.   Data Reviewed: I have personally reviewed following labs and imaging studies  CBC: Recent Labs  Lab 10/31/21 0339 11/01/21 0323 11/02/21 0329 11/03/21 0826 11/04/21 0410  WBC 14.8* 15.8* 11.3* 6.7 6.1  NEUTROABS 12.6* 13.3* 8.8* 4.3 3.3  HGB 8.9* 8.8* 9.8* 9.4* 9.5*  HCT 28.7* 26.8* 30.5* 29.1* 29.5*  MCV 97.6 94.0 93.8 91.8 92.2  PLT 177 214 231 240 253   Basic Metabolic Panel: Recent Labs  Lab 10/31/21 0339 11/01/21 0323 11/02/21 0329 11/03/21 0826 11/04/21 0410  NA 135 141 141 140 141  K 3.4* 3.1* 3.0* 3.2* 3.3*  CL 107 112* 109 104 107  CO2 20* 23 23 26 28   GLUCOSE 91 84 81 93 107*  BUN 13 9 6* <5* <5*  CREATININE 0.76 0.59 0.56 0.45 0.53  CALCIUM 8.1* 8.3* 8.5* 8.2* 8.3*  MG  --   --  1.7 1.6* 2.0  PHOS  --   --   --  3.1 3.1   GFR: Estimated Creatinine Clearance: 61.8 mL/min (by C-G formula based on SCr of 0.53 mg/dL). Liver Function Tests: Recent Labs  Lab 10/29/21 1244 11/03/21 0826 11/04/21 0410  AST 20 14* 18  ALT 12 10 12   ALKPHOS 113 90 88  BILITOT 0.5 0.6 0.4  PROT 6.6 5.6* 5.5*  ALBUMIN 3.4* 2.5* 2.6*   No results for input(s): LIPASE, AMYLASE in the last 168 hours. No results for input(s): AMMONIA in the last 168 hours. Coagulation Profile: No results for input(s): INR, PROTIME in the last 168 hours. Cardiac Enzymes: No results for input(s): CKTOTAL, CKMB, CKMBINDEX, TROPONINI in the last 168 hours. BNP (last 3 results) No results for input(s): PROBNP in the last 8760 hours. HbA1C: No results for input(s): HGBA1C in the last 72 hours. CBG: No results for input(s): GLUCAP in the last 168 hours. Lipid Profile: No results for input(s): CHOL, HDL, LDLCALC, TRIG, CHOLHDL, LDLDIRECT in  the last 72 hours. Thyroid Function Tests: No results for input(s): TSH, T4TOTAL, FREET4, T3FREE, THYROIDAB in the last 72 hours. Anemia Panel: Recent Labs    11/04/21 0410  VITAMINB12 442  FOLATE 13.7  FERRITIN 300  TIBC 218*  IRON 51  RETICCTPCT 1.6   Sepsis Labs: Recent Labs  Lab 10/29/21 1244 10/29/21 1405 10/29/21 1706 10/30/21 0331 10/31/21 0339  PROCALCITON <0.10  --   --  0.58 0.37  LATICACIDVEN  --  1.2 1.6  --   --     Recent Results (from the past 240 hour(s))  Culture, blood (single)     Status: None   Collection Time: 10/29/21  5:06 PM   Specimen: BLOOD  Result Value Ref Range Status   Specimen Description   Final    BLOOD LEFT ANTECUBITAL Performed at [facility], [address] [place], [place] [address]    Special Requests   Final    BOTTLES DRAWN AEROBIC AND ANAEROBIC Blood Culture adequate volume Performed at [facility], [address] [place], [place] [address]    Culture   Final    NO GROWTH 5 DAYS Performed at [facility], [address] [place], [place] [address]    Report Status 11/03/2021 FINAL  Final  C Difficile Quick Screen w PCR reflex     Status: None   Collection Time: 10/31/21  9:35 AM   Specimen: Stool  Result Value Ref Range Status   C Diff antigen NEGATIVE NEGATIVE Final   C Diff toxin NEGATIVE NEGATIVE Final   C Diff interpretation No C. difficile detected.  Final    Comment: Performed at [facility], [address] [place], [place] [address]  Gastrointestinal Panel by PCR , Stool     Status: None   Collection Time: 10/31/21  9:35 AM   Specimen: Stool  Result Value Ref Range Status   Campylobacter species NOT DETECTED NOT DETECTED Final   Plesimonas shigelloides NOT DETECTED NOT DETECTED Final   Salmonella species NOT DETECTED NOT DETECTED Final   Yersinia enterocolitica NOT DETECTED NOT DETECTED Final   Vibrio species NOT DETECTED NOT DETECTED Final    Vibrio cholerae NOT DETECTED NOT DETECTED Final   Enteroaggregative E coli (EAEC) NOT DETECTED NOT DETECTED Final   Enteropathogenic E coli (EPEC) NOT DETECTED NOT DETECTED Final   Enterotoxigenic E coli (ETEC) NOT DETECTED NOT DETECTED Final   Shiga like toxin producing E coli (STEC) NOT DETECTED NOT DETECTED Final   Shigella/Enteroinvasive E coli (EIEC) NOT DETECTED NOT DETECTED Final   Cryptosporidium NOT DETECTED NOT DETECTED Final   Cyclospora cayetanensis NOT DETECTED NOT DETECTED Final   Entamoeba histolytica NOT DETECTED NOT DETECTED Final   Giardia lamblia NOT DETECTED NOT DETECTED Final   Adenovirus F40/41 NOT DETECTED NOT DETECTED Final   Astrovirus NOT DETECTED NOT DETECTED Final   Norovirus GI/GII NOT DETECTED NOT DETECTED Final   Rotavirus A NOT DETECTED NOT DETECTED Final   Sapovirus (I, II, IV, and V) NOT DETECTED NOT DETECTED Final    Comment: Performed at [facility], [address] [place], [place] [address]    Radiology Studies: No results found.  Scheduled Meds:  amLODipine  5 mg Oral QHS   donepezil  5 mg Oral QHS   enalapril  20 mg Oral BID   feeding supplement  237 mL Oral BID BM   meloxicam  15 mg Oral Daily   multivitamin with minerals  1 tablet Oral Daily   potassium chloride  40 mEq Oral BID   pravastatin  20 mg Oral QHS   progesterone  100 mg Oral QHS   sertraline  50 mg Oral Daily  Continuous Infusions:  cefTRIAXone (ROCEPHIN)  IV 2 g (11/04/21 0510)   metronidazole 500 mg (11/04/21 0630)    LOS: 5 days   Marguerita Merles, DO [facility] Available via Epic secure chat 7am-7pm After these hours, please refer to coverage provider listed on [email or web address] 11/04/2021, 8:29 AM

## 2021-11-04 NOTE — Progress Notes (Signed)
Patient ID: TAURUS WILLIS, female   DOB: 05/12/1941, 81 y.o.   MRN: [id number] I spoke with Ms. Micah Galeno today to discuss the fact that she has a T9 compression fracture above her arthrodesis which extends from T10 to the sacrum.  This likely is exceptionally painful as it is the first mobile segment above a rather extensive fusion.  I discussed the fact that some of her chest wall pain likely reflects some radicular irritability and this could be well helped by an acrylic balloon kyphoplasty.  Having explained the rationale for this process she is agreeable to proceeding with an acrylic balloon kyphoplasty.  I noted that I could do this likely tomorrow afternoon at [facility].  Would likely occur after 4:00 in the afternoon as I have a full clinic tomorrow.  I was spoken with the hospitalist on-call regarding transferring her over to [facility] and we will plan surgical intervention tomorrow afternoon.

## 2021-11-04 NOTE — Progress Notes (Signed)
Patient ID: [name], female   DOB: [birth year], 81 y.o.   MRN: [id number] In light of persistent pain and poor level of function Acrylic kyphoplasty would help to decrease pain. Could we transfer her to [facility]?

## 2021-11-04 NOTE — Progress Notes (Signed)
Patient denied feeling lightheaded or dizzy sitting and standing.  Patient did c/o severe (chronic) pain in back when sitting and standing.   11/04/21 1219  Orthostatic Lying   Pulse- Lying 81  BP- Lying 132/60  Orthostatic Sitting  Pulse- Sitting 93  BP- Sitting 146/73  Orthostatic Standing at 0 minutes  Pulse- Standing at 0 minutes 98  BP- Standing at 0 minutes 129/65  Orthostatic Standing at 3 minutes  BP- Standing at 3 minutes 148/67  Pulse- Standing at 3 minutes 100   Bradd Burner, RN

## 2021-11-05 ENCOUNTER — Encounter (HOSPITAL_COMMUNITY)
Admission: EM | Disposition: A | Discharge status: Skilled Nursing Facility | Payer: Self-pay | Source: Home / Self Care | Attending: Internal Medicine

## 2021-11-05 ENCOUNTER — Inpatient Hospital Stay (HOSPITAL_COMMUNITY): Payer: Medicare Other

## 2021-11-05 ENCOUNTER — Inpatient Hospital Stay (HOSPITAL_COMMUNITY): Payer: Medicare Other | Admitting: Anesthesiology

## 2021-11-05 ENCOUNTER — Encounter (HOSPITAL_COMMUNITY): Payer: Self-pay | Admitting: Internal Medicine

## 2021-11-05 ENCOUNTER — Other Ambulatory Visit: Payer: Self-pay

## 2021-11-05 DIAGNOSIS — M8088XA Other osteoporosis with current pathological fracture, vertebra(e), initial encounter for fracture: Secondary | ICD-10-CM

## 2021-11-05 HISTORY — PX: KYPHOPLASTY: SHX5884

## 2021-11-05 LAB — CBC WITH DIFFERENTIAL/PLATELET
Abs Immature Granulocytes: 0.25 10*3/uL — ABNORMAL HIGH (ref 0.00–0.07)
Basophils Absolute: 0.1 10*3/uL (ref 0.0–0.1)
Basophils Relative: 1 %
Eosinophils Absolute: 0 10*3/uL (ref 0.0–0.5)
Eosinophils Relative: 0 %
HCT: 29.1 % — ABNORMAL LOW (ref 36.0–46.0)
Hemoglobin: 9.3 g/dL — ABNORMAL LOW (ref 12.0–15.0)
Immature Granulocytes: 5 %
Lymphocytes Relative: 26 %
Lymphs Abs: 1.4 10*3/uL (ref 0.7–4.0)
MCH: 29.5 pg (ref 26.0–34.0)
MCHC: 32 g/dL (ref 30.0–36.0)
MCV: 92.4 fL (ref 80.0–100.0)
Monocytes Absolute: 1.1 10*3/uL — ABNORMAL HIGH (ref 0.1–1.0)
Monocytes Relative: 20 %
Neutro Abs: 2.6 10*3/uL (ref 1.7–7.7)
Neutrophils Relative %: 48 %
Platelets: 258 10*3/uL (ref 150–400)
RBC: 3.15 MIL/uL — ABNORMAL LOW (ref 3.87–5.11)
RDW: 14.6 % (ref 11.5–15.5)
WBC: 5.5 10*3/uL (ref 4.0–10.5)
nRBC: 0 % (ref 0.0–0.2)

## 2021-11-05 LAB — COMPREHENSIVE METABOLIC PANEL
ALT: 11 U/L (ref 0–44)
AST: 22 U/L (ref 15–41)
Albumin: 2.4 g/dL — ABNORMAL LOW (ref 3.5–5.0)
Alkaline Phosphatase: 80 U/L (ref 38–126)
Anion gap: 7 (ref 5–15)
BUN: 7 mg/dL — ABNORMAL LOW (ref 8–23)
CO2: 25 mmol/L (ref 22–32)
Calcium: 8.3 mg/dL — ABNORMAL LOW (ref 8.9–10.3)
Chloride: 108 mmol/L (ref 98–111)
Creatinine, Ser: 0.49 mg/dL (ref 0.44–1.00)
GFR, Estimated: >60 mL/min (ref >60)
Glucose, Bld: 99 mg/dL (ref 70–99)
Potassium: 3.5 mmol/L (ref 3.5–5.1)
Sodium: 140 mmol/L (ref 135–145)
Total Bilirubin: 0.3 mg/dL (ref 0.3–1.2)
Total Protein: 5.2 g/dL — ABNORMAL LOW (ref 6.5–8.1)

## 2021-11-05 LAB — MAGNESIUM: Magnesium: 1.9 mg/dL (ref 1.7–2.4)

## 2021-11-05 LAB — PHOSPHORUS: Phosphorus: 3.4 mg/dL (ref 2.5–4.6)

## 2021-11-05 LAB — GLUCOSE, CAPILLARY: Glucose-Capillary: 105 mg/dL — ABNORMAL HIGH (ref 70–99)

## 2021-11-05 SURGERY — KYPHOPLASTY
Anesthesia: General | Site: Back

## 2021-11-05 MED ORDER — ROCURONIUM BROMIDE 10 MG/ML (PF) SYRINGE
PREFILLED_SYRINGE | INTRAVENOUS | Status: DC | PRN
  Administered 2021-11-05: 50 mg via INTRAVENOUS

## 2021-11-05 MED ORDER — 0.9 % SODIUM CHLORIDE (POUR BTL) OPTIME
TOPICAL | Status: DC | PRN
  Administered 2021-11-05: 1000 mL

## 2021-11-05 MED ORDER — ORAL CARE MOUTH RINSE: 15.0000 mL | Freq: Once | OROMUCOSAL | Status: AC

## 2021-11-05 MED ORDER — LIDOCAINE-EPINEPHRINE 1 %-1:100000 IJ SOLN
INTRAMUSCULAR | Status: AC
  Filled 2021-11-05: qty 1

## 2021-11-05 MED ORDER — BISACODYL 10 MG RE SUPP: 10.0000 mg | Freq: Every day | RECTAL | Status: DC | PRN

## 2021-11-05 MED ORDER — CHLORHEXIDINE GLUCONATE 0.12 % MT SOLN: 15.0000 mL | Freq: Once | OROMUCOSAL | Status: AC

## 2021-11-05 MED ORDER — BUPIVACAINE HCL (PF) 0.5 % IJ SOLN
INTRAMUSCULAR | Status: AC
  Filled 2021-11-05: qty 30

## 2021-11-05 MED ORDER — OXYCODONE HCL 5 MG/5ML PO SOLN
ORAL | Status: AC
  Filled 2021-11-05: qty 5

## 2021-11-05 MED ORDER — FENTANYL CITRATE (PF) 100 MCG/2ML IJ SOLN
INTRAMUSCULAR | Status: DC | PRN
  Administered 2021-11-05: 50 ug via INTRAVENOUS

## 2021-11-05 MED ORDER — ONDANSETRON HCL 4 MG PO TABS: 4.0000 mg | ORAL_TABLET | Freq: Four times a day (QID) | ORAL | Status: DC | PRN

## 2021-11-05 MED ORDER — SODIUM CHLORIDE 0.9% FLUSH
3.0000 mL | Freq: Two times a day (BID) | INTRAVENOUS | Status: DC
  Administered 2021-11-05 – 2021-11-08 (×6): 3 mL via INTRAVENOUS

## 2021-11-05 MED ORDER — POTASSIUM CHLORIDE CRYS ER 20 MEQ PO TBCR
40.0000 meq | EXTENDED_RELEASE_TABLET | Freq: Once | ORAL | Status: AC
Start: 2021-11-05 — End: 2021-11-05
  Administered 2021-11-05: 40 meq via ORAL
  Filled 2021-11-05: qty 2

## 2021-11-05 MED ORDER — PHENOL 1.4 % MT LIQD: 1.0000 | OROMUCOSAL | Status: DC | PRN

## 2021-11-05 MED ORDER — ACETAMINOPHEN 10 MG/ML IV SOLN: 1000.0000 mg | Freq: Once | INTRAVENOUS | Status: DC

## 2021-11-05 MED ORDER — CEFAZOLIN SODIUM-DEXTROSE 2-4 GM/100ML-% IV SOLN
INTRAVENOUS | Status: AC
  Filled 2021-11-05: qty 100

## 2021-11-05 MED ORDER — BUPIVACAINE HCL (PF) 0.5 % IJ SOLN
INTRAMUSCULAR | Status: DC | PRN
  Administered 2021-11-05: 20 mL

## 2021-11-05 MED ORDER — OXYCODONE HCL 5 MG PO TABS: 5.0000 mg | ORAL_TABLET | Freq: Once | ORAL | Status: AC | PRN

## 2021-11-05 MED ORDER — SENNA 8.6 MG PO TABS
1.0000 | ORAL_TABLET | Freq: Two times a day (BID) | ORAL | Status: DC
  Administered 2021-11-06 (×2): 8.6 mg via ORAL
  Filled 2021-11-05 (×4): qty 1

## 2021-11-05 MED ORDER — ROCURONIUM BROMIDE 10 MG/ML (PF) SYRINGE
PREFILLED_SYRINGE | INTRAVENOUS | Status: AC
  Filled 2021-11-05: qty 10

## 2021-11-05 MED ORDER — IOPAMIDOL (ISOVUE-300) INJECTION 61%
INTRAVENOUS | Status: DC | PRN
  Administered 2021-11-05: 100 mL

## 2021-11-05 MED ORDER — PROPOFOL 10 MG/ML IV BOLUS
INTRAVENOUS | Status: DC | PRN
  Administered 2021-11-05: 100 mg via INTRAVENOUS

## 2021-11-05 MED ORDER — MIDAZOLAM HCL 2 MG/2ML IJ SOLN
INTRAMUSCULAR | Status: DC | PRN
  Administered 2021-11-05: 1 mg via INTRAVENOUS

## 2021-11-05 MED ORDER — ONDANSETRON HCL 4 MG/2ML IJ SOLN
INTRAMUSCULAR | Status: DC | PRN
  Administered 2021-11-05: 4 mg via INTRAVENOUS

## 2021-11-05 MED ORDER — ONDANSETRON HCL 4 MG/2ML IJ SOLN
4.0000 mg | Freq: Once | INTRAMUSCULAR | Status: DC | PRN
Start: 2021-11-05 — End: 2021-11-05

## 2021-11-05 MED ORDER — ACETAMINOPHEN 650 MG RE SUPP: 650.0000 mg | RECTAL | Status: DC | PRN

## 2021-11-05 MED ORDER — POLYETHYLENE GLYCOL 3350 17 G PO PACK: 17.0000 g | PACK | Freq: Every day | ORAL | Status: DC | PRN

## 2021-11-05 MED ORDER — ACETAMINOPHEN 325 MG PO TABS
650.0000 mg | ORAL_TABLET | ORAL | Status: DC | PRN
  Administered 2021-11-07: 650 mg via ORAL
  Filled 2021-11-05 (×2): qty 2

## 2021-11-05 MED ORDER — SODIUM CHLORIDE 0.9 % IV SOLN: 250.0000 mL | INTRAVENOUS | Status: DC

## 2021-11-05 MED ORDER — DEXAMETHASONE SODIUM PHOSPHATE 10 MG/ML IJ SOLN
INTRAMUSCULAR | Status: DC | PRN
  Administered 2021-11-05: 5 mg via INTRAVENOUS

## 2021-11-05 MED ORDER — CHLORHEXIDINE GLUCONATE 0.12 % MT SOLN
OROMUCOSAL | Status: AC
  Administered 2021-11-05: 15 mL via OROMUCOSAL
  Filled 2021-11-05: qty 15

## 2021-11-05 MED ORDER — LIDOCAINE 2% (20 MG/ML) 5 ML SYRINGE
INTRAMUSCULAR | Status: AC
  Filled 2021-11-05: qty 5

## 2021-11-05 MED ORDER — PROPOFOL 10 MG/ML IV BOLUS
INTRAVENOUS | Status: AC
  Filled 2021-11-05: qty 20

## 2021-11-05 MED ORDER — FENTANYL CITRATE (PF) 100 MCG/2ML IJ SOLN
INTRAMUSCULAR | Status: AC
  Filled 2021-11-05: qty 2

## 2021-11-05 MED ORDER — ONDANSETRON HCL 4 MG/2ML IJ SOLN: 4.0000 mg | Freq: Four times a day (QID) | INTRAMUSCULAR | Status: DC | PRN

## 2021-11-05 MED ORDER — MENTHOL 3 MG MT LOZG: 1.0000 | LOZENGE | OROMUCOSAL | Status: DC | PRN

## 2021-11-05 MED ORDER — FENTANYL CITRATE (PF) 100 MCG/2ML IJ SOLN
25.0000 ug | INTRAMUSCULAR | Status: DC | PRN
  Administered 2021-11-05 (×2): 25 ug via INTRAVENOUS
  Administered 2021-11-05: 50 ug via INTRAVENOUS

## 2021-11-05 MED ORDER — ACETAMINOPHEN 10 MG/ML IV SOLN
INTRAVENOUS | Status: AC
  Filled 2021-11-05: qty 100

## 2021-11-05 MED ORDER — LIDOCAINE 2% (20 MG/ML) 5 ML SYRINGE
INTRAMUSCULAR | Status: DC | PRN
  Administered 2021-11-05: 60 mg via INTRAVENOUS

## 2021-11-05 MED ORDER — ALUM & MAG HYDROXIDE-SIMETH 200-200-20 MG/5ML PO SUSP: 30.0000 mL | Freq: Four times a day (QID) | ORAL | Status: DC | PRN

## 2021-11-05 MED ORDER — MIDAZOLAM HCL 2 MG/2ML IJ SOLN
INTRAMUSCULAR | Status: AC
  Filled 2021-11-05: qty 2

## 2021-11-05 MED ORDER — CEFAZOLIN SODIUM-DEXTROSE 2-3 GM-%(50ML) IV SOLR
INTRAVENOUS | Status: DC | PRN
  Administered 2021-11-05: 2 g via INTRAVENOUS

## 2021-11-05 MED ORDER — FENTANYL CITRATE (PF) 100 MCG/2ML IJ SOLN: 25.0000 ug | INTRAMUSCULAR | Status: DC | PRN

## 2021-11-05 MED ORDER — CEFAZOLIN SODIUM-DEXTROSE 2-4 GM/100ML-% IV SOLN
2.0000 g | Freq: Three times a day (TID) | INTRAVENOUS | Status: AC
  Administered 2021-11-05 – 2021-11-06 (×2): 2 g via INTRAVENOUS
  Filled 2021-11-05 (×2): qty 100

## 2021-11-05 MED ORDER — SODIUM CHLORIDE 0.9% FLUSH: 3.0000 mL | INTRAVENOUS | Status: DC | PRN

## 2021-11-05 MED ORDER — LACTATED RINGERS IV SOLN: INTRAVENOUS | Status: DC

## 2021-11-05 MED ORDER — OXYCODONE HCL 5 MG/5ML PO SOLN
5.0000 mg | Freq: Once | ORAL | Status: AC | PRN
  Administered 2021-11-05: 5 mg via ORAL

## 2021-11-05 MED ORDER — DOCUSATE SODIUM 100 MG PO CAPS
100.0000 mg | ORAL_CAPSULE | Freq: Two times a day (BID) | ORAL | Status: DC
  Administered 2021-11-06: 100 mg via ORAL
  Filled 2021-11-05 (×5): qty 1

## 2021-11-05 MED ORDER — ACETAMINOPHEN 500 MG PO TABS: 1000.0000 mg | ORAL_TABLET | Freq: Once | ORAL | Status: DC

## 2021-11-05 MED ORDER — FLEET ENEMA 7-19 GM/118ML RE ENEM: 1.0000 | ENEMA | Freq: Once | RECTAL | Status: DC | PRN

## 2021-11-05 MED ORDER — FENTANYL CITRATE (PF) 250 MCG/5ML IJ SOLN
INTRAMUSCULAR | Status: AC
  Filled 2021-11-05: qty 5

## 2021-11-05 SURGICAL SUPPLY — 43 items
BAG COUNTER SPONGE SURGICOUNT (BAG) ×2 IMPLANT
BLADE CLIPPER SURG (BLADE) IMPLANT
BLADE SURG 11 STRL SS (BLADE) ×2 IMPLANT
BNDG ADH 1X3 SHEER STRL LF (GAUZE/BANDAGES/DRESSINGS) ×8 IMPLANT
CEMENT KYPHON C01A KIT/MIXER (Cement) ×1 IMPLANT
CONT SPEC 4OZ CLIKSEAL STRL BL (MISCELLANEOUS) ×2 IMPLANT
DECANTER SPIKE VIAL GLASS SM (MISCELLANEOUS) ×2 IMPLANT
DERMABOND ADVANCED (GAUZE/BANDAGES/DRESSINGS) ×1
DERMABOND ADVANCED .7 DNX12 (GAUZE/BANDAGES/DRESSINGS) IMPLANT
DRAPE C-ARM 42X72 X-RAY (DRAPES) ×2 IMPLANT
DRAPE HALF SHEET 40X57 (DRAPES) ×2 IMPLANT
DRAPE INCISE IOBAN 66X45 STRL (DRAPES) ×2 IMPLANT
DRAPE LAPAROTOMY 100X72X124 (DRAPES) ×2 IMPLANT
DRAPE WARM FLUID 44X44 (DRAPES) ×2 IMPLANT
DURAPREP 26ML APPLICATOR (WOUND CARE) ×2 IMPLANT
GAUZE 4X4 16PLY ~~LOC~~+RFID DBL (SPONGE) ×2 IMPLANT
GLOVE BIO SURGEON STRL SZ7.5 (GLOVE) ×1 IMPLANT
GLOVE BIOGEL PI IND STRL 7.0 (GLOVE) IMPLANT
GLOVE BIOGEL PI IND STRL 7.5 (GLOVE) IMPLANT
GLOVE BIOGEL PI IND STRL 8.5 (GLOVE) ×1 IMPLANT
GLOVE BIOGEL PI INDICATOR 7.0 (GLOVE) ×1
GLOVE BIOGEL PI INDICATOR 7.5 (GLOVE) ×1
GLOVE BIOGEL PI INDICATOR 8.5 (GLOVE) ×1
GLOVE ECLIPSE 8.5 STRL (GLOVE) ×2 IMPLANT
GLOVE EXAM NITRILE XL STR (GLOVE) IMPLANT
GOWN STRL REUS W/ TWL LRG LVL3 (GOWN DISPOSABLE) IMPLANT
GOWN STRL REUS W/ TWL XL LVL3 (GOWN DISPOSABLE) ×1 IMPLANT
GOWN STRL REUS W/TWL 2XL LVL3 (GOWN DISPOSABLE) ×2 IMPLANT
GOWN STRL REUS W/TWL LRG LVL3 (GOWN DISPOSABLE)
GOWN STRL REUS W/TWL XL LVL3 (GOWN DISPOSABLE) ×2
KIT BASIN OR (CUSTOM PROCEDURE TRAY) ×2 IMPLANT
KIT TURNOVER KIT B (KITS) ×2 IMPLANT
NDL HYPO 25X1 1.5 SAFETY (NEEDLE) ×1 IMPLANT
NEEDLE HYPO 25X1 1.5 SAFETY (NEEDLE) ×2 IMPLANT
NS IRRIG 1000ML POUR BTL (IV SOLUTION) ×2 IMPLANT
PACK SURGICAL SETUP 50X90 (CUSTOM PROCEDURE TRAY) ×2 IMPLANT
PAD ARMBOARD 7.5X6 YLW CONV (MISCELLANEOUS) ×6 IMPLANT
SPECIMEN JAR SMALL (MISCELLANEOUS) IMPLANT
SUT VICRYL RAPIDE 4/0 PS 2 (SUTURE) ×2 IMPLANT
SYR CONTROL 10ML LL (SYRINGE) ×4 IMPLANT
TOWEL GREEN STERILE (TOWEL DISPOSABLE) ×2 IMPLANT
TOWEL GREEN STERILE FF (TOWEL DISPOSABLE) ×2 IMPLANT
TRAY KYPHOPAK 15/3 ONESTEP 1ST (MISCELLANEOUS) ×1 IMPLANT

## 2021-11-05 NOTE — Anesthesia Preprocedure Evaluation (Signed)
Anesthesia Evaluation  Patient identified by MRN, date of birth, ID band Patient awake    Reviewed: Allergy & Precautions, NPO status , Patient's Chart, lab work & pertinent test results, reviewed documented beta blocker date and time   Airway Mallampati: III  TM Distance: >3 FB Neck ROM: Full    Dental  (+) Chipped, Dental Advisory Given,    Pulmonary neg pulmonary ROS,    Pulmonary exam normal breath sounds clear to auscultation       Cardiovascular hypertension, Pt. on medications Normal cardiovascular exam Rhythm:Regular Rate:Normal  HLD   Neuro/Psych PSYCHIATRIC DISORDERS Anxiety Peripheral neuropathy  Neuromuscular disease    GI/Hepatic negative GI ROS, Neg liver ROS,   Endo/Other  Hyperlipidemia  Renal/GU negative Renal ROS  negative genitourinary   Musculoskeletal  (+) Arthritis , Compression Fx T9   Abdominal   Peds  Hematology  (+) Blood dyscrasia, anemia ,   Anesthesia Other Findings   Reproductive/Obstetrics                             Anesthesia Physical  Anesthesia Plan  ASA: 2  Anesthesia Plan: General   Post-op Pain Management: Tylenol PO (pre-op)* and Dilaudid IV   Induction: Intravenous  PONV Risk Score and Plan: 3 and Dexamethasone, Ondansetron and Treatment may vary due to age or medical condition  Airway Management Planned: Oral ETT  Additional Equipment:   Intra-op Plan:   Post-operative Plan: Extubation in OR  Informed Consent: I have reviewed the patients History and Physical, chart, labs and discussed the procedure including the risks, benefits and alternatives for the proposed anesthesia with the patient or authorized representative who has indicated his/her understanding and acceptance.     Dental advisory given  Plan Discussed with: CRNA  Anesthesia Plan Comments:         Anesthesia Quick Evaluation

## 2021-11-05 NOTE — Anesthesia Procedure Notes (Signed)
Procedure Name: Intubation Date/Time: 11/05/2021 5:47 PM Performed by: Moshe Salisbury, CRNA Pre-anesthesia Checklist: Patient identified, Emergency Drugs available, Suction available and Patient being monitored Patient Re-evaluated:Patient Re-evaluated prior to induction Oxygen Delivery Method: Circle System Utilized Preoxygenation: Pre-oxygenation with 100% oxygen Induction Type: IV induction Ventilation: Mask ventilation without difficulty Laryngoscope Size: Mac and 3 Grade View: Grade I Tube type: Oral Tube size: 7.5 mm Number of attempts: 1 Airway Equipment and Method: Stylet Placement Confirmation: ETT inserted through vocal cords under direct vision, positive ETCO2 and breath sounds checked- equal and bilateral Secured at: 21 cm Tube secured with: Tape Dental Injury: Teeth and Oropharynx as per pre-operative assessment

## 2021-11-05 NOTE — Progress Notes (Signed)
Patient ID: [name], female   DOB: 1940/07/16, 81 y.o.   MRN: [id number] Vital signs are stable patient is awake and alert.  She is able to be mobilized she can be discharged to a rehab center at her earliest convenience.

## 2021-11-05 NOTE — Anesthesia Postprocedure Evaluation (Signed)
Anesthesia Post Note  Patient: [name]  Procedure(s) Performed: THORACIC NINE KYPHOPLASTY (Back)     Patient location during evaluation: PACU Anesthesia Type: General Level of consciousness: awake and alert Pain management: pain level controlled Vital Signs Assessment: post-procedure vital signs reviewed and stable Respiratory status: spontaneous breathing, nonlabored ventilation, respiratory function stable and patient connected to nasal cannula oxygen Cardiovascular status: blood pressure returned to baseline and stable Postop Assessment: no apparent nausea or vomiting Anesthetic complications: no   No notable events documented.  Last Vitals:  Vitals:   11/05/21 1930 11/05/21 1945  BP: (!) 149/77 (!) 135/58  Pulse: 76 76  Resp: 19 20  Temp:    SpO2: 98% 93%    Last Pain:  Vitals:   11/05/21 1945  TempSrc:   PainSc: 4                  Kamaree Berkel P Tiberius Loftus

## 2021-11-05 NOTE — Transfer of Care (Signed)
Immediate Anesthesia Transfer of Care Note  Patient: [name]  Procedure(s) Performed: THORACIC NINE KYPHOPLASTY (Back)  Patient Location: PACU  Anesthesia Type:General  Level of Consciousness: drowsy and patient cooperative  Airway & Oxygen Therapy: Patient Spontanous Breathing and Patient connected to nasal cannula oxygen  Post-op Assessment: Report given to RN and Post -op Vital signs reviewed and stable  Post vital signs: Reviewed and stable  Last Vitals:  Vitals Value Taken Time  BP 148/60 11/05/21 1833  Temp    Pulse 86 11/05/21 1834  Resp 19 11/05/21 1834  SpO2 96 % 11/05/21 1834  Vitals shown include unvalidated device data.  Last Pain:  Vitals:   11/05/21 1452  TempSrc:   PainSc: 3       Patients Stated Pain Goal: 3 (11/04/21 1436)  Complications: No notable events documented.

## 2021-11-05 NOTE — Progress Notes (Signed)
PROGRESS NOTE    Enzo MontgomeryMartha E Maulding  WUJ:[id number] DOB: 04/23/1941 DOA: 10/29/2021 PCP: Geoffry ParadiseAronson, Richard, MD   Brief Narrative:  The patient is an 81 year old Caucasian female with a past medical history significant for but not limited to hypertension, history of lumbar radiculopathy status post fusion surgery of T12-L1 performed on 09/28/2021 with other comorbidities who presented to the [facility] ED with complaints of generalized weakness.  Since her surgery she has had ongoing back pain and generalized weakness and prior to admission she had near syncope at home.  MRI of lumbar spine showed a new T9 compression fracture with 20% height loss and Dr. Lurene ShadowSpongberg discussed with the on-call neurosurgical team who recommended no surgical intervention at this time and recommended managing this conservatively.  Patient is also had complaints of abdominal pain for which a CT scan was done and showed colitis.  C. difficile PCR and GI pathogen panel negative.  Patient has been initiated on IV antibiotics with ceftriaxone and IV Flagyl on 10/30/2021.  Patient continues to have back pain and 8 out of 10 is worse with movement and yesterday she continued to experience some diarrhea had some intermittent nausea but no vomiting.  She refused orthostatic vital signs yesterday morning as she did not have a back brace and PT OT still recommend home health services   Again she is found to have a hypokalemia and WBC is now trending down is resolved.  Is to go back to SN[facility] but after further discussion for with neurosurgery they felt that she would benefit from a kyphoplasty and so now she is being transferred to emergency room.  Patient's diarrhea has improved but she continues to still have some pain when ambulating.  We will stop her antibiotics today.  After the patient discussed with Dr. Danielle DessElsner she is agreeable to acrylic balloon kyphoplasty and procedure to be done later today: So she will be transferred to [facility] for surgical intervention.  She will  Assessment and Plan: Colitis WITH Sepsis and Generalized weakness -Following lumbar spinal surgery last month. -Will treat colitis, IV Rocephin 2 g daily and IV Flagyl 500 mg twice daily initiated on 10/30/2021-and will now discontinue and stop -C. difficile PCR and GI panel by PCR negative. -WBC went from 15.2 -> 14.8 -> 15.8 -> 11.3 -> 6.7 -> 6.1 and is now 5.5 -Continue to Monitor fever curve and WBC. -Continue with loperamide 4 mg p.o. as needed for diarrhea or loose stools -Getting IV fluid hydration with normal saline we will stop today -PT/OT recommending home Health initially but now will go back [facility]   T9 compression fracture acute -Dr. Lurene ShadowSpongberg discussed with Robbie Liscarolina neurosurgy for opinion in setting of recent T12/L1 -Initially there is no surgical recommendations warranted but after further discussion with Dr. Danielle DessElsner he thinks that this would help her until she is scheduled for an acrylic balloon kyphoplasty at [facility] so she will be transferred there today  Presyncope -Obtain orthostatic vital signs -Fall precautions -PT OT to assess and recommending Home Health initially but now recommending SNF   Hypomagnesemia -Patient's mag level is now 1.9 -Continue to monitor and replete as necessary -Repeat magnesium level in the a.m.   Hypokalemia -Mild with a potassium of 3.5 in the setting of her diarrhea and improved from yesterday for intervention -Continue to Monitor and Replete as Necessary -Repeat CMP in the AM    Chronic Normocytic Anemia of iron deficiency anemia -Looks like this onset a  month ago after lumbar surgery. -Patient's hemoglobin/hematocrit is relatively stable now at 9.3/29.1 -Checking Anemia Panel and showed an iron level of 51, UIBC 167, TIBC of 218, saturation ratios of 23%, ferritin level 300, folate level 13.7, vitamin B12 442 -Continue monitor for signs and symptoms of bleeding; no  overt bleeding noted -Repeat CBC within a.m.   Lumbar stenosis with neurogenic claudication -Recent spinal fusion surgery at end of March -MRI of T and L spine T9 COMP FX AS ABOVE - NEUROSURGERY-AS ABOVE she is undergoing pain balloon kyphoplasty today at [facility] -Analgesics as needed with hydrocodone/acetaminophen 1 tab p.o. every 4 as needed for moderate pain and severe pain and also has fentanyl 25 mcg IV every 2 as needed severe pain -Also on meloxicam 50 mg p.o. daily and methocarbamol 500 g p.o. every 6 as needed for muscle spasms   Essential Hypertension -BP was not at goal, elevated yesterday but is now improved   -Restarted home oral antihypertensives with enalapril 20 mg p.o. twice daily -Continue monitor blood pressures per protocol -Last blood pressure reading was 156/84   Hypoalbuminemia -Albumin level is now 2.4 yesterday and today is two-point -Continue monitor and trend and repeat CMP in a.m.   Hyperlipidemia -Continue with Pravastatin 20 mg p.o. nightly   Depression and Anxiety -Continue Sertraline 50 mg p.o. daily   Dementia -Continue Donepezil 5 mg p.o. nightly  DVT prophylaxis: SCDs Start: 10/29/21 2027    Code Status: Full Code Family Communication: No family currently at bedside  Disposition Plan:  Level of care: Telemetry Medical Status is: Inpatient Remains inpatient appropriate because: She is undergoing transfer to [facility] for acrylic balloon kyphoplasty   Consultants:  Neurosurgery Dr. Danielle Dess  Procedures:  Patient to undergo balloon kyphoplasty  Antimicrobials:  Anti-infectives (From admission, onward)    Start     Dose/Rate Route Frequency Ordered Stop   10/30/21 0730  metroNIDAZOLE (FLAGYL) IVPB 500 mg        500 mg 100 mL/hr over 60 Minutes Intravenous Every 12 hours 10/29/21 2037     10/30/21 0500  cefTRIAXone (ROCEPHIN) 2 g in sodium chloride 0.9 % 100 mL IVPB        2 g 200 mL/hr over 30 Minutes Intravenous Every 24  hours 10/29/21 2037     10/29/21 1700  ceFEPIme (MAXIPIME) 2 g in sodium chloride 0.9 % 100 mL IVPB        2 g 200 mL/hr over 30 Minutes Intravenous  Once 10/29/21 1646 10/29/21 1743   10/29/21 1700  metroNIDAZOLE (FLAGYL) IVPB 500 mg        500 mg 100 mL/hr over 60 Minutes Intravenous  Once 10/29/21 1646 10/29/21 2023   10/29/21 1700  vancomycin (VANCOCIN) IVPB 1000 mg/200 mL premix  Status:  Discontinued        1,000 mg 200 mL/hr over 60 Minutes Intravenous  Once 10/29/21 1646 10/29/21 1649   10/29/21 1700  vancomycin (VANCOREADY) IVPB 1500 mg/300 mL        1,500 mg 150 mL/hr over 120 Minutes Intravenous  Once 10/29/21 1649 10/29/21 2131       Subjective: Seen and examined at bedside and she was sitting in a chair and states that she is having some mild diarrhea.  No nausea or vomiting.  States that her pain was not as bad today but states is worse when she moves.  Feels okay.  Agreeable for her kyphoplasty and so she will be transferred to [facility].  No  other concerns or complaints this time.  Objective: Vitals:   11/04/21 1946 11/04/21 2307 11/05/21 0510 11/05/21 0512  BP: (!) 147/64 (!) 146/64  (!) 142/72  Pulse: 81   85  Resp: 20     Temp: 97.9 F (36.6 C)   98.8 F (37.1 C)  TempSrc: Oral   Oral  SpO2: 95%   97%  Weight:   81.9 kg   Height:        Intake/Output Summary (Last 24 hours) at 11/05/2021 1610 Last data filed at 11/05/2021 9604 Gross per 24 hour  Intake 360 ml  Output 350 ml  Net 10 ml   Filed Weights   11/03/21 0500 11/05/21 0510  Weight: 82.1 kg 81.9 kg   Examination: Physical Exam:  Constitutional: WN/WD overweight Caucasian female currently no acute distress Respiratory: Diminished to auscultation bilaterally, no wheezing, rales, rhonchi or crackles. Normal respiratory effort and patient is not tachypenic. No accessory muscle use.  Unlabored breathing Cardiovascular: RRR, no murmurs / rubs / gallops. S1 and S2 auscultated.   Abdomen: Soft, non-tender,  distended secondary body. Bowel sounds positive.  GU: Deferred. Musculoskeletal: No clubbing / cyanosis of digits/nails. No joint deformity upper and lower extremities. Neurologic: CN 2-12 grossly intact with no focal deficits. Romberg sign and cerebellar reflexes not assessed.  Psychiatric: Normal judgment and insight. Alert and oriented x 3. Normal mood and appropriate affect.   Data Reviewed: I have personally reviewed following labs and imaging studies  CBC: Recent Labs  Lab 11/01/21 0323 11/02/21 0329 11/03/21 0826 11/04/21 0410 11/05/21 0354  WBC 15.8* 11.3* 6.7 6.1 5.5  NEUTROABS 13.3* 8.8* 4.3 3.3 2.6  HGB 8.8* 9.8* 9.4* 9.5* 9.3*  HCT 26.8* 30.5* 29.1* 29.5* 29.1*  MCV 94.0 93.8 91.8 92.2 92.4  PLT 214 231 240 253 258   Basic Metabolic Panel: Recent Labs  Lab 11/01/21 0323 11/02/21 0329 11/03/21 0826 11/04/21 0410 11/05/21 0354  NA 141 141 140 141 140  K 3.1* 3.0* 3.2* 3.3* 3.5  CL 112* 109 104 107 108  CO2 23 23 26 28 25   GLUCOSE 84 81 93 107* 99  BUN 9 6* <5* <5* 7*  CREATININE 0.59 0.56 0.45 0.53 0.49  CALCIUM 8.3* 8.5* 8.2* 8.3* 8.3*  MG  --  1.7 1.6* 2.0 1.9  PHOS  --   --  3.1 3.1 3.4   GFR: Estimated Creatinine Clearance: 61.7 mL/min (by C-G formula based on SCr of 0.49 mg/dL). Liver Function Tests: Recent Labs  Lab 10/29/21 1244 11/03/21 0826 11/04/21 0410 11/05/21 0354  AST 20 14* 18 22  ALT 12 10 12 11   ALKPHOS 113 90 88 80  BILITOT 0.5 0.6 0.4 0.3  PROT 6.6 5.6* 5.5* 5.2*  ALBUMIN 3.4* 2.5* 2.6* 2.4*   No results for input(s): LIPASE, AMYLASE in the last 168 hours. No results for input(s): AMMONIA in the last 168 hours. Coagulation Profile: No results for input(s): INR, PROTIME in the last 168 hours. Cardiac Enzymes: No results for input(s): CKTOTAL, CKMB, CKMBINDEX, TROPONINI in the last 168 hours. BNP (last 3 results) No results for input(s): PROBNP in the last 8760 hours. HbA1C: No results for input(s): HGBA1C in the last 72  hours. CBG: No results for input(s): GLUCAP in the last 168 hours. Lipid Profile: No results for input(s): CHOL, HDL, LDLCALC, TRIG, CHOLHDL, LDLDIRECT in the last 72 hours. Thyroid Function Tests: No results for input(s): TSH, T4TOTAL, FREET4, T3FREE, THYROIDAB in the last 72 hours. Anemia Panel: Recent Labs  11/04/21 0410  VITAMINB12 442  FOLATE 13.7  FERRITIN 300  TIBC 218*  IRON 51  RETICCTPCT 1.6   Sepsis Labs: Recent Labs  Lab 10/29/21 1244 10/29/21 1405 10/29/21 1706 10/30/21 0331 10/31/21 0339  PROCALCITON <0.10  --   --  0.58 0.37  LATICACIDVEN  --  1.2 1.6  --   --     Recent Results (from the past 240 hour(s))  Culture, blood (single)     Status: None   Collection Time: 10/29/21  5:06 PM   Specimen: BLOOD  Result Value Ref Range Status   Specimen Description   Final    BLOOD LEFT ANTECUBITAL Performed at [facility], [address], [place], [place] [address]    Special Requests   Final    BOTTLES DRAWN AEROBIC AND ANAEROBIC Blood Culture adequate volume Performed at [facility], [address], [place], [place] [address]    Culture   Final    NO GROWTH 5 DAYS Performed at [facility], [address] [place], [place] [address]    Report Status 11/03/2021 FINAL  Final  C Difficile Quick Screen w PCR reflex     Status: None   Collection Time: 10/31/21  9:35 AM   Specimen: Stool  Result Value Ref Range Status   C Diff antigen NEGATIVE NEGATIVE Final   C Diff toxin NEGATIVE NEGATIVE Final   C Diff interpretation No C. difficile detected.  Final    Comment: Performed at [facility], [address], [place], [place] [address]  Gastrointestinal Panel by PCR , Stool     Status: None   Collection Time: 10/31/21  9:35 AM   Specimen: Stool  Result Value Ref Range Status   Campylobacter species NOT DETECTED NOT DETECTED Final   Plesimonas shigelloides NOT DETECTED NOT DETECTED Final    Salmonella species NOT DETECTED NOT DETECTED Final   Yersinia enterocolitica NOT DETECTED NOT DETECTED Final   Vibrio species NOT DETECTED NOT DETECTED Final   Vibrio cholerae NOT DETECTED NOT DETECTED Final   Enteroaggregative E coli (EAEC) NOT DETECTED NOT DETECTED Final   Enteropathogenic E coli (EPEC) NOT DETECTED NOT DETECTED Final   Enterotoxigenic E coli (ETEC) NOT DETECTED NOT DETECTED Final   Shiga like toxin producing E coli (STEC) NOT DETECTED NOT DETECTED Final   Shigella/Enteroinvasive E coli (EIEC) NOT DETECTED NOT DETECTED Final   Cryptosporidium NOT DETECTED NOT DETECTED Final   Cyclospora cayetanensis NOT DETECTED NOT DETECTED Final   Entamoeba histolytica NOT DETECTED NOT DETECTED Final   Giardia lamblia NOT DETECTED NOT DETECTED Final   Adenovirus F40/41 NOT DETECTED NOT DETECTED Final   Astrovirus NOT DETECTED NOT DETECTED Final   Norovirus GI/GII NOT DETECTED NOT DETECTED Final   Rotavirus A NOT DETECTED NOT DETECTED Final   Sapovirus (I, II, IV, and V) NOT DETECTED NOT DETECTED Final    Comment: Performed at [facility], [address] [place], [place] [address]    Radiology Studies: No results found.  Scheduled Meds:  amLODipine  5 mg Oral QHS   donepezil  5 mg Oral QHS   enalapril  20 mg Oral Q12H   feeding supplement  237 mL Oral BID BM   meloxicam  15 mg Oral Daily   multivitamin with minerals  1 tablet Oral Daily   potassium chloride  40 mEq Oral Once   pravastatin  20 mg Oral QHS   progesterone  100 mg Oral  QHS   sertraline  50 mg Oral Daily   Continuous Infusions:  cefTRIAXone (ROCEPHIN)  IV 2 g (11/05/21 0509)   metronidazole 500 mg (11/05/21 2130)    LOS: 6 days   Marguerita Merles, DO [facility] Available via Epic secure chat 7am-7pm After these hours, please refer to coverage provider listed on [email or web address] 11/05/2021, 8:38 AM

## 2021-11-05 NOTE — Op Note (Signed)
Date of surgery: 11/05/2021 Preoperative diagnosis: T9 compression fracture secondary to osteoporosis.  Recent fusion T10 to the sacrum. Postoperative diagnosis: Same Procedure: T9 acrylic balloon kyphoplasty Surgeon: Barnett Abu Anesthesia: General endotracheal Indications: [name] is an 81 year old individual who 3 weeks ago underwent surgical stabilization from T10-L2.  She already had a fusion from L 2 to the sacrum and this was not extension surgery.  As she had developed significant back pain in the past week and x-rays demonstrated presence of a new T9 compression fracture.  This was immediately above her fixation.  She was advised regarding acrylic balloon kyphoplasty as she has been having intractable pain despite significant pain medications.  She is not mobilizing because of this and she truly needs to be mobilized with her long fusion.  Procedure: Patient was brought to the operating room supine on stretcher.  After the smooth induction of general endotracheal anesthesia, she was carefully turned prone.  The back was prepped with alcohol DuraPrep and draped in a sterile fashion 9 first on the left than on the right and fluoroscopy was brought into the AP and lateral projections to verify the position of the T9 vertebrae in orthogonal fashion.  Then pedicle entry sites were chosen at the 9 and 3:00 positions on both the left and right pedicles.  Skin was infiltrated with lidocaine with epinephrine mixed 50-50 with Marcaine and a deep infiltration with Marcaine alone was performed down to the lateral aspect of the vertebral body.  A Jamshidi needle was then inserted after this incision was made with a #11 blade.  Jamshidi needle was entered through a transpedicular route into the vertebral body of T9 first on the left than on the right.  Next the drill was used to drill into the vertebral body.  A balloon was then inserted into each of the pedicle entry sites and the balloon was inflated 250  mmHg.  This created a significant central pocket.  Cement was mixed to appropriate hardness and went hard adequately a total of 4 cc of cement was injected into the vertebral body through both the left and right pedicle entry sites.  Good filling of the vertebral body and particularly the inferior endplate was achieved with this technique.  At this point no extravasation was noted and it was decided to stop the procedure the cannulas were removed and then the Jamshidi needles were removed and final radiographs identified no evidence of any tail formation from the cement.  With this the incisions were closed with a singular 4-0 Vicryl suture.  Dermabond was placed on the skin.  Patient tolerated procedure well was returned to recovery room in stable condition.  1 loss was nil

## 2021-11-05 NOTE — Progress Notes (Signed)
Patient ID: [name], female   DOB: Mar 28, 1941, 81 y.o.   MRN: [id number] Patient is for kyphoplasty today.

## 2021-11-05 NOTE — Progress Notes (Signed)
PT Cancellation Note  Patient Details Name: [name] MRN: [id number] DOB: June 20, 1940   Cancelled Treatment:    Reason Eval/Treat Not Completed: Pain limiting ability to participate. Per RN, pt in high pain, transferring to [facility] and declines PT today. Will continue to follow.    Domenick Bookbinder PT, DPT 11/05/21, 12:23 PM

## 2021-11-06 LAB — COMPREHENSIVE METABOLIC PANEL
ALT: 16 U/L (ref 0–44)
AST: 42 U/L — ABNORMAL HIGH (ref 15–41)
Albumin: 2.7 g/dL — ABNORMAL LOW (ref 3.5–5.0)
Alkaline Phosphatase: 107 U/L (ref 38–126)
Anion gap: 9 (ref 5–15)
BUN: <5 mg/dL — ABNORMAL LOW (ref 8–23)
CO2: 24 mmol/L (ref 22–32)
Calcium: 8.6 mg/dL — ABNORMAL LOW (ref 8.9–10.3)
Chloride: 103 mmol/L (ref 98–111)
Creatinine, Ser: 0.49 mg/dL (ref 0.44–1.00)
GFR, Estimated: >60 mL/min (ref >60)
Glucose, Bld: 152 mg/dL — ABNORMAL HIGH (ref 70–99)
Potassium: 4.2 mmol/L (ref 3.5–5.1)
Sodium: 136 mmol/L (ref 135–145)
Total Bilirubin: 0.2 mg/dL — ABNORMAL LOW (ref 0.3–1.2)
Total Protein: 5.8 g/dL — ABNORMAL LOW (ref 6.5–8.1)

## 2021-11-06 LAB — CBC WITH DIFFERENTIAL/PLATELET
Abs Immature Granulocytes: 0.17 10*3/uL — ABNORMAL HIGH (ref 0.00–0.07)
Basophils Absolute: 0 10*3/uL (ref 0.0–0.1)
Basophils Relative: 0 %
Eosinophils Absolute: 0 10*3/uL (ref 0.0–0.5)
Eosinophils Relative: 0 %
HCT: 31.1 % — ABNORMAL LOW (ref 36.0–46.0)
Hemoglobin: 9.9 g/dL — ABNORMAL LOW (ref 12.0–15.0)
Immature Granulocytes: 3 %
Lymphocytes Relative: 6 %
Lymphs Abs: 0.4 10*3/uL — ABNORMAL LOW (ref 0.7–4.0)
MCH: 29.1 pg (ref 26.0–34.0)
MCHC: 31.8 g/dL (ref 30.0–36.0)
MCV: 91.5 fL (ref 80.0–100.0)
Monocytes Absolute: 0.2 10*3/uL (ref 0.1–1.0)
Monocytes Relative: 3 %
Neutro Abs: 6 10*3/uL (ref 1.7–7.7)
Neutrophils Relative %: 88 %
Platelets: 291 10*3/uL (ref 150–400)
RBC: 3.4 MIL/uL — ABNORMAL LOW (ref 3.87–5.11)
RDW: 14.6 % (ref 11.5–15.5)
WBC: 6.8 10*3/uL (ref 4.0–10.5)
nRBC: 0 % (ref 0.0–0.2)

## 2021-11-06 LAB — MAGNESIUM: Magnesium: 1.7 mg/dL (ref 1.7–2.4)

## 2021-11-06 LAB — PHOSPHORUS: Phosphorus: 3.5 mg/dL (ref 2.5–4.6)

## 2021-11-06 NOTE — Progress Notes (Signed)
Physical Therapy Treatment Patient Details Name: [name] MRN: [id number] DOB: 06-06-1941 Today's Date: 11/06/2021   History of Present Illness Pt is an 81 y.o. female with recent herniated nucleus pulposus with severe stenosis T12-L1, s/p T12-L1 PLIF, T10-L3 posterior arthrodesis pedicle fixation on 09/28/21.Pt presented to [facility] ED with complaints of generalized weakness.  Since surgery she has had ongoing back pain, generalized weakness and prior to admission had near syncope at home.  MRI of spine showed a new T9 compression fracture with 20% height loss. Patient also had complaints of abdominal pain for which a CT scan was obtained showing colitis.  6/2 T9 kyphoplasty.PMH includes prior back sxs,bilateral TKA (2011, 2018), HTN, arthritis, anxiety.    PT Comments    Pt underwent kyphoplasty on 6/2.  PT treatment plan and goals remain appropriate. Pain seems to be better managed after procedure yesterday. Still recommend short term SNF for rehab prior to DC home.  Pt in agreement.     Recommendations for follow up therapy are one component of a multi-disciplinary discharge planning process, led by the attending physician.  Recommendations may be updated based on patient status, additional functional criteria and insurance authorization.  Follow Up Recommendations  Skilled nursing-short term rehab (<3 hours/day)     Assistance Recommended at Discharge Intermittent Supervision/Assistance  Patient can return home with the following A little help with walking and/or transfers;A little help with bathing/dressing/bathroom;Assistance with cooking/housework;Assist for transportation;Help with stairs or ramp for entrance   Equipment Recommendations  None recommended by PT    Recommendations for Other Services       Precautions / Restrictions Precautions Precautions: Fall;Back Precaution Booklet Issued: No Required Braces or Orthoses: Spinal Brace Spinal Brace: Lumbar corset;Applied in  sitting position Restrictions Weight Bearing Restrictions: No     Mobility  Bed Mobility Overal bed mobility: Needs Assistance Bed Mobility: Sit to Supine       Sit to supine: Min assist   General bed mobility comments: increased time and use of rail    Transfers Overall transfer level: Needs assistance Equipment used: Rolling walker (2 wheels) Transfers: Sit to/from Stand Sit to Stand: Min assist           General transfer comment: Cues for safe hand placement    Ambulation/Gait Ambulation/Gait assistance: Min guard Gait Distance (Feet): 30 Feet Assistive device: Rolling walker (2 wheels) Gait Pattern/deviations: Step-through pattern, Decreased stride length Gait velocity: slow, not formally measured     General Gait Details: no loss of balance   Stairs             Wheelchair Mobility    Modified Rankin (Stroke Patients Only)       Balance Overall balance assessment: Needs assistance                                          Cognition Arousal/Alertness: Awake/alert Behavior During Therapy: WFL for tasks assessed/performed Overall Cognitive Status: Within Functional Limits for tasks assessed                                          Exercises      General Comments General comments (skin integrity, edema, etc.): Pt up in chair with brace on when PT arrived and wanting to return to bed. no family  present      Pertinent Vitals/Pain Pain Assessment Pain Assessment: 0-10 Pain Score: 3  Pain Location: mid to low back Pain Descriptors / Indicators: Aching Pain Intervention(s): Limited activity within patient's tolerance, Monitored during session    Home Living Family/patient expects to be discharged to:: Skilled nursing facility                        Prior Function            PT Goals (current goals can now be found in the care plan section) Progress towards PT goals: Progressing toward  goals    Frequency    Min 3X/week      PT Plan Current plan remains appropriate    Co-evaluation              AM-PAC PT "6 Clicks" Mobility   Outcome Measure  Help needed turning from your back to your side while in a flat bed without using bedrails?: A Little Help needed moving from lying on your back to sitting on the side of a flat bed without using bedrails?: A Little Help needed moving to and from a bed to a chair (including a wheelchair)?: A Little Help needed standing up from a chair using your arms (e.g., wheelchair or bedside chair)?: A Little Help needed to walk in hospital room?: A Little Help needed climbing 3-5 steps with a railing? : A Lot 6 Click Score: 17    End of Session Equipment Utilized During Treatment: Gait belt;Back brace Activity Tolerance: Patient tolerated treatment well Patient left: in bed;with call bell/phone within reach;with bed alarm set   PT Visit Diagnosis: Difficulty in walking, not elsewhere classified (R26.2)     Time: 1062-6948 PT Time Calculation (min) (ACUTE ONLY): 13 min  Charges:  $Gait Training: 8-22 mins                     Lavona Mound, PT   Acute Rehabilitation Services  Office [phone] 11/06/2021    Donnella Sham 11/06/2021, 1:08 PM

## 2021-11-06 NOTE — Progress Notes (Signed)
[facility]                                                                               Channel Papandrea, is a 81 y.o. female, DOB - 13-Oct-1940, [id number] Admit date - 10/29/2021    Outpatient Primary MD for the patient is Geoffry Paradise, MD  LOS - 7  days    Brief summary   81 year old Caucasian female with a past medical history significant for but not limited to hypertension, history of lumbar radiculopathy status post fusion surgery of T12-L1 performed on 09/28/2021 with other comorbidities who presented to the ED with complaints of generalized weakness.  Patient is also had complaints of abdominal pain for which a CT scan was done and showed colitis.  C. difficile PCR and GI pathogen panel negative.  Patient has been initiated on IV antibiotics with ceftriaxone and IV Flagyl on 10/30/2021.  Patient continues to have back pain and 8 out of 10 is worse with movement. NS consulted and she was transferred to [facility] for kyphoplasty. Pt underwent T9 acrylic balloon kyphoplasty on 11/05/21. Therapy evaluations recommending SNF to [facility]  on Monday.    Assessment & Plan    Assessment and Plan: * Generalized weakness  Colitis of the Descending colon. Appears to have resolved. Completed the course of antibiotics.   Sepsis (HCC) ruled out.    Normocytic anemia:  Hemoglobin has been stable  around 9 in the last one week.   Lumbar stenosis with neurogenic claudication S/p surgical stabilization from T10 to L2.  She was found to have acute T9 compression fracture.   She underwent T9 acrylic balloon kyphoplasty on 11/05/21. Therapy evaluations recommending SNF to [facility]  on Monday.    Essential hypertension BP parameters are optimal.   Hypomagnesemia and hypokalemia  Replaced.    Dementia without any behavioral abnormalities Continue with Donepezil.    Depression and Anxiety.  Continue with Sertraline.     Hyperlipidemia:  Continue with pravastatin. 20  mg daily.         Malnutrition Type:  Nutrition Problem: Increased nutrient needs Etiology: acute illness   Malnutrition Characteristics:  Signs/Symptoms: estimated needs   Nutrition Interventions:  Interventions: Ensure Enlive (each supplement provides 350kcal and 20 grams of protein), MVI, Liberalize Diet, Refer to RD note for recommendations  Estimated body mass index is 28.28 kg/m as calculated from the following:   Height as of this encounter: 5\' 7"  (1.702 m).   Weight as of this encounter: 81.9 kg.  Code Status: full code.  DVT Prophylaxis:  SCD's Start: 11/05/21 2017 SCDs Start: 10/29/21 2027   Level of Care: Level of care: Telemetry Medical Family Communication: none at bedside.   Disposition Plan:     Remains inpatient appropriate:  SNF on Monday.   Procedures:  T9 acrylic balloon kyphoplasty on 11/05/21. By Neurosurgery Dr 01/05/22.    Consultants:   Neurosurgery.   Antimicrobials:   Anti-infectives (From admission, onward)    Start     Dose/Rate Route Frequency Ordered Stop   11/05/21 2300  ceFAZolin (ANCEF) IVPB 2g/100 mL premix        2 g 200  mL/hr over 30 Minutes Intravenous Every 8 hours 11/05/21 2017 11/06/21 0652   11/05/21 1731  ceFAZolin (ANCEF) 2-4 GM/100ML-% IVPB       Note to Pharmacy: Merril Abbe R: cabinet override      11/05/21 1731 11/06/21 0544   10/30/21 0730  metroNIDAZOLE (FLAGYL) IVPB 500 mg  Status:  Discontinued        500 mg 100 mL/hr over 60 Minutes Intravenous Every 12 hours 10/29/21 2037 11/05/21 1011   10/30/21 0500  cefTRIAXone (ROCEPHIN) 2 g in sodium chloride 0.9 % 100 mL IVPB  Status:  Discontinued        2 g 200 mL/hr over 30 Minutes Intravenous Every 24 hours 10/29/21 2037 11/05/21 1011   10/29/21 1700  ceFEPIme (MAXIPIME) 2 g in sodium chloride 0.9 % 100 mL IVPB        2 g 200 mL/hr over 30 Minutes Intravenous  Once 10/29/21 1646 10/29/21 1743   10/29/21 1700  metroNIDAZOLE (FLAGYL) IVPB 500 mg        500  mg 100 mL/hr over 60 Minutes Intravenous  Once 10/29/21 1646 10/29/21 2023   10/29/21 1700  vancomycin (VANCOCIN) IVPB 1000 mg/200 mL premix  Status:  Discontinued        1,000 mg 200 mL/hr over 60 Minutes Intravenous  Once 10/29/21 1646 10/29/21 1649   10/29/21 1700  vancomycin (VANCOREADY) IVPB 1500 mg/300 mL        1,500 mg 150 mL/hr over 120 Minutes Intravenous  Once 10/29/21 1649 10/29/21 2131        Medications  Scheduled Meds:  amLODipine  5 mg Oral QHS   docusate sodium  100 mg Oral BID   donepezil  5 mg Oral QHS   enalapril  20 mg Oral Q12H   feeding supplement  237 mL Oral BID BM   meloxicam  15 mg Oral Daily   multivitamin with minerals  1 tablet Oral Daily   pravastatin  20 mg Oral QHS   progesterone  100 mg Oral QHS   senna  1 tablet Oral BID   sertraline  50 mg Oral Daily   sodium chloride flush  3 mL Intravenous Q12H   Continuous Infusions:  sodium chloride     PRN Meds:.acetaminophen **OR** acetaminophen, alum & mag hydroxide-simeth, antiseptic oral rinse, bisacodyl, fentaNYL (SUBLIMAZE) injection, gabapentin, HYDROcodone-acetaminophen, lip balm, loperamide, menthol-cetylpyridinium **OR** phenol, methocarbamol, ondansetron **OR** ondansetron (ZOFRAN) IV, polyethylene glycol, promethazine **OR** prochlorperazine **OR** promethazine, sodium chloride flush, sodium phosphate    Subjective:   [name] was seen and examined today.  Pain better controlled.   Objective:   Vitals:   11/05/21 2022 11/06/21 0607 11/06/21 0908 11/06/21 1613  BP: 139/65 (!) 143/78 (!) 146/65 (!) 125/52  Pulse: 74 80 77 75  Resp: 17 16 18 16   Temp:  (!) 97.5 F (36.4 C) 98.1 F (36.7 C) 98.5 F (36.9 C)  TempSrc:  Oral Oral Oral  SpO2: 93% 92% 94% 97%  Weight:      Height:        Intake/Output Summary (Last 24 hours) at 11/06/2021 1629 Last data filed at 11/06/2021 0456 Gross per 24 hour  Intake 100 ml  Output --  Net 100 ml   Filed Weights   11/03/21 0500 11/05/21  0510  Weight: 82.1 kg 81.9 kg     Exam General: Alert and oriented x 3, NAD Cardiovascular: S1 S2 auscultated, no murmurs, RRR Respiratory: Clear to auscultation bilaterally, no wheezing, rales or rhonchi  Gastrointestinal: Soft, nontender, nondistended, + bowel sounds Ext: no pedal edema bilaterally Neuro: AAOx3, Cr N's II- XII. Strength 5/5 upper and lower extremities bilaterally Skin: No rashes Psych: Normal affect and demeanor, alert and oriented x3    Data Reviewed:  I have personally reviewed following labs and imaging studies   CBC Lab Results  Component Value Date   WBC 6.8 11/06/2021   RBC 3.40 (L) 11/06/2021   HGB 9.9 (L) 11/06/2021   HCT 31.1 (L) 11/06/2021   MCV 91.5 11/06/2021   MCH 29.1 11/06/2021   PLT 291 11/06/2021   MCHC 31.8 11/06/2021   RDW 14.6 11/06/2021   LYMPHSABS 0.4 (L) 11/06/2021   MONOABS 0.2 11/06/2021   EOSABS 0.0 11/06/2021   BASOSABS 0.0 11/06/2021     Last metabolic panel Lab Results  Component Value Date   NA 136 11/06/2021   K 4.2 11/06/2021   CL 103 11/06/2021   CO2 24 11/06/2021   BUN <5 (L) 11/06/2021   CREATININE 0.49 11/06/2021   GLUCOSE 152 (H) 11/06/2021   GFRNONAA >60 11/06/2021   GFRAA >60 08/27/2019   CALCIUM 8.6 (L) 11/06/2021   PHOS 3.5 11/06/2021   PROT 5.8 (L) 11/06/2021   ALBUMIN 2.7 (L) 11/06/2021   BILITOT 0.2 (L) 11/06/2021   ALKPHOS 107 11/06/2021   AST 42 (H) 11/06/2021   ALT 16 11/06/2021   ANIONGAP 9 11/06/2021    CBG (last 3)  Recent Labs    11/05/21 1140  GLUCAP 105*      Coagulation Profile: No results for input(s): INR, PROTIME in the last 168 hours.   Radiology Studies: DG THORACOLUMABAR SPINE  Result Date: 11/05/2021 CLINICAL DATA:  [id number].  Surgery: THORACIC NINE KYPHOPLASTY RSTO EXAM: THORACOLUMBAR SPINE 1V COMPARISON:  CT abdomen pelvis 10/29/2021 FINDINGS: Intraoperative T9 kyphoplasty. 2 low resolution intraoperative spot views of the mid to lower thoracic were obtained. No  new fracture visible on the limited views. Partially visualized thoracic surgical hardware. Total fluoroscopy time: 1 minute and 6 seconds Total radiation dose: 13.96 mGy IMPRESSION: Intraoperative T9 kyphoplasty. Electronically Signed   By: Tish FredericksonMorgane  Naveau M.D.   On: 11/05/2021 18:51   DG C-Arm 1-60 Min-No Report  Result Date: 11/05/2021 Fluoroscopy was utilized by the requesting physician.  No radiographic interpretation.       Kathlen ModyVijaya Deseree Zemaitis M.D. [facility] 11/06/2021, 4:29 PM  Available via Epic secure chat 7am-7pm After 7 pm, please refer to night coverage provider listed on amion.

## 2021-11-06 NOTE — Progress Notes (Signed)
Occupational Therapy Treatment Patient Details Name: [name] MRN: [id number] DOB: Feb 21, 1941 Today's Date: 11/06/2021   History of present illness Pt is an 81 y.o. female with recent herniated nucleus pulposus with severe stenosis T12-L1, s/p T12-L1 PLIF, T10-L3 posterior arthrodesis pedicle fixation on 09/28/21.Pt presented to [facility] ED with complaints of generalized weakness.  Since surgery she has had ongoing back pain, generalized weakness and prior to admission had near syncope at home.  MRI of spine showed a new T9 compression fracture with 20% height loss. Patient also had complaints of abdominal pain for which a CT scan was obtained showing colitis.  6/2 T9 kyphoplasty.PMH includes prior back sxs,bilateral TKA (2011, 2018), HTN, arthritis, anxiety.   OT comments  This 81 yo female admitted and initially evaled then had a kyphoplasty 6/2 presents to acute OT with continuing to need A but making progress since procedure yesterday compared to when last seen. She currently is at a setup/S-Mod A level for basic ADLs and Min A-minguard A for all mobility. She will continue to benefit from acute OT with follow up back at SNF then home.   Recommendations for follow up therapy are one component of a multi-disciplinary discharge planning process, led by the attending physician.  Recommendations may be updated based on patient status, additional functional criteria and insurance authorization.    Follow Up Recommendations  Skilled nursing-short term rehab (<3 hours/day)    Assistance Recommended at Discharge Frequent or constant Supervision/Assistance  Patient can return home with the following  A little help with walking and/or transfers;A little help with bathing/dressing/bathroom;Help with stairs or ramp for entrance;Assistance with cooking/housework;Assist for transportation   Equipment Recommendations  None recommended by OT       Precautions / Restrictions Precautions Precautions:  Fall;Back Precaution Booklet Issued: No Required Braces or Orthoses: Spinal Brace Spinal Brace: Lumbar corset;Applied in sitting position Restrictions Weight Bearing Restrictions: No       Mobility Bed Mobility Overal bed mobility: Needs Assistance Bed Mobility: Rolling, Sidelying to Sit Rolling: Supervision Sidelying to sit: Supervision       General bed mobility comments: increased time and use of rail    Transfers Overall transfer level: Needs assistance Equipment used: Rolling walker (2 wheels) Transfers: Sit to/from Stand Sit to Stand: Min assist           General transfer comment: Cues for safe hand placement     Balance Overall balance assessment: Needs assistance Sitting-balance support: No upper extremity supported, Feet supported Sitting balance-Leahy Scale: Good     Standing balance support: No upper extremity supported, During functional activity Standing balance-Leahy Scale: Fair Standing balance comment: standing at sink for grooming                           ADL either performed or assessed with clinical judgement   ADL Overall ADL's : Needs assistance/impaired Eating/Feeding: Independent;Sitting   Grooming: Min guard;Standing;Wash/dry face;Wash/dry hands;Brushing hair   Upper Body Bathing: Set up;Supervision/ safety;Sitting   Lower Body Bathing: Minimal assistance;Sit to/from stand   Upper Body Dressing : Set up;Supervision/safety;Sitting   Lower Body Dressing: Minimal assistance;Sit to/from stand   Toilet Transfer: Minimal assistance;Ambulation;Rolling walker (2 wheels);Grab bars;Comfort height toilet   Toileting- Clothing Manipulation and Hygiene: Moderate assistance Toileting - Clothing Manipulation Details (indicate cue type and reason): min  A sit>stand (with grab bar); A for back peri care            Extremity/Trunk Assessment  Upper Extremity Assessment Upper Extremity Assessment: Overall WFL for tasks assessed             Vision Ability to See in Adequate Light: 0 Adequate Patient Visual Report: No change from baseline            Cognition Arousal/Alertness: Awake/alert Behavior During Therapy: WFL for tasks assessed/performed Overall Cognitive Status: Within Functional Limits for tasks assessed                                                     Pertinent Vitals/ Pain       Pain Assessment Pain Assessment: No/denies pain  Home Living Family/patient expects to be discharged to:: Skilled nursing facility                                            Frequency  Min 2X/week         OT Goals(current goals can now be found in the care plan section)     Acute Rehab OT Goals Patient Stated Goal: to go back to rehab and then home OT Goal Formulation: With patient Time For Goal Achievement: 11/20/21 Potential to Achieve Goals: Good  Plan         AM-PAC OT "6 Clicks" Daily Activity     Outcome Measure   Help from another person eating meals?: None Help from another person taking care of personal grooming?: A Little Help from another person toileting, which includes using toliet, bedpan, or urinal?: A Lot Help from another person bathing (including washing, rinsing, drying)?: A Little Help from another person to put on and taking off regular upper body clothing?: A Little Help from another person to put on and taking off regular lower body clothing?: A Little 6 Click Score: 18    End of Session Equipment Utilized During Treatment: Gait belt;Rolling walker (2 wheels);Back brace  OT Visit Diagnosis: Unsteadiness on feet (R26.81);Other abnormalities of gait and mobility (R26.89)   Activity Tolerance Patient tolerated treatment well   Patient Left in chair;with call bell/phone within reach;with chair alarm set   Nurse Communication Mobility status        Time: [id number] OT Time Calculation (min): 37 min  Charges: OT General  Charges $OT Visit: 1 Visit OT Evaluation $OT Re-eval: 1 Re-eval OT Treatments $Self Care/Home Management : 8-22 mins  [place] Circle, OTR/L Acute Rehab Services [facility] [phone] Office [phone]    Almon Register 11/06/2021, 11:28 AM

## 2021-11-07 NOTE — Progress Notes (Signed)
[facility]                                                                               [name], is a 81 y.o. female, DOB - [birth year], [id number] Admit date - 10/29/2021    Outpatient Primary MD for the patient is Burnard Bunting, MD  LOS - 8  days    Brief summary   81 year old Caucasian female with a past medical history significant for but not limited to hypertension, history of lumbar radiculopathy status post fusion surgery of T12-L1 performed on 09/28/2021 with other comorbidities who presented to the ED with complaints of generalized weakness.  Patient is also had complaints of abdominal pain for which a CT scan was done and showed colitis.  C. difficile PCR and GI pathogen panel negative.  Patient has been initiated on IV antibiotics with ceftriaxone and IV Flagyl on 10/30/2021.  Patient continues to have back pain and 8 out of 10 is worse with movement. NS consulted and she was transferred to [facility] for kyphoplasty. Pt underwent T9 acrylic balloon kyphoplasty on 11/05/21. Therapy evaluations recommending SNF to [facility]  on Monday.  No new complaints today.    Assessment & Plan    Assessment and Plan: * Generalized weakness  Colitis of the Descending colon. Appears to have resolved. Completed the course of antibiotics.   Sepsis ([place]) ruled out.    Normocytic anemia:  Hemoglobin has been stable  around 9 in the last one week.   Lumbar stenosis with neurogenic claudication S/p surgical stabilization from T10 to L2.  She was found to have acute T9 compression fracture.   She underwent T9 acrylic balloon kyphoplasty on 11/05/21. Therapy evaluations recommending SNF to [facility]  on Monday.  Pain control.   Essential hypertension BP parameters are well controlled.    Hypomagnesemia and hypokalemia  Replaced.    Dementia without any behavioral abnormalities Continue with Donepezil.    Depression and Anxiety.  Continue with Sertraline.      Hyperlipidemia:  Continue with pravastatin. 20 mg daily.    Malnutrition Type:  Nutrition Problem: Increased nutrient needs Etiology: acute illness   Malnutrition Characteristics:  Signs/Symptoms: estimated needs   Nutrition Interventions:  Interventions: Ensure Enlive (each supplement provides 350kcal and 20 grams of protein), MVI, Liberalize Diet, Refer to RD note for recommendations  Estimated body mass index is 28.28 kg/m as calculated from the following:   Height as of this encounter: 5\' 7"  (1.702 m).   Weight as of this encounter: 81.9 kg.  Code Status: full code.  DVT Prophylaxis:  SCD's Start: 11/05/21 2017 SCDs Start: 10/29/21 2027   Level of Care: Level of care: Telemetry Medical Family Communication: none at bedside.   Disposition Plan:     Remains inpatient appropriate:  SNF on Monday.   Procedures:  T9 acrylic balloon kyphoplasty on 11/05/21. By Neurosurgery Dr Ellene Route.    Consultants:   Neurosurgery.   Antimicrobials:   Anti-infectives (From admission, onward)    Start     Dose/Rate Route Frequency Ordered Stop   11/05/21 2300  ceFAZolin (ANCEF) IVPB 2g/100 mL premix  2 g 200 mL/hr over 30 Minutes Intravenous Every 8 hours 11/05/21 2017 11/06/21 0652   11/05/21 1731  ceFAZolin (ANCEF) 2-4 GM/100ML-% IVPB       Note to Pharmacy: Claybon Jabs R: cabinet override      11/05/21 1731 11/06/21 0544   10/30/21 0730  metroNIDAZOLE (FLAGYL) IVPB 500 mg  Status:  Discontinued        500 mg 100 mL/hr over 60 Minutes Intravenous Every 12 hours 10/29/21 2037 11/05/21 1011   10/30/21 0500  cefTRIAXone (ROCEPHIN) 2 g in sodium chloride 0.9 % 100 mL IVPB  Status:  Discontinued        2 g 200 mL/hr over 30 Minutes Intravenous Every 24 hours 10/29/21 2037 11/05/21 1011   10/29/21 1700  ceFEPIme (MAXIPIME) 2 g in sodium chloride 0.9 % 100 mL IVPB        2 g 200 mL/hr over 30 Minutes Intravenous  Once 10/29/21 1646 10/29/21 1743   10/29/21 1700   metroNIDAZOLE (FLAGYL) IVPB 500 mg        500 mg 100 mL/hr over 60 Minutes Intravenous  Once 10/29/21 1646 10/29/21 2023   10/29/21 1700  vancomycin (VANCOCIN) IVPB 1000 mg/200 mL premix  Status:  Discontinued        1,000 mg 200 mL/hr over 60 Minutes Intravenous  Once 10/29/21 1646 10/29/21 1649   10/29/21 1700  vancomycin (VANCOREADY) IVPB 1500 mg/300 mL        1,500 mg 150 mL/hr over 120 Minutes Intravenous  Once 10/29/21 1649 10/29/21 2131        Medications  Scheduled Meds:  amLODipine  5 mg Oral QHS   docusate sodium  100 mg Oral BID   donepezil  5 mg Oral QHS   enalapril  20 mg Oral Q12H   feeding supplement  237 mL Oral BID BM   meloxicam  15 mg Oral Daily   multivitamin with minerals  1 tablet Oral Daily   pravastatin  20 mg Oral QHS   progesterone  100 mg Oral QHS   senna  1 tablet Oral BID   sertraline  50 mg Oral Daily   sodium chloride flush  3 mL Intravenous Q12H   Continuous Infusions:  sodium chloride     PRN Meds:.acetaminophen **OR** acetaminophen, alum & mag hydroxide-simeth, antiseptic oral rinse, bisacodyl, fentaNYL (SUBLIMAZE) injection, gabapentin, HYDROcodone-acetaminophen, lip balm, loperamide, menthol-cetylpyridinium **OR** phenol, methocarbamol, ondansetron **OR** ondansetron (ZOFRAN) IV, polyethylene glycol, promethazine **OR** prochlorperazine **OR** promethazine, sodium chloride flush, sodium phosphate    Subjective:   [name] was seen and examined today.  Pain much better, with brace in the chair.   Objective:   Vitals:   11/06/21 2058 11/07/21 0046 11/07/21 0631 11/07/21 1100  BP: 117/66 (!) 133/55 (!) 132/56 105/68  Pulse: 72 76 70 78  Resp: 16 17 16 17   Temp: 98 F (36.7 C) 98.1 F (36.7 C) 98.2 F (36.8 C) 97.6 F (36.4 C)  TempSrc: Oral Oral Oral Oral  SpO2: 95% 90% 93% 97%  Weight:      Height:        Intake/Output Summary (Last 24 hours) at 11/07/2021 1733 Last data filed at 11/07/2021 [id number] Gross per 24 hour  Intake  --  Output 400 ml  Net -400 ml    Filed Weights   11/03/21 0500 11/05/21 0510  Weight: 82.1 kg 81.9 kg     Exam General exam: Appears calm and comfortable  Respiratory system: Clear to auscultation. Respiratory effort  normal. Cardiovascular system: S1 & S2 heard, RRR. No JVD, murmurs, rubs, gallops or clicks. No pedal edema. Gastrointestinal system: Abdomen is nondistended, soft and nontender. No organomegaly or masses felt. Normal bowel sounds heard. Central nervous system: Alert and oriented. No focal neurological deficits. Extremities: Symmetric 5 x 5 power. Skin: No rashes, lesions or ulcers Psychiatry: Judgement and insight appear normal. Mood & affect appropriate.     Data Reviewed:  I have personally reviewed following labs and imaging studies   CBC Lab Results  Component Value Date   WBC 6.8 11/06/2021   RBC 3.40 (L) 11/06/2021   HGB 9.9 (L) 11/06/2021   HCT 31.1 (L) 11/06/2021   MCV 91.5 11/06/2021   MCH 29.1 11/06/2021   PLT 291 11/06/2021   MCHC 31.8 11/06/2021   RDW 14.6 11/06/2021   LYMPHSABS 0.4 (L) 11/06/2021   MONOABS 0.2 11/06/2021   EOSABS 0.0 11/06/2021   BASOSABS 0.0 [id number]     Last metabolic panel Lab Results  Component Value Date   NA 136 11/06/2021   K 4.2 11/06/2021   CL 103 11/06/2021   CO2 24 11/06/2021   BUN <5 (L) 11/06/2021   CREATININE 0.49 11/06/2021   GLUCOSE 152 (H) 11/06/2021   GFRNONAA >60 11/06/2021   GFRAA >60 08/27/2019   CALCIUM 8.6 (L) 11/06/2021   PHOS 3.5 11/06/2021   PROT 5.8 (L) 11/06/2021   ALBUMIN 2.7 (L) 11/06/2021   BILITOT 0.2 (L) 11/06/2021   ALKPHOS 107 11/06/2021   AST 42 (H) 11/06/2021   ALT 16 11/06/2021   ANIONGAP 9 11/06/2021    CBG (last 3)  Recent Labs    11/05/21 1140  GLUCAP 105*       Coagulation Profile: No results for input(s): INR, PROTIME in the last 168 hours.   Radiology Studies: DG THORACOLUMABAR SPINE  Result Date: 11/05/2021 CLINICAL DATA:  [id number].  Surgery:  THORACIC NINE KYPHOPLASTY RSTO EXAM: THORACOLUMBAR SPINE 1V COMPARISON:  CT abdomen pelvis 10/29/2021 FINDINGS: Intraoperative T9 kyphoplasty. 2 low resolution intraoperative spot views of the mid to lower thoracic were obtained. No new fracture visible on the limited views. Partially visualized thoracic surgical hardware. Total fluoroscopy time: 1 minute and 6 seconds Total radiation dose: 13.96 mGy IMPRESSION: Intraoperative T9 kyphoplasty. Electronically Signed   By: Iven Finn M.D.   On: 11/05/2021 18:51   DG C-Arm 1-60 Min-No Report  Result Date: 11/05/2021 Fluoroscopy was utilized by the requesting physician.  No radiographic interpretation.       Hosie Poisson M.D. [facility] 11/07/2021, 5:33 PM  Available via Epic secure chat 7am-7pm After 7 pm, please refer to night coverage provider listed on amion.

## 2021-11-08 ENCOUNTER — Encounter (HOSPITAL_COMMUNITY): Payer: Self-pay | Admitting: Neurological Surgery

## 2021-11-08 MED ORDER — ENSURE ENLIVE PO LIQD: 237.0000 mL | Freq: Two times a day (BID) | ORAL | 2 refills | Status: AC

## 2021-11-08 MED ORDER — ADULT MULTIVITAMIN W/MINERALS CH: 1.0000 | ORAL_TABLET | Freq: Every day | ORAL | Status: AC

## 2021-11-08 MED ORDER — POLYETHYLENE GLYCOL 3350 17 G PO PACK: 17.0000 g | PACK | Freq: Every day | ORAL | 0 refills | Status: AC | PRN

## 2021-11-08 MED ORDER — SENNA 8.6 MG PO TABS
1.0000 | ORAL_TABLET | Freq: Two times a day (BID) | ORAL | 0 refills | Status: AC
Start: 2021-11-08 — End: ?

## 2021-11-08 MED ORDER — DOCUSATE SODIUM 100 MG PO CAPS: 100.0000 mg | ORAL_CAPSULE | Freq: Two times a day (BID) | ORAL | 0 refills | Status: AC

## 2021-11-08 MED ORDER — HYDROCODONE-ACETAMINOPHEN 5-325 MG PO TABS: 1.0000 | ORAL_TABLET | Freq: Four times a day (QID) | ORAL | 0 refills | Status: AC | PRN

## 2021-11-08 NOTE — Discharge Summary (Signed)
Physician Discharge Summary   Patient: [name] MRN: [id number] DOB: 1941/04/23  Admit date:     10/29/2021  Discharge date: 11/08/21  Discharge Physician: Hosie Poisson   PCP: Burnard Bunting, MD   Recommendations at discharge:  Please follow up with PCP in one week.   Discharge Diagnoses: Principal Problem:   Generalized weakness Active Problems:   Anemia   Sepsis ([place])   Colitis   Lumbar stenosis with neurogenic claudication   Essential hypertension   Hospital Course: 81 year old Caucasian female with a past medical history significant for but not limited to hypertension, history of lumbar radiculopathy status post fusion surgery of T12-L1 performed on 09/28/2021 with other comorbidities who presented to the ED with complaints of generalized weakness.  Patient is also had complaints of abdominal pain for which a CT scan was done and showed colitis.  C. difficile PCR and GI pathogen panel negative.  Patient has been initiated on IV antibiotics with ceftriaxone and IV Flagyl on 10/30/2021.  Patient continues to have back pain and 8 out of 10 is worse with movement. NS consulted and she was transferred to [facility] for kyphoplasty. Pt underwent T9 acrylic balloon kyphoplasty on 11/05/21. Therapy evaluations recommending SNF to [facility]  on Monday.  No new complaints today.    Assessment and Plan: Generalized weakness   Colitis of the Descending colon. Appears to have resolved. Completed the course of antibiotics.    Sepsis ([place]) ruled out.      Normocytic anemia:  Hemoglobin has been stable  around 9 in the last one week.    Lumbar stenosis with neurogenic claudication S/p surgical stabilization from T10 to L2.  She was found to have acute T9 compression fracture.   She underwent T9 acrylic balloon kyphoplasty on 11/05/21. Therapy evaluations recommending SNF to [facility]  on Monday.  Pain control.    Essential hypertension BP parameters are well controlled.       Hypomagnesemia and hypokalemia  Replaced.      Dementia without any behavioral abnormalities Continue with Donepezil.      Depression and Anxiety.  Continue with Sertraline.        Hyperlipidemia:  Continue with pravastatin. 20 mg daily.           Consultants: neuro surgery.  Procedures performed: kyphoplasty.   Disposition: Skilled nursing facility Diet recommendation:  Discharge Diet Orders (From admission, onward)     Start     Ordered   11/08/21 0000  Diet - low sodium heart healthy        11/08/21 0749           Regular diet DISCHARGE MEDICATION: Allergies as of 11/08/2021       Reactions   Morphine Other (See Comments)   vomiting        Medication List     TAKE these medications    amLODipine 5 MG tablet Commonly known as: NORVASC Take 5 mg by mouth at bedtime.   docusate sodium 100 MG capsule Commonly known as: COLACE Take 1 capsule (100 mg total) by mouth 2 (two) times daily.   donepezil 5 MG tablet Commonly known as: ARICEPT Take 1 tablet by mouth at bedtime.   enalapril 20 MG tablet Commonly known as: VASOTEC Take 20 mg by mouth 2 (two) times daily.   estradiol 0.5 MG tablet Commonly known as: ESTRACE Take one half tablet (0.25 mg) by mouth daily. What changed:  how much to take how to take this when to take  this additional instructions   feeding supplement Liqd Take 237 mLs by mouth 2 (two) times daily between meals.   gabapentin 300 MG capsule Commonly known as: NEURONTIN Take 300 mg by mouth daily as needed (Nerve pain).   HYDROcodone-acetaminophen 5-325 MG tablet Commonly known as: NORCO/VICODIN Take 1 tablet by mouth every 6 (six) hours as needed for up to 1 day for severe pain. What changed:  when to take this reasons to take this   meloxicam 15 MG tablet Commonly known as: MOBIC Take 15 mg by mouth daily.   methocarbamol 500 MG tablet Commonly known as: ROBAXIN Take 1 tablet (500 mg total) by mouth every  6 (six) hours as needed for muscle spasms.   multivitamin with minerals Tabs tablet Take 1 tablet by mouth daily.   polyethylene glycol 17 g packet Commonly known as: MIRALAX / GLYCOLAX Take 17 g by mouth daily as needed for mild constipation.   pravastatin 20 MG tablet Commonly known as: PRAVACHOL Take 20 mg by mouth at bedtime.   progesterone 100 MG capsule Commonly known as: Prometrium Take 1 capsule (100 mg total) by mouth daily. What changed: when to take this   senna 8.6 MG Tabs tablet Commonly known as: SENOKOT Take 1 tablet (8.6 mg total) by mouth 2 (two) times daily.   sertraline 50 MG tablet Commonly known as: ZOLOFT Take 50 mg by mouth daily.   Vitamin D3 50 MCG (2000 UT) capsule Take 2,000 Units by mouth daily.        Discharge Exam: Filed Weights   11/03/21 0500 11/05/21 0510  Weight: 82.1 kg 81.9 kg   General exam: Appears calm and comfortable  Respiratory system: Clear to auscultation. Respiratory effort normal. Cardiovascular system: S1 & S2 heard, RRR. No JVD, murmurs, rubs, gallops or clicks. No pedal edema. Gastrointestinal system: Abdomen is nondistended, soft and nontender. No organomegaly or masses felt. Normal bowel sounds heard. Central nervous system: Alert and oriented. No focal neurological deficits. Extremities: Symmetric 5 x 5 power. Skin: No rashes, lesions or ulcers Psychiatry: Judgement and insight appear normal. Mood & affect appropriate.    Condition at discharge: fair  The results of significant diagnostics from this hospitalization (including imaging, microbiology, ancillary and laboratory) are listed below for reference.   Imaging Studies: DG THORACOLUMABAR SPINE  Result Date: 11/05/2021 CLINICAL DATA:  [id number].  Surgery: THORACIC NINE KYPHOPLASTY RSTO EXAM: THORACOLUMBAR SPINE 1V COMPARISON:  CT abdomen pelvis 10/29/2021 FINDINGS: Intraoperative T9 kyphoplasty. 2 low resolution intraoperative spot views of the mid to lower  thoracic were obtained. No new fracture visible on the limited views. Partially visualized thoracic surgical hardware. Total fluoroscopy time: 1 minute and 6 seconds Total radiation dose: 13.96 mGy IMPRESSION: Intraoperative T9 kyphoplasty. Electronically Signed   By: Iven Finn M.D.   On: 11/05/2021 18:51   MR THORACIC SPINE WO CONTRAST  Result Date: 10/29/2021 CLINICAL DATA:  Concern for osteomyelitis EXAM: MRI THORACIC SPINE WITHOUT CONTRAST TECHNIQUE: Multiplanar, multisequence MR imaging of the thoracic spine was performed. No intravenous contrast was administered. COMPARISON:  MRI of the thoracic spine 08/01/2021 FINDINGS: Evaluation is somewhat limited by motion artifact. Alignment: S shaped curvature of the thoracolumbar spine. No listhesis. Vertebrae: Focally increased T2 signal at the inferior aspect of T9, with up to 20% vertebral body height loss from the inferior endplate (series 18, image 7), which is new since 08/27/2021. Interval extension of previously noted lumbar spinal hardware to the T10 level; susceptibility artifact from the hardware limits evaluation of  these levels. Within this limitation, increased T2 signal is noted about the endplates of D34-534 (series 17, image 6), which appears increased compared to the prior exam, likely edema at the endplates associated with the interval placement of an interbody disc spacer at this level. Cord: Evaluation of the spinal cord in the lower thoracic spine is significantly limited by susceptibility artifact. Within this limitation, the spinal cord is normal in caliber and signal. Paraspinal and other soft tissues: No acute finding. Disc levels: Within the aforementioned limitations, no spinal canal stenosis or significant neural foraminal narrowing is seen, although the neural foramina at the postsurgical levels can not be well evaluated. IMPRESSION: 1. Acute appearing inferior endplate compression fracture at T9, with approximately 20% vertebral  body height loss. 2. Interval extension of previously noted lumbar spinal hardware to the T10 level, with placement of an interbody disc spacer at T12-L1. Although evaluation of the levels is limited by susceptibility, there is increased T2 signal about the endplates at D34-534, likely edema related to the previously noted degenerative changes in the disc spacer placement. Electronically Signed   By: Merilyn Baba M.D.   On: 10/29/2021 23:16   CT ABDOMEN PELVIS W CONTRAST  Result Date: 10/29/2021 CLINICAL DATA:  Abdominal pain, acute, nonlocalized EXAM: CT ABDOMEN AND PELVIS WITH CONTRAST TECHNIQUE: Multidetector CT imaging of the abdomen and pelvis was performed using the standard protocol following bolus administration of intravenous contrast. RADIATION DOSE REDUCTION: This exam was performed according to the departmental dose-optimization program which includes automated exposure control, adjustment of the mA and/or kV according to patient size and/or use of iterative reconstruction technique. CONTRAST:  130mL OMNIPAQUE IOHEXOL 300 MG/ML  SOLN COMPARISON:  None Available. FINDINGS: Lower chest: No acute abnormality Hepatobiliary: No focal liver abnormality is seen. Status post cholecystectomy. No biliary dilatation. Pancreas: No focal abnormality or ductal dilatation. Spleen: No focal abnormality.  Normal size. Adrenals/Urinary Tract: No adrenal abnormality. No focal renal abnormality. No stones or hydronephrosis. Urinary bladder is unremarkable. Stomach/Bowel: There is wall thickening surrounding the colon from the splenic flexure through the descending colon compatible with colitis. No bowel obstruction. Stomach and small bowel decompressed, unremarkable. Vascular/Lymphatic: Aortic atherosclerosis. No evidence of aneurysm or adenopathy. Reproductive: Uterus and adnexa unremarkable.  No mass. Other: No free fluid or free air. Musculoskeletal: No acute bony abnormality. Posterior fusion changes in the  thoracolumbar spine. IMPRESSION: Wall thickening involving the descending colon compatible with colitis. Electronically Signed   By: Rolm Baptise M.D.   On: 10/29/2021 20:17   DG Chest Port 1 View  Result Date: 10/29/2021 CLINICAL DATA:  Near syncope. EXAM: PORTABLE CHEST 1 VIEW COMPARISON:  Two-view chest x-ray 02/09/2010 FINDINGS: Heart size exaggerated by low lung volumes. Atherosclerotic changes are present at the aortic arch. Pulmonary vascular congestion is present without frank edema. No effusions are present. No focal airspace consolidation is present. IMPRESSION: 1. Low lung volumes. 2. Pulmonary vascular congestion without frank edema. Electronically Signed   By: San Morelle M.D.   On: 10/29/2021 13:01   DG C-Arm 1-60 Min-No Report  Result Date: 11/05/2021 Fluoroscopy was utilized by the requesting physician.  No radiographic interpretation.    Microbiology: Results for orders placed or performed during the hospital encounter of 10/29/21  Culture, blood (single)     Status: None   Collection Time: 10/29/21  5:06 PM   Specimen: BLOOD  Result Value Ref Range Status   Specimen Description   Final    BLOOD LEFT ANTECUBITAL Performed at [facility]  [place] [address] [place], [place] [address]    Special Requests   Final    BOTTLES DRAWN AEROBIC AND ANAEROBIC Blood Culture adequate volume Performed at [place] [address] [place], [place] [address]    Culture   Final    NO GROWTH 5 DAYS Performed at [facility], [place] [address] [place], [place] [address]    Report Status 11/03/2021 FINAL  Final  C Difficile Quick Screen w PCR reflex     Status: None   Collection Time: 10/31/21  9:35 AM   Specimen: Stool  Result Value Ref Range Status   C Diff antigen NEGATIVE NEGATIVE Final   C Diff toxin NEGATIVE NEGATIVE Final   C Diff interpretation No C. difficile detected.  Final    Comment: Performed at [facility], [place] [address], [place], [place] [address]  Gastrointestinal Panel by PCR , Stool     Status: None   Collection Time: 10/31/21  9:35 AM   Specimen: Stool  Result Value Ref Range Status   Campylobacter species NOT DETECTED NOT DETECTED Final   Plesimonas shigelloides NOT DETECTED NOT DETECTED Final   Salmonella species NOT DETECTED NOT DETECTED Final   Yersinia enterocolitica NOT DETECTED NOT DETECTED Final   Vibrio species NOT DETECTED NOT DETECTED Final   Vibrio cholerae NOT DETECTED NOT DETECTED Final   Enteroaggregative E coli (EAEC) NOT DETECTED NOT DETECTED Final   Enteropathogenic E coli (EPEC) NOT DETECTED NOT DETECTED Final   Enterotoxigenic E coli (ETEC) NOT DETECTED NOT DETECTED Final   Shiga like toxin producing E coli (STEC) NOT DETECTED NOT DETECTED Final   Shigella/Enteroinvasive E coli (EIEC) NOT DETECTED NOT DETECTED Final   Cryptosporidium NOT DETECTED NOT DETECTED Final   Cyclospora cayetanensis NOT DETECTED NOT DETECTED Final   Entamoeba histolytica NOT DETECTED NOT DETECTED Final   Giardia lamblia NOT DETECTED NOT DETECTED Final   Adenovirus F40/41 NOT DETECTED NOT DETECTED Final   Astrovirus NOT DETECTED NOT DETECTED Final   Norovirus GI/GII NOT DETECTED NOT DETECTED Final   Rotavirus A NOT DETECTED NOT DETECTED Final   Sapovirus (I, II, IV, and V) NOT DETECTED NOT DETECTED Final    Comment: Performed at [facility], [place] [place], [place] [address]    Labs: CBC: Recent Labs  Lab 11/02/21 0329 11/03/21 0826 11/04/21 0410 11/05/21 0354 11/06/21 0111  WBC 11.3* 6.7 6.1 5.5 6.8  NEUTROABS 8.8* 4.3 3.3 2.6 6.0  HGB 9.8* 9.4* 9.5* 9.3* 9.9*  HCT 30.5* 29.1* 29.5* 29.1* 31.1*  MCV 93.8 91.8 92.2 92.4 91.5  PLT 231 240 253 258 [id number]   Basic Metabolic Panel: Recent Labs  Lab 11/02/21 0329 11/03/21 0826 11/04/21 0410 11/05/21 0354 11/06/21 0111  NA 141 140 141 140 136  K 3.0* 3.2* 3.3* 3.5 4.2  CL 109 104 107 108 103   CO2 23 26 28 25 24   GLUCOSE 81 93 107* 99 152*  BUN 6* <5* <5* 7* <5*  CREATININE 0.56 0.45 0.53 0.49 0.49  CALCIUM 8.5* 8.2* 8.3* 8.3* 8.6*  MG 1.7 1.6* 2.0 1.9 1.7  PHOS  --  3.1 3.1 3.4 3.5   Liver Function Tests: Recent Labs  Lab 11/03/21 0826 11/04/21 0410 11/05/21 0354 11/06/21 0111  AST 14* 18 22 42*  ALT 10 12 11 16   ALKPHOS 90 88 80 107  BILITOT 0.6 0.4 0.3 0.2*  PROT 5.6* 5.5* 5.2* 5.8*  ALBUMIN  2.5* 2.6* 2.4* 2.7*   CBG: Recent Labs  Lab 11/05/21 1140  GLUCAP 105*    Discharge time spent: 36 minutes   Signed: Hosie Poisson, MD [facility] 11/08/2021

## 2021-11-08 NOTE — Progress Notes (Addendum)
Physical Therapy Treatment Patient Details Name: [name] MRN: [id number] DOB: July 22, 1940 Today's Date: 11/08/2021   History of Present Illness Pt is an 81 y.o. female with recent herniated nucleus pulposus with severe stenosis T12-L1, s/p T12-L1 PLIF, T10-L3 posterior arthrodesis pedicle fixation on 09/28/21.Pt presented to [facility] ED with complaints of generalized weakness.  Since surgery she has had ongoing back pain, generalized weakness and prior to admission had near syncope at home.  MRI of spine showed a new T9 compression fracture with 20% height loss. Patient also had complaints of abdominal pain for which a CT scan was obtained showing colitis.  6/2 T9 kyphoplasty.PMH includes prior back sxs,bilateral TKA (2011, 2018), HTN, arthritis, anxiety.    PT Comments    Patient making good progress and expected to go to SNF for rehab. Pt required supervision/min guard for bed mobility and cues to complete log roll technique. Min cues for safe transfer technique with sit<>stand to use grab bar in bathroom. No LOB noted throughout. Min assist required to don upper and lower body clothes. She will continue to benefit from skilled PT interventions to address impairments and progress independence with mobility. Continue to recommend ST rehab at [facility]. Acute PT will continue to progress as able.    Recommendations for follow up therapy are one component of a multi-disciplinary discharge planning process, led by the attending physician.  Recommendations may be updated based on patient status, additional functional criteria and insurance authorization.  Follow Up Recommendations  Skilled nursing-short term rehab (<3 hours/day)     Assistance Recommended at Discharge Intermittent Supervision/Assistance  Patient can return home with the following A little help with walking and/or transfers;A little help with bathing/dressing/bathroom;Assistance with cooking/housework;Assist for transportation;Help with  stairs or ramp for entrance   Equipment Recommendations  None recommended by PT    Recommendations for Other Services       Precautions / Restrictions Precautions Precautions: Fall;Back Precaution Comments: per pt MD said brace for ambualtion Required Braces or Orthoses: Spinal Brace Spinal Brace: Lumbar corset;Applied in sitting position Restrictions Weight Bearing Restrictions: No     Mobility  Bed Mobility Overal bed mobility: Needs Assistance Bed Mobility: Sit to Supine Rolling: Supervision Sidelying to sit: Supervision   Sit to supine: Min guard   General bed mobility comments: supervision for safety with log roll to sit up to EOB. min guard for safety with return to supine.    Transfers Overall transfer level: Needs assistance Equipment used: Rolling walker (2 wheels) Transfers: Sit to/from Stand Sit to Stand: Min guard, Supervision           General transfer comment: pt using bil UE to power up from EOB, cues to use grab bar in bathroom to sit and stand at toilet    Ambulation/Gait Ambulation/Gait assistance: Min guard, Supervision Gait Distance (Feet): 30 Feet Assistive device: Rolling walker (2 wheels) Gait Pattern/deviations: Step-through pattern, Decreased stride length Gait velocity: decr     General Gait Details: pt steady overall, no LOB noted and maintained safe proximity to RW with gait including during turns   Social research officer, government Rankin (Stroke Patients Only)       Balance Overall balance assessment: Needs assistance Sitting-balance support: No upper extremity supported, Feet supported Sitting balance-Leahy Scale: Good     Standing balance support: Bilateral upper extremity supported, During functional activity Standing balance-Leahy Scale: Fair  Cognition Arousal/Alertness: Awake/alert Behavior During Therapy: WFL for tasks  assessed/performed Overall Cognitive Status: Within Functional Limits for tasks assessed                                          Exercises      General Comments        Pertinent Vitals/Pain Pain Assessment Pain Assessment: No/denies pain Pain Intervention(s): Monitored during session, Limited activity within patient's tolerance    Home Living                          Prior Function            PT Goals (current goals can now be found in the care plan section) Acute Rehab PT Goals PT Goal Formulation: With patient Time For Goal Achievement: 11/16/21 Potential to Achieve Goals: Good Progress towards PT goals: Progressing toward goals    Frequency    Min 3X/week      PT Plan Current plan remains appropriate    Co-evaluation              AM-PAC PT "6 Clicks" Mobility   Outcome Measure  Help needed turning from your back to your side while in a flat bed without using bedrails?: A Little Help needed moving from lying on your back to sitting on the side of a flat bed without using bedrails?: A Little Help needed moving to and from a bed to a chair (including a wheelchair)?: A Little Help needed standing up from a chair using your arms (e.g., wheelchair or bedside chair)?: A Little Help needed to walk in hospital room?: A Little Help needed climbing 3-5 steps with a railing? : A Lot 6 Click Score: 17    End of Session Equipment Utilized During Treatment: Gait belt Activity Tolerance: Patient tolerated treatment well Patient left: in bed;with call bell/phone within reach;with bed alarm set Nurse Communication: Mobility status PT Visit Diagnosis: Difficulty in walking, not elsewhere classified (R26.2)     Time: 4235-3614 PT Time Calculation (min) (ACUTE ONLY): 28 min  Charges:  $Gait Training: 8-22 mins $Therapeutic Activity: 8-22 mins                     Wynn Maudlin, DPT Acute Rehabilitation Services Office  [phone] Pager [phone]  11/08/21 2:43 PM

## 2021-11-08 NOTE — TOC Transition Note (Signed)
Transition of Care [facility]) - CM/SW Discharge Note   Patient Details  Name: [name] MRN: [id number] Date of Birth: 05-May-1941  [facility] CM/SW Contact:  Tresa Endo Phone Number: 11/08/2021, 10:11 AM   Clinical Narrative:    Patient will DC to: [facility] Anticipated DC date: 11/08/2021 Family notified: Pt Friend  Transport by: Corey Harold   Per MD patient ready for DC to [facility] . RN to call report prior to discharge ([phone]). RN, patient, patient's family, and facility notified of DC. Discharge Summary and FL2 sent to facility. DC packet on chart. Ambulance transport requested for patient.   CSW will sign off for now as social work intervention is no longer needed. Please consult [place] again if new needs arise.       Barriers to Discharge: Continued Medical Work up   Patient Goals and CMS Choice Patient states their goals for this hospitalization and ongoing recovery are:: to go home CMS Medicare.gov Compare Post Acute Care list provided to:: Patient    Discharge Placement                       Discharge Plan and Services   Discharge Planning Services: CM Consult                                 Social Determinants of Health (SDOH) Interventions     Readmission Risk Interventions     View : No data to display.

## 2021-11-08 NOTE — Progress Notes (Signed)
Attempted to call [facility] center to give report but it transferred me to an answering machine and left a message.

## 2021-11-08 NOTE — Progress Notes (Signed)
Patient discharged to [facility] as ordered, transported via [employer]

## 2021-11-19 ENCOUNTER — Emergency Department (HOSPITAL_COMMUNITY)
Admission: EM | Admit: 2021-11-19 | Discharge: 2021-11-19 | Disposition: A | Discharge status: Skilled Nursing Facility | Payer: Medicare Other | Attending: Emergency Medicine | Admitting: Emergency Medicine

## 2021-11-19 ENCOUNTER — Emergency Department (HOSPITAL_COMMUNITY): Payer: Medicare Other

## 2021-11-19 ENCOUNTER — Encounter (HOSPITAL_COMMUNITY): Payer: Self-pay

## 2021-11-19 ENCOUNTER — Other Ambulatory Visit: Payer: Self-pay

## 2021-11-19 DIAGNOSIS — Z79899 Other long term (current) drug therapy: Secondary | ICD-10-CM | POA: Diagnosis not present

## 2021-11-19 DIAGNOSIS — R1011 Right upper quadrant pain: Secondary | ICD-10-CM | POA: Diagnosis not present

## 2021-11-19 DIAGNOSIS — R109 Unspecified abdominal pain: Secondary | ICD-10-CM | POA: Diagnosis present

## 2021-11-19 LAB — CBC WITH DIFFERENTIAL/PLATELET
Abs Immature Granulocytes: 0.02 10*3/uL (ref 0.00–0.07)
Basophils Absolute: 0 10*3/uL (ref 0.0–0.1)
Basophils Relative: 1 %
Eosinophils Absolute: 0 10*3/uL (ref 0.0–0.5)
Eosinophils Relative: 0 %
HCT: 57 % — ABNORMAL HIGH (ref 36.0–46.0)
Hemoglobin: 18.5 g/dL — ABNORMAL HIGH (ref 12.0–15.0)
Immature Granulocytes: 1 %
Lymphocytes Relative: 16 %
Lymphs Abs: 0.7 10*3/uL (ref 0.7–4.0)
MCH: 29.6 pg (ref 26.0–34.0)
MCHC: 32.5 g/dL (ref 30.0–36.0)
MCV: 91.1 fL (ref 80.0–100.0)
Monocytes Absolute: 0.6 10*3/uL (ref 0.1–1.0)
Monocytes Relative: 14 %
Neutro Abs: 3 10*3/uL (ref 1.7–7.7)
Neutrophils Relative %: 68 %
Platelets: 148 10*3/uL — ABNORMAL LOW (ref 150–400)
RBC: 6.26 MIL/uL — ABNORMAL HIGH (ref 3.87–5.11)
RDW: 16.7 % — ABNORMAL HIGH (ref 11.5–15.5)
WBC: 4.4 10*3/uL (ref 4.0–10.5)
nRBC: 0 % (ref 0.0–0.2)

## 2021-11-19 LAB — COMPREHENSIVE METABOLIC PANEL
ALT: 13 U/L (ref 0–44)
AST: 25 U/L (ref 15–41)
Albumin: 3.7 g/dL (ref 3.5–5.0)
Alkaline Phosphatase: 106 U/L (ref 38–126)
Anion gap: 8 (ref 5–15)
BUN: 13 mg/dL (ref 8–23)
CO2: 24 mmol/L (ref 22–32)
Calcium: 9.8 mg/dL (ref 8.9–10.3)
Chloride: 110 mmol/L (ref 98–111)
Creatinine, Ser: 0.86 mg/dL (ref 0.44–1.00)
GFR, Estimated: >60 mL/min (ref >60)
Glucose, Bld: 117 mg/dL — ABNORMAL HIGH (ref 70–99)
Potassium: 3.7 mmol/L (ref 3.5–5.1)
Sodium: 142 mmol/L (ref 135–145)
Total Bilirubin: 0.6 mg/dL (ref 0.3–1.2)
Total Protein: 7.3 g/dL (ref 6.5–8.1)

## 2021-11-19 LAB — CBC
HCT: 33.4 % — ABNORMAL LOW (ref 36.0–46.0)
Hemoglobin: 10.7 g/dL — ABNORMAL LOW (ref 12.0–15.0)
MCH: 30.1 pg (ref 26.0–34.0)
MCHC: 32 g/dL (ref 30.0–36.0)
MCV: 93.8 fL (ref 80.0–100.0)
Platelets: 250 10*3/uL (ref 150–400)
RBC: 3.56 MIL/uL — ABNORMAL LOW (ref 3.87–5.11)
RDW: 15.4 % (ref 11.5–15.5)
WBC: 5.4 10*3/uL (ref 4.0–10.5)
nRBC: 0 % (ref 0.0–0.2)

## 2021-11-19 MED ORDER — ONDANSETRON 8 MG PO TBDP: 8.0000 mg | ORAL_TABLET | Freq: Three times a day (TID) | ORAL | Status: DC | PRN

## 2021-11-19 MED ORDER — ONDANSETRON HCL 4 MG PO TABS
8.0000 mg | ORAL_TABLET | Freq: Three times a day (TID) | ORAL | Status: DC | PRN
Start: 2021-11-19 — End: 2021-11-19

## 2021-11-19 MED ORDER — FENTANYL CITRATE PF 50 MCG/ML IJ SOSY
50.0000 ug | PREFILLED_SYRINGE | INTRAMUSCULAR | Status: DC | PRN
  Administered 2021-11-19: 50 ug via INTRAVENOUS
  Filled 2021-11-19: qty 1

## 2021-11-19 MED ORDER — FENTANYL CITRATE PF 50 MCG/ML IJ SOSY
50.0000 ug | PREFILLED_SYRINGE | Freq: Once | INTRAMUSCULAR | Status: AC
  Administered 2021-11-19: 50 ug via INTRAVENOUS
  Filled 2021-11-19: qty 1

## 2021-11-19 MED ORDER — OXYCODONE-ACETAMINOPHEN 5-325 MG PO TABS
1.0000 | ORAL_TABLET | Freq: Once | ORAL | Status: DC
  Filled 2021-11-19: qty 1

## 2021-11-19 MED ORDER — SODIUM CHLORIDE 0.9 % IV SOLN
8.0000 mg | Freq: Once | INTRAVENOUS | Status: AC
  Administered 2021-11-19: 8 mg via INTRAVENOUS
  Filled 2021-11-19: qty 4

## 2021-11-19 MED ORDER — SODIUM CHLORIDE 0.9 % IV BOLUS
1000.0000 mL | Freq: Once | INTRAVENOUS | Status: AC
  Administered 2021-11-19: 1000 mL via INTRAVENOUS

## 2021-11-19 MED ORDER — IOHEXOL 300 MG/ML  SOLN
100.0000 mL | Freq: Once | INTRAMUSCULAR | Status: AC | PRN
  Administered 2021-11-19: 100 mL via INTRAVENOUS

## 2021-11-19 NOTE — ED Provider Notes (Signed)
[facility]-EMERGENCY DEPT Provider Note   CSN: [id number] Arrival date & time: 11/19/21  1504  History Chief Complaint  Patient presents with   Abdominal Pain   [name] is a 81 y.o. female.  81 year old female presents with continued RUQ pain, described as sharp.  Worsened with movement.  No relieving factors.  Experiencing some nausea but no vomiting.  Denies fever, chills, diarrhea, constipation, hemoptysis, hematuria, or blood in stool. Previously hospitalized with sepsis in late May, CT scan found to have colitis.  She was treated with antibiotics and diet change and is now back at home.  She undergoes physical therapy at [facility], where the medical provider recommended that she return to the ED for a abdominal CT because of her continued pain.  Home Medications Prior to Admission medications   Medication Sig Start Date End Date Taking? Authorizing Provider  amLODipine (NORVASC) 5 MG tablet Take 5 mg by mouth at bedtime.    [provider]  Cholecalciferol (VITAMIN D3) 50 MCG (2000 UT) capsule Take 2,000 Units by mouth daily.    [provider]  docusate sodium (COLACE) 100 MG capsule Take 1 capsule (100 mg total) by mouth 2 (two) times daily. 11/08/21   Kathlen Mody, MD  donepezil (ARICEPT) 5 MG tablet Take 1 tablet by mouth at bedtime.    [provider]  enalapril (VASOTEC) 20 MG tablet Take 20 mg by mouth 2 (two) times daily.    [provider]  estradiol (ESTRACE) 0.5 MG tablet Take one half tablet (0.25 mg) by mouth daily. Patient taking differently: Take 0.5 mg by mouth daily. 07/07/21   Patton Salles, MD  feeding supplement (ENSURE ENLIVE / ENSURE PLUS) LIQD Take 237 mLs by mouth 2 (two) times daily between meals. 11/08/21 02/06/22  Kathlen Mody, MD  gabapentin (NEURONTIN) 300 MG capsule Take 300 mg by mouth daily as needed (Nerve pain). 01/28/20   [provider]  meloxicam (MOBIC) 15 MG tablet  Take 15 mg by mouth daily.    [provider]  methocarbamol (ROBAXIN) 500 MG tablet Take 1 tablet (500 mg total) by mouth every 6 (six) hours as needed for muscle spasms. 10/06/21   Meyran, Tiana Loft, NP  Multiple Vitamin (MULTIVITAMIN WITH MINERALS) TABS tablet Take 1 tablet by mouth daily. 11/08/21   Kathlen Mody, MD  polyethylene glycol (MIRALAX / GLYCOLAX) 17 g packet Take 17 g by mouth daily as needed for mild constipation. 11/08/21   Kathlen Mody, MD  pravastatin (PRAVACHOL) 20 MG tablet Take 20 mg by mouth at bedtime.    [provider]  progesterone (PROMETRIUM) 100 MG capsule Take 1 capsule (100 mg total) by mouth daily. Patient taking differently: Take 100 mg by mouth at bedtime. 07/07/21   Patton Salles, MD  senna (SENOKOT) 8.6 MG TABS tablet Take 1 tablet (8.6 mg total) by mouth 2 (two) times daily. 11/08/21   Kathlen Mody, MD  sertraline (ZOLOFT) 50 MG tablet Take 50 mg by mouth daily.    [provider]     Allergies    Morphine    Review of Systems   Review of Systems  Constitutional:  Negative for chills and fever.  HENT:  Negative for congestion, nosebleeds and rhinorrhea.   Respiratory:  Negative for cough, shortness of breath and wheezing.   Cardiovascular:  Negative for chest pain.  Gastrointestinal:  Positive for abdominal pain and nausea. Negative for abdominal distention, anal bleeding,  blood in stool, diarrhea and vomiting.  Genitourinary:  Negative for hematuria.   Physical Exam Updated Vital Signs BP 137/68 (BP Location: Right Arm)   Pulse 84   Temp 98 F (36.7 C) (Oral)   Resp 17   Ht 5\' 7"  (1.702 m)   Wt 81.9 kg   LMP 06/06/1996   SpO2 98%   BMI 28.28 kg/m  Physical Exam Vitals and nursing note reviewed.  Constitutional:      General: She is in acute distress.     Appearance: She is well-developed and normal weight. She is not ill-appearing or toxic-appearing.  HENT:     Head: Normocephalic.     Mouth/Throat:      Mouth: Mucous membranes are moist.  Eyes:     General: No scleral icterus. Cardiovascular:     Rate and Rhythm: Normal rate and regular rhythm.     Heart sounds: Normal heart sounds. No murmur heard. Pulmonary:     Effort: Pulmonary effort is normal.     Breath sounds: Normal breath sounds.  Abdominal:     General: Bowel sounds are normal. There is no distension.     Palpations: Abdomen is soft. There is no shifting dullness, fluid wave or hepatomegaly.     Tenderness: There is abdominal tenderness in the right upper quadrant.  Skin:    General: Skin is warm.     Capillary Refill: Capillary refill takes less than 2 seconds.  Neurological:     General: No focal deficit present.     Mental Status: She is alert.  Psychiatric:        Mood and Affect: Mood normal.    ED Results / Procedures / Treatments   Labs (all labs ordered are listed, but only abnormal results are displayed) Labs Reviewed - No data to display  EKG None  Radiology No results found.  Procedures Procedures   Medications Ordered in ED Medications  ondansetron (ZOFRAN-ODT) disintegrating tablet 8 mg (has no administration in time range)    Or  ondansetron (ZOFRAN) tablet 8 mg (has no administration in time range)  oxyCODONE-acetaminophen (PERCOCET/ROXICET) 5-325 MG per tablet 1 tablet (has no administration in time range)   ED Course/ Medical Decision Making/ A&P                           Medical Decision Making 9-year female with recent history of colitis presents with continued abdominal pain over the last 3 weeks.  Vital signs stable, moderate relief provided with Zofran and fentanyl.  CBC and CMP unremarkable, no signs of new infection.  CT abdomen with contrast unremarkable, did not show colitis.  No surgical or medical pathologies seen on CT.  Possible side effect of Flagyl, recommend stopping for couple days and discussing this with provider who prescribed it.  Also discussed returning to normal  diet over the next couple weeks.  Discussed with patient, she is amenable.  Return precautions given, see AVS.  Amount and/or Complexity of Data Reviewed Independent Historian: friend External Data Reviewed: labs and radiology. Labs: ordered. Radiology: ordered.  Risk Prescription drug management.   Final Clinical Impression(s) / ED Diagnoses Final diagnoses:  None   Rx / DC Orders ED Discharge Orders     None      08/04/1996, MD   Fayette Pho, MD 11/19/21 11/21/21    Ernestina [place], MD 11/20/21 2246

## 2021-11-19 NOTE — ED Notes (Signed)
Pt called out and advised she had a headache and advised she was in pain. I went into room at 2025 and asked if she needed the pain medication or if she wanted tylenol for her headache. Pt advised she wanted the pain medication. Pain medication given at 2033. Pt also requested a brief and some paper scrubs

## 2021-11-19 NOTE — ED Notes (Signed)
[facility] was called at around 1930 to arrange transport by [facility]

## 2021-11-19 NOTE — ED Triage Notes (Signed)
Pt BIB EMS c/o right abd pain x 3 weeks. Denies n/v

## 2021-11-19 NOTE — Discharge Instructions (Signed)
Your blood work looks stable.  We did not see any signs of new infection.  Your abdominal CT scan was actually called normal.  There was no evidence of inflammation in the colon or any other organs.  We also did not see any surgical or medical emergencies in your abdomen.  Stop the Flagyl for several days and see if this helps your sharp pain.  You may also slowly return to a normal diet over the next couple of weeks.

## 2021-12-22 ENCOUNTER — Other Ambulatory Visit: Payer: Self-pay | Admitting: Obstetrics and Gynecology

## 2021-12-22 NOTE — Telephone Encounter (Signed)
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   I recommend discontinuation of her hormone therapy.   I am not comfortable with her continuing these medications.   The hormone therapy increases her chances of having a stroke, heart attack, and blood clots.  The atherosclerosis is also a risk factor for cardiovascular events.

## 2021-12-22 NOTE — Telephone Encounter (Signed)
07/07/2021 office visit note: "Patient would like to wean off HRT.  She will take estradiol 0.5 mg, 1/2 tablet (0.25 mg) by mouth daily for 3 months and then stop.  #45, RF none.  She will take Prometrium 100 mg by mouth daily for 3 months and then stop.  #90, RF none.    If the patient requests to continue her hormone therapy, she will need to return for an office visit and a breast exam. "  Pharmacy sent refill request and I called patient to confirm this was her wishes.  She said when she talked with Dr. Edward Jolly in Feb she was not quite ready to come off. I read her Dr. Rica Records note that if she wanted to continue HRT she would need to return for OV and breast exam. She is fine with that and I will have someone call her to arrange a visit.  Refill refused.

## 2021-12-23 NOTE — Telephone Encounter (Signed)
Spoke with patient and informed her-reading her Dr. Rica Records reply. Patient was unaware of these CT findings and was very understanding as to why Dr. Edward Jolly did not want her to use HRT any longer.  She wants to follow up with her PCP Dr. Jacky Kindle to see if he wants to refer her to cardiologist. She cancelled the 01/07/22 appt she had scheduled with Dr Edward Jolly.

## 2021-12-27 NOTE — Telephone Encounter (Signed)
Pharmacy sent request today for Progesterone 100 mg refill.  I called them and left message in voice mail to cancel patient's Rx for Estradiol and Progesterone as she will no longer be taking those medications.

## 2022-01-07 ENCOUNTER — Ambulatory Visit: Payer: Medicare Other | Admitting: Obstetrics and Gynecology

## 2022-02-03 DIAGNOSIS — M12812 Other specific arthropathies, not elsewhere classified, left shoulder: Secondary | ICD-10-CM | POA: Insufficient documentation

## 2022-03-28 ENCOUNTER — Other Ambulatory Visit: Payer: Self-pay | Admitting: Family Medicine

## 2022-03-28 DIAGNOSIS — R600 Localized edema: Secondary | ICD-10-CM

## 2022-03-29 ENCOUNTER — Ambulatory Visit
Admission: RE | Admit: 2022-03-29 | Discharge: 2022-03-29 | Disposition: A | Discharge status: Home / Self Care | Payer: Medicare Other | Source: Ambulatory Visit | Attending: Family Medicine | Admitting: Family Medicine

## 2022-03-29 DIAGNOSIS — R600 Localized edema: Secondary | ICD-10-CM

## 2022-04-12 DIAGNOSIS — R600 Localized edema: Secondary | ICD-10-CM | POA: Insufficient documentation

## 2022-09-30 ENCOUNTER — Other Ambulatory Visit: Payer: Self-pay | Admitting: Internal Medicine

## 2022-09-30 DIAGNOSIS — Z1231 Encounter for screening mammogram for malignant neoplasm of breast: Secondary | ICD-10-CM

## 2022-10-03 ENCOUNTER — Ambulatory Visit
Admission: RE | Admit: 2022-10-03 | Discharge: 2022-10-03 | Disposition: A | Discharge status: Home / Self Care | Payer: Medicare Other | Source: Ambulatory Visit | Attending: Internal Medicine | Admitting: Internal Medicine

## 2022-10-03 DIAGNOSIS — Z1231 Encounter for screening mammogram for malignant neoplasm of breast: Secondary | ICD-10-CM

## 2022-12-15 ENCOUNTER — Other Ambulatory Visit: Payer: Self-pay | Admitting: Neurological Surgery

## 2022-12-15 DIAGNOSIS — G959 Disease of spinal cord, unspecified: Secondary | ICD-10-CM

## 2023-01-31 ENCOUNTER — Ambulatory Visit
Admission: RE | Admit: 2023-01-31 | Discharge: 2023-01-31 | Disposition: A | Discharge status: Home / Self Care | Payer: Medicare Other | Source: Ambulatory Visit | Attending: Neurological Surgery | Admitting: Neurological Surgery

## 2023-01-31 DIAGNOSIS — G959 Disease of spinal cord, unspecified: Secondary | ICD-10-CM

## 2023-02-15 ENCOUNTER — Ambulatory Visit: Payer: Medicare Other | Admitting: Podiatry

## 2023-02-15 ENCOUNTER — Encounter: Payer: Self-pay | Admitting: Podiatry

## 2023-02-15 DIAGNOSIS — B351 Tinea unguium: Secondary | ICD-10-CM | POA: Diagnosis not present

## 2023-02-15 DIAGNOSIS — M79675 Pain in left toe(s): Secondary | ICD-10-CM

## 2023-02-15 DIAGNOSIS — M79674 Pain in right toe(s): Secondary | ICD-10-CM | POA: Diagnosis not present

## 2023-02-15 NOTE — Progress Notes (Signed)
  Subjective:  Patient ID: [name], female    DOB: 30-Mar-1941,  MRN: [id number]  Chief Complaint  Patient presents with   Nail Problem    Nail trim    82 y.o. female returns for the above complaint.  Patient presents with thickened elongated dystrophic mycotic toenails x 10 mild pain on palpation hurts with ambulation worse with pressure.  He she would like for me to debride down she is not able to do it herself  Objective:  There were no vitals filed for this visit. Podiatric Exam: Vascular: dorsalis pedis and posterior tibial pulses are palpable bilateral. Capillary return is immediate. Temperature gradient is WNL. Skin turgor WNL  Sensorium: Normal Semmes Weinstein monofilament test. Normal tactile sensation bilaterally. Nail Exam: Pt has thick disfigured discolored nails with subungual debris noted bilateral entire nail hallux through fifth toenails.  Pain on palpation to the nails. Ulcer Exam: There is no evidence of ulcer or pre-ulcerative changes or infection. Orthopedic Exam: Muscle tone and strength are WNL. No limitations in general ROM. No crepitus or effusions noted.  Skin: No Porokeratosis. No infection or ulcers    Assessment & Plan:   1. Pain due to onychomycosis of toenails of both feet     Patient was evaluated and treated and all questions answered.  Onychomycosis with pain  -Nails palliatively debrided as below. -Educated on self-care  Procedure: Nail Debridement Rationale: pain  Type of Debridement: manual, sharp debridement. Instrumentation: Nail nipper, rotary burr. Number of Nails: 10  Procedures and Treatment: Consent by patient was obtained for treatment procedures. The patient understood the discussion of treatment and procedures well. All questions were answered thoroughly reviewed. Debridement of mycotic and hypertrophic toenails, 1 through 5 bilateral and clearing of subungual debris. No ulceration, no infection noted.  Return Visit-Office  Procedure: Patient instructed to return to the office for a follow up visit 3 months for continued evaluation and treatment.  Nicholes Rough, DPM    Return in about 3 months (around 05/17/2023) for RFC.

## 2023-05-01 ENCOUNTER — Other Ambulatory Visit (HOSPITAL_COMMUNITY): Payer: Self-pay | Admitting: Internal Medicine

## 2023-05-01 DIAGNOSIS — I350 Nonrheumatic aortic (valve) stenosis: Secondary | ICD-10-CM

## 2023-05-17 ENCOUNTER — Ambulatory Visit: Payer: Medicare Other | Admitting: Podiatry

## 2023-05-24 ENCOUNTER — Ambulatory Visit: Payer: Medicare Other | Admitting: Podiatry

## 2023-06-02 ENCOUNTER — Ambulatory Visit (HOSPITAL_COMMUNITY): Payer: Medicare Other | Attending: Internal Medicine

## 2023-06-02 DIAGNOSIS — I350 Nonrheumatic aortic (valve) stenosis: Secondary | ICD-10-CM | POA: Insufficient documentation

## 2023-06-02 LAB — ECHOCARDIOGRAM COMPLETE
AR max vel: 1.23 cm2
AV Area VTI: 1.31 cm2
AV Area mean vel: 1.25 cm2
AV Mean grad: 14 mm[Hg]
AV Peak grad: 24 mm[Hg]
Ao pk vel: 2.45 m/s
Area-P 1/2: 2.62 cm2
S' Lateral: 2.4 cm

## 2023-06-20 ENCOUNTER — Ambulatory Visit: Payer: Medicare Other | Admitting: Podiatry

## 2023-06-20 ENCOUNTER — Encounter: Payer: Self-pay | Admitting: Podiatry

## 2023-06-20 DIAGNOSIS — M79674 Pain in right toe(s): Secondary | ICD-10-CM | POA: Diagnosis not present

## 2023-06-20 DIAGNOSIS — M79675 Pain in left toe(s): Secondary | ICD-10-CM

## 2023-06-20 DIAGNOSIS — M542 Cervicalgia: Secondary | ICD-10-CM | POA: Insufficient documentation

## 2023-06-20 DIAGNOSIS — I350 Nonrheumatic aortic (valve) stenosis: Secondary | ICD-10-CM | POA: Insufficient documentation

## 2023-06-20 DIAGNOSIS — G959 Disease of spinal cord, unspecified: Secondary | ICD-10-CM | POA: Insufficient documentation

## 2023-06-20 DIAGNOSIS — M7918 Myalgia, other site: Secondary | ICD-10-CM | POA: Insufficient documentation

## 2023-06-20 DIAGNOSIS — M5104 Intervertebral disc disorders with myelopathy, thoracic region: Secondary | ICD-10-CM | POA: Insufficient documentation

## 2023-06-20 DIAGNOSIS — M353 Polymyalgia rheumatica: Secondary | ICD-10-CM | POA: Insufficient documentation

## 2023-06-20 DIAGNOSIS — S22089A Unspecified fracture of T11-T12 vertebra, initial encounter for closed fracture: Secondary | ICD-10-CM | POA: Insufficient documentation

## 2023-06-20 DIAGNOSIS — B351 Tinea unguium: Secondary | ICD-10-CM

## 2023-06-20 DIAGNOSIS — Z800 Family history of malignant neoplasm of digestive organs: Secondary | ICD-10-CM | POA: Insufficient documentation

## 2023-06-20 NOTE — Progress Notes (Signed)
  Subjective:  Patient ID: [name], female    DOB: Jan 26, 1941,  MRN: [id number]  83 y.o. female presents painful thick toenails that are difficult to trim. Pain interferes with ambulation. Aggravating factors include wearing enclosed shoe gear. Pain is relieved with periodic professional debridement. Chief Complaint  Patient presents with   Nail Problem    RFC patient last seen PCP 6 to 7 weeks ago .   New problem(s): None   PCP is Shepard Ade, MD , and last visit was May 01, 2023.  Allergies  Allergen Reactions   Morphine  Other (See Comments)    vomiting   Review of Systems: Negative except as noted in the HPI.   Objective:  OCTA [name] is a pleasant 83 y.o. female WD, WN in NAD. AAO x 3.  Vascular Examination: Vascular status intact b/l with palpable pedal pulses. CFT immediate b/l. Pedal hair present. No edema. No pain with calf compression b/l. Skin temperature gradient WNL b/l. No varicosities noted. No cyanosis or clubbing noted.  Neurological Examination: Sensation grossly intact b/l with 10 gram monofilament. Vibratory sensation intact b/l.  Dermatological Examination: Pedal skin with normal turgor, texture and tone b/l. No open wounds nor interdigital macerations noted. Toenails 1-5 b/l thick, discolored, elongated with subungual debris and pain on dorsal palpation. No hyperkeratotic lesions noted b/l.   Musculoskeletal Examination: Muscle strength 5/5 to b/l LE.  Utilizes cane for ambulation assistance.  Radiographs: None   Assessment:   1. Pain due to onychomycosis of toenails of both feet    Plan:  Patient was evaluated and treated. All patient's and/or POA's questions/concerns addressed on today's visit. Toenails 1-5 debrided in length and girth without incident. Continue soft, supportive shoe gear daily. Report any pedal injuries to medical professional. Call office if there are any questions/concerns. -Patient/POA to call should there be  question/concern in the interim.  Return in about 3 months (around 09/18/2023).  Delon LITTIE Merlin, DPM      [place] LOCATION: [address] [place] [address]                   Office [phone]   [facility] LOCATION: [address] [place], [place] [address] Office [phone]

## 2023-10-04 ENCOUNTER — Ambulatory Visit (INDEPENDENT_AMBULATORY_CARE_PROVIDER_SITE_OTHER): Payer: Medicare Other | Admitting: Podiatry

## 2023-10-04 DIAGNOSIS — Z91198 Patient's noncompliance with other medical treatment and regimen for other reason: Secondary | ICD-10-CM

## 2023-10-05 NOTE — Progress Notes (Signed)
 1. Failure to attend appointment with reason given    Appointment canceled by patient.

## 2023-10-11 ENCOUNTER — Ambulatory Visit: Admitting: Podiatry

## 2023-10-11 ENCOUNTER — Encounter: Payer: Self-pay | Admitting: Podiatry

## 2023-10-11 VITALS — Ht 67.0 in | Wt 180.6 lb

## 2023-10-11 DIAGNOSIS — M79675 Pain in left toe(s): Secondary | ICD-10-CM | POA: Diagnosis not present

## 2023-10-11 DIAGNOSIS — B351 Tinea unguium: Secondary | ICD-10-CM

## 2023-10-11 DIAGNOSIS — M79674 Pain in right toe(s): Secondary | ICD-10-CM

## 2023-10-15 NOTE — Progress Notes (Signed)
  Subjective:  Patient ID: [name], female    DOB: [birth year],  MRN: [id number]  83 y.o. female presents painful elongated mycotic toenails 1-5 bilaterally which are tender when wearing enclosed shoe gear. Pain is relieved with periodic professional debridement.  Chief Complaint  Patient presents with   Nail Problem    Pt is here for [facility] PCP is Dr Delorise Few was a few days ago.   New problem(s): None   PCP is Suan Elm, MD.  Allergies  Allergen Reactions   Morphine  Other (See Comments)    vomiting    Review of Systems: Negative except as noted in the HPI.   Objective:  [name] is a pleasant 83 y.o. female WD, WN in NAD. AAO x 3.  Vascular Examination: Vascular status intact b/l with palpable pedal pulses. CFT immediate b/l. Pedal hair present. No edema. No pain with calf compression b/l. Skin temperature gradient WNL b/l. No varicosities noted. No cyanosis or clubbing noted.  Neurological Examination: Sensation grossly intact b/l with 10 gram monofilament. Vibratory sensation intact b/l.  Dermatological Examination: Pedal skin with normal turgor, texture and tone b/l. No open wounds nor interdigital macerations noted. Toenails 1-5 b/l thick, discolored, elongated with subungual debris and pain on dorsal palpation. No hyperkeratotic lesions noted b/l.   Musculoskeletal Examination: Muscle strength 5/5 to b/l LE.  No pain, crepitus noted b/l. No gross pedal deformities.  Radiographs: None  Last A1c:       No data to display           Assessment:   1. Pain due to onychomycosis of toenails of both feet     Plan:  Patient was evaluated and treated. All patient's and/or POA's questions/concerns addressed on today's visit. Mycotic toenails 1-5 debrided in length and girth without incident. Continue soft, supportive shoe gear daily. Report any pedal injuries to medical professional. Call office if there are any quesitons/concerns. -Patient/POA to call  should there be question/concern in the interim.  Return in about 3 months (around 01/11/2024).  Luella Sager, DPM      [place] LOCATION: [address], [place] [address]                   Office [phone]   [facility] LOCATION: [address] [place], [place] [address] Office [phone]

## 2023-10-18 ENCOUNTER — Other Ambulatory Visit: Payer: Self-pay | Admitting: Internal Medicine

## 2023-10-18 DIAGNOSIS — Z1231 Encounter for screening mammogram for malignant neoplasm of breast: Secondary | ICD-10-CM

## 2023-10-20 ENCOUNTER — Ambulatory Visit
Admission: RE | Admit: 2023-10-20 | Discharge: 2023-10-20 | Disposition: A | Discharge status: Home / Self Care | Source: Ambulatory Visit | Attending: Internal Medicine | Admitting: Internal Medicine

## 2023-10-20 DIAGNOSIS — Z1231 Encounter for screening mammogram for malignant neoplasm of breast: Secondary | ICD-10-CM

## 2024-01-17 ENCOUNTER — Encounter: Payer: Self-pay | Admitting: Podiatry

## 2024-01-17 ENCOUNTER — Ambulatory Visit: Admitting: Podiatry

## 2024-01-17 DIAGNOSIS — B351 Tinea unguium: Secondary | ICD-10-CM | POA: Diagnosis not present

## 2024-01-17 DIAGNOSIS — M79675 Pain in left toe(s): Secondary | ICD-10-CM

## 2024-01-17 DIAGNOSIS — M79674 Pain in right toe(s): Secondary | ICD-10-CM | POA: Diagnosis not present

## 2024-01-23 NOTE — Progress Notes (Signed)
  Subjective:  Patient ID: [name], female    DOB: 1941/03/25,  MRN: [id number]  [name] presents to clinic today for painful thick toenails that are difficult to trim. Pain interferes with ambulation. Aggravating factors include wearing enclosed shoe gear. Pain is relieved with periodic professional debridement.  Chief Complaint  Patient presents with   RFC    RFC Non diabetic toenail trim. LOV with PCP 10/09/23   New problem(s): None.   PCP is Shepard Ade, MD.  Allergies  Allergen Reactions   Amlodipine  Besylate Swelling   Morphine  Other (See Comments)    vomiting    Review of Systems: Negative except as noted in the HPI.  Objective: No changes noted in today's physical examination. There were no vitals filed for this visit. [name] is a pleasant 83 y.o. female WD, WN in NAD. AAO x 3.  Vascular Examination: Capillary refill time immediate b/l. Vascular status intact b/l with palpable pedal pulses. Pedal hair present b/l. No pain with calf compression b/l. Skin temperature gradient WNL b/l. No cyanosis or clubbing b/l. No ischemia or gangrene noted b/l. No edema noted b/l LE.  Neurological Examination: Sensation grossly intact b/l with 10 gram monofilament. Vibratory sensation intact b/l.   Dermatological Examination: Pedal skin with normal turgor, texture and tone b/l.  No open wounds. No interdigital macerations.   Toenails 1-5 b/l thick, discolored, elongated with subungual debris and pain on dorsal palpation.   No corns, calluses, nor porokeratotic lesions.  Musculoskeletal Examination: Muscle strength 5/5 to all lower extremity muscle groups bilaterally. No pain, crepitus or joint limitation noted with ROM b/l LE. No gross bony pedal deformities b/l. Patient ambulates with cane assistance.  Radiographs: None  Assessment/Plan: 1. Pain due to onychomycosis of toenails of both feet   Consent given for treatment. Patient examined. All patient's  and/or POA's questions/concerns addressed on today's visit. Mycotic toenails 1-5 debrided in length and girth without incident. Continue soft, supportive shoe gear daily. Report any pedal injuries to medical professional. Call office if there are any quesitons/concerns. -Patient/POA to call should there be question/concern in the interim.   Return in about 3 months (around 04/18/2024).  Delon LITTIE Merlin, DPM      [place] LOCATION: [address] [place] [address]                   Office [phone]   [facility] LOCATION: [address] [place], [place] [address] Office [phone]

## 2024-05-07 ENCOUNTER — Encounter: Payer: Self-pay | Admitting: Podiatry

## 2024-05-07 ENCOUNTER — Ambulatory Visit: Admitting: Podiatry

## 2024-05-07 DIAGNOSIS — B351 Tinea unguium: Secondary | ICD-10-CM | POA: Diagnosis not present

## 2024-05-07 DIAGNOSIS — M79674 Pain in right toe(s): Secondary | ICD-10-CM

## 2024-05-07 DIAGNOSIS — M79675 Pain in left toe(s): Secondary | ICD-10-CM | POA: Diagnosis not present

## 2024-05-12 NOTE — Progress Notes (Signed)
  Subjective:  Patient ID: [name], female    DOB: 1940/06/08,  MRN: [id number]  [name] presents to clinic today for painful mycotic toenails of both feet that are difficult to trim. Pain interferes with daily activities and wearing enclosed shoe gear comfortably.  Chief Complaint  Patient presents with   Nail Problem    Thick painful toenails, 3 month follow up    New problem(s): None.   PCP is Shepard Ade, MD. [name] 05/06/24.  Allergies  Allergen Reactions   Amlodipine  Besylate Swelling   Morphine  Other (See Comments)    vomiting   Review of Systems: Negative except as noted in the HPI.  Objective: No changes noted in today's physical examination. There were no vitals filed for this visit. [name] is a pleasant 83 y.o. female WD, WN in NAD. AAO x 3.  Vascular Examination: Capillary refill time immediate b/l. Vascular status intact b/l with palpable pedal pulses. Pedal hair present b/l. No pain with calf compression b/l. Skin temperature gradient WNL b/l. No cyanosis or clubbing b/l. No ischemia or gangrene noted b/l. No edema noted b/l LE.  Neurological Examination: Sensation grossly intact b/l with 10 gram monofilament. Vibratory sensation intact b/l.   Dermatological Examination: Pedal skin with normal turgor, texture and tone b/l.  No open wounds. No interdigital macerations.   Toenails 1-5 b/l thick, discolored, elongated with subungual debris and pain on dorsal palpation.   No corns, calluses, nor porokeratotic lesions.  Musculoskeletal Examination: Muscle strength 5/5 to all lower extremity muscle groups bilaterally. No pain, crepitus or joint limitation noted with ROM b/l LE. No gross bony pedal deformities b/l. Patient ambulates with cane assistance.  Radiographs: None  Assessment/Plan: 1. Pain due to onychomycosis of toenails of both feet   Patient was evaluated and treated. All patient's and/or POA's questions/concerns addressed on  today's visit. Toenails 1-5 b/l debrided in length and girth without incident. Continue foot and shoe inspections daily. Monitor blood glucose per PCP/Endocrinologist's recommendations. Continue soft, supportive shoe gear daily. Report any pedal injuries to medical professional. Call office if there are any questions/concerns. -Patient/POA to call should there be question/concern in the interim.   Return in about 3 months (around 08/05/2024).  Delon LITTIE Merlin, DPM      [place] LOCATION: [address] [place] [address]                   Office [phone]   [facility] LOCATION: [address] [place], [place] [address] Office [phone]

## 2024-08-21 ENCOUNTER — Ambulatory Visit: Admitting: Podiatry
